# Patient Record
Sex: Female | Born: 1970
Health system: Southern US, Community
[De-identification: ages and names within clinical notes are randomized; demographics above are authoritative.]

## PROBLEM LIST (undated history)

## (undated) DIAGNOSIS — Z9889 Other specified postprocedural states: Secondary | ICD-10-CM

## (undated) DIAGNOSIS — F988 Other specified behavioral and emotional disorders with onset usually occurring in childhood and adolescence: Secondary | ICD-10-CM

## (undated) DIAGNOSIS — F419 Anxiety disorder, unspecified: Secondary | ICD-10-CM

## (undated) DIAGNOSIS — R112 Nausea with vomiting, unspecified: Secondary | ICD-10-CM

## (undated) DIAGNOSIS — C801 Malignant (primary) neoplasm, unspecified: Secondary | ICD-10-CM

## (undated) DIAGNOSIS — Z8619 Personal history of other infectious and parasitic diseases: Secondary | ICD-10-CM

## (undated) DIAGNOSIS — K219 Gastro-esophageal reflux disease without esophagitis: Secondary | ICD-10-CM

## (undated) DIAGNOSIS — D649 Anemia, unspecified: Secondary | ICD-10-CM

## (undated) DIAGNOSIS — F32A Depression, unspecified: Secondary | ICD-10-CM

## (undated) DIAGNOSIS — F411 Generalized anxiety disorder: Secondary | ICD-10-CM

## (undated) HISTORY — DX: Other specified behavioral and emotional disorders with onset usually occurring in childhood and adolescence: F98.8

## (undated) HISTORY — PX: BACK SURGERY: SHX140

## (undated) HISTORY — DX: Gastro-esophageal reflux disease without esophagitis: K21.9

## (undated) HISTORY — PX: LAMINECTOMY: SHX219

## (undated) HISTORY — DX: Personal history of other infectious and parasitic diseases: Z86.19

## (undated) HISTORY — PX: IR GUIDED LIVER TUMOR ABLATION RFA PERC: IMG933

---
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
## 2001-07-26 ENCOUNTER — Emergency Department (HOSPITAL_COMMUNITY): Admission: EM | Admit: 2001-07-26 | Discharge: 2001-07-26 | Payer: Self-pay | Admitting: Emergency Medicine

## 2001-09-11 ENCOUNTER — Other Ambulatory Visit: Admission: RE | Admit: 2001-09-11 | Discharge: 2001-09-11 | Payer: Self-pay | Admitting: Obstetrics and Gynecology

## 2002-04-28 ENCOUNTER — Other Ambulatory Visit: Admission: RE | Admit: 2002-04-28 | Discharge: 2002-04-28 | Payer: Self-pay | Admitting: Obstetrics and Gynecology

## 2002-11-20 ENCOUNTER — Inpatient Hospital Stay (HOSPITAL_COMMUNITY): Admission: AD | Admit: 2002-11-20 | Discharge: 2002-11-20 | Payer: Self-pay | Admitting: *Deleted

## 2002-12-09 ENCOUNTER — Inpatient Hospital Stay (HOSPITAL_COMMUNITY): Admission: AD | Admit: 2002-12-09 | Discharge: 2002-12-11 | Payer: Self-pay | Admitting: Obstetrics and Gynecology

## 2002-12-12 ENCOUNTER — Encounter: Admission: RE | Admit: 2002-12-12 | Discharge: 2003-01-11 | Payer: Self-pay | Admitting: Obstetrics and Gynecology

## 2003-01-21 ENCOUNTER — Other Ambulatory Visit: Admission: RE | Admit: 2003-01-21 | Discharge: 2003-01-21 | Payer: Self-pay | Admitting: Obstetrics and Gynecology

## 2003-02-10 ENCOUNTER — Encounter: Admission: RE | Admit: 2003-02-10 | Discharge: 2003-03-12 | Payer: Self-pay | Admitting: Obstetrics and Gynecology

## 2004-11-13 ENCOUNTER — Inpatient Hospital Stay (HOSPITAL_COMMUNITY): Admission: AD | Admit: 2004-11-13 | Discharge: 2004-11-15 | Payer: Self-pay | Admitting: Obstetrics & Gynecology

## 2005-08-28 ENCOUNTER — Inpatient Hospital Stay (HOSPITAL_COMMUNITY): Admission: AD | Admit: 2005-08-28 | Discharge: 2005-08-30 | Payer: Self-pay | Admitting: Obstetrics and Gynecology

## 2006-08-25 ENCOUNTER — Emergency Department (HOSPITAL_COMMUNITY): Admission: EM | Admit: 2006-08-25 | Discharge: 2006-08-25 | Payer: Self-pay | Admitting: Emergency Medicine

## 2006-08-31 ENCOUNTER — Ambulatory Visit (HOSPITAL_COMMUNITY): Admission: RE | Admit: 2006-08-31 | Discharge: 2006-08-31 | Payer: Self-pay | Admitting: Family Medicine

## 2006-09-17 ENCOUNTER — Ambulatory Visit (HOSPITAL_COMMUNITY): Admission: RE | Admit: 2006-09-17 | Discharge: 2006-09-18 | Payer: Self-pay | Admitting: Neurosurgery

## 2006-09-17 HISTORY — PX: LUMBAR LAMINECTOMY/DECOMPRESSION WITH DISCECTOMY: SHX7439

## 2007-09-01 ENCOUNTER — Emergency Department (HOSPITAL_COMMUNITY): Admission: EM | Admit: 2007-09-01 | Discharge: 2007-09-01 | Payer: Self-pay | Admitting: Family Medicine

## 2009-09-28 ENCOUNTER — Emergency Department (HOSPITAL_COMMUNITY): Admission: EM | Admit: 2009-09-28 | Discharge: 2009-09-28 | Payer: Self-pay | Admitting: Emergency Medicine

## 2009-11-06 HISTORY — PX: LAMINECTOMY AND MICRODISCECTOMY LUMBAR SPINE: SHX1913

## 2009-11-23 ENCOUNTER — Inpatient Hospital Stay (HOSPITAL_COMMUNITY): Admission: RE | Admit: 2009-11-23 | Discharge: 2009-11-23 | Payer: Self-pay | Admitting: Obstetrics

## 2010-02-16 ENCOUNTER — Encounter: Admission: RE | Admit: 2010-02-16 | Discharge: 2010-02-16 | Payer: Self-pay | Admitting: Neurosurgery

## 2010-03-29 ENCOUNTER — Encounter: Admission: RE | Admit: 2010-03-29 | Discharge: 2010-03-29 | Payer: Self-pay | Admitting: Neurosurgery

## 2010-07-25 IMAGING — CR DG LUMBAR SPINE 2-3V
3 series · 3 of 3 positions shown · non-contrast
Comparison: MRI lumbar spine of 02/16/2010

CLINICAL DATA: Severe low back and bilateral leg pain

LUMBAR SPINE - 2-3 VIEW

[w l-spine lat * (1 of 3)]
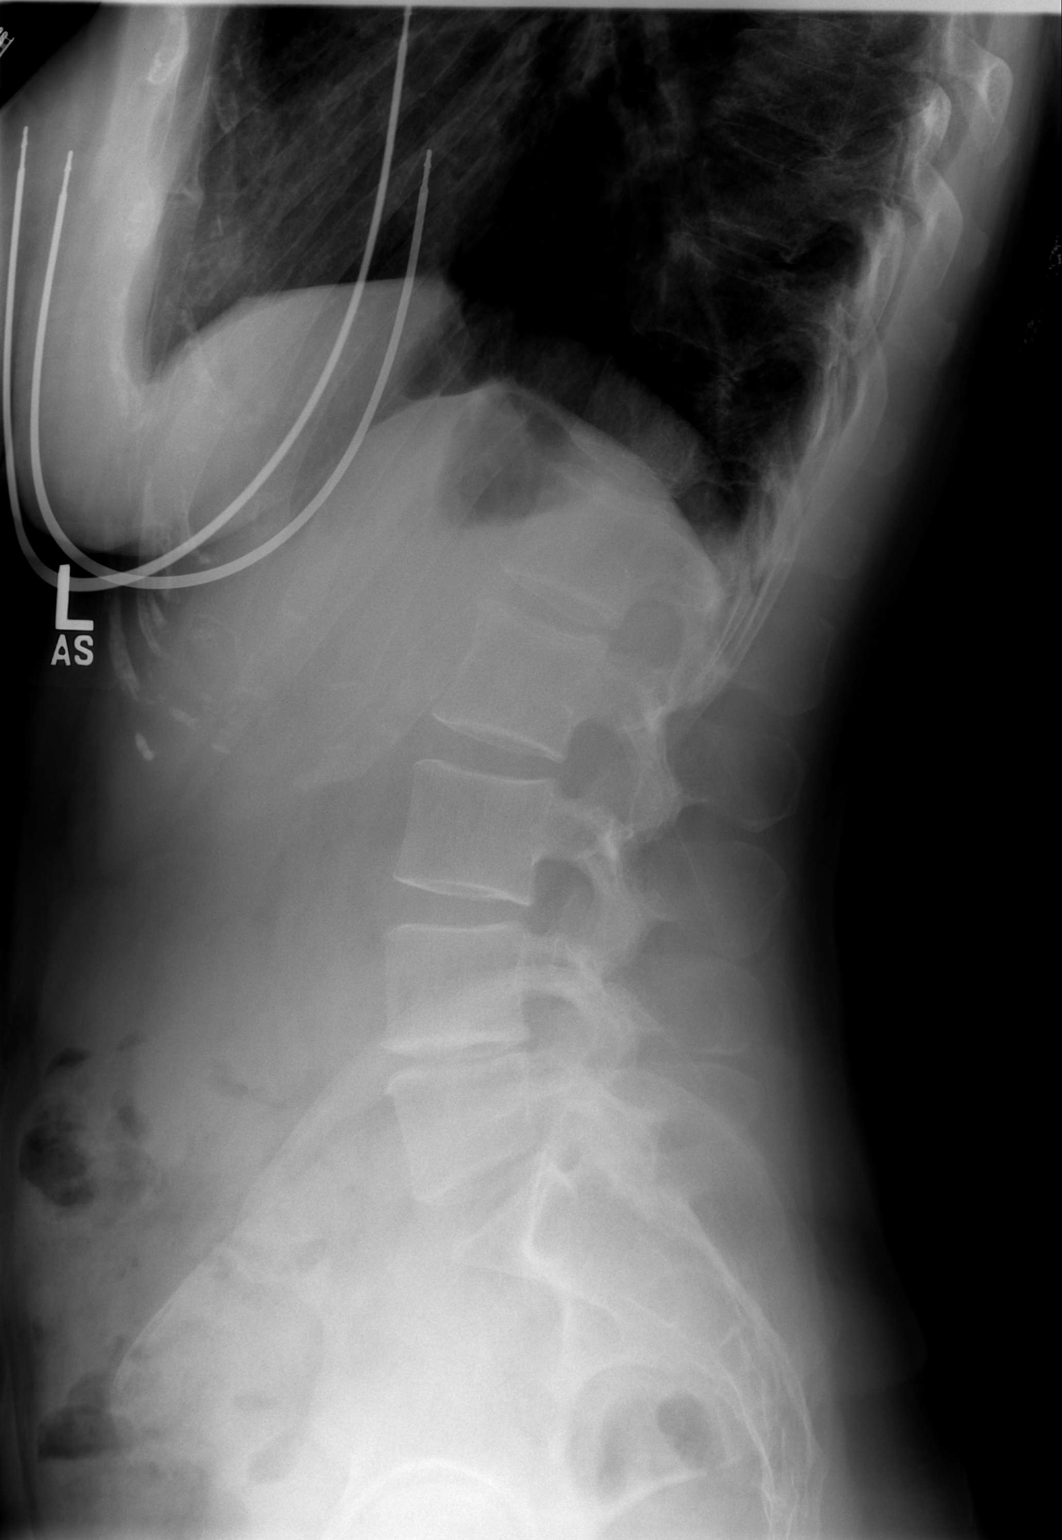

[w l-spine lat * (2 of 3)]
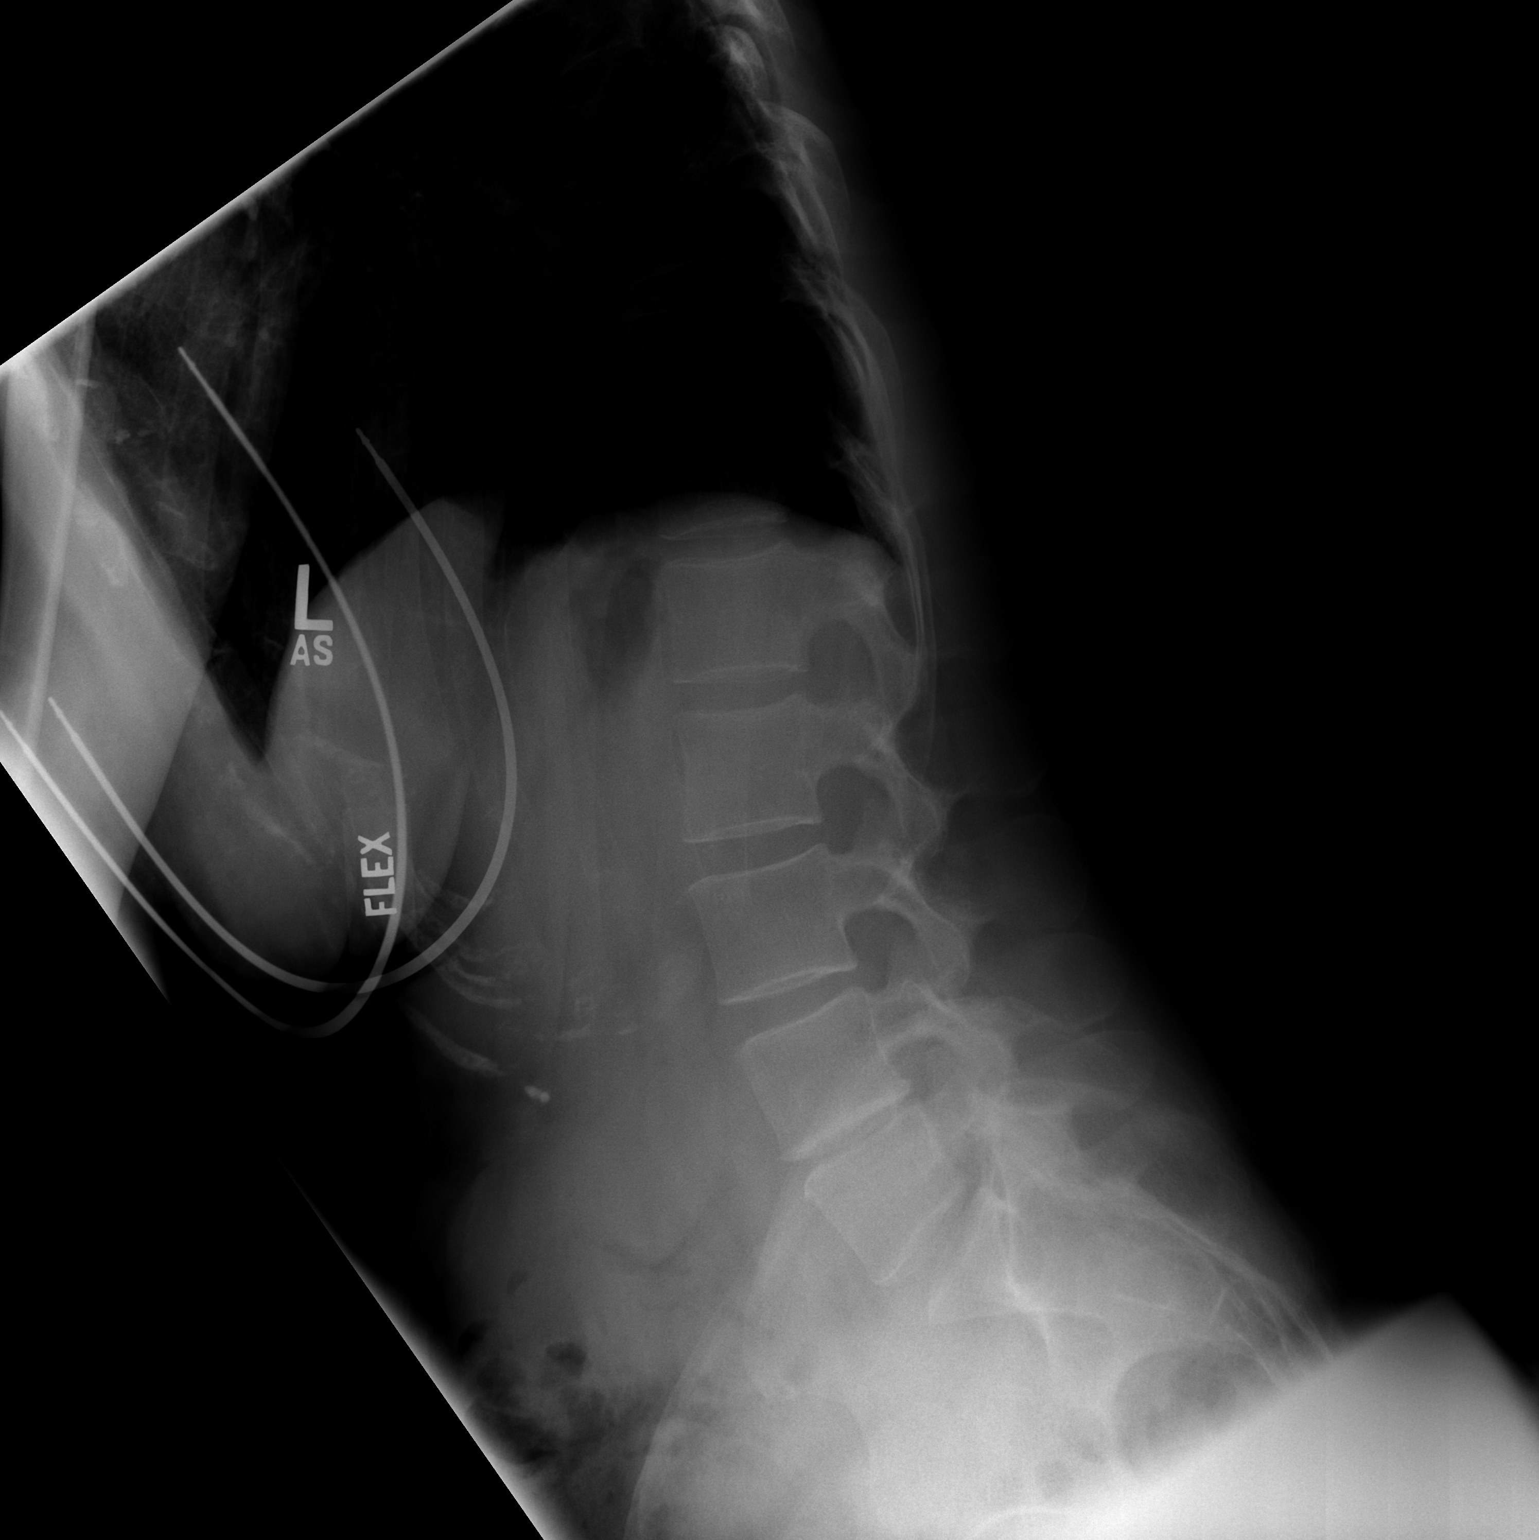

[w l-spine lat * (3 of 3)]
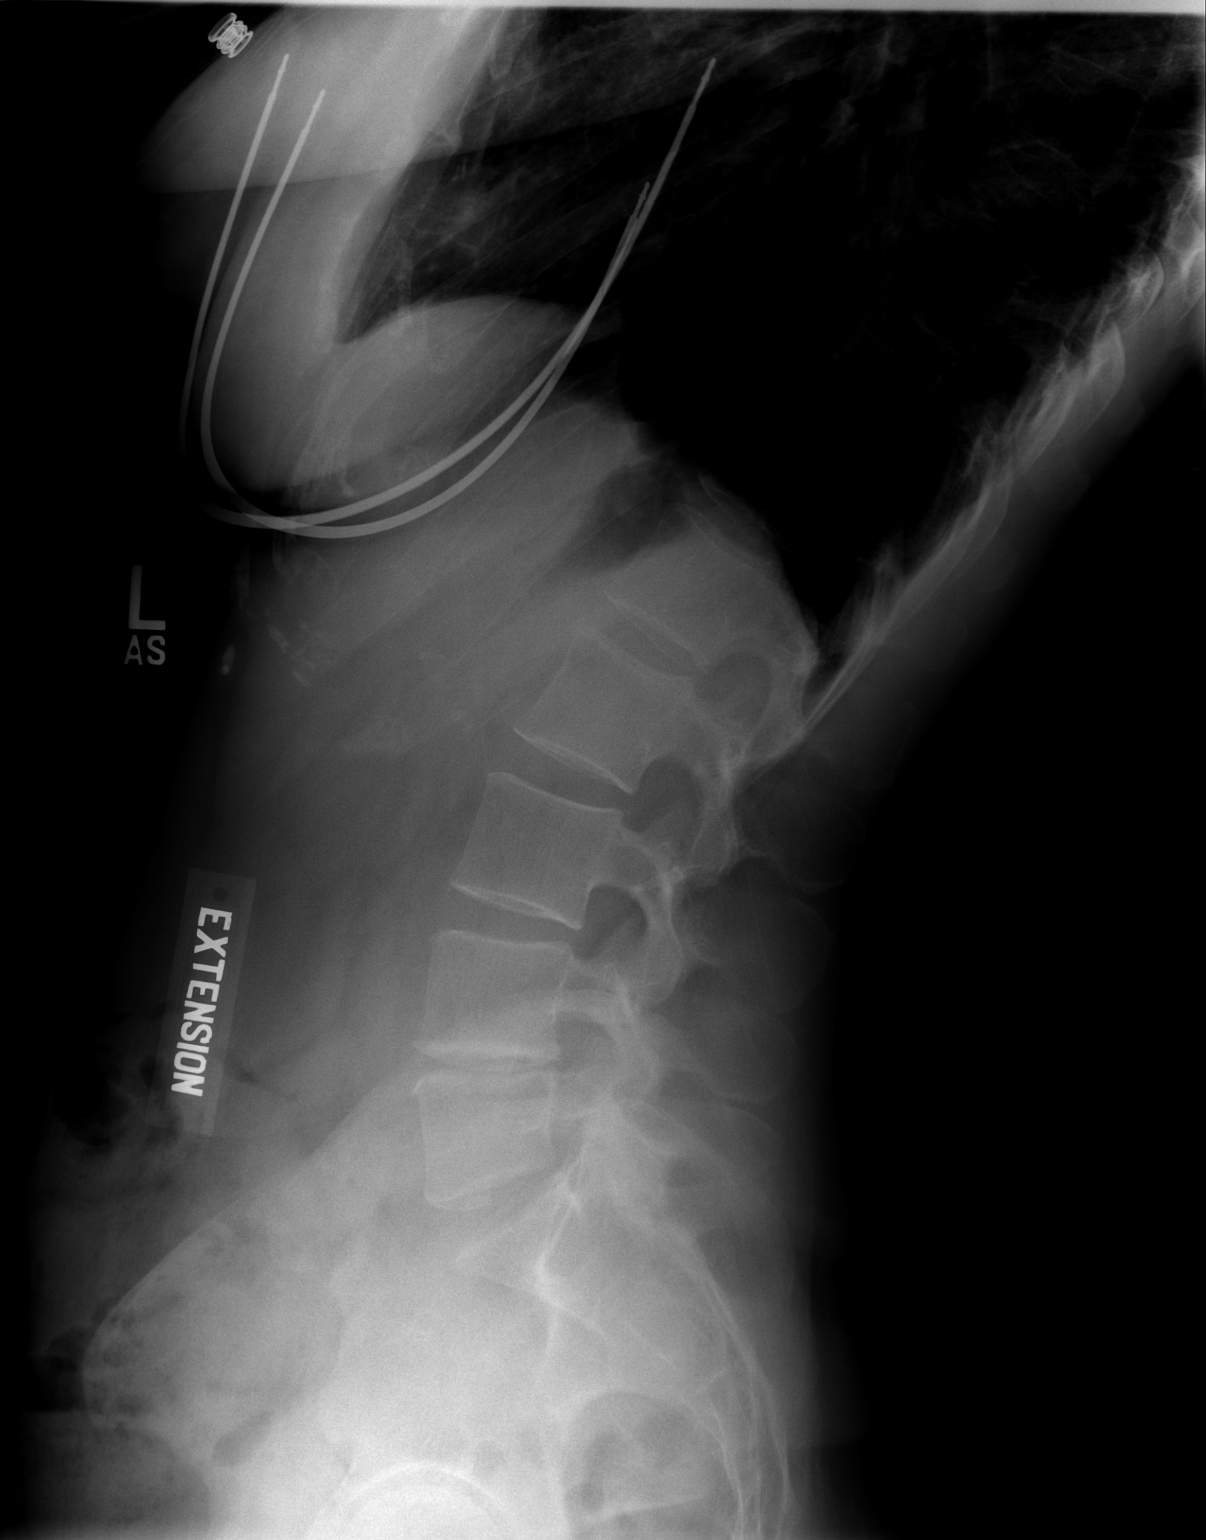

[3 of 3 positions shown; findings below may reference images not displayed]

FINDINGS: The lumbar vertebrae are in normal alignment.  There is
degenerative disc disease at L4-5 with loss of disc space,
sclerosis, and spurring.  The remainder of disc spaces appear
normal.  Through flexion and extension there is limited range of
motion.
IMPRESSION: 1.  Degenerative disc disease at L4-5.
2.  Limited range of motion through flexion and extension.  Normal
alignment.

## 2010-11-26 ENCOUNTER — Encounter: Payer: Self-pay | Admitting: Family Medicine

## 2011-01-22 LAB — URINALYSIS, ROUTINE W REFLEX MICROSCOPIC
Bilirubin Urine: NEGATIVE
Glucose, UA: NEGATIVE mg/dL
Ketones, ur: 15 mg/dL — AB
Leukocytes, UA: NEGATIVE
Nitrite: NEGATIVE
Protein, ur: NEGATIVE mg/dL
Specific Gravity, Urine: 1.01 (ref 1.005–1.030)
Urobilinogen, UA: 0.2 mg/dL (ref 0.0–1.0)
pH: 7 (ref 5.0–8.0)

## 2011-01-22 LAB — URINE MICROSCOPIC-ADD ON

## 2011-01-22 LAB — POCT PREGNANCY, URINE: Preg Test, Ur: NEGATIVE

## 2011-03-24 NOTE — Op Note (Signed)
NAME:  Ann Daugherty, Ann Daugherty NO.:  [id number]   MEDICAL RECORD NO.:  [id number]          PATIENT TYPE:  OIB   LOCATION:  3031                         FACILITY:  [facility]   PHYSICIAN:  Donalee Citrin, M.D.        DATE OF BIRTH:  29-Jun-1971   DATE OF PROCEDURE:  09/17/2006  DATE OF DISCHARGE:  09/18/2006                                 OPERATIVE REPORT   PREOPERATIVE DIAGNOSIS:  L5 radiculopathy from ruptured disk, L4-5 right.   POSTOPERATIVE DIAGNOSIS:  L5 radiculopathy from ruptured disk, L4-5 right.   PROCEDURE:  Lumbar laminectomy with microdiskectomy, L4-5 right with  microscopic dissection of the right L5 nerve root with microscopic  diskectomy.   SURGEON:  Donalee Citrin, MD   ANESTHESIA:  General endotracheal.   OPERATIVE INDICATIONS:  This patient is a very pleasant 40 year old female  who has had back and right leg pain progressively worsening over the last  several weeks with radiation down the posterior aspect of her right thigh  into the top of the foot and big toe with weakness on her right EHL.  The  patient had been through two courses of oral steroids with only temporary  relief and the patient had persistent weakness of the right big toe and was  recommended subsequent microdiskectomy after the MRI scan showed a very  large disk herniation causing severe compression of the right L5 nerve root.  Risks and benefits of the operation were explained to the patient and she  understands and agrees to proceed.   DESCRIPTION OF PROCEDURE:  The patient was brought to the operating room  where after induction of general anesthesia, the patient was positioned  prone on the Wilson frame, back scrubbed and draped in usual sterile  fashion.  On preoperative x-ray, I localized the L4 spinous process so after  infiltration of 10 mL of lidocaine with epinephrine, a midline incision was  made and a Bovie electrocautery was used to take down the subcutaneous  tissues.   Subperiosteal dissection was carried out at what was believed to  be the L4-5 lamina.  However, intraoperative x-rays showed this to be the L5-  S1 lamina, so attention was taken one interspace above this.  The retractor  was repositioned and the superior aspect of L5 was removed in a piecemeal  fashion.  This exposed the ligamentum flavum which was dissected off the  dura with a 4 Penfield and removed in piecemeal fashion with a 3-mm Kerrison  punch.  At this point, the operating microscope was draped, brought into the  field, and under microscopic illumination, the remainder of the lateral dura  was dissected off of the lateral gutter and medial facet complex and the  undersurface of the gutter and the ligament was underpinned with a 2-mm  Kerrison punch. The L5 pedicle was identified; the L5 nerve root was  identified up against the pedicle and the L5 neural foramen was unroofed.  At this point, a 4 Nicholos Johns was used to dissect the L5 nerve root off of the  very large bulging disk at the level of the disk  space.  Annulotomy was done  with an #11 blade scalpel and this was cleaned out.  Then attention was  taken inferiorly at the level of the pedicle and free fragment disk was  appreciated.  After the ligament was incised, a very large fragment came out  in one piece right underneath the L5 nerve root inferior to the interspace  at the level of the pedicle.  This all came out in one big piece and the L5  root was noted to be markedly more mobile after removal.  Then a coronary  dilator and hockey stick was noted to fill out the neural foramen both  medially, inferiorly and laterally and there were no further fragments  appreciated.  Then attention was taken back to the disk space.  Using a  downgoing Epstein curet and pituitary rongeurs, disk space was further  cleaned out.  The annulus prolapsed back into the disk space and was noted  to be markedly decompressed.  At the end of the  diskectomy, there was no  further stenosis, no further disk fragments appreciated.  The foramen was  widely patent and the L5 nerve root was easily mobilizeable, so it was  copiously irrigated and meticulous hemostasis was maintained.  Gelfoam was  laid over the dura and the wound was closed in layers with a #3 Vicryl and a  running 4-0 subcuticular in the skin.  Benzoin and Steri-strips applied.  The patient went to the recovery room in stable condition.  At the end of  the case, instrument counts and sponge counts were correct.           ______________________________  Donalee Citrin, M.D.     GC/MEDQ  D:  09/17/2006  T:  09/18/2006  Job:  [id number]

## 2011-03-24 NOTE — Discharge Summary (Signed)
Ann Daugherty, ROUTSON NO.:  [id number]   MEDICAL RECORD NO.:  [id number]          PATIENT TYPE:  INP   LOCATION:  9319                          FACILITY:  [facility]   PHYSICIAN:  Genia Del, M.D.DATE OF BIRTH:  1971-10-09   DATE OF ADMISSION:  11/13/2004  DATE OF DISCHARGE:  11/15/2004                                 DISCHARGE SUMMARY   ADMISSION DIAGNOSIS:  Right pelvic pain.  Possible ectopic pregnancy versus  very early intrauterine pregnancy with corpus luteum cyst.  Desired  pregnancy.  Stable patient.   DISCHARGE DIAGNOSIS:  Right pelvic pain.  Possible ectopic pregnancy versus  very early intrauterine pregnancy with corpus luteum cyst.  Desired  pregnancy.  Stable patient.   HOSPITAL COURSE:  Patient remained hemodynamically stable throughout her  hospital course.  Her pelvic ultrasound showed a right ovary surrounded by  echogenic rim with moderate hemoperitoneum.  No intrauterine pregnancy seen.  The beta hCG quant was at 1010 on admission.  Her hemoglobin was 10.1 with a  hematocrit at 29.1.  STD screening was done and was negative.  Rh was  positive.  Repeat quant was performed, it was going down.  On discharge  11/15/04 it was down at 450.  A repeat ultrasound was also done on 11/15/04,  and at that point there was only a mild increase in free fluid, otherwise it  was stable.  Given the decrease in beta hCG with a stable patient, the  decision was taken to discharge her home with close followup.  Serial beta  hCGs will be done to assure that they go down to negative.  The patient was  advised to call our office for any increase in pain or shoulder pains or  dizziness or abnormal bleeding.  Ectopic precautions were given.      ML/MEDQ  D:  12/03/2004  T:  12/03/2004  Job:  [id number]

## 2011-03-24 NOTE — Op Note (Signed)
   NAME:  BLIMY, NAPOLEON                       ACCOUNT NO.:  [id number]   MEDICAL RECORD NO.:  [id number]                   PATIENT TYPE:  INP   LOCATION:  9134                                 FACILITY:  [facility]   PHYSICIAN:  Lenoard Aden, M.D.             DATE OF BIRTH:  Jun 11, 1971   DATE OF PROCEDURE:  12/09/2002  DATE OF DISCHARGE:                                 OPERATIVE REPORT   DELIVERY NOTE:   INDICATION FOR OPERATIVE DELIVERY:  Fetal bradycardia.   DESCRIPTION OF PROCEDURE:  After noting fetal bradycardia ranging in the 70-  90 beat per minute range for approximately five minutes with occasional  return to baseline in the 110-130 beat per minute range, the fetal vertex  was noted to be +3, ROA less than 25 degrees.  The mushroom vacuum cup  explained and shown to the patient and husband.  They acknowledged the risks  of scalp laceration, cephalohematoma, and small incidence of intracranial  hemorrhage.  The Mityvac mushroom cup was placed for three pulls with two  pop-offs previous to this for a full-term living female over a second degree  midline laceration.  Mild shoulder dystocia was encountered and relieved  with McRoberts maneuver without difficulty.  Placenta was delivered  spontaneously intact.  Fetal Apgars of 8 and 9 are noted.  No vaginal wall  lacerations were noted.  A midline second degree laceration repaired with a  3-0 Vicryl Rapide without difficulty.  Estimated blood loss 300 mL.  No  complications noted.  Mother and baby tolerated the procedure well and are  recovering in good condition.                                               Lenoard Aden, M.D.    RJT/MEDQ  D:  12/09/2002  T:  12/10/2002  Job:  [id number]

## 2011-03-24 NOTE — H&P (Signed)
   NAME:  Ann Daugherty, Ann Daugherty                       ACCOUNT NO.:  [id number]   MEDICAL RECORD NO.:  [id number]                   PATIENT TYPE:  INP   LOCATION:  9165                                 FACILITY:  [facility]   PHYSICIAN:  Lenoard Aden, M.D.             DATE OF BIRTH:  May 03, 1971   DATE OF ADMISSION:  12/09/2002  DATE OF DISCHARGE:                                HISTORY & PHYSICAL   CHIEF COMPLAINT:  SGA versus intrauterine growth retardation.   HISTORY OF PRESENT ILLNESS:  This 40 year old white female, G 3, P 0, EDC  December 13, 2002, at 39+ weeks and an estimated fetal weight of just above  the 10th percentile, who presents for induction due to borderline SGA versus  IUGR.   PAST OBSTETRIC HISTORY:  Remarkable for a TAB x2.  No other medical or  surgical hospitalizations except for wisdom teeth removal.   FAMILY HISTORY:  1. Coronary vascular disease.  2. Uterine cancer.  3. Kidney disease.  4. Thyroid disease.  5. Heart disease.   LABORATORY DATA:  Reveals a blood type of O-positive, Rh antibody negative.  Hepatitis-B surface antigen negative.  HIV nonreactive.  GBS is negative.   PHYSICAL EXAMINATION:  GENERAL:  She is a well-nourished, well-developed  white female, in no apparent distress.  HEENT:  Normal.  LUNGS:  Clear.  HEART:  Regular rhythm.  ABDOMEN:  Soft, gravid, nontender.  PELVIC:  Cervix revealed to be 2-3 cm, 70% vertex, -1.  EXTREMITIES:  No abnormalities.  NEUROLOGIC:  Nonfocal.   IMPRESSION:  1. A 39-week intrauterine pregnancy.  2. Small for gestational age versus intrauterine growth retardation.   PLAN:  To proceed with induction.                                                Lenoard Aden, M.D.   RJT/MEDQ  D:  12/09/2002  T:  12/09/2002  Job:  [id number]

## 2013-11-06 DIAGNOSIS — F988 Other specified behavioral and emotional disorders with onset usually occurring in childhood and adolescence: Secondary | ICD-10-CM

## 2013-11-06 HISTORY — DX: Other specified behavioral and emotional disorders with onset usually occurring in childhood and adolescence: F98.8

## 2014-01-23 ENCOUNTER — Emergency Department (HOSPITAL_COMMUNITY)
Admission: EM | Admit: 2014-01-23 | Discharge: 2014-01-23 | Disposition: A | Discharge status: Home / Self Care | Payer: BC Managed Care – PPO | Source: Home / Self Care

## 2014-01-23 ENCOUNTER — Emergency Department (HOSPITAL_COMMUNITY)
Admission: EM | Admit: 2014-01-23 | Discharge: 2014-01-23 | Disposition: A | Discharge status: Home / Self Care | Payer: BC Managed Care – PPO | Attending: Emergency Medicine | Admitting: Emergency Medicine

## 2014-01-23 ENCOUNTER — Encounter (HOSPITAL_COMMUNITY): Payer: Self-pay | Admitting: Emergency Medicine

## 2014-01-23 DIAGNOSIS — F17200 Nicotine dependence, unspecified, uncomplicated: Secondary | ICD-10-CM | POA: Insufficient documentation

## 2014-01-23 DIAGNOSIS — Z9889 Other specified postprocedural states: Secondary | ICD-10-CM | POA: Insufficient documentation

## 2014-01-23 DIAGNOSIS — M79605 Pain in left leg: Secondary | ICD-10-CM

## 2014-01-23 DIAGNOSIS — M79609 Pain in unspecified limb: Secondary | ICD-10-CM | POA: Insufficient documentation

## 2014-01-23 DIAGNOSIS — M791 Myalgia, unspecified site: Secondary | ICD-10-CM

## 2014-01-23 NOTE — ED Notes (Signed)
Pt states left leg has been hurting for 10 days.  No redness or swelling.  Pt concerned for DVT bc she smokes and sits long hours.  Pt unsure what caused pain.  No trauma noted.

## 2014-01-23 NOTE — ED Provider Notes (Signed)
CSN: [id number]     Arrival date & time 01/23/14  1122 History   First MD Initiated Contact with Patient 01/23/14 1128     Chief Complaint  Patient presents with  . Leg Pain     (Consider location/radiation/quality/duration/timing/severity/associated sxs/prior Treatment) The history is provided by the patient. No language interpreter was used.  Ann Daugherty is a 43 y/o F with PMHx of back surgery with two surgical laminectomies performed presenting to the ED with left calf pain that has been ongoing for the past 10 days - reported that the left calf feels full intermittently and notices "discomfort" when walking. Reported that the pain stays within the calf without radiation. Reported that she has not been taking anything for pain. Reported that she sits at work for 12 hours with getting up intermittently to walk around and smoke cigarettes one ppd. Patient reported that she is nervous that she has a blood clot in her leg. Denied injury, falls, swelling, redness, fevers, warmth upon palpation, surgery, ankle swelling, changes to colors of the ankle, chest pain, shortness of breath, difficulty breathing, estrogen use, blood clots, hemoptysis. PCP [facility]  History reviewed. No pertinent past medical history. Past Surgical History  Procedure Laterality Date  . Back surgery    . Laminectomy     History reviewed. No pertinent family history. History  Substance Use Topics  . Smoking status: Current Every Day Smoker -- 0.50 packs/day    Types: Cigarettes  . Smokeless tobacco: Not on file  . Alcohol Use: No   OB History   Grav Para Term Preterm Abortions TAB SAB Ect Mult Living                 Review of Systems  Constitutional: Negative for fever and chills.  Respiratory: Negative for chest tightness and shortness of breath.   Cardiovascular: Negative for chest pain and leg swelling.  Musculoskeletal: Positive for myalgias (left calf pain ). Negative for back pain.   Neurological: Negative for dizziness and weakness.  All other systems reviewed and are negative.      Allergies  Review of patient's allergies indicates no known allergies.  Home Medications   Current Outpatient Rx  Name  Route  Sig  Dispense  Refill  . acetaminophen (TYLENOL) 500 MG tablet   Oral   Take 500 mg by mouth every 6 (six) hours as needed for mild pain.         . calcium carbonate (TUMS - DOSED IN MG ELEMENTAL CALCIUM) 500 MG chewable tablet   Oral   Chew 2 tablets by mouth as needed for indigestion or heartburn.         . Multiple Vitamin (MULTIVITAMIN WITH MINERALS) TABS tablet   Oral   Take 2 tablets by mouth daily.          BP 118/61  Pulse 85  Temp(Src) 98.4 F (36.9 C) (Oral)  Resp 18  SpO2 100%  LMP 01/16/2014 Physical Exam  Nursing note and vitals reviewed. Constitutional: She is oriented to person, place, and time. She appears well-developed and well-nourished. No distress.  HENT:  Head: Normocephalic and atraumatic.  Eyes: Conjunctivae and EOM are normal. Right eye exhibits no discharge. Left eye exhibits no discharge.  Neck: Normal range of motion. Neck supple.  Cardiovascular: Normal rate, regular rhythm and normal heart sounds.  Exam reveals no friction rub.   No murmur heard. Pulses:      Radial pulses are 2+ on the right side,  and 2+ on the left side.       Dorsalis pedis pulses are 2+ on the right side, and 2+ on the left side.  Cap refill < 3 seconds Negative swelling or pitting edema noted to the lower extremities bilaterally  Negative Homan's sign noted to the left lower extremity  Pulmonary/Chest: Effort normal and breath sounds normal. No respiratory distress. She has no wheezes. She has no rales.  Musculoskeletal: Normal range of motion.  Mild discomfort upon palpation to the upper portion of the left calf, medial aspect Full ROM to upper and lower extremities without difficulty noted, negative ataxia noted.  Neurological:  She is alert and oriented to person, place, and time. No cranial nerve deficit. She exhibits normal muscle tone. Coordination normal.  Strength intact to the digits of the left foot  Skin: Skin is warm and dry. No rash noted. She is not diaphoretic. No erythema.  Psychiatric: She has a normal mood and affect. Her behavior is normal. Thought content normal.    ED Course  Procedures (including critical care time)  12:37 PM This provider was made aware by the vascular tech that the doppler was negative for DVT, superficial thromboses , and baker's cyst.   Labs Review Labs Reviewed - No data to display Imaging Review No results found.   EKG Interpretation None      MDM   Final diagnoses:  Left leg pain  Muscle pain   Filed Vitals:   01/23/14 1132  BP: 118/61  Pulse: 85  Temp: 98.4 F (36.9 C)  TempSrc: Oral  Resp: 18  SpO2: 100%    Patient presenting to the ED with left calf pain that has been ongoing for the past 10 days. Patient reported that she has been having a "discomfort" to her left calf when walking and a fullness within her left calf. Stated that she is nervous that she has a blood clot in her legs. Stated that she smokes one pack of cigarettes per day and sits at work for 12 hour with intermittently getting up and walking around. Denied chest pain, shortness of breath, difficulty breathing, swelling to the lower extremities, changes to colors, falls, injuries, surgeries. Alert oriented. GCS 15. Heart rate and rhythm normal. Lungs clear to auscultation to upper and lower lobes bilaterally. Cap refill less than 3 seconds. Radial and DP pulses 2+ bilaterally negative swelling, leg length discrepancy, redness, erythema, inflammation, warmth upon palpation noted to the left calf. Negative asymmetry noted to the legs. Negative medial ulcers identified. Mild discomfort upon palpation to the medial aspect of the upper left calf. Strength intact to digits of the left foot. Lower  extremity venous Doppler negative for DVT, superficial thromboses and baker cyst. Doubt a blood clot. Doubt muscular tear. Suspicion to be muscular pain secondary to pain upon palpation and pain with walking-negative findings of DVT or thromboses. Patient stable, afebrile. Patient not septic appearing. Discharged patient. Discussed with patient to rest, ice. Discussed with patient to massage with icy hot ointment. Discussed with patient to walk around and become more active-increase in exercise. Discussed with patient to decrease smoking - discussed smoking cessation to Discussed with patient to closely monitor symptoms and if symptoms are to worsen or change to report back to the ED - strict return instructions given.  Patient agreed to plan of care, understood, all questions answered.   Jamse Mead, PA-C 01/23/14 2221

## 2014-01-23 NOTE — Progress Notes (Signed)
VASCULAR LAB PRELIMINARY  PRELIMINARY  PRELIMINARY  PRELIMINARY  Left lower extremity venous duplex completed.    Preliminary report:  Left:  No evidence of DVT, superficial thrombosis, or Baker's cyst.  Ann Daugherty, RVS 01/23/2014, 12:36 PM

## 2014-01-23 NOTE — Discharge Instructions (Signed)
Please call your doctor for a followup appointment within 24-48 hours. When you talk to your doctor please let them know that you were seen in the emergency department and have them acquire all of your records so that they can discuss the findings with you and formulate a treatment plan to fully care for your new and ongoing problems. Please rest and stay hydrated Please massage the calf with icy hot ointment Please increase exercise and stretching While at work please get up and walk around for at least 5-10 minutes every hour Please avoid any physical or strenuous activity Please decreased smoking for this can lead to numerous issues such as lung cancer, heart disease, increase in blood clots and other health issues. Please continue to monitor symptoms closely if symptoms are to worsen or change (fever greater than 101, chills, sweating, nausea, vomiting, swelling to the leg, redness to the leg, inability to apply pressure to the leg, chest pain, shortness of breath, difficulty breathing, fall, injury, numbness, tingling, changes to colors of the leg) please report back to the ED immediately  Myalgia, Adult Myalgia is the medical term for muscle pain. It is a symptom of many things. Nearly everyone at some time in their life has this. The most common cause for muscle pain is overuse or straining and more so when you are not in shape. Injuries and muscle bruises cause myalgias. Muscle pain without a history of injury can also be caused by a virus. It frequently comes along with the flu. Myalgia not caused by muscle strain can be present in a large number of infectious diseases. Some autoimmune diseases like lupus and fibromyalgia can cause muscle pain. Myalgia may be mild, or severe. SYMPTOMS  The symptoms of myalgia are simply muscle pain. Most of the time this is short lived and the pain goes away without treatment. DIAGNOSIS  Myalgia is diagnosed by your caregiver by taking your history. This means  you tell him when the problems began, what they are, and what has been happening. If this has not been a long term problem, your caregiver may want to watch for a while to see what will happen. If it has been long term, they may want to do additional testing. TREATMENT  The treatment depends on what the underlying cause of the muscle pain is. Often anti-inflammatory medications will help. HOME CARE INSTRUCTIONS  If the pain in your muscles came from overuse, slow down your activities until the problems go away.  Myalgia from overuse of a muscle can be treated with alternating hot and cold packs on the muscle affected or with cold for the first couple days. If either heat or cold seems to make things worse, stop their use.  Apply ice to the sore area for 15-20 minutes, 03-04 times per day, while awake for the first 2 days of muscle soreness, or as directed. Put the ice in a plastic bag and place a towel between the bag of ice and your skin.  Only take over-the-counter or prescription medicines for pain, discomfort, or fever as directed by your caregiver.  Regular gentle exercise may help if you are not active.  Stretching before strenuous exercise can help lower the risk of myalgia. It is normal when beginning an exercise regimen to feel some muscle pain after exercising. Muscles that have not been used frequently will be sore at first. If the pain is extreme, this may mean injury to a muscle. SEEK MEDICAL CARE IF:  You have an  increase in muscle pain that is not relieved with medication.  You begin to run a temperature.  You develop nausea and vomiting.  You develop a stiff and painful neck.  You develop a rash.  You develop muscle pain after a tick bite.  You have continued muscle pain while working out even after you are in good condition. SEEK IMMEDIATE MEDICAL CARE IF: Any of your problems are getting worse and medications are not helping. MAKE SURE YOU:   Understand these  instructions.  Will watch your condition.  Will get help right away if you are not doing well or get worse. Document Released: 09/14/2006 Document Revised: 01/15/2012 Document Reviewed: 12/04/2006 [facility] Patient Information 2014 [place], [place].   Emergency Department Resource Guide 1) Find a Doctor and Pay Out of Pocket Although you won't have to find out who is covered by your insurance plan, it is a good idea to ask around and get recommendations. You will then need to call the office and see if the doctor you have chosen will accept you as a new patient and what types of options they offer for patients who are self-pay. Some doctors offer discounts or will set up payment plans for their patients who do not have insurance, but you will need to ask so you aren't surprised when you get to your appointment.  2) Contact Your Local Health Department Not all health departments have doctors that can see patients for sick visits, but many do, so it is worth a call to see if yours does. If you don't know where your local health department is, you can check in your phone book. The CDC also has a tool to help you locate your state's health department, and many state websites also have listings of all of their local health departments.  3) Find a [facility] If your illness is not likely to be very severe or complicated, you may want to try a walk in clinic. These are popping up all over the country in pharmacies, drugstores, and shopping centers. They're usually staffed by nurse practitioners or physician assistants that have been trained to treat common illnesses and complaints. They're usually fairly quick and inexpensive. However, if you have serious medical issues or chronic medical problems, these are probably not your best option.  No Primary Care Doctor: - Call Health Connect at  [phone] - they can help you locate a primary care doctor that  accepts your insurance, provides certain services,  etc. - Physician Referral Service- [phone]  Chronic Pain Problems: Organization         Address  Phone   Notes  [facility]  [phone] Patients need to be referred by their primary care doctor.   Medication Assistance: Organization         Address  Phone   Notes  [facility] Medication [facility] [address] [place], [place] [address] [phone] --Must be a resident of [facility] -- Must have NO insurance coverage whatsoever (no Medicaid/ Medicare, etc.) -- The pt. MUST have a primary care doctor that directs their care regularly and follows them in the community   MedAssist  [phone]   [employer]  [phone]    Agencies that provide inexpensive medical care: Organization         Address  Phone   Notes  [place]  [phone]   [facility]    [phone]  [facility] [place], [place] [address] [phone]   [place]. [address] [place] [phone]   Planned Parenthood    [phone]   [facility]    [phone]   [place] and [place] [address], [place] Phone:  [phone], Fax:  [phone] Hours of Operation:  9 am - 6 pm, M-F.  Also accepts Medicaid/Medicare and self-pay.  [facility] for [place] [address], [address], [place] Phone: [phone], Fax: [phone]. Hours of Operation:  8:30 am - 5:30 pm, M-F.  Also accepts Medicaid and self-pay.  [facility] [address], [place] Phone: [phone]   [place], [place], [place] [phone], Ext. [phone] Mondays & Thursdays: 7-9 AM.  First 15 patients are seen on a first come, first serve basis.    [place] Providers:  Organization         Address  Phone   Notes  [facility] [address], [place] [phone] Also accepts self-pay patients.  [facility] [id number] [place], [place]  [phone]   [place], [address], [place] [phone]   [facility] [address], [place] [phone]   Lucianne Lei [address], [place]   [phone] Only accepts [place] Access [place] patients after they have their name applied to their card.   Self-Pay (no insurance) in [facility]:  Organization         Address  Phone   Notes  Sickle Cell Patients, [facility] [place] [phone]   [facility] [place] [phone]   [facility] [place]  [place], [place], [place] [phone]   [facility]/Dr. Osei-Bonsu  [address] [place] or [address], [place] [phone] Phone number for both [place] and [place] locations is the same.  [facility]gent [facility] and [facility] [address], [place] [phone]   [facility] [address], [place] or [address] [phone]   [facility] [address] [place] [phone], phone; [phone], fax Sees patients 1st and 3rd Saturday of every month.  Must not qualify for public or private insurance (i.e. Medicaid, Medicare, [place] Health Choice, Veterans' Benefits)  Household income should be no more than 200% of the poverty level The clinic cannot treat you if you are pregnant or think you are pregnant  Sexually transmitted diseases are not treated at the clinic.    Dental Care: Organization         Address  Phone  Notes  [facility] Department of [facility] [place] [phone] Accepts children up to  age 58 who are enrolled in [place] or [place]; pregnant women with a Medicaid card; and children who have applied for Medicaid or [place] Health Choice, but were declined, whose parents can pay a reduced fee at time of service.  [facility] of [facility]  [address], [place] [phone] Accepts children up to age 59 who  are enrolled in Medicaid or [place] Health Choice; pregnant women with a Medicaid card; and children who have applied for Medicaid or [place] Health Choice, but were declined, whose parents can pay a reduced fee at time of service.  [place] Adult [facility] Access PROGRAM  [place] [phone] Patients are seen by appointment only. Walk-ins are not accepted. [place] will see patients 73 years of age and older. Monday - Tuesday (8am-5pm) Most Wednesdays (8:30-5pm) $30 per visit, cash only  [facility] Adult Dental Access PROGRAM  [address], [facility] [phone] Patients are seen by appointment only. Walk-ins are not accepted. [place] will see patients 62 years of age and older. One Wednesday Evening (Monthly: Volunteer Based).  $30 per visit, cash only  [place]  [phone] for adults; Children under age 22, call Graduate Pediatric Dentistry at [phone]. Children aged 64-14, please call [phone] to request a pediatric application.  Dental services are provided in all areas of dental care including fillings, crowns and bridges, complete and partial dentures, implants, gum treatment, root canals, and extractions. Preventive care is also provided. Treatment is provided to both adults and children. Patients are selected via a lottery and there is often a waiting list.   [facility] [address] [place]  [phone] [email or web address]   [facility] [address] [place], [place] [phone], Ext. [phone] Second and Fourth Thursday of  each month, opens at 6:30 AM; Clinic ends at 9 AM.  Patients are seen on a first-come first-served basis, and a limited number are seen during each clinic.   [facility]  [address] [place] D[place]er [place], [place] [phone]   Eligibility Requirements You must have lived in [place], [place], or [place] counties for at least the last three months.   You cannot be eligible for state or federal sponsored [employer], including [employer], [place], or [employer].   You generally cannot be eligible for healthcare insurance through your employer.    How to apply: Eligibility screenings are held every Tuesday and Wednesday afternoon from 1:00 pm until 4:00 pm. You do not need an appointment for the interview!  [facility] [address], [place], [place]   [place]  [place] Department  [place]  [phone]    Behavioral Health Resources in the Community: Intensive Outpatient Programs Organization         Address  Phone  Notes  [place]. [address] [place], [place] [phone]   [facility] [address], [place], [place]   ADS: [facility] Drug [facility] [address], [place], [place]   [place] [address]  [place], [place] or [phone]   Substance Abuse Resources Organization         Address  Phone  Notes  Alcohol and Drug Services  [phone]   [place]  [phone]   The [place]   Chinita Pester  [phone]   Residential & Outpatient Substance Abuse Program  [phone]   Psychological Services Organization         Address  Phone  Notes  [facility]  [place]  [phone]   [place] [address],  [place] [phone] or [phone]    Mobile Crisis Teams Organization         Address  Phone  Notes  [facility], Mobile Crisis Care Unit  [phone]   [facility]  [address] [place], [place]   [place] [address], [place] [phone]    Self-Help/Support Groups Organization         Address  Phone             Notes  [place]. of [place] - variety of support groups  [place] Call for more information  Narcotics Anonymous (NA), [facility] [address], [employer] [place]  2 meetings at this location   Special educational needs teacher         Address  Phone  Notes  [facility] [place],    [place]  [phone]   [facility]  [address] [place] [id number], [place], [place]   [place], [place] [phone] Admissions: 8am-3pm M-F  Incentives Substance [place] [address],    [place], [place] [id number]   [facility] [address] [place], [place], [place]   [facility] [address]  [place], [place]   [facility] Programs - Intensive Outpatient [address], [place], [place]   [facility] [address].,  [place], [place] [phone] or [phone]   [facility] (RTS) [address] [place], [place] Accepts Medicaid  [facility] [place] [address]  [place] [phone] Substance Abuse/Addiction Treatment   [facility] Organization         Address  Phone  Notes  [facility]  [phone]   Domenic Schwab, PhD [address] Arlis Porta [place], [place]   [phone] or [phone]   [facility] [place], [place] [phone]     [facility] [address], [place], [place] [phone] Insurance/Medicaid/sponsorship through [facility] and Families [address], [address] [place]                                    [place], [place] [phone] [place] [address], [place] [phone]    Dr. Adele Schilder  [phone]   [facility] Dept. 1) [address], [place] 2) [place] 3)  [place] [address], [place] [phone]  [phone]   [place] [phone] or [phone] (After Hours)

## 2014-01-28 NOTE — ED Provider Notes (Signed)
Medical screening examination/treatment/procedure(s) were performed by non-physician practitioner and as supervising physician I was immediately available for consultation/collaboration.   EKG Interpretation None        Wandra Arthurs, MD 01/28/14 [phone]

## 2014-03-13 ENCOUNTER — Emergency Department (HOSPITAL_COMMUNITY)
Admission: EM | Admit: 2014-03-13 | Discharge: 2014-03-13 | Disposition: A | Discharge status: Home / Self Care | Payer: BC Managed Care – PPO | Attending: Emergency Medicine | Admitting: Emergency Medicine

## 2014-03-13 ENCOUNTER — Telehealth: Payer: Self-pay | Admitting: *Deleted

## 2014-03-13 NOTE — Telephone Encounter (Signed)
Patient called (she is not our patient) to see if she comes in to be seen if we can refer her for an MRI Please call [phone]

## 2014-03-13 NOTE — ED Provider Notes (Signed)
Medical screening examination/treatment/procedure(s) were performed by non-physician practitioner and as supervising physician I was immediately available for consultation/collaboration.   EKG Interpretation None        Wandra Arthurs, MD 03/13/14 1500

## 2014-03-13 NOTE — ED Provider Notes (Signed)
Pt left prior to triage.  I did not see or evaluate patient.  Larene Pickett, PA-C 03/13/14 1434

## 2014-03-13 NOTE — Telephone Encounter (Signed)
Patient has hx of lumbar issues. She has been hurting and thinks she has a new herniated disc.  She asked if we order MRI's.  She has a neuro appointment scheduled on Tuesday, but would like to have MRI completed before then.  I told her that the best thing for her to do is come in and be seen.  She said she would do so.

## 2014-09-01 ENCOUNTER — Other Ambulatory Visit: Payer: Self-pay

## 2014-09-01 ENCOUNTER — Encounter (INDEPENDENT_AMBULATORY_CARE_PROVIDER_SITE_OTHER): Payer: Self-pay

## 2014-09-01 ENCOUNTER — Ambulatory Visit
Admission: RE | Admit: 2014-09-01 | Discharge: 2014-09-01 | Disposition: A | Discharge status: Home / Self Care | Payer: BC Managed Care – PPO | Source: Ambulatory Visit

## 2014-09-01 DIAGNOSIS — Z1239 Encounter for other screening for malignant neoplasm of breast: Secondary | ICD-10-CM

## 2014-09-01 DIAGNOSIS — Z803 Family history of malignant neoplasm of breast: Secondary | ICD-10-CM

## 2016-02-16 ENCOUNTER — Ambulatory Visit: Payer: Self-pay | Admitting: Family

## 2016-06-02 ENCOUNTER — Other Ambulatory Visit: Payer: Self-pay | Admitting: Family Medicine

## 2016-06-02 DIAGNOSIS — Z1231 Encounter for screening mammogram for malignant neoplasm of breast: Secondary | ICD-10-CM

## 2016-06-09 ENCOUNTER — Ambulatory Visit: Payer: Self-pay

## 2018-01-01 ENCOUNTER — Other Ambulatory Visit: Payer: Self-pay | Admitting: Obstetrics and Gynecology

## 2018-01-01 DIAGNOSIS — Z139 Encounter for screening, unspecified: Secondary | ICD-10-CM

## 2018-01-23 ENCOUNTER — Other Ambulatory Visit: Payer: Self-pay | Admitting: Obstetrics and Gynecology

## 2018-01-23 ENCOUNTER — Ambulatory Visit
Admission: RE | Admit: 2018-01-23 | Discharge: 2018-01-23 | Disposition: A | Discharge status: Home / Self Care | Payer: Managed Care, Other (non HMO) | Source: Ambulatory Visit | Attending: Obstetrics and Gynecology | Admitting: Obstetrics and Gynecology

## 2018-01-23 DIAGNOSIS — R928 Other abnormal and inconclusive findings on diagnostic imaging of breast: Secondary | ICD-10-CM

## 2018-01-23 DIAGNOSIS — Z139 Encounter for screening, unspecified: Secondary | ICD-10-CM

## 2018-01-28 ENCOUNTER — Ambulatory Visit
Admission: RE | Admit: 2018-01-28 | Discharge: 2018-01-28 | Disposition: A | Discharge status: Home / Self Care | Payer: Managed Care, Other (non HMO) | Source: Ambulatory Visit | Attending: Obstetrics and Gynecology | Admitting: Obstetrics and Gynecology

## 2018-01-28 DIAGNOSIS — R928 Other abnormal and inconclusive findings on diagnostic imaging of breast: Secondary | ICD-10-CM

## 2018-01-30 ENCOUNTER — Ambulatory Visit: Payer: Managed Care, Other (non HMO) | Admitting: Physician Assistant

## 2018-01-30 ENCOUNTER — Encounter: Payer: Self-pay | Admitting: Physician Assistant

## 2018-01-30 VITALS — BP 120/76 | HR 80 | Temp 98.2°F | Ht 66.5 in | Wt 153.0 lb

## 2018-01-30 DIAGNOSIS — D229 Melanocytic nevi, unspecified: Secondary | ICD-10-CM | POA: Diagnosis not present

## 2018-01-30 DIAGNOSIS — F988 Other specified behavioral and emotional disorders with onset usually occurring in childhood and adolescence: Secondary | ICD-10-CM | POA: Insufficient documentation

## 2018-01-30 DIAGNOSIS — F419 Anxiety disorder, unspecified: Secondary | ICD-10-CM | POA: Diagnosis not present

## 2018-01-30 DIAGNOSIS — Z720 Tobacco use: Secondary | ICD-10-CM | POA: Diagnosis not present

## 2018-01-30 DIAGNOSIS — Z13220 Encounter for screening for lipoid disorders: Secondary | ICD-10-CM | POA: Diagnosis not present

## 2018-01-30 DIAGNOSIS — Z136 Encounter for screening for cardiovascular disorders: Secondary | ICD-10-CM | POA: Diagnosis not present

## 2018-01-30 DIAGNOSIS — Z0001 Encounter for general adult medical examination with abnormal findings: Secondary | ICD-10-CM

## 2018-01-30 DIAGNOSIS — E559 Vitamin D deficiency, unspecified: Secondary | ICD-10-CM

## 2018-01-30 NOTE — Patient Instructions (Signed)
It was great to meet you!  A few recommendations from today's visit: 1. Wear sunscreen regularly and get your mole checked out from a dermatologist. 2. Work on cutting down the number of cigarettes daily. 3. Continue to walk most days of the week to get your exercise in. 4. Continue to see Dr. Ronita Hipps regularly.  Please make an appointment with the lab on your way out. I would like for you to return for lab work within 1-2 weeks. After midnight on the day of the lab draw, please do not eat anything. You may have water, black coffee, unsweetened tea.  Health Maintenance, Female Adopting a healthy lifestyle and getting preventive care can go a long way to promote health and wellness. Talk with your health care provider about what schedule of regular examinations is right for you. This is a good chance for you to check in with your provider about disease prevention and staying healthy. In between checkups, there are plenty of things you can do on your own. Experts have done a lot of research about which lifestyle changes and preventive measures are most likely to keep you healthy. Ask your health care provider for more information. Weight and diet Eat a healthy diet  Be sure to include plenty of vegetables, fruits, low-fat dairy products, and lean protein.  Do not eat a lot of foods high in solid fats, added sugars, or salt.  Get regular exercise. This is one of the most important things you can do for your health. ? Most adults should exercise for at least 150 minutes each week. The exercise should increase your heart rate and make you sweat (moderate-intensity exercise). ? Most adults should also do strengthening exercises at least twice a week. This is in addition to the moderate-intensity exercise.  Maintain a healthy weight  Body mass index (BMI) is a measurement that can be used to identify possible weight problems. It estimates body fat based on height and weight. Your health care  provider can help determine your BMI and help you achieve or maintain a healthy weight.  For females 51 years of age and older: ? A BMI below 18.5 is considered underweight. ? A BMI of 18.5 to 24.9 is normal. ? A BMI of 25 to 29.9 is considered overweight. ? A BMI of 30 and above is considered obese.  Watch levels of cholesterol and blood lipids  You should start having your blood tested for lipids and cholesterol at 46 years of age, then have this test every 5 years.  You may need to have your cholesterol levels checked more often if: ? Your lipid or cholesterol levels are high. ? You are older than 46 years of age. ? You are at high risk for heart disease.  Cancer screening Lung Cancer  Lung cancer screening is recommended for adults 25-80 years old who are at high risk for lung cancer because of a history of smoking.  A yearly low-dose CT scan of the lungs is recommended for people who: ? Currently smoke. ? Have quit within the past 15 years. ? Have at least a 30-pack-year history of smoking. A pack year is smoking an average of one pack of cigarettes a day for 1 year.  Yearly screening should continue until it has been 15 years since you quit.  Yearly screening should stop if you develop a health problem that would prevent you from having lung cancer treatment.  Breast Cancer  Practice breast self-awareness. This means understanding how your  breasts normally appear and feel.  It also means doing regular breast self-exams. Let your health care provider know about any changes, no matter how small.  If you are in your 20s or 30s, you should have a clinical breast exam (CBE) by a health care provider every 1-3 years as part of a regular health exam.  If you are 31 or older, have a CBE every year. Also consider having a breast X-ray (mammogram) every year.  If you have a family history of breast cancer, talk to your health care provider about genetic screening.  If you are  at high risk for breast cancer, talk to your health care provider about having an MRI and a mammogram every year.  Breast cancer gene (BRCA) assessment is recommended for women who have family members with BRCA-related cancers. BRCA-related cancers include: ? Breast. ? Ovarian. ? Tubal. ? Peritoneal cancers.  Results of the assessment will determine the need for genetic counseling and BRCA1 and BRCA2 testing.  Cervical Cancer Your health care provider may recommend that you be screened regularly for cancer of the pelvic organs (ovaries, uterus, and vagina). This screening involves a pelvic examination, including checking for microscopic changes to the surface of your cervix (Pap test). You may be encouraged to have this screening done every 3 years, beginning at age 45.  For women ages 21-65, health care providers may recommend pelvic exams and Pap testing every 3 years, or they may recommend the Pap and pelvic exam, combined with testing for human papilloma virus (HPV), every 5 years. Some types of HPV increase your risk of cervical cancer. Testing for HPV may also be done on women of any age with unclear Pap test results.  Other health care providers may not recommend any screening for nonpregnant women who are considered low risk for pelvic cancer and who do not have symptoms. Ask your health care provider if a screening pelvic exam is right for you.  If you have had past treatment for cervical cancer or a condition that could lead to cancer, you need Pap tests and screening for cancer for at least 20 years after your treatment. If Pap tests have been discontinued, your risk factors (such as having a new sexual partner) need to be reassessed to determine if screening should resume. Some women have medical problems that increase the chance of getting cervical cancer. In these cases, your health care provider may recommend more frequent screening and Pap tests.  Colorectal Cancer  This type of  cancer can be detected and often prevented.  Routine colorectal cancer screening usually begins at 47 years of age and continues through 46 years of age.  Your health care provider may recommend screening at an earlier age if you have risk factors for colon cancer.  Your health care provider may also recommend using home test kits to check for hidden blood in the stool.  A small camera at the end of a tube can be used to examine your colon directly (sigmoidoscopy or colonoscopy). This is done to check for the earliest forms of colorectal cancer.  Routine screening usually begins at age 71.  Direct examination of the colon should be repeated every 5-10 years through 46 years of age. However, you may need to be screened more often if early forms of precancerous polyps or small growths are found.  Skin Cancer  Check your skin from head to toe regularly.  Tell your health care provider about any new moles or changes in  moles, especially if there is a change in a mole's shape or color.  Also tell your health care provider if you have a mole that is larger than the size of a pencil eraser.  Always use sunscreen. Apply sunscreen liberally and repeatedly throughout the day.  Protect yourself by wearing long sleeves, pants, a wide-brimmed hat, and sunglasses whenever you are outside.  Heart disease, diabetes, and high blood pressure  High blood pressure causes heart disease and increases the risk of stroke. High blood pressure is more likely to develop in: ? People who have blood pressure in the high end of the normal range (130-139/85-89 mm Hg). ? People who are overweight or obese. ? People who are African American.  If you are 35-46 years of age, have your blood pressure checked every 3-5 years. If you are 45 years of age or older, have your blood pressure checked every year. You should have your blood pressure measured twice-once when you are at a hospital or clinic, and once when you are  not at a hospital or clinic. Record the average of the two measurements. To check your blood pressure when you are not at a hospital or clinic, you can use: ? An automated blood pressure machine at a pharmacy. ? A home blood pressure monitor.  If you are between 46 years and 71 years old, ask your health care provider if you should take aspirin to prevent strokes.  Have regular diabetes screenings. This involves taking a blood sample to check your fasting blood sugar level. ? If you are at a normal weight and have a low risk for diabetes, have this test once every three years after 46 years of age. ? If you are overweight and have a high risk for diabetes, consider being tested at a younger age or more often. Preventing infection Hepatitis B  If you have a higher risk for hepatitis B, you should be screened for this virus. You are considered at high risk for hepatitis B if: ? You were born in a country where hepatitis B is common. Ask your health care provider which countries are considered high risk. ? Your parents were born in a high-risk country, and you have not been immunized against hepatitis B (hepatitis B vaccine). ? You have HIV or AIDS. ? You use needles to inject street drugs. ? You live with someone who has hepatitis B. ? You have had sex with someone who has hepatitis B. ? You get hemodialysis treatment. ? You take certain medicines for conditions, including cancer, organ transplantation, and autoimmune conditions.  Hepatitis C  Blood testing is recommended for: ? Everyone born from [birth year] through 1964. ? Anyone with known risk factors for hepatitis C.  Sexually transmitted infections (STIs)  You should be screened for sexually transmitted infections (STIs) including gonorrhea and chlamydia if: ? You are sexually active and are younger than 46 years of age. ? You are older than 46 years of age and your health care provider tells you that you are at risk for this type of  infection. ? Your sexual activity has changed since you were last screened and you are at an increased risk for chlamydia or gonorrhea. Ask your health care provider if you are at risk.  If you do not have HIV, but are at risk, it may be recommended that you take a prescription medicine daily to prevent HIV infection. This is called pre-exposure prophylaxis (PrEP). You are considered at risk if: ? You are  sexually active and do not regularly use condoms or know the HIV status of your partner(s). ? You take drugs by injection. ? You are sexually active with a partner who has HIV.  Talk with your health care provider about whether you are at high risk of being infected with HIV. If you choose to begin PrEP, you should first be tested for HIV. You should then be tested every 3 months for as long as you are taking PrEP. Pregnancy  If you are premenopausal and you may become pregnant, ask your health care provider about preconception counseling.  If you may become pregnant, take 400 to 800 micrograms (mcg) of folic acid every day.  If you want to prevent pregnancy, talk to your health care provider about birth control (contraception). Osteoporosis and menopause  Osteoporosis is a disease in which the bones lose minerals and strength with aging. This can result in serious bone fractures. Your risk for osteoporosis can be identified using a bone density scan.  If you are 35 years of age or older, or if you are at risk for osteoporosis and fractures, ask your health care provider if you should be screened.  Ask your health care provider whether you should take a calcium or vitamin D supplement to lower your risk for osteoporosis.  Menopause may have certain physical symptoms and risks.  Hormone replacement therapy may reduce some of these symptoms and risks. Talk to your health care provider about whether hormone replacement therapy is right for you. Follow these instructions at home:  Schedule  regular health, dental, and eye exams.  Stay current with your immunizations.  Do not use any tobacco products including cigarettes, chewing tobacco, or electronic cigarettes.  If you are pregnant, do not drink alcohol.  If you are breastfeeding, limit how much and how often you drink alcohol.  Limit alcohol intake to no more than 1 drink per day for nonpregnant women. One drink equals 12 ounces of beer, 5 ounces of wine, or 1 ounces of hard liquor.  Do not use street drugs.  Do not share needles.  Ask your health care provider for help if you need support or information about quitting drugs.  Tell your health care provider if you often feel depressed.  Tell your health care provider if you have ever been abused or do not feel safe at home. This information is not intended to replace advice given to you by your health care provider. Make sure you discuss any questions you have with your health care provider. Document Released: 05/08/2011 Document Revised: 03/30/2016 Document Reviewed: 07/27/2015 Elsevier Interactive Patient Education  [employer].

## 2018-01-30 NOTE — Progress Notes (Signed)
Ann Daugherty is a 47 y.o. female here to Establish Care.  I acted as a Education administrator for [employer], PA-C Anselmo Pickler, LPN  History of Present Illness:   Chief Complaint  Patient presents with  . Establish Care    [employer]  . Annual Exam    Acute Concerns: Nevus -- has a freckle in her hairline that she notices has been increasing in size that she would like to get checked out from a dermatologist Anxiety -- reports that overall her anxiety is well controlled but at times uses caffeine and cigarettes to help her symptoms, has seen a therapist in the past. Does not want to take daily medication and also does not think that she needs it right now.  Chronic Issues: Tobacco abuse -- started at age 11 y/o, stopped during pregnancies with breastfeeding, has tried "everything" including patches and medications ADD -- overall well controlled without medication, does feel like caffeine and cigarettes help her Vit D deficiency -- takes daily vitamin D supplement, has not had vit D level checked in quite some time  Health Maintenance: Immunizations -- UTD Colonoscopy -- has not had yet, plan to do at age 34 Mammogram -- done 01/23/2018, U/S done 01/28/2018 -- benign cyst PAP -- scheduled with GYN 02/05/2018 Diet -- eats all food groups, "not the best diet" Caffeine intake -- several (4-5 cups) of coffee daily, no caffeinated sodas Sleep habits -- usually sleeps well Exercise -- no scheduled exercise, has treadmill at home, is active with her job Weight -- Weight: 153 lb (69.4 kg) -- normal for her, 147-155 lb is usual range Body mass index is 24.32 kg/m. Mood -- has done counseling in the past, 2-3 years ago  Depression screen [facility] 2/9 01/30/2018  Decreased Interest 0  Down, Depressed, Hopeless 0  PHQ - 2 Score 0    No flowsheet data found.  Other providers/specialists: Dr. Saintclair Halsted -- neurosurgeon; was told if her back is injured again she will need fusion Dr. Ronita Hipps -- ob-gyn [place]  [facility] -- diagnosed her ADD, doesn't go regularly   Past Medical History:  Diagnosis Date  . ADD (attention deficit disorder) 2015   Does not take any medication     Social History   Socioeconomic History  . Marital status: Married    Spouse name: Not on file  . Number of children: Not on file  . Years of education: Not on file  . Highest education level: Not on file  Occupational History  . Not on file  Social Needs  . Financial resource strain: Not on file  . Food insecurity:    Worry: Not on file    Inability: Not on file  . Transportation needs:    Medical: Not on file    Non-medical: Not on file  Tobacco Use  . Smoking status: Current Every Day Smoker    Packs/day: 0.50    Years: 30.00    Pack years: 15.00    Types: Cigarettes  . Smokeless tobacco: Never Used  Substance and Sexual Activity  . Alcohol use: Yes    Comment: 3-4 drinks a year  . Drug use: No  . Sexual activity: Yes    Birth control/protection: None  Lifestyle  . Physical activity:    Days per week: Not on file    Minutes per session: Not on file  . Stress: Not on file  Relationships  . Social connections:    Talks on phone: Not on file  Gets together: Not on file    Attends religious service: Not on file    Active member of club or organization: Not on file    Attends meetings of clubs or organizations: Not on file    Relationship status: Not on file  . Intimate partner violence:    Fear of current or ex partner: Not on file    Emotionally abused: Not on file    Physically abused: Not on file    Forced sexual activity: Not on file  Other Topics Concern  . 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 BACK SURGERY    . LAMINECTOMY     2007, 2011  . VAGINAL DELIVERY  2004, 2006    Family History  Problem Relation Age of Onset  . Breast cancer Mother 42  .  Colon cancer Mother 64  . Asthma Mother   . Hypertension Mother   . Hearing loss Mother   . Hypertension Father   . Diabetes Father   . Hyperlipidemia Father   . Learning disabilities Son   . ADD / ADHD Son   . Ovarian cancer Maternal Grandmother   . Hypertension Maternal Grandfather   . Hyperlipidemia Maternal Grandfather   . Heart disease Maternal Grandfather   . Stroke Maternal Grandfather   . Mental illness Paternal Grandmother   . Hypertension Paternal Grandfather     No Known Allergies   Current Medications:   Current Outpatient Medications:  .  acetaminophen (TYLENOL) 500 MG tablet, Take 500 mg by mouth every 6 (six) hours as needed for mild pain., Disp: , Rfl:  .  calcium carbonate (TUMS - DOSED IN MG ELEMENTAL CALCIUM) 500 MG chewable tablet, Chew 2 tablets by mouth as needed for indigestion or heartburn., Disp: , Rfl:  .  Cholecalciferol (VITAMIN D3) 5000 units CAPS, , Disp: , Rfl:  .  Ibuprofen 200 MG CAPS, , Disp: , Rfl:  .  Multiple Vitamin (MULTIVITAMIN WITH MINERALS) TABS tablet, Take 2 tablets by mouth daily., Disp: , Rfl:    Review of Systems:   Review of Systems  Constitutional: Negative for chills, fever, malaise/fatigue and weight loss.  HENT: Negative for hearing loss, sinus pain and sore throat.   Respiratory: Negative for cough and hemoptysis.   Cardiovascular: Negative for chest pain, palpitations, leg swelling and PND.  Gastrointestinal: Negative for abdominal pain, constipation, diarrhea, heartburn, nausea and vomiting.  Genitourinary: Negative for dysuria, frequency and urgency.  Musculoskeletal: Negative for back pain, myalgias and neck pain.  Skin: Negative for itching and rash.  Neurological: Negative for dizziness, tingling, seizures and headaches.  Endo/Heme/Allergies: Negative for polydipsia.  Psychiatric/Behavioral: Negative for depression. The patient is not nervous/anxious.     Vitals:   Vitals:   01/30/18 0840  BP: 120/76  Pulse:  80  Temp: 98.2 F (36.8 C)  TempSrc: Oral  SpO2: 96%  Weight: 153 lb (69.4 kg)  Height: 5' 6.5" (1.689 m)     Body mass index is 24.32 kg/m.  Physical Exam:   Physical Exam  Constitutional: She appears well-developed and well-nourished. She is cooperative.  Non-toxic appearance. She does not have a sickly appearance. She does not appear ill. No distress.  HENT:  Head: Normocephalic and atraumatic.  Right Ear: Tympanic membrane, external ear and ear canal normal. Tympanic membrane is not erythematous, not retracted and  not bulging.  Left Ear: Tympanic membrane, external ear and ear canal normal. Tympanic membrane is not erythematous, not retracted and not bulging.  Eyes: Pupils are equal, round, and reactive to light. Conjunctivae, EOM and lids are normal.  Neck: Trachea normal and full passive range of motion without pain.  Cardiovascular: Normal rate, regular rhythm, S1 normal, S2 normal, normal heart sounds and intact distal pulses.  Pulmonary/Chest: Effort normal and breath sounds normal. No tachypnea. No respiratory distress. She has no decreased breath sounds. She has no wheezes. She has no rhonchi. She has no rales.  Abdominal: Soft. Normal appearance and bowel sounds are normal. There is no tenderness.  Musculoskeletal: Normal range of motion.  Lymphadenopathy:    She has no cervical adenopathy.  Neurological: She is alert. She has normal reflexes. No cranial nerve deficit or sensory deficit. GCS eye subscore is 4. GCS verbal subscore is 5. GCS motor subscore is 6.  Skin: Skin is warm, dry and intact.  Dark brown nevus approx 67mm with irregular borders in hair line above R eye  Psychiatric: She has a normal mood and affect. Her speech is normal and behavior is normal.  Nursing note and vitals reviewed.   Assessment and Plan:    Anaisabel was seen today for establish care and annual exam.  Diagnoses and all orders for this visit:  Encounter for general adult medical  examination with abnormal findings Today patient counseled on age appropriate routine health concerns for screening and prevention, each reviewed and up to date or declined. Immunizations reviewed and up to date or declined. Labs ordered and reviewed. Risk factors for depression reviewed and negative. Hearing function and visual acuity are intact. ADLs screened and addressed as needed. Functional ability and level of safety reviewed and appropriate. Education, counseling and referrals performed based on assessed risks today. Patient provided with a copy of personalized plan for preventive services.  Encounter for lipid screening for cardiovascular disease She is not fasting today, will return for fasting labs. -     Lipid panel; Future  Tobacco abuse Encouraged cessation, declined any medications or supportive treatments at this time. -     Lipid panel; Future  Nevus Recommended regular use of sunscreen, provided list of dermatologists for her to review with insurance and establish with.  Anxiety Overall well controlled. Check labs to assess for organic etiology. Consider returning to therapy, declines medication. -     CBC with Differential/Platelet; Future -     Comprehensive metabolic panel; Future -     TSH; Future  Attention deficit disorder, unspecified hyperactivity presence Overall well controlled.  Vitamin D deficiency Will check labs, determine if supplement needs to be adjusted after labs performed. -     VITAMIN D 25 Hydroxy (Vit-D Deficiency, Fractures); Future  . Reviewed expectations re: course of current medical issues. . Discussed self-management of symptoms. . Outlined signs and symptoms indicating need for more acute intervention. . Patient verbalized understanding and all questions were answered. . See orders for this visit as documented in the electronic medical record. . Patient received an After-Visit Summary.   CMA or LPN served as scribe during this visit.  History, Physical, and Plan performed by medical provider. Documentation and orders reviewed and attested to.   Inda Coke, PA-C

## 2018-02-04 ENCOUNTER — Other Ambulatory Visit (INDEPENDENT_AMBULATORY_CARE_PROVIDER_SITE_OTHER): Payer: Managed Care, Other (non HMO)

## 2018-02-04 ENCOUNTER — Other Ambulatory Visit: Payer: Self-pay | Admitting: Physician Assistant

## 2018-02-04 DIAGNOSIS — E559 Vitamin D deficiency, unspecified: Secondary | ICD-10-CM | POA: Diagnosis not present

## 2018-02-04 DIAGNOSIS — Z136 Encounter for screening for cardiovascular disorders: Secondary | ICD-10-CM | POA: Diagnosis not present

## 2018-02-04 DIAGNOSIS — Z13220 Encounter for screening for lipoid disorders: Secondary | ICD-10-CM

## 2018-02-04 DIAGNOSIS — R7309 Other abnormal glucose: Secondary | ICD-10-CM

## 2018-02-04 DIAGNOSIS — Z720 Tobacco use: Secondary | ICD-10-CM | POA: Diagnosis not present

## 2018-02-04 DIAGNOSIS — F419 Anxiety disorder, unspecified: Secondary | ICD-10-CM | POA: Diagnosis not present

## 2018-02-04 LAB — VITAMIN D 25 HYDROXY (VIT D DEFICIENCY, FRACTURES): VITD: 65.36 ng/mL (ref 30.00–100.00)

## 2018-02-04 LAB — CBC WITH DIFFERENTIAL/PLATELET
BASOS ABS: 0 10*3/uL (ref 0.0–0.1)
BASOS PCT: 0.4 % (ref 0.0–3.0)
Eosinophils Absolute: 0.1 10*3/uL (ref 0.0–0.7)
Eosinophils Relative: 2.1 % (ref 0.0–5.0)
HCT: 40 % (ref 36.0–46.0)
HEMOGLOBIN: 13.8 g/dL (ref 12.0–15.0)
LYMPHS PCT: 25.9 % (ref 12.0–46.0)
Lymphs Abs: 1.8 10*3/uL (ref 0.7–4.0)
MCHC: 34.4 g/dL (ref 30.0–36.0)
MCV: 90.4 fl (ref 78.0–100.0)
MONO ABS: 0.4 10*3/uL (ref 0.1–1.0)
Monocytes Relative: 6.4 % (ref 3.0–12.0)
Neutro Abs: 4.5 10*3/uL (ref 1.4–7.7)
Neutrophils Relative %: 65.2 % (ref 43.0–77.0)
PLATELETS: 282 10*3/uL (ref 150.0–400.0)
RBC: 4.43 Mil/uL (ref 3.87–5.11)
RDW: 12.8 % (ref 11.5–15.5)
WBC: 6.9 10*3/uL (ref 4.0–10.5)

## 2018-02-04 LAB — LIPID PANEL
CHOL/HDL RATIO: 3
CHOLESTEROL: 142 mg/dL (ref 0–200)
HDL: 41.7 mg/dL (ref >39.00)
LDL Cholesterol: 82 mg/dL (ref 0–99)
NonHDL: 100.63
TRIGLYCERIDES: 92 mg/dL (ref 0.0–149.0)
VLDL: 18.4 mg/dL (ref 0.0–40.0)

## 2018-02-04 LAB — COMPREHENSIVE METABOLIC PANEL
ALBUMIN: 4 g/dL (ref 3.5–5.2)
ALT: 9 U/L (ref 0–35)
AST: 13 U/L (ref 0–37)
Alkaline Phosphatase: 85 U/L (ref 39–117)
BILIRUBIN TOTAL: 0.2 mg/dL (ref 0.2–1.2)
BUN: 10 mg/dL (ref 6–23)
CALCIUM: 9 mg/dL (ref 8.4–10.5)
CHLORIDE: 104 meq/L (ref 96–112)
CO2: 28 mEq/L (ref 19–32)
CREATININE: 0.72 mg/dL (ref 0.40–1.20)
GFR: 92.63 mL/min (ref >60.00)
Glucose, Bld: 116 mg/dL — ABNORMAL HIGH (ref 70–99)
Potassium: 4.4 mEq/L (ref 3.5–5.1)
SODIUM: 138 meq/L (ref 135–145)
TOTAL PROTEIN: 6.8 g/dL (ref 6.0–8.3)

## 2018-02-04 LAB — HEMOGLOBIN A1C: HEMOGLOBIN A1C: 5.4 % (ref 4.6–6.5)

## 2018-02-04 LAB — TSH: TSH: 0.73 u[IU]/mL (ref 0.35–4.50)

## 2018-08-08 LAB — GLUCOSE, POCT (MANUAL RESULT ENTRY): POC Glucose: 126 mg/dl — AB (ref 70–99)

## 2019-09-09 ENCOUNTER — Ambulatory Visit (INDEPENDENT_AMBULATORY_CARE_PROVIDER_SITE_OTHER): Payer: Managed Care, Other (non HMO) | Admitting: Physician Assistant

## 2019-09-09 ENCOUNTER — Encounter: Payer: Self-pay | Admitting: Physician Assistant

## 2019-09-09 DIAGNOSIS — F419 Anxiety disorder, unspecified: Secondary | ICD-10-CM

## 2019-09-09 DIAGNOSIS — F329 Major depressive disorder, single episode, unspecified: Secondary | ICD-10-CM | POA: Diagnosis not present

## 2019-09-09 MED ORDER — VORTIOXETINE HBR 5 MG PO TABS: 5.0000 mg | ORAL_TABLET | Freq: Every day | ORAL | 0 refills | Status: DC

## 2019-09-09 NOTE — Progress Notes (Signed)
Virtual Visit via Video   I connected with Ann Daugherty on 09/09/19 at 11:40 AM EST by a video enabled telemedicine application and verified that I am speaking with the correct person using two identifiers. Location patient: Home Location provider: [facility], Office Persons participating in the virtual visit: Steely, Mcalpin PA-C,Donna Titus Dubin, LPN   I discussed the limitations of evaluation and management by telemedicine and the availability of in person appointments. The patient expressed understanding and agreed to proceed.  I acted as a Education administrator for [employer], [employer], LPN  Subjective:   HPI:   Anxiety & Depression Pt c/o increase in anxiety & depression for the past 9 months. 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 Pt said she was on vacation in Oct and could not even enjoy it because she was so anxious. Pt has started to see a therapist Jonita Albee seeing her every 2 weeks.  In her 20's she was started on -- Luvox, Wellbutrin, Vyvanse. She could not tolerate these mostly due to worsening anxiety.  Denies SI/HI.  She is currently taking black cohosh and st johns wort.  GAD 7 : Generalized Anxiety Score 09/09/2019  Nervous, Anxious, on Edge 3  Control/stop worrying 3  Worry too much - different things 3  Trouble relaxing 3  Restless 0  Easily annoyed or irritable 3  Afraid - awful might happen 2  Total GAD 7 Score 17  Anxiety Difficulty Very difficult    Depression screen [facility] 2/9 09/09/2019 01/30/2018  Decreased Interest 3 0  Down, Depressed, Hopeless 3 0  PHQ - 2 Score 6 0  Altered sleeping 3 -  Tired, decreased energy 2 -  Change in appetite 3 -  Feeling bad or failure about yourself  1 -  Trouble concentrating 0 -  Moving slowly or fidgety/restless 0 -  Suicidal thoughts 0 -  PHQ-9 Score 15 -  Difficult doing  work/chores Very difficult -    ROS: See pertinent positives and negatives per HPI.  Patient Active Problem List   Diagnosis Date Noted  . Tobacco abuse 01/30/2018  . Anxiety 01/30/2018  . Attention deficit disorder 01/30/2018  . Vitamin D deficiency 01/30/2018    Social History   Tobacco Use  . Smoking status: Current Every Day Smoker    Packs/day: 0.50    Years: 30.00    Pack years: 15.00    Types: Cigarettes  . Smokeless tobacco: Never Used  Substance Use Topics  . Alcohol use: Yes    Comment: 3-4 drinks a year    Current Outpatient Medications:  .  acetaminophen (TYLENOL) 500 MG tablet, Take 500 mg by mouth every 6 (six) hours as needed for mild pain., Disp: , Rfl:  .  Black Cohosh 40 MG CAPS, Take 1 capsule by mouth daily., Disp: , Rfl:  .  calcium carbonate (TUMS - DOSED IN MG ELEMENTAL CALCIUM) 500 MG chewable tablet, Chew 2 tablets by mouth as needed for indigestion or heartburn., Disp: , Rfl:  .  Cholecalciferol (VITAMIN D3) 5000 units CAPS, Take 2 capsules by mouth daily. , Disp: , Rfl:  .  Ibuprofen 200 MG CAPS, , Disp: , Rfl:  .  Multiple Vitamin (MULTIVITAMIN WITH MINERALS) TABS tablet, Take 2 tablets by mouth daily., Disp: , Rfl:  .  St Johns Wort 150 MG CAPS, Take 1 capsule  by mouth daily., Disp: , Rfl:  .  vortioxetine HBr (TRINTELLIX) 5 MG TABS tablet, Take 1 tablet (5 mg total) by mouth daily., Disp: 14 tablet, Rfl: 0  No Known Allergies  Objective:   VITALS: Per patient if applicable, see vitals. GENERAL: Alert, appears well and in no acute distress HEENT: Atraumatic, conjunctiva clear, no obvious abnormalities on inspection of external nose and ears. NECK: Normal movements of the head and neck. CARDIOPULMONARY: No increased WOB. Speaking in clear sentences. I:E ratio WNL.  MS: Moves all visible extremities without noticeable abnormality. PSYCH: Pleasant and cooperative, well-groomed. Speech normal rate and rhythm. Affect is appropriate. Insight and  judgement are appropriate. Attention is focused, linear, and appropriate.  NEURO: CN grossly intact. Oriented as arrived to appointment on time with no prompting. Moves both UE equally.  SKIN: No obvious lesions, wounds, erythema, or cyanosis noted on face or hands.  Assessment and Plan:   [name] was seen today for anxiety and depression.  Diagnoses and all orders for this visit:  Anxiety and depression Uncontrolled. Will trial Trintellix 5 mg daily. I did offer referral to psych but she declined at this time. I recommended that she send me a MyChart message the day after starting medication to see how she is tolerating this (reportedly had "instant" anxiety and dilated pupils after taking anti-depressants in the past.) Discussed that she tell her husband that she is starting this medication and if she has any unpleasant/dangerous side effects to immediately stop the medication.  Other orders -     vortioxetine HBr (TRINTELLIX) 5 MG TABS tablet; Take 1 tablet (5 mg total) by mouth daily.  . Reviewed expectations re: course of current medical issues. . Discussed self-management of symptoms. . Outlined signs and symptoms indicating need for more acute intervention. . Patient verbalized understanding and all questions were answered. Marland Kitchen Health Maintenance issues including appropriate healthy diet, exercise, and smoking avoidance were discussed with patient. . See orders for this visit as documented in the electronic medical record.  I discussed the assessment and treatment plan with the patient. The patient was provided an opportunity to ask questions and all were answered. The patient agreed with the plan and demonstrated an understanding of the instructions.   The patient was advised to call back or seek an in-person evaluation if the symptoms worsen or if the condition fails to improve as anticipated.   CMA or LPN served as scribe during this visit. History, Physical, and Plan performed  by medical provider. The above documentation has been reviewed and is accurate and complete.   [place], [place] 09/09/2019

## 2019-09-11 ENCOUNTER — Encounter: Payer: Self-pay | Admitting: Physician Assistant

## 2019-09-11 DIAGNOSIS — F419 Anxiety disorder, unspecified: Secondary | ICD-10-CM

## 2019-09-19 ENCOUNTER — Ambulatory Visit (INDEPENDENT_AMBULATORY_CARE_PROVIDER_SITE_OTHER): Payer: Managed Care, Other (non HMO) | Admitting: Physician Assistant

## 2019-09-19 ENCOUNTER — Encounter: Payer: Self-pay | Admitting: Physician Assistant

## 2019-09-19 VITALS — Ht 66.5 in | Wt 160.0 lb

## 2019-09-19 DIAGNOSIS — F419 Anxiety disorder, unspecified: Secondary | ICD-10-CM

## 2019-09-19 DIAGNOSIS — F329 Major depressive disorder, single episode, unspecified: Secondary | ICD-10-CM

## 2019-09-19 MED ORDER — HYDROXYZINE HCL 25 MG PO TABS: 25.0000 mg | ORAL_TABLET | Freq: Three times a day (TID) | ORAL | 0 refills | Status: DC | PRN

## 2019-09-19 MED ORDER — VORTIOXETINE HBR 5 MG PO TABS: 5.0000 mg | ORAL_TABLET | Freq: Every day | ORAL | 1 refills | Status: DC

## 2019-09-19 NOTE — Progress Notes (Signed)
Virtual Visit via Video   I connected with Ann Daugherty on 09/19/19 at  9:20 AM EST by a video enabled telemedicine application and verified that I am speaking with the correct person using two identifiers. Location patient: Home Location provider: [facility], Office Persons participating in the virtual visit: Winnie, Cisney PA-C, Anselmo Pickler, LPN   I discussed the limitations of evaluation and management by telemedicine and the availability of in person appointments. The patient expressed understanding and agreed to proceed.  I acted as a Education administrator for [employer], [employer], LPN  Subjective:   HPI:   Anxiety & Depression Pt following up today, last visit was started on Trintellix 5 mg. Pt said she is feeling better and medication is helping. Crying spells have stopped, irritability less. Still having a lot of anxiety.  She states that yesterday she did a GAD-7 and was 14 and her PHQ 9 was 11.  She is seeing her therapist next Tuesday.  She has taken off a week from work and is doing better with this.  She denies any suicidal or homicidal thoughts.  She would like to continue Trintellix.  Depression screen [facility] 2/9 09/09/2019 01/30/2018  Decreased Interest 3 0  Down, Depressed, Hopeless 3 0  PHQ - 2 Score 6 0  Altered sleeping 3 -  Tired, decreased energy 2 -  Change in appetite 3 -  Feeling bad or failure about yourself  1 -  Trouble concentrating 0 -  Moving slowly or fidgety/restless 0 -  Suicidal thoughts 0 -  PHQ-9 Score 15 -  Difficult doing work/chores Very difficult -   GAD 7 : Generalized Anxiety Score 09/09/2019  Nervous, Anxious, on Edge 3  Control/stop worrying 3  Worry too much - different things 3  Trouble relaxing 3  Restless 0  Easily annoyed or irritable 3  Afraid - awful might happen 2  Total GAD 7 Score 17  Anxiety Difficulty Very difficult      ROS: See pertinent positives and negatives per HPI.  Patient  Active Problem List   Diagnosis Date Noted  . Tobacco abuse 01/30/2018  . Anxiety 01/30/2018  . Attention deficit disorder 01/30/2018  . Vitamin D deficiency 01/30/2018    Social History   Tobacco Use  . Smoking status: Current Every Day Smoker    Packs/day: 0.50    Years: 30.00    Pack years: 15.00    Types: Cigarettes  . Smokeless tobacco: Never Used  Substance Use Topics  . Alcohol use: Yes    Comment: 3-4 drinks a year    Current Outpatient Medications:  .  acetaminophen (TYLENOL) 500 MG tablet, Take 500 mg by mouth every 6 (six) hours as needed for mild pain., Disp: , Rfl:  .  calcium carbonate (TUMS - DOSED IN MG ELEMENTAL CALCIUM) 500 MG chewable tablet, Chew 2 tablets by mouth as needed for indigestion or heartburn., Disp: , Rfl:  .  Cholecalciferol (VITAMIN D3) 5000 units CAPS, Take 2 capsules by mouth daily. , Disp: , Rfl:  .  Ibuprofen 200 MG CAPS, , Disp: , Rfl:  .  Multiple Vitamin (MULTIVITAMIN WITH MINERALS) TABS tablet, Take 2 tablets by mouth daily., Disp: , Rfl:  .  hydrOXYzine (ATARAX/VISTARIL) 25 MG tablet, Take 1 tablet (25 mg total) by mouth every 8 (eight) hours as needed for anxiety (insomnia). Take one 25 mg tablet 30-60 minutes prior to bedtime for insomnia, anxiety. May increase to two tablets.,  Disp: 60 tablet, Rfl: 0 .  vortioxetine HBr (TRINTELLIX) 5 MG TABS tablet, Take 1 tablet (5 mg total) by mouth daily., Disp: 30 tablet, Rfl: 1  No Known Allergies  Objective:   VITALS: Per patient if applicable, see vitals. GENERAL: Alert, appears well and in no acute distress. HEENT: Atraumatic, conjunctiva clear, no obvious abnormalities on inspection of external nose and ears. NECK: Normal movements of the head and neck. CARDIOPULMONARY: No increased WOB. Speaking in clear sentences. I:E ratio WNL.  MS: Moves all visible extremities without noticeable abnormality. PSYCH: Pleasant and cooperative, well-groomed. Speech normal rate and rhythm. Affect is  appropriate. Insight and judgement are appropriate. Attention is focused, linear, and appropriate.  NEURO: CN grossly intact. Oriented as arrived to appointment on time with no prompting. Moves both UE equally.  SKIN: No obvious lesions, wounds, erythema, or cyanosis noted on face or hands.  Assessment and Plan:   Kilyn was seen today for anxiety and depression.  Diagnoses and all orders for this visit:  Anxiety and depression No red flags on discussion with patient.  She has been on medication for only 10 days and is doing better.  I would like to continue current regimen.  She would like something for breakthrough anxiety, so I will prescribe hydroxyzine as needed.  Follow-up with me in 1 month, sooner if concerns.  Other orders -     vortioxetine HBr (TRINTELLIX) 5 MG TABS tablet; Take 1 tablet (5 mg total) by mouth daily. -     hydrOXYzine (ATARAX/VISTARIL) 25 MG tablet; Take 1 tablet (25 mg total) by mouth every 8 (eight) hours as needed for anxiety (insomnia). Take one 25 mg tablet 30-60 minutes prior to bedtime for insomnia, anxiety. May increase to two tablets.  . Reviewed expectations re: course of current medical issues. . Discussed self-management of symptoms. . Outlined signs and symptoms indicating need for more acute intervention. . Patient verbalized understanding and all questions were answered. Marland Kitchen Health Maintenance issues including appropriate healthy diet, exercise, and smoking avoidance were discussed with patient. . See orders for this visit as documented in the electronic medical record.  I discussed the assessment and treatment plan with the patient. The patient was provided an opportunity to ask questions and all were answered. The patient agreed with the plan and demonstrated an understanding of the instructions.   The patient was advised to call back or seek an in-person evaluation if the symptoms worsen or if the condition fails to improve as anticipated.    CMA or LPN served as scribe during this visit. History, Physical, and Plan performed by medical provider. The above documentation has been reviewed and is accurate and complete.  [place], [place] 09/19/2019

## 2019-09-23 ENCOUNTER — Telehealth: Payer: Self-pay | Admitting: Physician Assistant

## 2019-09-23 NOTE — Telephone Encounter (Signed)
Spoke to pt told her I started PA on medication yesterday but have not heard anything back yet. Pt verbalized understanding and said the Rx has to be 90 days also she was told by insurance or they will not pay. Told her okay, mean while I do have samples of Trintellix 10 mg tablets that you can cut in half. I will put them at the front desk for you to pick up today. Pt verbalized understanding.

## 2019-09-23 NOTE — Telephone Encounter (Signed)
See note

## 2019-09-23 NOTE — Telephone Encounter (Signed)
Pt states that she is completely out and have not taken any today and would like to know if she can come get a sample.

## 2019-09-23 NOTE — Telephone Encounter (Signed)
Pt called and stated that insurance will not cover a 30 day supply of vortioxetine HBr (TRINTELLIX) 5 MG TABS tablet [id number]. It would cost $458 for a 30 day supply. Insurance company will cover a 90 day supply. Please advise

## 2019-09-24 MED ORDER — VORTIOXETINE HBR 5 MG PO TABS: 5.0000 mg | ORAL_TABLET | Freq: Every day | ORAL | 0 refills | Status: DC

## 2019-09-24 NOTE — Telephone Encounter (Signed)
Spoke to pt told her received info from [employer] and PA was denied for Trintellix. Told pt I resent the Rx for 90 day supply to see if that changes since you told me yesterday that it had to be 90 day supply. Pt verbalized understanding. Told pt to check with pharmacy later today to see what they say and let me know. Pt verbalized understanding.

## 2019-09-24 NOTE — Addendum Note (Signed)
Addended by: Marian Sorrow on: 09/24/2019 07:47 AM   Modules accepted: Orders

## 2019-09-24 NOTE — Telephone Encounter (Signed)
Denied on November 17 PA Case: [id number], Status: Denied. Notification: Completed. Drug Trintellix (formerly Brintellix) 5MG  tablets Form Librarian, academic PA Form  Aldona Bar notified will try to send New Rx with 90 day supply to see if that works. New Rx sent.

## 2019-09-25 NOTE — Telephone Encounter (Signed)
Tried to contact pt unable to leave message voicemail box full.

## 2019-09-26 NOTE — Telephone Encounter (Signed)
Tried to contact pt unable to leave message voicemail box full.

## 2019-09-30 ENCOUNTER — Encounter: Payer: Self-pay | Admitting: *Deleted

## 2019-09-30 NOTE — Telephone Encounter (Signed)
Sent pt My Chart message regarding medication.

## 2019-10-10 ENCOUNTER — Ambulatory Visit (INDEPENDENT_AMBULATORY_CARE_PROVIDER_SITE_OTHER): Payer: Managed Care, Other (non HMO) | Admitting: Physician Assistant

## 2019-10-10 ENCOUNTER — Encounter: Payer: Self-pay | Admitting: Physician Assistant

## 2019-10-10 VITALS — Wt 165.0 lb

## 2019-10-10 DIAGNOSIS — F329 Major depressive disorder, single episode, unspecified: Secondary | ICD-10-CM | POA: Diagnosis not present

## 2019-10-10 DIAGNOSIS — F32A Depression, unspecified: Secondary | ICD-10-CM

## 2019-10-10 DIAGNOSIS — F419 Anxiety disorder, unspecified: Secondary | ICD-10-CM

## 2019-10-10 MED ORDER — CITALOPRAM HYDROBROMIDE 10 MG PO TABS: 10.0000 mg | ORAL_TABLET | Freq: Every day | ORAL | 0 refills | Status: DC

## 2019-10-10 NOTE — Progress Notes (Signed)
Virtual Visit via Video   I connected with Ann Daugherty on 10/10/19 at  4:00 PM EST by a video enabled telemedicine application and verified that I am speaking with the correct person using two identifiers. Location patient: Home Location provider: [facility], Office Persons participating in the virtual visit: Maayan, [place] PA-C  I discussed the limitations of evaluation and management by telemedicine and the availability of in person appointments. The patient expressed understanding and agreed to proceed.  Subjective:   HPI:   Anxiety and Depression Doing well with Trintellix 5 mg. Feels better than she has in month. Very few crying spells. Taking hydroxyzine prn and has helped her sleep, has taken a few times. Unfortunately her insurance will not pay for her Trintellix, so we will have to trial another medication first to see if it can be approved.  Depression screen [facility] 2/9 10/10/2019 09/09/2019 01/30/2018  Decreased Interest 1 3 0  Down, Depressed, Hopeless 1 3 0  PHQ - 2 Score 2 6 0  Altered sleeping 0 3 -  Tired, decreased energy 1 2 -  Change in appetite 0 3 -  Feeling bad or failure about yourself  0 1 -  Trouble concentrating 1 0 -  Moving slowly or fidgety/restless 0 0 -  Suicidal thoughts 0 0 -  PHQ-9 Score 4 15 -  Difficult doing work/chores Not difficult at all Very difficult -      ROS: See pertinent positives and negatives per HPI.  Patient Active Problem List   Diagnosis Date Noted  . Tobacco abuse 01/30/2018  . Anxiety 01/30/2018  . Attention deficit disorder 01/30/2018  . Vitamin D deficiency 01/30/2018    Social History   Tobacco Use  . Smoking status: Current Every Day Smoker    Packs/day: 0.50    Years: 30.00    Pack years: 15.00    Types: Cigarettes  . Smokeless tobacco: Never Used  Substance Use Topics  . Alcohol use: Yes    Comment: 3-4 drinks a year    Current Outpatient Medications:  .  acetaminophen  (TYLENOL) 500 MG tablet, Take 500 mg by mouth every 6 (six) hours as needed for mild pain., Disp: , Rfl:  .  calcium carbonate (TUMS - DOSED IN MG ELEMENTAL CALCIUM) 500 MG chewable tablet, Chew 2 tablets by mouth as needed for indigestion or heartburn., Disp: , Rfl:  .  Cholecalciferol (VITAMIN D3) 5000 units CAPS, Take 2 capsules by mouth daily. , Disp: , Rfl:  .  hydrOXYzine (ATARAX/VISTARIL) 25 MG tablet, Take 1 tablet (25 mg total) by mouth every 8 (eight) hours as needed for anxiety (insomnia). Take one 25 mg tablet 30-60 minutes prior to bedtime for insomnia, anxiety. May increase to two tablets., Disp: 60 tablet, Rfl: 0 .  Ibuprofen 200 MG CAPS, , Disp: , Rfl:  .  Multiple Vitamin (MULTIVITAMIN WITH MINERALS) TABS tablet, Take 2 tablets by mouth daily., Disp: , Rfl:  .  vortioxetine HBr (TRINTELLIX) 5 MG TABS tablet, Take 1 tablet (5 mg total) by mouth daily., Disp: 90 tablet, Rfl: 0 .  citalopram (CELEXA) 10 MG tablet, Take 1 tablet (10 mg total) by mouth daily., Disp: 30 tablet, Rfl: 0  No Known Allergies  Objective:   VITALS: Per patient if applicable, see vitals. GENERAL: Alert, appears well and in no acute distress. HEENT: Atraumatic, conjunctiva clear, no obvious abnormalities on inspection of external nose and ears. NECK: Normal movements of the head and  neck. CARDIOPULMONARY: No increased WOB. Speaking in clear sentences. I:E ratio WNL.  MS: Moves all visible extremities without noticeable abnormality. PSYCH: Pleasant and cooperative, well-groomed. Speech normal rate and rhythm. Affect is appropriate. Insight and judgement are appropriate. Attention is focused, linear, and appropriate.  NEURO: CN grossly intact. Oriented as arrived to appointment on time with no prompting. Moves both UE equally.  SKIN: No obvious lesions, wounds, erythema, or cyanosis noted on face or hands.  Assessment and Plan:   Ann Daugherty was seen today for follow-up.  Diagnoses and all orders for this  visit:  Anxiety and depression  Other orders -     citalopram (CELEXA) 10 MG tablet; Take 1 tablet (10 mg total) by mouth daily.   Well controlled, however unfortunately due to her insurance we will have to trial another medication prior to continuing this medication. Will stop Trintellix 5mg  daily and start Celexa 10 mg. Follow-up in 1 month, sooner if concerns. If Celexa works well for patient, we will continue. If Celexa does not work well for patient, we will try to resubmit Prior Authorization for Trintellix. I discussed with patient that if they develop any SI, to tell someone immediately and seek medical attention.   . Reviewed expectations re: course of current medical issues. . Discussed self-management of symptoms. . Outlined signs and symptoms indicating need for more acute intervention. . Patient verbalized understanding and all questions were answered. Marland Kitchen Health Maintenance issues including appropriate healthy diet, exercise, and smoking avoidance were discussed with patient. . See orders for this visit as documented in the electronic medical record.  I discussed the assessment and treatment plan with the patient. The patient was provided an opportunity to ask questions and all were answered. The patient agreed with the plan and demonstrated an understanding of the instructions.   The patient was advised to call back or seek an in-person evaluation if the symptoms worsen or if the condition fails to improve as anticipated.   [place], [place] 10/10/2019

## 2019-11-06 ENCOUNTER — Ambulatory Visit (INDEPENDENT_AMBULATORY_CARE_PROVIDER_SITE_OTHER): Payer: Managed Care, Other (non HMO) | Admitting: Physician Assistant

## 2019-11-06 ENCOUNTER — Encounter: Payer: Self-pay | Admitting: Physician Assistant

## 2019-11-06 VITALS — Ht 66.5 in | Wt 165.0 lb

## 2019-11-06 DIAGNOSIS — F419 Anxiety disorder, unspecified: Secondary | ICD-10-CM | POA: Diagnosis not present

## 2019-11-06 MED ORDER — VORTIOXETINE HBR 5 MG PO TABS: 5.0000 mg | ORAL_TABLET | Freq: Every day | ORAL | 0 refills | Status: DC

## 2019-11-06 NOTE — Progress Notes (Signed)
Virtual Visit via Video   I connected with Ann Daugherty on 11/06/19 at  7:40 AM EST by a video enabled telemedicine application and verified that I am speaking with the correct person using two identifiers. Location patient: Home Location provider: [facility], Office Persons participating in the virtual visit: Ann Daugherty, [place] PA-C, Ann Pickler, LPN   I discussed the limitations of evaluation and management by telemedicine and the availability of in person appointments. The patient expressed understanding and agreed to proceed.  I acted as a Education administrator for [employer], [employer], LPN  Subjective:   HPI:   Anxiety& Depression Pt following up today. She trialed Celexa 10 mg but she found this to cause severe mood changes after 2-3 weeks. She discontinued her Celexa because of this and resumed her sample of Trintellix 5 mg daily. Pt is feeling so much better. No crying spells, and states "I feel like myself again." Denies SI/HI.  Past failed treatments: Luvox, Wellbutrin, Vyvanse, Celexa   Depression screen [facility] 2/9 11/06/2019 10/10/2019 09/09/2019 01/30/2018  Decreased Interest 1 1 3  0  Down, Depressed, Hopeless 1 1 3  0  PHQ - 2 Score 2 2 6  0  Altered sleeping 0 0 3 -  Tired, decreased energy 0 1 2 -  Change in appetite 0 0 3 -  Feeling bad or failure about yourself  0 0 1 -  Trouble concentrating 0 1 0 -  Moving slowly or fidgety/restless 0 0 0 -  Suicidal thoughts 0 0 0 -  PHQ-9 Score 2 4 15  -  Difficult doing work/chores Not difficult at all Not difficult at all Very difficult -   GAD 7 : Generalized Anxiety Score 11/06/2019 09/09/2019  Nervous, Anxious, on Edge 1 3  Control/stop worrying 0 3  Worry too much - different things 1 3  Trouble relaxing 0 3  Restless 0 0  Easily annoyed or irritable 1 3  Afraid - awful might happen 0 2  Total GAD 7 Score 3 17  Anxiety Difficulty Somewhat difficult Very difficult    ROS: See pertinent  positives and negatives per HPI.  Patient Active Problem List   Diagnosis Date Noted  . Tobacco abuse 01/30/2018  . Anxiety 01/30/2018  . Attention deficit disorder 01/30/2018  . Vitamin D deficiency 01/30/2018    Social History   Tobacco Use  . Smoking status: Current Every Day Smoker    Packs/day: 0.50    Years: 30.00    Pack years: 15.00    Types: Cigarettes  . Smokeless tobacco: Never Used  Substance Use Topics  . Alcohol use: Yes    Comment: 3-4 drinks a year    Current Outpatient Medications:  .  acetaminophen (TYLENOL) 500 MG tablet, Take 500 mg by mouth every 6 (six) hours as needed for mild pain., Disp: , Rfl:  .  calcium carbonate (TUMS - DOSED IN MG ELEMENTAL CALCIUM) 500 MG chewable tablet, Chew 2 tablets by mouth as needed for indigestion or heartburn., Disp: , Rfl:  .  Cholecalciferol (VITAMIN D3) 5000 units CAPS, Take 1 capsule by mouth daily. , Disp: , Rfl:  .  hydrOXYzine (ATARAX/VISTARIL) 25 MG tablet, Take 1 tablet (25 mg total) by mouth every 8 (eight) hours as needed for anxiety (insomnia). Take one 25 mg tablet 30-60 minutes prior to bedtime for insomnia, anxiety. May increase to two tablets., Disp: 60 tablet, Rfl: 0 .  Ibuprofen 200 MG CAPS, Take 400 mg by mouth  daily as needed. , Disp: , Rfl:  .  Multiple Vitamin (MULTIVITAMIN WITH MINERALS) TABS tablet, Take 2 tablets by mouth daily., Disp: , Rfl:  .  vortioxetine HBr (TRINTELLIX) 5 MG TABS tablet, Take 1 tablet (5 mg total) by mouth daily., Disp: 90 tablet, Rfl: 0  No Known Allergies  Objective:   VITALS: Per patient if applicable, see vitals. GENERAL: Alert, appears well and in no acute distress. HEENT: Atraumatic, conjunctiva clear, no obvious abnormalities on inspection of external nose and ears. NECK: Normal movements of the head and neck. CARDIOPULMONARY: No increased WOB. Speaking in clear sentences. I:E ratio WNL.  MS: Moves all visible extremities without noticeable abnormality. PSYCH:  Pleasant and cooperative, well-groomed. Speech normal rate and rhythm. Affect is appropriate. Insight and judgement are appropriate. Attention is focused, linear, and appropriate.  NEURO: CN grossly intact. Oriented as arrived to appointment on time with no prompting. Moves both UE equally.  SKIN: No obvious lesions, wounds, erythema, or cyanosis noted on face or hands.  Assessment and Plan:   Ann Daugherty was seen today for anxiety and depression.  Diagnoses and all orders for this visit:  Anxiety  Other orders -     vortioxetine HBr (TRINTELLIX) 5 MG TABS tablet; Take 1 tablet (5 mg total) by mouth daily.   Significantly improved. Continue Trintellix 5 mg daily x 3 months, follow-up in 3 months, sooner if concerns.  . Reviewed expectations re: course of current medical issues. . Discussed self-management of symptoms. . Outlined signs and symptoms indicating need for more acute intervention. . Patient verbalized understanding and all questions were answered. Marland Kitchen Health Maintenance issues including appropriate healthy diet, exercise, and smoking avoidance were discussed with patient. . See orders for this visit as documented in the electronic medical record.  I discussed the assessment and treatment plan with the patient. The patient was provided an opportunity to ask questions and all were answered. The patient agreed with the plan and demonstrated an understanding of the instructions.   The patient was advised to call back or seek an in-person evaluation if the symptoms worsen or if the condition fails to improve as anticipated.   CMA or LPN served as scribe during this visit. History, Physical, and Plan performed by medical provider. The above documentation has been reviewed and is accurate and complete.  [place], [place] 11/06/2019

## 2019-11-12 ENCOUNTER — Other Ambulatory Visit: Payer: Self-pay | Admitting: Physician Assistant

## 2020-01-10 ENCOUNTER — Ambulatory Visit: Payer: BC Managed Care – PPO | Attending: Internal Medicine

## 2020-01-10 DIAGNOSIS — Z23 Encounter for immunization: Secondary | ICD-10-CM

## 2020-01-10 NOTE — Progress Notes (Signed)
   Covid-19 Vaccination Clinic  Name:  Ann Daugherty    MRN: [id number] DOB: 06/20/1971  01/10/2020  [name] was observed post Covid-19 immunization for 15 minutes without incident. She was provided with Vaccine Information Sheet and instruction to access the V-Safe system.   [name] was instructed to call 911 with any severe reactions post vaccine: Marland Kitchen Difficulty breathing  . Swelling of face and throat  . A fast heartbeat  . A bad rash all over body  . Dizziness and weakness   Immunizations Administered    Name Date Dose VIS Date Route   Pfizer COVID-19 Vaccine 01/10/2020 10:15 AM 0.3 mL 10/17/2019 Intramuscular   Manufacturer: [place]   Lot: [id number]   [place]: [id number]

## 2020-01-31 ENCOUNTER — Ambulatory Visit: Payer: BC Managed Care – PPO | Attending: Internal Medicine

## 2020-01-31 DIAGNOSIS — Z23 Encounter for immunization: Secondary | ICD-10-CM

## 2020-01-31 NOTE — Progress Notes (Signed)
   Covid-19 Vaccination Clinic  Name:  Ann Daugherty    MRN: [id number] DOB: 12-07-1970  01/31/2020  Ann Daugherty was observed post Covid-19 immunization for 15 minutes without incident. She was provided with Vaccine Information Sheet and instruction to access the V-Safe system.   Ann Daugherty was instructed to call 911 with any severe reactions post vaccine: Marland Kitchen Difficulty breathing  . Swelling of face and throat  . A fast heartbeat  . A bad rash all over body  . Dizziness and weakness   Immunizations Administered    Name Date Dose VIS Date Route   Pfizer COVID-19 Vaccine 01/31/2020 11:22 AM 0.3 mL 10/17/2019 Intramuscular   Manufacturer: [place]   Lot: [id number]   [place]: [id number]

## 2020-07-21 ENCOUNTER — Other Ambulatory Visit: Payer: Self-pay | Admitting: Physician Assistant

## 2020-07-22 MED ORDER — HYDROXYZINE HCL 25 MG PO TABS: 25.0000 mg | ORAL_TABLET | Freq: Three times a day (TID) | ORAL | 0 refills | Status: DC | PRN

## 2020-07-28 ENCOUNTER — Encounter: Payer: Self-pay | Admitting: Physician Assistant

## 2020-07-28 ENCOUNTER — Telehealth (INDEPENDENT_AMBULATORY_CARE_PROVIDER_SITE_OTHER): Payer: BC Managed Care – PPO | Admitting: Physician Assistant

## 2020-07-28 DIAGNOSIS — F419 Anxiety disorder, unspecified: Secondary | ICD-10-CM | POA: Diagnosis not present

## 2020-07-28 DIAGNOSIS — F329 Major depressive disorder, single episode, unspecified: Secondary | ICD-10-CM | POA: Diagnosis not present

## 2020-07-28 MED ORDER — LORAZEPAM 0.5 MG PO TABS: 0.5000 mg | ORAL_TABLET | Freq: Two times a day (BID) | ORAL | 1 refills | Status: DC | PRN

## 2020-07-28 NOTE — Progress Notes (Signed)
Virtual Visit via Video   I connected with Raylene Everts on 07/28/20 at 12:00 PM EDT by a video enabled telemedicine application and verified that I am speaking with the correct person using two identifiers. Location patient: Home Location provider: [facility], Office Persons participating in the virtual visit: [place], [place] PA-C  I discussed the limitations of evaluation and management by telemedicine and the availability of in person appointments. The patient expressed understanding and agreed to proceed.  Subjective:   HPI:   Anxiety and Depression Going through difficult separation and home repairs right now. Has little support system, other than her mother. Denies SI/HI. Restarted Celexa 10 mg last Thursday.  She is tolerating this well but hasn't noticed significant improvement in her symptoms. Having lots of crying spells and panic attacks. Hydroxyzine sometimes works but causes sedation.   ROS: See pertinent positives and negatives per HPI.  Patient Active Problem List   Diagnosis Date Noted   Tobacco abuse 01/30/2018   Anxiety 01/30/2018   Attention deficit disorder 01/30/2018   Vitamin D deficiency 01/30/2018    Social History   Tobacco Use   Smoking status: Current Every Day Smoker    Packs/day: 0.50    Years: 30.00    Pack years: 15.00    Types: Cigarettes   Smokeless tobacco: Never Used  Substance Use Topics   Alcohol use: Yes    Comment: 3-4 drinks a year    Current Outpatient Medications:    acetaminophen (TYLENOL) 500 MG tablet, Take 500 mg by mouth every 6 (six) hours as needed for mild pain., Disp: , Rfl:    calcium carbonate (TUMS - DOSED IN MG ELEMENTAL CALCIUM) 500 MG chewable tablet, Chew 2 tablets by mouth as needed for indigestion or heartburn., Disp: , Rfl:    Cholecalciferol (VITAMIN D3) 5000 units CAPS, Take 1 capsule by mouth daily. , Disp: , Rfl:    hydrOXYzine (ATARAX/VISTARIL) 25 MG tablet, Take  1 tablet (25 mg total) by mouth every 8 (eight) hours as needed for anxiety (insomnia). Take one 25 mg tablet 30-60 minutes prior to bedtime for insomnia, anxiety. May increase to two tablets., Disp: 60 tablet, Rfl: 0   Ibuprofen 200 MG CAPS, Take 400 mg by mouth daily as needed. , Disp: , Rfl:    LORazepam (ATIVAN) 0.5 MG tablet, Take 1 tablet (0.5 mg total) by mouth 2 (two) times daily as needed for anxiety., Disp: 30 tablet, Rfl: 1   Multiple Vitamin (MULTIVITAMIN WITH MINERALS) TABS tablet, Take 2 tablets by mouth daily., Disp: , Rfl:    vortioxetine HBr (TRINTELLIX) 5 MG TABS tablet, Take 1 tablet (5 mg total) by mouth daily., Disp: 90 tablet, Rfl: 0  No Known Allergies  Objective:   VITALS: Per patient if applicable, see vitals. GENERAL: Alert, appears well and in no acute distress. Tearful at times HEENT: Atraumatic, conjunctiva clear, no obvious abnormalities on inspection of external nose and ears. NECK: Normal movements of the head and neck. CARDIOPULMONARY: No increased WOB. Speaking in clear sentences. I:E ratio WNL.  MS: Moves all visible extremities without noticeable abnormality. PSYCH: Pleasant and cooperative, well-groomed. Speech normal rate and rhythm. Affect is appropriate. Insight and judgement are appropriate. Attention is focused, linear, and appropriate.  NEURO: CN grossly intact. Oriented as arrived to appointment on time with no prompting. Moves both UE equally.  SKIN: No obvious lesions, wounds, erythema, or cyanosis noted on face or hands.  Assessment and Plan:   Diagnoses  and all orders for this visit:  Anxiety and depression  Other orders -     LORazepam (ATIVAN) 0.5 MG tablet; Take 1 tablet (0.5 mg total) by mouth 2 (two) times daily as needed for anxiety.   Uncontrolled. Continue with Celexa 10 mg restart. Start ativan prn. I discussed with patient that if they develop any SI, to tell someone immediately and seek medical attention. Follow-up with  me next week, sooner if concerns.  I discussed the assessment and treatment plan with the patient. The patient was provided an opportunity to ask questions and all were answered. The patient agreed with the plan and demonstrated an understanding of the instructions.   The patient was advised to call back or seek an in-person evaluation if the symptoms worsen or if the condition fails to improve as anticipated.   [place], [place] 07/28/2020

## 2020-08-04 ENCOUNTER — Telehealth (INDEPENDENT_AMBULATORY_CARE_PROVIDER_SITE_OTHER): Payer: BC Managed Care – PPO | Admitting: Physician Assistant

## 2020-08-04 ENCOUNTER — Encounter: Payer: Self-pay | Admitting: Physician Assistant

## 2020-08-04 VITALS — Wt 141.5 lb

## 2020-08-04 DIAGNOSIS — F419 Anxiety disorder, unspecified: Secondary | ICD-10-CM

## 2020-08-04 DIAGNOSIS — R5383 Other fatigue: Secondary | ICD-10-CM

## 2020-08-04 DIAGNOSIS — F329 Major depressive disorder, single episode, unspecified: Secondary | ICD-10-CM | POA: Diagnosis not present

## 2020-08-04 NOTE — Progress Notes (Signed)
Virtual Visit via Video   I connected with Ann Daugherty on 08/04/20 at 12:00 PM EDT by a video enabled telemedicine application and verified that I am speaking with the correct person using two identifiers. Location patient: Home Location provider:  [facility], [facility] Persons participating in the virtual visit: [place], [place] PA-C  I discussed the limitations of evaluation and management by telemedicine and the availability of in person appointments. The patient expressed understanding and agreed to proceed.  Subjective:   HPI:   Anxiety and Depression; Fatigue  She is getting good sleep with the Ativan. Taking Celexa 10 mg daily for about 2 weeks now. She has been having issues with fatigue. Feels like its a combination of medication, depression, decreased appetite. When she gets stressed out, she doesn't eat often -- she is down at least 20 lb this year, unintentionally. Does have heavy periods at times.  Denies: rectal bleeding, chest pain, syncope or pre-syncope, n/v/d/c, SI/HI   Wt Readings from Last 4 Encounters:  08/04/20 141 lb 8.6 oz (64.2 kg)  11/06/19 165 lb (74.8 kg)  10/10/19 165 lb (74.8 kg)  09/19/19 160 lb (72.6 kg)     ROS: See pertinent positives and negatives per HPI.  Patient Active Problem List   Diagnosis Date Noted  . Tobacco abuse 01/30/2018  . Anxiety 01/30/2018  . Attention deficit disorder 01/30/2018  . Vitamin D deficiency 01/30/2018    Social History   Tobacco Use  . Smoking status: Current Every Day Smoker    Packs/day: 0.50    Years: 30.00    Pack years: 15.00    Types: Cigarettes  . Smokeless tobacco: Never Used  Substance Use Topics  . Alcohol use: Yes    Comment: 3-4 drinks a year    Current Outpatient Medications:  .  citalopram (CELEXA) 10 MG tablet, Take 10 mg by mouth daily., Disp: , Rfl:  .  acetaminophen (TYLENOL) 500 MG tablet, Take 500 mg by mouth every 6 (six) hours as needed for mild pain.,  Disp: , Rfl:  .  calcium carbonate (TUMS - DOSED IN MG ELEMENTAL CALCIUM) 500 MG chewable tablet, Chew 2 tablets by mouth as needed for indigestion or heartburn., Disp: , Rfl:  .  Cholecalciferol (VITAMIN D3) 5000 units CAPS, Take 1 capsule by mouth daily. , Disp: , Rfl:  .  Ibuprofen 200 MG CAPS, Take 400 mg by mouth daily as needed. , Disp: , Rfl:  .  LORazepam (ATIVAN) 0.5 MG tablet, Take 1 tablet (0.5 mg total) by mouth 2 (two) times daily as needed for anxiety., Disp: 30 tablet, Rfl: 1 .  Multiple Vitamin (MULTIVITAMIN WITH MINERALS) TABS tablet, Take 2 tablets by mouth daily., Disp: , Rfl:   No Known Allergies  Objective:   VITALS: Per patient if applicable, see vitals. GENERAL: Alert, appears well and in no acute distress. HEENT: Atraumatic, conjunctiva clear, no obvious abnormalities on inspection of external nose and ears. NECK: Normal movements of the head and neck. CARDIOPULMONARY: No increased WOB. Speaking in clear sentences. I:E ratio WNL.  MS: Moves all visible extremities without noticeable abnormality. PSYCH: Pleasant and cooperative, well-groomed. Speech normal rate and rhythm. Affect is appropriate. Insight and judgement are appropriate. Attention is focused, linear, and appropriate.  NEURO: CN grossly intact. Oriented as arrived to appointment on time with no prompting. Moves both UE equally.  SKIN: No obvious lesions, wounds, erythema, or cyanosis noted on face or hands.  Assessment and Plan:  Diagnoses and all orders for this visit:  Fatigue, unspecified type Multifactorial, suspect related to mood, medication, lack of adequate nutrition. No acute red flags but did recommend that she come to the office Monday 10/4 for CPE. Update labs.  Anxiety and depression Improving but still uncontrolled. Continue Celexa 10 mg daily, start taking at night to see if this helps with daytime fatigue. Appt in office scheduled on 10/4 for CPE and fatigue work-up.  I discussed  the assessment and treatment plan with the patient. The patient was provided an opportunity to ask questions and all were answered. The patient agreed with the plan and demonstrated an understanding of the instructions.   The patient was advised to call back or seek an in-person evaluation if the symptoms worsen or if the condition fails to improve as anticipated.   CMA or LPN served as scribe during this visit. History, Physical, and Plan performed by medical provider. The above documentation has been reviewed and is accurate and complete.  [place], [place] 08/04/2020

## 2020-08-09 ENCOUNTER — Other Ambulatory Visit: Payer: Self-pay

## 2020-08-09 ENCOUNTER — Ambulatory Visit (INDEPENDENT_AMBULATORY_CARE_PROVIDER_SITE_OTHER): Payer: BC Managed Care – PPO | Admitting: Physician Assistant

## 2020-08-09 ENCOUNTER — Encounter: Payer: Self-pay | Admitting: Physician Assistant

## 2020-08-09 VITALS — BP 104/70 | HR 90 | Temp 98.3°F | Ht 66.0 in | Wt 143.0 lb

## 2020-08-09 DIAGNOSIS — Z0000 Encounter for general adult medical examination without abnormal findings: Secondary | ICD-10-CM | POA: Diagnosis not present

## 2020-08-09 DIAGNOSIS — Z23 Encounter for immunization: Secondary | ICD-10-CM | POA: Diagnosis not present

## 2020-08-09 DIAGNOSIS — Z114 Encounter for screening for human immunodeficiency virus [HIV]: Secondary | ICD-10-CM

## 2020-08-09 DIAGNOSIS — R5383 Other fatigue: Secondary | ICD-10-CM | POA: Diagnosis not present

## 2020-08-09 DIAGNOSIS — F419 Anxiety disorder, unspecified: Secondary | ICD-10-CM | POA: Diagnosis not present

## 2020-08-09 DIAGNOSIS — F32A Depression, unspecified: Secondary | ICD-10-CM

## 2020-08-09 DIAGNOSIS — Z720 Tobacco use: Secondary | ICD-10-CM | POA: Diagnosis not present

## 2020-08-09 DIAGNOSIS — E559 Vitamin D deficiency, unspecified: Secondary | ICD-10-CM

## 2020-08-09 DIAGNOSIS — Z1159 Encounter for screening for other viral diseases: Secondary | ICD-10-CM

## 2020-08-09 MED ORDER — CITALOPRAM HYDROBROMIDE 10 MG PO TABS: 10.0000 mg | ORAL_TABLET | Freq: Every day | ORAL | 1 refills | Status: DC

## 2020-08-09 NOTE — Patient Instructions (Addendum)
It was great to see you!  I have refilled Celexa 10 mg daily. Follow-up in 1 month regarding this. If you feel like you need a dose increase between now and then, I think it would be reasonable to increase in two weeks -- send me a MyChart message.  Please call Dr. Kennith Maes office to get you scheduled.  Please be thinking about how much time you would like for [place] to complete your FMLA paperwork for.  Please go to the lab for blood work.   Our office will call you with your results unless you have chosen to receive results via MyChart.  If your blood work is normal we will follow-up each year for physicals and as scheduled for chronic medical problems.  If anything is abnormal we will treat accordingly and get you in for a follow-up.  Take care,  [facility] Maintenance, Female Adopting a healthy lifestyle and getting preventive care are important in promoting health and wellness. Ask your health care provider about:  The right schedule for you to have regular tests and exams.  Things you can do on your own to prevent diseases and keep yourself healthy. What should I know about diet, weight, and exercise? Eat a healthy diet   Eat a diet that includes plenty of vegetables, fruits, low-fat dairy products, and lean protein.  Do not eat a lot of foods that are high in solid fats, added sugars, or sodium. Maintain a healthy weight Body mass index (BMI) is used to identify weight problems. It estimates body fat based on height and weight. Your health care provider can help determine your BMI and help you achieve or maintain a healthy weight. Get regular exercise Get regular exercise. This is one of the most important things you can do for your health. Most adults should:  Exercise for at least 150 minutes each week. The exercise should increase your heart rate and make you sweat (moderate-intensity exercise).  Do strengthening exercises at least twice a week. This is in  addition to the moderate-intensity exercise.  Spend less time sitting. Even light physical activity can be beneficial. Watch cholesterol and blood lipids Have your blood tested for lipids and cholesterol at 48 years of age, then have this test every 5 years. Have your cholesterol levels checked more often if:  Your lipid or cholesterol levels are high.  You are older than 48 years of age.  You are at high risk for heart disease. What should I know about cancer screening? Depending on your health history and family history, you may need to have cancer screening at various ages. This may include screening for:  Breast cancer.  Cervical cancer.  Colorectal cancer.  Skin cancer.  Lung cancer. What should I know about heart disease, diabetes, and high blood pressure? Blood pressure and heart disease  High blood pressure causes heart disease and increases the risk of stroke. This is more likely to develop in people who have high blood pressure readings, are of African descent, or are overweight.  Have your blood pressure checked: ? Every 3-5 years if you are 55-64 years of age. ? Every year if you are 40 years old or older. Diabetes Have regular diabetes screenings. This checks your fasting blood sugar level. Have the screening done:  Once every three years after age 79 if you are at a normal weight and have a low risk for diabetes.  More often and at a younger age if you are overweight  or have a high risk for diabetes. What should I know about preventing infection? Hepatitis B If you have a higher risk for hepatitis B, you should be screened for this virus. Talk with your health care provider to find out if you are at risk for hepatitis B infection. Hepatitis C Testing is recommended for:  Everyone born from [birth year] through 1964.  Anyone with known risk factors for hepatitis C. Sexually transmitted infections (STIs)  Get screened for STIs, including gonorrhea and chlamydia,  if: ? You are sexually active and are younger than 48 years of age. ? You are older than 48 years of age and your health care provider tells you that you are at risk for this type of infection. ? Your sexual activity has changed since you were last screened, and you are at increased risk for chlamydia or gonorrhea. Ask your health care provider if you are at risk.  Ask your health care provider about whether you are at high risk for HIV. Your health care provider may recommend a prescription medicine to help prevent HIV infection. If you choose to take medicine to prevent HIV, you should first get tested for HIV. You should then be tested every 3 months for as long as you are taking the medicine. Pregnancy  If you are about to stop having your period (premenopausal) and you may become pregnant, seek counseling before you get pregnant.  Take 400 to 800 micrograms (mcg) of folic acid every day if you become pregnant.  Ask for birth control (contraception) if you want to prevent pregnancy. Osteoporosis and menopause Osteoporosis is a disease in which the bones lose minerals and strength with aging. This can result in bone fractures. If you are 58 years old or older, or if you are at risk for osteoporosis and fractures, ask your health care provider if you should:  Be screened for bone loss.  Take a calcium or vitamin D supplement to lower your risk of fractures.  Be given hormone replacement therapy (HRT) to treat symptoms of menopause. Follow these instructions at home: Lifestyle  Do not use any products that contain nicotine or tobacco, such as cigarettes, e-cigarettes, and chewing tobacco. If you need help quitting, ask your health care provider.  Do not use street drugs.  Do not share needles.  Ask your health care provider for help if you need support or information about quitting drugs. Alcohol use  Do not drink alcohol if: ? Your health care provider tells you not to  drink. ? You are pregnant, may be pregnant, or are planning to become pregnant.  If you drink alcohol: ? Limit how much you use to 0-1 drink a day. ? Limit intake if you are breastfeeding.  Be aware of how much alcohol is in your drink. In the [place] one drink equals one 12 oz bottle of beer (355 mL), one 5 oz glass of wine (148 mL), or one 1 oz glass of hard liquor (44 mL). General instructions  Schedule regular health, dental, and eye exams.  Stay current with your vaccines.  Tell your health care provider if: ? You often feel depressed. ? You have ever been abused or do not feel safe at home. Summary  Adopting a healthy lifestyle and getting preventive care are important in promoting health and wellness.  Follow your health care provider's instructions about healthy diet, exercising, and getting tested or screened for diseases.  Follow your health care provider's instructions on monitoring your cholesterol and blood  pressure. This information is not intended to replace advice given to you by your health care provider. Make sure you discuss any questions you have with your health care provider. Document Revised: 10/16/2018 Document Reviewed: 10/16/2018 Elsevier Patient Education  2020 [employer].

## 2020-08-09 NOTE — Progress Notes (Signed)
I acted as a Education administrator for [employer], PA-C Anselmo Pickler, LPN    Subjective:    Ann Daugherty is a 49 y.o. female and is here for a comprehensive physical exam.   HPI  Health Maintenance Due  Topic Date Due  . Hepatitis C Screening  Never done  . PAP SMEAR-Modifier  Never done  . MAMMOGRAM  01/24/2019  . INFLUENZA VACCINE  06/06/2020    Acute Concerns: Fatigue -- fully discussed on 9/29 during visit -- see note for more information. Symptoms slightly improved, but ongoing.  Chronic Issues: GAD/Depression --  celexa 10 mg daily. During out last visit she was instructed to start taking Celexa at night to help with daytime fatigue. She states that she has had relief of symptoms with this. She is hoping to complete FMLA paperwork soon to help her with this. Denies SI/HI. Tobacco abuse -- stopped in February 2020 -- November 2020. Used the patch in the past with some relief. Vit D deficiency -- will update Vit D level today.  Health Maintenance: Immunizations -- UTD, will give Flu today, Pt will check records for Tetanus. Colonoscopy -- due Mammogram -- overdue PAP -- overdue, pt will schedule appt with GYN Bone Density -- N/A Diet -- has been eating one meal a day Caffeine intake -- drinks coffee throughout the day Sleep habits -- ok with ativan at the moment Exercise -- walking; none significant Weight -- Weight: 143 lb (64.9 kg)  Mood -- see above Weight history: Wt Readings from Last 10 Encounters:  08/09/20 143 lb (64.9 kg)  08/04/20 141 lb 8.6 oz (64.2 kg)  11/06/19 165 lb (74.8 kg)  10/10/19 165 lb (74.8 kg)  09/19/19 160 lb (72.6 kg)  01/30/18 153 lb (69.4 kg)   Body mass index is 23.08 kg/m. Patient's last menstrual period was 07/18/2020. Alcohol use: rare Tobacco use: 0.5 PPD  Depression screen PHQ 2/9 08/09/2020  Decreased Interest 3  Down, Depressed, Hopeless 3  PHQ - 2 Score 6  Altered sleeping 2  Tired, decreased energy 2  Change in  appetite 3  Feeling bad or failure about yourself  3  Trouble concentrating 3  Moving slowly or fidgety/restless 1  Suicidal thoughts 1  PHQ-9 Score 21  Difficult doing work/chores Very difficult     Other providers/specialists: Patient Care Team: Inda Coke, [place] as PCP - General (Physician Assistant) Obgyn, Erling Conte as Consulting Physician (Obstetrics and Gynecology) Kary Kos, MD as Consulting Physician (Neurosurgery)    PMHx, SurgHx, SocialHx, Medications, and Allergies were reviewed in the Visit Navigator and updated as appropriate.   Past Medical History:  Diagnosis Date  . ADD (attention deficit disorder) 2015   Does not take any medication  . GERD (gastroesophageal reflux disease)   . History of chicken pox      Past Surgical History:  Procedure Laterality Date  . BACK SURGERY    . LAMINECTOMY     2007, 2011  . VAGINAL DELIVERY  2004, 2006     Family History  Problem Relation Age of Onset  . Breast cancer Mother 44  . Colon cancer Mother 23  . Asthma Mother   . Hypertension Mother   . Hearing loss Mother   . Hypertension Father   . Diabetes Father   . Hyperlipidemia Father   . Learning disabilities Son   . ADD / ADHD Son   . Ovarian cancer Maternal Grandmother   . Hypertension Maternal Grandfather   . Hyperlipidemia Maternal  Grandfather   . Heart disease Maternal Grandfather   . Stroke Maternal Grandfather   . Mental illness Paternal Grandmother   . Hypertension Paternal Grandfather     Social History   Tobacco Use  . Smoking status: Current Every Day Smoker    Packs/day: 0.50    Years: 30.00    Pack years: 15.00    Types: Cigarettes  . Smokeless tobacco: Never Used  Vaping Use  . Vaping Use: Former  Substance Use Topics  . Alcohol use: Yes    Comment: 3-4 drinks a year  . Drug use: No    Review of Systems:   Review of Systems  Constitutional: Positive for malaise/fatigue. Negative for chills, fever and weight loss.  HENT:  Negative for hearing loss, sinus pain and sore throat.   Respiratory: Negative for cough and hemoptysis.   Cardiovascular: Negative for chest pain, palpitations, leg swelling and PND.  Gastrointestinal: Negative for abdominal pain, constipation, diarrhea, heartburn, nausea and vomiting.  Genitourinary: Negative for dysuria, frequency and urgency.  Musculoskeletal: Negative for back pain, myalgias and neck pain.  Skin: Negative for itching and rash.  Neurological: Negative for dizziness, tingling, seizures and headaches.  Endo/Heme/Allergies: Negative for polydipsia.  Psychiatric/Behavioral: Positive for depression. The patient is nervous/anxious.     Objective:   BP 104/70 (BP Location: Left Arm, Patient Position: Sitting, Cuff Size: Normal)   Pulse 90   Temp 98.3 F (36.8 C) (Temporal)   Ht 5\' 6"  (1.676 m)   Wt 143 lb (64.9 kg)   LMP 07/18/2020   SpO2 96%   BMI 23.08 kg/m  Body mass index is 23.08 kg/m.   General Appearance:    Alert, cooperative, no distress, appears stated age  Head:    Normocephalic, without obvious abnormality, atraumatic  Eyes:    PERRL, conjunctiva/corneas clear, EOM's intact, fundi    benign, both eyes  Ears:    Normal TM's and external ear canals, both ears  Nose:   Nares normal, septum midline, mucosa normal, no drainage    or sinus tenderness  Throat:   Lips, mucosa, and tongue normal; teeth and gums normal  Neck:   Supple, symmetrical, trachea midline, no adenopathy;    thyroid:  no enlargement/tenderness/nodules; no carotid   bruit or JVD  Back:     Symmetric, no curvature, ROM normal, no CVA tenderness  Lungs:     Clear to auscultation bilaterally, respirations unlabored  Chest Wall:    No tenderness or deformity   Heart:    Regular rate and rhythm, S1 and S2 normal, no murmur, rub or gallop  Breast Exam:    Deferred  Abdomen:     Soft, non-tender, bowel sounds active all four quadrants,    no masses, no organomegaly  Genitalia:    Deferred    Extremities:   Extremities normal, atraumatic, no cyanosis or edema  Pulses:   2+ and symmetric all extremities  Skin:   Skin color, texture, turgor normal, no rashes or lesions  Lymph nodes:   Cervical, supraclavicular, and axillary nodes normal  Neurologic:   CNII-XII intact, normal strength, sensation and reflexes    throughout     Assessment/Plan:   Salvatrice was seen today for annual exam.  Diagnoses and all orders for this visit:  Routine physical examination Today patient counseled on age appropriate routine health concerns for screening and prevention, each reviewed and up to date or declined. Immunizations reviewed and up to date or declined. Labs ordered and reviewed.  Risk factors for depression reviewed and negative. Hearing function and visual acuity are intact. ADLs screened and addressed as needed. Functional ability and level of safety reviewed and appropriate. Education, counseling and referrals performed based on assessed risks today. Patient provided with a copy of personalized plan for preventive services.  Fatigue, unspecified type Suspect multifactorial. Will add on TSH and B12 to further evaluation. Encouraged regular nutrition and hydration throughout the day -- this is currently limited due to her mood. Continue to monitor/work-up based on clinical response. -     Vitamin B12; Future -     TSH; Future -     Vitamin B12 -     TSH  Anxiety and depression Improved. Will consider increase of Celexa 10 mg after about two more weeks. I have asked her to send me a mychart regarding this. I discussed with patient that if they develop any SI, to tell someone immediately and seek medical attention. Follow-up in 1 month to review.  Tobacco abuse Counseled. -     CBC with Differential/Platelet; Future -     Comprehensive metabolic panel; Future -     Lipid panel; Future -     CBC with Differential/Platelet -     Comprehensive metabolic panel -     Lipid  panel  Vitamin D deficiency -     VITAMIN D 25 Hydroxy (Vit-D Deficiency, Fractures); Future -     VITAMIN D 25 Hydroxy (Vit-D Deficiency, Fractures)  Encounter for screening for other viral diseases -     Hepatitis C antibody; Future -     Hepatitis C antibody  Screening for HIV (human immunodeficiency virus) -     HIV Antibody (routine testing w rflx); Future -     HIV Antibody (routine testing w rflx)  Other orders -     citalopram (CELEXA) 10 MG tablet; Take 1 tablet (10 mg total) by mouth daily.    Well Adult Exam: Labs ordered: Yes. Patient counseling was done. See below for items discussed. Discussed the patient's BMI.  The BMI is in the acceptable range Follow up in 1 month. Breast cancer screening: counseled. Cervical cancer screening: counseled. Colon cancer screening: counseled.  Patient Counseling: [x]    Nutrition: Stressed importance of moderation in sodium/caffeine intake, saturated fat and cholesterol, caloric balance, sufficient intake of fresh fruits, vegetables, fiber, calcium, iron, and 1 mg of folate supplement per day (for females capable of pregnancy).  [x]    Stressed the importance of regular exercise.   [x]    Substance Abuse: Discussed cessation/primary prevention of tobacco, alcohol, or other drug use; driving or other dangerous activities under the influence; availability of treatment for abuse.   [x]    Injury prevention: Discussed safety belts, safety helmets, smoke detector, smoking near bedding or upholstery.   [x]    Sexuality: Discussed sexually transmitted diseases, partner selection, use of condoms, avoidance of unintended pregnancy  and contraceptive alternatives.  [x]    Dental health: Discussed importance of regular tooth brushing, flossing, and dental visits.  [x]    Health maintenance and immunizations reviewed. Please refer to Health maintenance section.   CMA or LPN served as scribe during this visit. History, Physical, and Plan performed by medical  provider. The above documentation has been reviewed and is accurate and complete.  Inda Coke, PA-C [place]

## 2020-08-11 LAB — TSH: TSH: 0.86 mIU/L

## 2020-08-11 LAB — COMPREHENSIVE METABOLIC PANEL
AG Ratio: 1.8 (calc) (ref 1.0–2.5)
ALT: 11 U/L (ref 6–29)
AST: 18 U/L (ref 10–35)
Albumin: 4.2 g/dL (ref 3.6–5.1)
Alkaline phosphatase (APISO): 82 U/L (ref 31–125)
BUN: 8 mg/dL (ref 7–25)
CO2: 21 mmol/L (ref 20–32)
Calcium: 9 mg/dL (ref 8.6–10.2)
Chloride: 107 mmol/L (ref 98–110)
Creat: 0.63 mg/dL (ref 0.50–1.10)
Globulin: 2.4 g/dL (calc) (ref 1.9–3.7)
Glucose, Bld: 95 mg/dL (ref 65–99)
Potassium: 4.1 mmol/L (ref 3.5–5.3)
Sodium: 137 mmol/L (ref 135–146)
Total Bilirubin: 0.3 mg/dL (ref 0.2–1.2)
Total Protein: 6.6 g/dL (ref 6.1–8.1)

## 2020-08-11 LAB — HIV ANTIBODY (ROUTINE TESTING W REFLEX): HIV 1&2 Ab, 4th Generation: NONREACTIVE

## 2020-08-11 LAB — CBC WITH DIFFERENTIAL/PLATELET
Absolute Monocytes: 465 cells/uL (ref 200–950)
Basophils Absolute: 31 cells/uL (ref 0–200)
Basophils Relative: 0.5 %
Eosinophils Absolute: 161 cells/uL (ref 15–500)
Eosinophils Relative: 2.6 %
HCT: 38.1 % (ref 35.0–45.0)
Hemoglobin: 12.7 g/dL (ref 11.7–15.5)
Lymphs Abs: 1748 cells/uL (ref 850–3900)
MCH: 29.3 pg (ref 27.0–33.0)
MCHC: 33.3 g/dL (ref 32.0–36.0)
MCV: 88 fL (ref 80.0–100.0)
MPV: 11 fL (ref 7.5–12.5)
Monocytes Relative: 7.5 %
Neutro Abs: 3794 cells/uL (ref 1500–7800)
Neutrophils Relative %: 61.2 %
Platelets: 284 10*3/uL (ref 140–400)
RBC: 4.33 10*6/uL (ref 3.80–5.10)
RDW: 13.3 % (ref 11.0–15.0)
Total Lymphocyte: 28.2 %
WBC: 6.2 10*3/uL (ref 3.8–10.8)

## 2020-08-11 LAB — LIPID PANEL
Cholesterol: 144 mg/dL (ref <200)
HDL: 47 mg/dL — ABNORMAL LOW (ref >=50)
LDL Cholesterol (Calc): 82 mg/dL (calc)
Non-HDL Cholesterol (Calc): 97 mg/dL (calc) (ref <130)
Total CHOL/HDL Ratio: 3.1 (calc) (ref <5.0)
Triglycerides: 74 mg/dL (ref <150)

## 2020-08-11 LAB — HEPATITIS C ANTIBODY
Hepatitis C Ab: NONREACTIVE
SIGNAL TO CUT-OFF: 0.01 (ref <1.00)

## 2020-08-11 LAB — VITAMIN D 25 HYDROXY (VIT D DEFICIENCY, FRACTURES): Vit D, 25-Hydroxy: 66 ng/mL (ref 30–100)

## 2020-08-11 LAB — VITAMIN B12: Vitamin B-12: 313 pg/mL (ref 200–1100)

## 2020-08-21 ENCOUNTER — Other Ambulatory Visit: Payer: Self-pay | Admitting: Physician Assistant

## 2020-09-06 ENCOUNTER — Encounter: Payer: Self-pay | Admitting: Physician Assistant

## 2020-09-06 ENCOUNTER — Other Ambulatory Visit: Payer: Self-pay | Admitting: Physician Assistant

## 2020-09-06 MED ORDER — CITALOPRAM HYDROBROMIDE 20 MG PO TABS: 20.0000 mg | ORAL_TABLET | Freq: Every day | ORAL | 1 refills | Status: DC

## 2020-09-17 ENCOUNTER — Other Ambulatory Visit: Payer: Self-pay

## 2020-09-17 ENCOUNTER — Encounter: Payer: Self-pay | Admitting: Physician Assistant

## 2020-09-17 ENCOUNTER — Telehealth (INDEPENDENT_AMBULATORY_CARE_PROVIDER_SITE_OTHER): Payer: BC Managed Care – PPO | Admitting: Physician Assistant

## 2020-09-17 VITALS — Ht 66.0 in | Wt 140.0 lb

## 2020-09-17 DIAGNOSIS — M21371 Foot drop, right foot: Secondary | ICD-10-CM

## 2020-09-17 MED ORDER — METHYLPREDNISOLONE 4 MG PO TBPK: ORAL_TABLET | ORAL | 0 refills | Status: DC

## 2020-09-17 NOTE — Progress Notes (Signed)
Virtual Visit via Video   I connected with Ann Daugherty on 09/17/20 at 12:30 PM EST by a video enabled telemedicine application and verified that I am speaking with the correct person using two identifiers. Location patient: Home Location provider: [facility], Office Persons participating in the virtual visit: [place], [place] PA-C, Anselmo Pickler, LPN   I discussed the limitations of evaluation and management by telemedicine and the availability of in person appointments. The patient expressed understanding and agreed to proceed.  I acted as a Education administrator for [employer], [employer], LPN   Subjective:   HPI:   Right foot drop Pt c/o right foot drop that started yesterday. She is having a flare with back spasms and right leg numbness and weakness. She is taking Advil, using TENS unit and ice, and wearing a brace. Hx lumbar surgeries and has had this in the past. It typically resolves with medrol dose pack. Is able to bear weight and walk, but is having weakness with dorsiflexion.  ROS: See pertinent positives and negatives per HPI.  Patient Active Problem List   Diagnosis Date Noted  . Tobacco abuse 01/30/2018  . Anxiety 01/30/2018  . Attention deficit disorder 01/30/2018  . Vitamin D deficiency 01/30/2018    Social History   Tobacco Use  . Smoking status: Current Every Day Smoker    Packs/day: 0.50    Years: 30.00    Pack years: 15.00    Types: Cigarettes  . Smokeless tobacco: Never Used  Substance Use Topics  . Alcohol use: Yes    Comment: 3-4 drinks a year    Current Outpatient Medications:  .  acetaminophen (TYLENOL) 500 MG tablet, Take 500 mg by mouth every 6 (six) hours as needed for mild pain., Disp: , Rfl:  .  calcium carbonate (TUMS - DOSED IN MG ELEMENTAL CALCIUM) 500 MG chewable tablet, Chew 2 tablets by mouth as needed for indigestion or heartburn., Disp: , Rfl:  .  Cholecalciferol (VITAMIN D3) 5000 units CAPS, Take 1  capsule by mouth daily. , Disp: , Rfl:  .  citalopram (CELEXA) 20 MG tablet, Take 1 tablet (20 mg total) by mouth daily., Disp: 90 tablet, Rfl: 1 .  Cyanocobalamin (B-12) 2500 MCG SUBL, Place 1 tablet under the tongue daily., Disp: , Rfl:  .  Ibuprofen 200 MG CAPS, Take 400 mg by mouth daily as needed. , Disp: , Rfl:  .  LORazepam (ATIVAN) 0.5 MG tablet, Take 1 tablet (0.5 mg total) by mouth 2 (two) times daily as needed for anxiety., Disp: 30 tablet, Rfl: 1 .  Multiple Vitamin (MULTIVITAMIN WITH MINERALS) TABS tablet, Take 2 tablets by mouth daily., Disp: , Rfl:  .  methylPREDNISolone (MEDROL DOSEPAK) 4 MG TBPK tablet, 6-5-4-3-2-1-off, Disp: 21 tablet, Rfl: 0  No Known Allergies  Objective:   VITALS: Per patient if applicable, see vitals. GENERAL: Alert, appears well and in no acute distress. HEENT: Atraumatic, conjunctiva clear, no obvious abnormalities on inspection of external nose and ears. NECK: Normal movements of the head and neck. CARDIOPULMONARY: No increased WOB. Speaking in clear sentences. I:E ratio WNL.  MS: Moves all visible extremities without noticeable abnormality except for RLE has limited dorsiflexion PSYCH: Pleasant and cooperative, well-groomed. Speech normal rate and rhythm. Affect is appropriate. Insight and judgement are appropriate. Attention is focused, linear, and appropriate.  NEURO: CN grossly intact. Oriented as arrived to appointment on time with no prompting. Moves both UE equally.  SKIN: No obvious lesions,  wounds, erythema, or cyanosis noted on face or hands.  Assessment and Plan:   Ann Daugherty was seen today for right foot drop.  Diagnoses and all orders for this visit:  Right foot drop  Other orders -     methylPREDNISolone (MEDROL DOSEPAK) 4 MG TBPK tablet; 6-5-4-3-2-1-off   Recurrent issue for patient due to hx of documented lumbar issues. Will trial oral medrol dosepack. If no improvement, we will proceed with PT and MRI. Patient verbalized  understanding and is in agreement to plan.  I discussed the assessment and treatment plan with the patient. The patient was provided an opportunity to ask questions and all were answered. The patient agreed with the plan and demonstrated an understanding of the instructions.   The patient was advised to call back or seek an in-person evaluation if the symptoms worsen or if the condition fails to improve as anticipated.   CMA or LPN served as scribe during this visit. History, Physical, and Plan performed by medical provider. The above documentation has been reviewed and is accurate and complete.  [place], [place] 09/17/2020

## 2021-03-05 ENCOUNTER — Other Ambulatory Visit: Payer: Self-pay | Admitting: Physician Assistant

## 2021-04-01 ENCOUNTER — Telehealth: Payer: Self-pay

## 2021-04-01 NOTE — Telephone Encounter (Signed)
Patient called in stating she is covid positive and wanted to know what are good over the counter medications to take.

## 2021-04-01 NOTE — Telephone Encounter (Signed)
Spoke to pt told her I just sent her a My Chart message with information about COVID what signs to look for and what you can take. If any other questions please call back. Hope you feel better soon. Pt verbalized understanding.

## 2021-04-05 ENCOUNTER — Telehealth (INDEPENDENT_AMBULATORY_CARE_PROVIDER_SITE_OTHER): Payer: BC Managed Care – PPO | Admitting: Family Medicine

## 2021-04-05 ENCOUNTER — Encounter: Payer: Self-pay | Admitting: Family Medicine

## 2021-04-05 ENCOUNTER — Other Ambulatory Visit: Payer: Self-pay | Admitting: Physician Assistant

## 2021-04-05 VITALS — Ht 66.0 in | Wt 145.0 lb

## 2021-04-05 DIAGNOSIS — U071 COVID-19: Secondary | ICD-10-CM

## 2021-04-05 DIAGNOSIS — F419 Anxiety disorder, unspecified: Secondary | ICD-10-CM

## 2021-04-05 DIAGNOSIS — F320 Major depressive disorder, single episode, mild: Secondary | ICD-10-CM | POA: Insufficient documentation

## 2021-04-05 MED ORDER — LORAZEPAM 0.5 MG PO TABS: 0.5000 mg | ORAL_TABLET | Freq: Two times a day (BID) | ORAL | 1 refills | Status: DC | PRN

## 2021-04-05 MED ORDER — CITALOPRAM HYDROBROMIDE 20 MG PO TABS
30.0000 mg | ORAL_TABLET | Freq: Every day | ORAL | 0 refills | Status: DC
Start: 2021-04-05 — End: 2021-08-03

## 2021-04-05 NOTE — Progress Notes (Signed)
   Ann Daugherty is a 50 y.o. female who presents today for a virtual office visit.  Assessment/Plan:  New/Acute Problems: COVID 19 Thankfully symptoms are relatively mild and are improving. Out of window for antiviral medications and she is overall low risk. Will continue with conservative management. Discussed reasons to return to care.   Chronic Problems Addressed Today: Anxiety Worsened recently due to COVID 19. Had a panic attack a few days ago.  She has been out of work.  She is interested in [employer].  She would like to go back to work on July 5.  I think this is reasonable.  We will place referral for her to see behavioral health.  We will increase her Celexa to 30 mg daily.  We will refill Ativan today.  She will check in with me in a couple weeks via MyChart.  Depression, major, single episode, mild (HCC) Symptoms worsened recently.  Will increase Celexa to 30 mg daily per patient request.  She will check in with me in a few weeks via MyChart.  Also place referral to behavioral health.     Subjective:  HPI:  Please see A/P for status of chronic conditions  She started having COVID symptoms 5 days ago.  Son was sick with COVID as well.  She has significant symptoms for the first 2 days but thankfully symptoms seem to be improving.  Still has occasional cough.  Her anxiety seems to be worsening.  She has been under a lot of stress recently.  She is on Celexa 20 mg daily and takes Ativan as needed.  She feels like overall medications are working well but is interested in possibly increasing the dose.  She has had to miss work due to overwhelming stress.  She had a panic attack at work recently.  Also had a panic attack at home a couple of days ago.  This past year has been particularly difficult for her.  She has went through divorce and she is also been dealing with the loss of her father who suffered a heart attack earlier this year.       Objective/Observations  Physical  Exam: Gen: NAD, resting comfortably Pulm: Normal work of breathing Neuro: Grossly normal, moves all extremities Psych: Normal affect and thought content  Virtual Visit via Video   I connected with Raylene Everts on 04/05/21 at 11:40 AM EDT by a video enabled telemedicine application and verified that I am speaking with the correct person using two identifiers. The limitations of evaluation and management by telemedicine and the availability of in person appointments were discussed. The patient expressed understanding and agreed to proceed.   Patient location: Home Provider location: [place] participating in the virtual visit: Myself and Patient     Algis Greenhouse. Jerline Pain, MD 04/05/2021 10:29 AM

## 2021-04-05 NOTE — Assessment & Plan Note (Signed)
Symptoms worsened recently.  Will increase Celexa to 30 mg daily per patient request.  She will check in with me in a few weeks via MyChart.  Also place referral to behavioral health.

## 2021-04-05 NOTE — Assessment & Plan Note (Addendum)
Worsened recently due to COVID 19. Had a panic attack a few days ago.  She has been out of work.  She is interested in [employer].  She would like to go back to work on July 5.  I think this is reasonable.  We will place referral for her to see behavioral health.  We will increase her Celexa to 30 mg daily.  We will refill Ativan today.  She will check in with me in a couple weeks via MyChart.

## 2021-06-08 ENCOUNTER — Telehealth: Payer: Self-pay | Admitting: *Deleted

## 2021-06-08 NOTE — Telephone Encounter (Signed)
PA Key: [id number] - Rx #: [id number] Sent to Plan today Drug Citalopram Hydrobromide '20MG'$  tablets Form Waiting for determination

## 2021-06-09 NOTE — Telephone Encounter (Signed)
Tried to contact Ann Daugherty left voicemail to call office.

## 2021-06-09 NOTE — Telephone Encounter (Signed)
Received a call from [place], Ann Daugherty is calling in about a prior authorization for a medication Citalopram Hydrobromide '20MG'$  tablets

## 2021-06-10 NOTE — Telephone Encounter (Signed)
Clarise Cruz called from [employer] regarding PA for Citalopram 30 mg . Answered all of Sara's questions she will forward information to the pharmacist and hopefully we will hear something soon if PA is approved.

## 2021-06-10 NOTE — Telephone Encounter (Signed)
Approvedtoday Effective from 06/08/2021 through 06/07/2022. Pharmacy notified

## 2021-08-03 ENCOUNTER — Telehealth (INDEPENDENT_AMBULATORY_CARE_PROVIDER_SITE_OTHER): Payer: BC Managed Care – PPO | Admitting: Physician Assistant

## 2021-08-03 ENCOUNTER — Encounter: Payer: Self-pay | Admitting: Physician Assistant

## 2021-08-03 VITALS — Ht 66.0 in | Wt 152.0 lb

## 2021-08-03 DIAGNOSIS — F419 Anxiety disorder, unspecified: Secondary | ICD-10-CM

## 2021-08-03 DIAGNOSIS — N951 Menopausal and female climacteric states: Secondary | ICD-10-CM | POA: Diagnosis not present

## 2021-08-03 MED ORDER — LORAZEPAM 0.5 MG PO TABS: 0.5000 mg | ORAL_TABLET | Freq: Two times a day (BID) | ORAL | 1 refills | Status: DC | PRN

## 2021-08-03 MED ORDER — NORETHINDRONE 0.35 MG PO TABS: 1.0000 | ORAL_TABLET | Freq: Every day | ORAL | 3 refills | Status: DC

## 2021-08-03 MED ORDER — CITALOPRAM HYDROBROMIDE 40 MG PO TABS: 40.0000 mg | ORAL_TABLET | Freq: Every day | ORAL | 1 refills | Status: DC

## 2021-08-03 NOTE — Progress Notes (Signed)
Virtual Visit via Video   I connected with Ann Daugherty on 08/03/21 at 10:00 AM EDT by a video enabled telemedicine application and verified that I am speaking with the correct person using two identifiers. Location patient: Home Location provider:  [facility], Office Persons participating in the virtual visit: [name], [name] PA-C, Anselmo Pickler, LPN   I discussed the limitations of evaluation and management by telemedicine and the availability of in person appointments. The patient expressed understanding and agreed to proceed.  I acted as a Education administrator for [employer], [employer], LPN   Subjective:   HPI:   Contraception Pt would like to discuss going on the mini pill for contraception and perimenopause symptoms. She is having severe PMS, irritable, mood changes. She is having monthly periods. Taking seed supplements x 2 months (flax seed, fish oil, chaste tree). She does not want to take any estrogen-containing products. Has had breakthrough bleeding in the past with them.  Anxiety Doing well with celexa 30 mg daily. She would like to increase to 40 mg daily. Denies SI/HI. Takes ativan prn.  ROS: See pertinent positives and negatives per HPI.  Patient Active Problem List   Diagnosis Date Noted   Depression, major, single episode, mild ([place]) 04/05/2021   Tobacco abuse 01/30/2018   Anxiety 01/30/2018   Attention deficit disorder 01/30/2018   Vitamin D deficiency 01/30/2018    Social History   Tobacco Use   Smoking status: Former    Packs/day: 0.50    Years: 30.00    Pack years: 15.00    Types: Cigarettes    Quit date: 04/06/2021    Years since quitting: 0.3   Smokeless tobacco: Never  Substance Use Topics   Alcohol use: Yes    Comment: 3-4 drinks a year    Current Outpatient Medications:    acetaminophen (TYLENOL) 500 MG tablet, Take 500 mg by mouth every 6 (six) hours as needed for mild pain., Disp: , Rfl:    calcium carbonate  (TUMS - DOSED IN MG ELEMENTAL CALCIUM) 500 MG chewable tablet, Chew 2 tablets by mouth as needed for indigestion or heartburn., Disp: , Rfl:    CHASTE TREE PO, Take 440 mg by mouth daily in the afternoon., Disp: , Rfl:    Cholecalciferol (VITAMIN D3) 5000 units CAPS, Take 1 capsule by mouth daily. , Disp: , Rfl:    citalopram (CELEXA) 40 MG tablet, Take 1 tablet (40 mg total) by mouth daily., Disp: 90 tablet, Rfl: 1   Cyanocobalamin (B-12) 2500 MCG SUBL, Place 1 tablet under the tongue daily., Disp: , Rfl:    Ibuprofen 200 MG CAPS, Take 400 mg by mouth daily as needed. , Disp: , Rfl:    Multiple Vitamin (MULTIVITAMIN WITH MINERALS) TABS tablet, Take 2 tablets by mouth daily., Disp: , Rfl:    nicotine (NICODERM CQ - DOSED IN MG/24 HOURS) 14 mg/24hr patch, Place 14 mg onto the skin daily., Disp: , Rfl:    norethindrone (ORTHO MICRONOR) 0.35 MG tablet, Take 1 tablet (0.35 mg total) by mouth daily., Disp: 84 tablet, Rfl: 3   OVER THE COUNTER MEDICATION, Take 2 capsules by mouth daily in the afternoon. Magnesium 400 mg, Vit B 6 25 mg, Disp: , Rfl:    OVER THE COUNTER MEDICATION, Omega 3 1600 mg 1 teaspoon days 1-14 of cycle, evening Primrose oil 11000 mg days 15 till cycle starts., Disp: , Rfl:    LORazepam (ATIVAN) 0.5 MG tablet, Take 1 tablet (  0.5 mg total) by mouth 2 (two) times daily as needed for anxiety., Disp: 30 tablet, Rfl: 1  No Known Allergies  Objective:   VITALS: Per patient if applicable, see vitals. GENERAL: Alert, appears well and in no acute distress. HEENT: Atraumatic, conjunctiva clear, no obvious abnormalities on inspection of external nose and ears. NECK: Normal movements of the head and neck. CARDIOPULMONARY: No increased WOB. Speaking in clear sentences. I:E ratio WNL.  MS: Moves all visible extremities without noticeable abnormality. PSYCH: Pleasant and cooperative, well-groomed. Speech normal rate and rhythm. Affect is appropriate. Insight and judgement are appropriate.  Attention is focused, linear, and appropriate.  NEURO: CN grossly intact. Oriented as arrived to appointment on time with no prompting. Moves both UE equally.  SKIN: No obvious lesions, wounds, erythema, or cyanosis noted on face or hands.  Assessment and Plan:   Ann Daugherty was seen today for contraception.  Diagnoses and all orders for this visit:  Anxiety Improving Increase celexa to 40 mg daily Refill prn ativan Follow-up in 6 months, sooner if concerns.  Menopausal symptoms Due to need for contraception and patient request, will trial micronor. Continue celexa, see above Denies concerns for pregnancy She declines all estrogen-containing products Follow-up in 1 year, sooner if concerns.  Other orders -     norethindrone (ORTHO MICRONOR) 0.35 MG tablet; Take 1 tablet (0.35 mg total) by mouth daily. -     citalopram (CELEXA) 40 MG tablet; Take 1 tablet (40 mg total) by mouth daily. -     LORazepam (ATIVAN) 0.5 MG tablet; Take 1 tablet (0.5 mg total) by mouth 2 (two) times daily as needed for anxiety.   I discussed the assessment and treatment plan with the patient. The patient was provided an opportunity to ask questions and all were answered. The patient agreed with the plan and demonstrated an understanding of the instructions.   The patient was advised to call back or seek an in-person evaluation if the symptoms worsen or if the condition fails to improve as anticipated.   CMA or LPN served as scribe during this visit. History, Physical, and Plan performed by medical provider. The above documentation has been reviewed and is accurate and complete.   [place], [place] 08/03/2021
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
## 2021-09-05 ENCOUNTER — Encounter: Payer: Self-pay | Admitting: Physician Assistant

## 2021-09-05 ENCOUNTER — Other Ambulatory Visit: Payer: Self-pay

## 2021-09-05 ENCOUNTER — Ambulatory Visit: Payer: BC Managed Care – PPO | Admitting: Physician Assistant

## 2021-09-05 VITALS — BP 100/64 | HR 88 | Temp 97.8°F | Ht 66.0 in | Wt 154.2 lb

## 2021-09-05 DIAGNOSIS — F320 Major depressive disorder, single episode, mild: Secondary | ICD-10-CM

## 2021-09-05 DIAGNOSIS — Z23 Encounter for immunization: Secondary | ICD-10-CM

## 2021-09-05 DIAGNOSIS — F419 Anxiety disorder, unspecified: Secondary | ICD-10-CM

## 2021-09-05 NOTE — Patient Instructions (Signed)
It was great to see you!  Continue celexa 40 mg daily  Please send [place] a message when you figure out what day you last worked.  Take care,  Inda Coke PA-C

## 2021-09-05 NOTE — Telephone Encounter (Signed)
I have placed her forms in your sign box. First day out of work was 9/26.

## 2021-09-06 ENCOUNTER — Other Ambulatory Visit: Payer: Self-pay | Admitting: Family Medicine

## 2021-09-20 ENCOUNTER — Telehealth: Payer: Self-pay | Admitting: *Deleted

## 2021-09-20 NOTE — Telephone Encounter (Signed)
Pt called back told her I need to know where to send the Accommodation form? Pt said fax to [employer] at [phone]. Told her okay will fax right now. Pt verbalized understanding.

## 2021-09-20 NOTE — Telephone Encounter (Signed)
Left message on voicemail to call office.  

## 2021-09-20 NOTE — Telephone Encounter (Signed)
Accommodation form faxed.

## 2022-02-25 ENCOUNTER — Other Ambulatory Visit: Payer: Self-pay | Admitting: Physician Assistant

## 2022-07-31 ENCOUNTER — Encounter: Payer: Self-pay | Admitting: *Deleted

## 2022-08-15 ENCOUNTER — Ambulatory Visit (INDEPENDENT_AMBULATORY_CARE_PROVIDER_SITE_OTHER): Payer: Self-pay | Admitting: Physician Assistant

## 2022-08-15 ENCOUNTER — Encounter: Payer: Self-pay | Admitting: Physician Assistant

## 2022-08-15 VITALS — BP 110/76 | HR 83 | Temp 98.2°F | Ht 66.0 in | Wt 161.0 lb

## 2022-08-15 DIAGNOSIS — F320 Major depressive disorder, single episode, mild: Secondary | ICD-10-CM

## 2022-08-15 DIAGNOSIS — J029 Acute pharyngitis, unspecified: Secondary | ICD-10-CM

## 2022-08-15 MED ORDER — LORAZEPAM 0.5 MG PO TABS: 0.5000 mg | ORAL_TABLET | Freq: Two times a day (BID) | ORAL | 1 refills | Status: DC | PRN

## 2022-08-15 MED ORDER — CITALOPRAM HYDROBROMIDE 40 MG PO TABS: 40.0000 mg | ORAL_TABLET | Freq: Every day | ORAL | 3 refills | Status: DC

## 2022-08-15 MED ORDER — PREDNISONE 20 MG PO TABS: 40.0000 mg | ORAL_TABLET | Freq: Every day | ORAL | 0 refills | Status: DC

## 2022-08-15 MED ORDER — CLINDAMYCIN HCL 300 MG PO CAPS: 300.0000 mg | ORAL_CAPSULE | Freq: Four times a day (QID) | ORAL | 0 refills | Status: AC

## 2022-08-15 NOTE — Patient Instructions (Signed)
It was great to see you!  I'm concerned you are developing a peri-tonsillar abscess Start antibiotics and prednisone  If any worsening, let us know.

## 2022-08-15 NOTE — Progress Notes (Addendum)
Ann Daugherty is a 51 y.o. female here for a new problem.  History of Present Illness:   Chief Complaint  Patient presents with   Sore Throat    Pt c/o sore throat since Friday, low grade fever and chills. Also having slight cough, nasal congestion.[facility] was [facility].     Sore Throat [facility] and tested negative for strep, but has pus pocket behind her right tonsil. She complains of coughing and having right ear pain as well. Denies: SOB, inability to control secretions, severe swelling of throat. Has taken ibuprofen regularly.   Depression She is regularly taking Celexa 40 mg and Ativan 0.5 mg to manage her mood. She is currently not seeing a therapist due to not having insurance.   Social history: She reports that she is not working anymore due to work anxiety.    Past Medical History:  Diagnosis Date   ADD (attention deficit disorder) 2015   Does not take any medication   GERD (gastroesophageal reflux disease)    History of chicken pox      Social History   Tobacco Use   Smoking status: Former    Packs/day: 0.50    Years: 30.00    Total pack years: 15.00    Types: Cigarettes    Quit date: 04/06/2021    Years since quitting: 1.3   Smokeless tobacco: Never  Vaping Use   Vaping Use: Former  Substance Use Topics   Alcohol use: Yes    Comment: 3-4 drinks a year   Drug use: No    Past Surgical History:  Procedure Laterality Date   BACK SURGERY     LAMINECTOMY     2007, 2011   VAGINAL DELIVERY  2004, 2006    Family History  Problem Relation Age of Onset   Breast cancer Mother 73   Colon cancer Mother 59   Asthma Mother    Hypertension Mother    Hearing loss Mother    Hypertension Father    Diabetes Father    Hyperlipidemia Father    Learning disabilities Son    ADD / ADHD Son    Ovarian cancer Maternal Grandmother    Hypertension Maternal Grandfather    Hyperlipidemia Maternal  Grandfather    Heart disease Maternal Grandfather    Stroke Maternal Grandfather    Mental illness Paternal Grandmother    Hypertension Paternal Grandfather     No Known Allergies  Current Medications:   Current Outpatient Medications:    acetaminophen (TYLENOL) 500 MG tablet, Take 500 mg by mouth every 6 (six) hours as needed for mild pain., Disp: , Rfl:    calcium carbonate (TUMS - DOSED IN MG ELEMENTAL CALCIUM) 500 MG chewable tablet, Chew 2 tablets by mouth as needed for indigestion or heartburn., Disp: , Rfl:    CHASTE TREE PO, Take 440 mg by mouth daily in the afternoon., Disp: , Rfl:    Cholecalciferol (VITAMIN D3) 5000 units CAPS, Take 1 capsule by mouth daily. , Disp: , Rfl:    clindamycin (CLEOCIN) 300 MG capsule, Take 1 capsule (300 mg total) by mouth 4 (four) times daily for 7 days., Disp: 28 capsule, Rfl: 0   Cyanocobalamin (B-12) 2500 MCG SUBL, Place 1 tablet under the tongue daily., Disp: , Rfl:    EVENING PRIMROSE OIL PO, Take by mouth. Takes days 15-28 till gets cycle., Disp: , Rfl:    Ibuprofen 200 MG CAPS,  Take 400 mg by mouth daily as needed. , Disp: , Rfl:    Multiple Vitamin (MULTIVITAMIN WITH MINERALS) TABS tablet, Take 2 tablets by mouth daily., Disp: , Rfl:    nicotine (NICODERM CQ - DOSED IN MG/24 HOURS) 14 mg/24hr patch, Place 14 mg onto the skin daily., Disp: , Rfl:    OVER THE COUNTER MEDICATION, Omega 3 1600 mg 1 teaspoon days 1-14 of cycle, evening Primrose oil 11000 mg days 15 till cycle starts., Disp: , Rfl:    predniSONE (DELTASONE) 20 MG tablet, Take 2 tablets (40 mg total) by mouth daily., Disp: 10 tablet, Rfl: 0   citalopram (CELEXA) 40 MG tablet, Take 1 tablet (40 mg total) by mouth daily., Disp: 90 tablet, Rfl: 3   LORazepam (ATIVAN) 0.5 MG tablet, Take 1 tablet (0.5 mg total) by mouth 2 (two) times daily as needed for anxiety., Disp: 30 tablet, Rfl: 1   Review of Systems:   Review of Systems  HENT:  Positive for ear pain and sore throat.    Respiratory:  Positive for cough.     Vitals:   Vitals:   08/15/22 0811  BP: 110/76  Pulse: 83  Temp: 98.2 F (36.8 C)  TempSrc: Temporal  SpO2: 96%  Weight: 161 lb (73 kg)  Height: '5\' 6"'$  (1.676 m)     Body mass index is 25.99 kg/m.  Physical Exam:   Physical Exam Constitutional:      General: She is not in acute distress.    Appearance: She is well-developed. She is not ill-appearing.  HENT:     Head: Normocephalic and atraumatic.     Right Ear: Ear canal normal.     Left Ear: Ear canal normal.     Mouth/Throat:     Tonsils: Tonsillar exudate present. 2+ on the right. 1+ on the left.  Eyes:     Pupils: Pupils are equal, round, and reactive to light.  Cardiovascular:     Rate and Rhythm: Normal rate and regular rhythm.     Heart sounds: No murmur heard.    No gallop.  Pulmonary:     Effort: Pulmonary effort is normal. No respiratory distress.     Breath sounds: Normal breath sounds. No wheezing or rales.  Skin:    General: Skin is warm and dry.  Neurological:     Mental Status: She is alert and oriented to person, place, and time.     Assessment and Plan:   Depression, major, single episode, mild ([facility]) Stable Continue Celexa 40 mg daily Continue Ativan 0.5 mg prn Follow-up in 1 year, sooner if concerns  Pharyngitis, unspecified etiology Concern for possible peritonsillar abscess She is refusing CT scan and ER presently due to cost Will start clindamycin and prednisone Provided her with some tongue depressors to evaluate this on her own  - she is an Therapist, sports -- low threshold to follow-up if any new/worsening symptoms   I,Param Shah,acting as a scribe for [employer], PA.,have documented all relevant documentation on the behalf of Inda Coke, PA,as directed by  Inda Coke, PA while in the presence of Inda Coke, [place].  I, Inda Coke, [place], have reviewed all documentation for this visit. The documentation on 08/15/22 for the exam,  diagnosis, procedures, and orders are all accurate and complete.   Inda Coke, PA-C

## 2023-07-08 DIAGNOSIS — C50212 Malignant neoplasm of upper-inner quadrant of left female breast: Secondary | ICD-10-CM

## 2023-07-08 HISTORY — DX: Malignant neoplasm of upper-inner quadrant of left female breast: C50.212

## 2023-07-10 ENCOUNTER — Ambulatory Visit: Payer: Self-pay | Admitting: Physician Assistant

## 2023-07-11 ENCOUNTER — Telehealth: Payer: Self-pay

## 2023-07-11 ENCOUNTER — Other Ambulatory Visit: Payer: Self-pay

## 2023-07-11 DIAGNOSIS — N6320 Unspecified lump in the left breast, unspecified quadrant: Secondary | ICD-10-CM

## 2023-07-11 NOTE — Telephone Encounter (Signed)
Patient called and stated that she a left breast breast in the upper, right side of the left breast x 6 months that is now ulcerated and is now bleeding x 1 week. Patient also has a smaller left breast on the lower part of the left breast. Patient stated initially the mass felt hard,she thought it was cyst as it painful with menses, placed bacitracin and a bandage on it, but it never scabbed. Also, places ice packs to soothe the area. Patient states her mother has history of breast cancer x 2, but is negative for brca gene. Patient has history of depression/anxiety is currently not working, has done anything about the mass as she did not want to cause any financial hardship with her family. Discussed [facility] services and [facility] medicaid if needed. [facility] appointment scheduled for 07/17/2023 at 10:45 am (address/directions to clinic provided), and [facility] appointment is 07/19/2023 @ 1:10 pm (arrive 20 min. Early).

## 2023-07-17 ENCOUNTER — Ambulatory Visit: Payer: Self-pay | Admitting: Hematology and Oncology

## 2023-07-17 ENCOUNTER — Other Ambulatory Visit (HOSPITAL_COMMUNITY): Payer: Self-pay

## 2023-07-17 VITALS — BP 120/74 | Wt 153.0 lb

## 2023-07-17 DIAGNOSIS — Z01419 Encounter for gynecological examination (general) (routine) without abnormal findings: Secondary | ICD-10-CM

## 2023-07-17 DIAGNOSIS — Z1211 Encounter for screening for malignant neoplasm of colon: Secondary | ICD-10-CM

## 2023-07-17 MED ORDER — CEPHALEXIN 500 MG PO CAPS
500.0000 mg | ORAL_CAPSULE | Freq: Two times a day (BID) | ORAL | 0 refills | Status: DC
  Filled 2023-07-17: qty 14, 7d supply, fill #0

## 2023-07-17 NOTE — Addendum Note (Signed)
Addended by: Ilda Basset on: 07/17/2023 11:42 AM   Modules accepted: Orders

## 2023-07-17 NOTE — Progress Notes (Signed)
Ann Daugherty is a 52 y.o. No obstetric history on file. female who presents to [facility] clinic today with complaint of left breast mass.    Pap Smear: Pap smear completed today. Last Pap smear was March 2019 and was normal. Per patient has no history of an abnormal Pap smear. Last Pap smear result is not available in Epic.   Physical exam: Breasts Breasts symmetrical. No nipple retraction bilateral breasts. No nipple discharge bilateral breasts. No lymphadenopathy. Left breast with approximately 8 cm mass located in the upper inner left breast with erosion through the skin. Second mass noted in the lower inner/outer quadrant left breast approximately 10 cm, hard and fixed.    Pelvic/Bimanual Ext Genitalia No lesions, no swelling and no discharge observed on external genitalia.        Vagina Vagina pink and normal texture. No lesions or discharge observed in vagina.        Cervix Cervix is present. Cervix pink and of normal texture. No discharge observed.    Uterus Uterus is present and palpable. Uterus in normal position and normal size.        Adnexae Bilateral ovaries present and palpable. No tenderness on palpation.         Rectovaginal No rectal exam completed today since patient had no rectal complaints. No skin abnormalities observed on exam.     Smoking History: Patient has is a former smoker and was not referred to quit line.    Patient Navigation: Patient education provided. Access to services provided for patient through [facility] program. No interpreter provided. No transportation provided   Colorectal Cancer Screening: Per patient has never had colonoscopy completed No complaints today. FIT test given.   Breast and Cervical Cancer Risk Assessment: Patient has family history of breast cancer, with her mother. Patient does not have history of cervical dysplasia, immunocompromised, or DES exposure in-utero.  Risk Scores as of Encounter on 07/17/2023     Ann Daugherty            5-year 2.04%   Lifetime 17.01%            Last calculated by Caprice Red, CMA on 07/17/2023 at 10:59 AM        A: [facility] exam with pap smear Complaint of left breast mass that has eroded through the skin. This area has been noticeable for the last several months. She recalls multiple mammograms revealing cysts and believed this area to be another cyst until it opened and was actively bleeding.   P: Referred patient to the [facility] for a diagnostic mammogram. Appointment scheduled 07/19/23.  Ilda Basset A, NP 07/17/2023 11:21 AM

## 2023-07-17 NOTE — Patient Instructions (Signed)
Taught Ann Daugherty about self breast awareness and gave educational materials to take home. Patient did need a Pap smear today due to last Pap smear was in 2019 per patient. Told patient about free cervical cancer screenings to receive a Pap smear if would like one next year. Let her know [facility] will cover Pap smears every 5 years unless has a history of abnormal Pap smears. Referred patient to the [facility] for diagnostic mammogram. Appointment scheduled for 07/19/23. Patient aware of appointment and will be there. Let patient know will follow up with her within the next couple weeks with results. Ann Daugherty verbalized understanding.  Pascal Lux, NP 11:30 AM

## 2023-07-18 ENCOUNTER — Other Ambulatory Visit: Payer: Self-pay

## 2023-07-18 DIAGNOSIS — Z122 Encounter for screening for malignant neoplasm of respiratory organs: Secondary | ICD-10-CM

## 2023-07-19 ENCOUNTER — Other Ambulatory Visit: Payer: Self-pay | Admitting: Obstetrics and Gynecology

## 2023-07-19 ENCOUNTER — Ambulatory Visit
Admission: RE | Admit: 2023-07-19 | Discharge: 2023-07-19 | Disposition: A | Discharge status: Home / Self Care | Payer: No Typology Code available for payment source | Source: Ambulatory Visit | Attending: Obstetrics and Gynecology | Admitting: Obstetrics and Gynecology

## 2023-07-19 ENCOUNTER — Ambulatory Visit
Admission: RE | Admit: 2023-07-19 | Discharge: 2023-07-19 | Disposition: A | Discharge status: Home / Self Care | Payer: Self-pay | Source: Ambulatory Visit | Attending: Obstetrics and Gynecology | Admitting: Obstetrics and Gynecology

## 2023-07-19 DIAGNOSIS — N6320 Unspecified lump in the left breast, unspecified quadrant: Secondary | ICD-10-CM

## 2023-07-19 HISTORY — PX: BREAST BIOPSY: SHX20

## 2023-07-20 ENCOUNTER — Encounter: Payer: Self-pay | Admitting: *Deleted

## 2023-07-20 ENCOUNTER — Telehealth: Payer: Self-pay | Admitting: *Deleted

## 2023-07-20 ENCOUNTER — Other Ambulatory Visit: Payer: Self-pay

## 2023-07-20 DIAGNOSIS — C50212 Malignant neoplasm of upper-inner quadrant of left female breast: Secondary | ICD-10-CM | POA: Insufficient documentation

## 2023-07-20 DIAGNOSIS — C50512 Malignant neoplasm of lower-outer quadrant of left female breast: Secondary | ICD-10-CM | POA: Insufficient documentation

## 2023-07-20 LAB — CYTOLOGY - PAP
Adequacy: ABSENT
Comment: NEGATIVE
Comment: NEGATIVE
Comment: NEGATIVE
Diagnosis: NEGATIVE
HPV 16: NEGATIVE
HPV 18 / 45: NEGATIVE
High risk HPV: POSITIVE — AB

## 2023-07-20 LAB — SURGICAL PATHOLOGY

## 2023-07-20 NOTE — Telephone Encounter (Signed)
Spoke to patient regarding navigation resources and education about her dx.  Confirmed appt with Dr. Pamelia Hoit for 9/19 at 1230pm. Referral placed for Dr. Basilio Cairo per her request.  She states she has not told her mother or 2 sons yet about her dx and does not want to until she has more information. Gave contact information and let her know to reach out if she has any questions. Patient verbalized understanding.

## 2023-07-20 NOTE — Addendum Note (Signed)
Addended by: Narda Rutherford on: 07/20/2023 01:34 PM   Modules accepted: Orders

## 2023-07-23 NOTE — Progress Notes (Addendum)
Location of Breast Cancer: Malignant neoplasm of upper-inner quadrant of left female breast, unspecified estrogen receptor status   Histology per Pathology Report:  07/19/2023 95 FINAL DIAGNOSIS       1. Breast, left, needle core biopsy, 5 o'clock mass, ribbon clip :      INVASIVE DUCTAL CARCINOMA WITH NECROSIS, SEE NOTE      TUBULE FORMATION: SCORE 3      NUCLEAR PLEOMORPHISM: SCORE 3      MITOTIC COUNT: SCORE 3      TOTAL SCORE: 9      OVERALL GRADE: 3      LYMPHOVASCULAR INVASION: NOT IDENTIFIED      CANCER LENGTH: 1.2 CM      CALCIFICATIONS: NOT IDENTIFIED      OTHER FINDINGS: NONE      SEE NOTE       2. Breast, left, needle core biopsy, 10:30 mass, coil clip :      INVASIVE DUCTAL CARCINOMA, SEE NOTE      DUCTAL CARCINOMA IN SITU, CRIBRIFORM, HIGH NUCLEAR GRADE WITH FOCAL NECROSIS      TUBULE FORMATION: SCORE 2      NUCLEAR PLEOMORPHISM: SCORE 3      MITOTIC COUNT: SCORE 2      TOTAL SCORE: 7      OVERALL GRADE: 2      LYMPHOVASCULAR INVASION: NOT IDENTIFIED      CANCER LENGTH: 1.7 CM      CALCIFICATIONS: NOT IDENTIFIED      OTHER FINDINGS: NONE      SEE NOTE       Diagnosis Note : Dr.Liu reviewed the case and concurs with the      interpretation.Breast prognostic profile (ER, PR, Ki-67 and HER2) is pending on      both of the specimens because of the different grades and will be reported in an      addendum.[facility] was notified on 07/20/2023.   Receptor Status: ER(95%), PR (90%), Her2-neu (Her2-), Ki-67(60%)  Did patient present with symptoms (if so, please note symptoms) or was this found on screening mammography?: palpable mass,patient thought area to left breast was a cyst.   Past/Anticipated interventions by surgeon, if ZOX:WRUE  patient plans to have left breast mastectomy by Dr. Laurelyn Sickle. Patient has consultation with Dr. Laurelyn Sickle today. She wishes to have double mastectomy.   Past/Anticipated interventions by medical oncology, if any: Pt to  see Dr. Pamelia Hoit on 07-26-23  Lymphedema issues, if any:  no    Pain issues, if any:  yes  reports tenderness to left breast.   SAFETY ISSUES: Prior radiation? no Pacemaker/ICD? no Possible current pregnancy?no, LPM 07/23/23. Is the patient on methotrexate? no  Current Complaints / other details:    BP 116/64   Pulse 86   Temp 98.5 F (36.9 C)   Resp 17   Ht 5\' 6"  (1.676 m)   Wt 151 lb 3.2 oz (68.6 kg)   LMP 06/26/2023 (Exact Date)   SpO2 99%   BMI 24.40 kg/m

## 2023-07-26 ENCOUNTER — Other Ambulatory Visit: Payer: Self-pay | Admitting: *Deleted

## 2023-07-26 ENCOUNTER — Inpatient Hospital Stay: Payer: Medicaid Other

## 2023-07-26 ENCOUNTER — Inpatient Hospital Stay: Payer: Medicaid Other | Attending: Hematology and Oncology | Admitting: Hematology and Oncology

## 2023-07-26 VITALS — BP 148/74 | HR 95 | Temp 97.7°F | Resp 18 | Ht 66.0 in | Wt 152.3 lb

## 2023-07-26 DIAGNOSIS — Z170 Estrogen receptor positive status [ER+]: Secondary | ICD-10-CM | POA: Diagnosis not present

## 2023-07-26 DIAGNOSIS — Z803 Family history of malignant neoplasm of breast: Secondary | ICD-10-CM | POA: Diagnosis not present

## 2023-07-26 DIAGNOSIS — C50212 Malignant neoplasm of upper-inner quadrant of left female breast: Secondary | ICD-10-CM | POA: Insufficient documentation

## 2023-07-26 DIAGNOSIS — Z87891 Personal history of nicotine dependence: Secondary | ICD-10-CM | POA: Diagnosis not present

## 2023-07-26 LAB — CBC WITH DIFFERENTIAL (CANCER CENTER ONLY)
Abs Immature Granulocytes: 0.02 10*3/uL (ref 0.00–0.07)
Basophils Absolute: 0 10*3/uL (ref 0.0–0.1)
Basophils Relative: 1 %
Eosinophils Absolute: 0 10*3/uL (ref 0.0–0.5)
Eosinophils Relative: 0 %
HCT: 35.4 % — ABNORMAL LOW (ref 36.0–46.0)
Hemoglobin: 12 g/dL (ref 12.0–15.0)
Immature Granulocytes: 0 %
Lymphocytes Relative: 26 %
Lymphs Abs: 1.7 10*3/uL (ref 0.7–4.0)
MCH: 30.8 pg (ref 26.0–34.0)
MCHC: 33.9 g/dL (ref 30.0–36.0)
MCV: 90.8 fL (ref 80.0–100.0)
Monocytes Absolute: 0.4 10*3/uL (ref 0.1–1.0)
Monocytes Relative: 6 %
Neutro Abs: 4.4 10*3/uL (ref 1.7–7.7)
Neutrophils Relative %: 67 %
Platelet Count: 342 10*3/uL (ref 150–400)
RBC: 3.9 MIL/uL (ref 3.87–5.11)
RDW: 12.5 % (ref 11.5–15.5)
WBC Count: 6.5 10*3/uL (ref 4.0–10.5)
nRBC: 0 % (ref 0.0–0.2)

## 2023-07-26 LAB — CMP (CANCER CENTER ONLY)
ALT: 12 U/L (ref 0–44)
AST: 15 U/L (ref 15–41)
Albumin: 4.2 g/dL (ref 3.5–5.0)
Alkaline Phosphatase: 73 U/L (ref 38–126)
Anion gap: 8 (ref 5–15)
BUN: 14 mg/dL (ref 6–20)
CO2: 25 mmol/L (ref 22–32)
Calcium: 9.4 mg/dL (ref 8.9–10.3)
Chloride: 104 mmol/L (ref 98–111)
Creatinine: 0.7 mg/dL (ref 0.44–1.00)
GFR, Estimated: >60 mL/min (ref >60)
Glucose, Bld: 100 mg/dL — ABNORMAL HIGH (ref 70–99)
Potassium: 3.8 mmol/L (ref 3.5–5.1)
Sodium: 137 mmol/L (ref 135–145)
Total Bilirubin: 0.3 mg/dL (ref 0.3–1.2)
Total Protein: 7.1 g/dL (ref 6.5–8.1)

## 2023-07-26 NOTE — Assessment & Plan Note (Signed)
07/19/2023:Palpable left breast mass for 6 months.  Mammogram and ultrasound revealed large masses.   10:30 position: 4.3 cm with skin thickening and ulceration: Grade 2 IDC with high-grade DCIS ER 95%, PR 95%, Ki67 30%, HER2 2+ by IHC, FISH negative ratio 1.37;  5 o'clock position: 3.3 cm with calcifications spanning 6.7 cm, axilla negative, biopsy: Grade 3 IDC with necrosis ER 95% PR 90% Ki-67 60% HER2 1+ negative  Pathology and radiology counseling: Discussed with the patient, the details of pathology including the type of breast cancer,the clinical staging, the significance of ER, PR and HER-2/neu receptors and the implications for treatment. After reviewing the pathology in detail, we proceeded to discuss the different treatment options between surgery, radiation, chemotherapy, antiestrogen therapies.  Treatment plan: Staging studies If no evidence of metastatic disease neoadjuvant chemotherapy with dose dense Adriamycin and Cytoxan x 4 followed by Taxol weekly x 12 Followed by mastectomy Adjuvant radiation therapy Antiestrogen therapy with CDK inhibitor  Chemotherapy Counseling: I discussed the risks and benefits of chemotherapy including the risks of nausea/ vomiting, risk of infection from low WBC count, fatigue due to chemo or anemia, bruising or bleeding due to low platelets, mouth sores, loss/ change in taste and decreased appetite. Liver and kidney function will be monitored through out chemotherapy as abnormalities in liver and kidney function may be a side effect of treatment. Cardiac dysfunction due to Adriamycin and neuropathy risk of Taxol were discussed in detail. Risk of permanent bone marrow dysfunction and leukemia due to chemo were also discussed.  Return to clinic after scans to finalize a treatment plan. Noted to do chemotherapy patient will need port, echo, chemo class

## 2023-07-26 NOTE — Progress Notes (Signed)
Radiation Oncology         [phone] ________________________________  Initial Outpatient Consultation  Name: Ann Daugherty MRN: [id number]  Date: 07/27/2023  DOB: [birth year]  CC:Ann Daugherty, [name]  Ann Croissant, MD   REFERRING PHYSICIAN: Serena Croissant, MD  DIAGNOSIS:    ICD-10-CM   1. Malignant neoplasm of upper-inner quadrant of left female breast, unspecified estrogen receptor status (HCC)  C50.212     2. Malignant neoplasm of upper-inner quadrant of left breast in female, estrogen receptor positive (HCC)  C50.212 Ambulatory referral to Social Work   Z17.0        Cancer Staging  Malignant neoplasm of upper-inner quadrant of left female breast (HCC) Staging form: Breast, AJCC 8th Edition - Clinical: Stage IIIB (cT4b, cN0, cM0, G3, ER+, PR+, HER2-) - Signed by Ann Croissant, MD on 07/26/2023 Histologic grading system: 3 grade system  Multifocal left breast cancer:   1) Left Breast, LOQ, Invasive Ductal Carcinoma, ER+ / PR+ / Her2- / Grade 3  2) Left Breast UOQ, Invasive ductal carcinoma with high-grade DCIS, ER+ / PR+ / Her2-, Grade 2  CHIEF COMPLAINT: Here to discuss management of left breast cancer  HISTORY OF PRESENT ILLNESS::Ann Daugherty is a 52 y.o. female who presented to medical attention earlier this month with 2 palpable masses in the left breast which she first noticed approximately 6 months ago. She reported that one of these masses began coming through her skin one month prior.   Subsequently, the patient presented for a bilateral breast mammogram and left breast ultrasound on 07/19/23 which showed: a suspicious 3.3 cm palpable mass with associated calcification in the 5 o'clock left breast, collectively spanning over 6 cm. A suspicious palpable mass was also demonstrated in the 10:30 o'clock left breast measuring 4.3 cm, associated with a large area of overlying skin ulceration.  No definite abnormal axillary lymph nodes identified.  Biopsies  collected on 07/19/23 are detailed as follows:  -- Biopsy of the 5 o'clock left breast mass showed grade 3 invasive ductal carcinoma measuring 1.2 cm in the greatest linear extent of the sample. ER status: 95% positive and PR status 90% positive, both with strong staining intensity; Proliferation marker Ki67 at 60%; Her2 status negative; Grade 3. -- Biopsy of the 10:30 o'clock left breast mass showed grade 2 invasive ductal carcinoma measuring 1.7 cm in the greatest linear extent of the sample, with high-grade DCIS and focal necrosis.  ER status: 95% positive and PR status 95% positive, both with strong staining intensity: Proliferation marker Ki67 at 30%; Her2 status negative; Grade 2.  Accordingly, the patient was seen in consultation by Dr. Pamelia Daugherty yesterday. If pending staging work-up shows no evidence of metastatic disease, Dr. Pamelia Daugherty recommends that she proceed with neoadjuvant chemotherapy consisting of dose dense Adriamycin and Cytoxan x 4 followed by Taxol weekly x 12. In terms of surgery, Dr. Pamelia Daugherty has recommended a mastectomy. She is scheduled to meet with Dr. Donell Daugherty in consultation today to discuss this further.   She is still coming to terms with undergoing a mastectomy.  She would like to speak with the plastic surgeon eventually.  She is tearful today.  She reports that she has noticed something was not right about her breast for the past 6 months but the ulceration from her breast is much more recent.  PREVIOUS RADIATION THERAPY: No  PAST MEDICAL HISTORY:  has a past medical history of ADD (attention deficit disorder) (2015), GERD (gastroesophageal reflux disease), and History of chicken pox.  PAST SURGICAL HISTORY: Past Surgical History:  Procedure Laterality Date   BACK SURGERY     BREAST BIOPSY Left 07/19/2023   [place] LT BREAST BX W LOC DEV EA ADD LESION IMG BX SPEC US GUIDE 07/19/2023 [facility]   BREAST BIOPSY Left 07/19/2023   [place] LT BREAST BX W LOC DEV 1ST LESION IMG BX  SPEC US GUIDE 07/19/2023 [facility]   LAMINECTOMY     2007, 2011   VAGINAL DELIVERY  2004, 2006    FAMILY HISTORY: family history includes ADD / ADHD in her son; Asthma in her mother; Breast cancer (age of onset: 76) in her mother; Colon cancer (age of onset: 68) in her mother; Diabetes in her father; Hearing loss in her mother; Heart disease in her maternal grandfather; Hyperlipidemia in her father and maternal grandfather; Hypertension in her father, maternal grandfather, mother, and paternal grandfather; Learning disabilities in her son; Mental illness in her paternal grandmother; Ovarian cancer in her maternal grandmother; Stroke in her maternal grandfather.  SOCIAL HISTORY:  reports that she quit smoking about 2 years ago. Her smoking use included cigarettes. She started smoking about 32 years ago. She has a 15 pack-year smoking history. She uses smokeless tobacco. She reports current alcohol use. She reports that she does not use drugs.  ALLERGIES: Patient has no known allergies.  MEDICATIONS:  Current Outpatient Medications  Medication Sig Dispense Refill   acetaminophen (TYLENOL) 500 MG tablet Take 500 mg by mouth every 6 (six) hours as needed for mild pain.     calcium carbonate (TUMS - DOSED IN MG ELEMENTAL CALCIUM) 500 MG chewable tablet Chew 2 tablets by mouth as needed for indigestion or heartburn.     CHASTE TREE PO Take 440 mg by mouth daily in the afternoon.     Cholecalciferol (VITAMIN D3) 5000 units CAPS Take 1 capsule by mouth daily.      citalopram (CELEXA) 40 MG tablet Take 1 tablet (40 mg total) by mouth daily. (Patient taking differently: Take 20 mg by mouth daily.) 90 tablet 3   Ibuprofen 200 MG CAPS Take 400 mg by mouth daily as needed.      LORazepam (ATIVAN) 0.5 MG tablet Take 1 tablet (0.5 mg total) by mouth 2 (two) times daily as needed for anxiety. 30 tablet 1   Multiple Vitamin (MULTIVITAMIN WITH MINERALS) TABS tablet Take 2 tablets by mouth daily.     No  current facility-administered medications for this encounter.    REVIEW OF SYSTEMS: As above in HPI.   PHYSICAL EXAM:  height is 5\' 6"  (1.676 m) and weight is 151 lb 3.2 oz (68.6 kg). Her temperature is 98.5 F (36.9 C). Her blood pressure is 116/64 and her pulse is 86. Her respiration is 17 and oxygen saturation is 99%.   General: Alert and oriented, in no acute distress HEENT: Head is normocephalic. Extraocular movements are intact.   Neck: Neck is supple, no palpable cervical or supraclavicular lymphadenopathy. Heart: Regular in rate and rhythm with no murmurs, rubs, or gallops. Chest: Clear to auscultation bilaterally, with no rhonchi, wheezes, or rales. Abdomen: Soft, nontender, nondistended, with no rigidity or guarding. Extremities: No cyanosis or edema. Lymphatics: see Neck Exam Skin: No concerning lesions. Musculoskeletal: symmetric strength and muscle tone throughout. Neurologic: Cranial nerves II through XII are grossly intact. No obvious focalities. Speech is fluent. Coordination is intact. Psychiatric: Judgment and insight are intact. Affect is appropriate. Breasts: Left breast is diffusely firm in all quadrants with ulcerative mass  in the upper inner quadrant.  There is some bleeding from this mass.. No other palpable masses appreciated in the breasts or axillae bilaterally.    ECOG = 1  0 - Asymptomatic (Fully active, able to carry on all predisease activities without restriction)  1 - Symptomatic but completely ambulatory (Restricted in physically strenuous activity but ambulatory and able to carry out work of a light or sedentary nature. For example, light housework, office work)  2 - Symptomatic, <50% in bed during the day (Ambulatory and capable of all self care but unable to carry out any work activities. Up and about more than 50% of waking hours)  3 - Symptomatic, >50% in bed, but not bedbound (Capable of only limited self-care, confined to bed or chair 50% or more  of waking hours)  4 - Bedbound (Completely disabled. Cannot carry on any self-care. Totally confined to bed or chair)  5 - Death   Santiago Glad MM, Creech RH, Tormey DC, et al. [phone]). "Toxicity and response criteria of the [facility]". Am. Evlyn Clines. Oncol. 5 (6): 649-55   LABORATORY DATA:  Lab Results  Component Value Date   WBC 6.5 07/26/2023   HGB 12.0 07/26/2023   HCT 35.4 (L) 07/26/2023   MCV 90.8 07/26/2023   PLT 342 07/26/2023   CMP     Component Value Date/Time   NA 137 07/26/2023 1406   K 3.8 07/26/2023 1406   CL 104 07/26/2023 1406   CO2 25 07/26/2023 1406   GLUCOSE 100 (H) 07/26/2023 1406   BUN 14 07/26/2023 1406   CREATININE 0.70 07/26/2023 1406   CREATININE 0.63 08/09/2020 1032   CALCIUM 9.4 07/26/2023 1406   PROT 7.1 07/26/2023 1406   ALBUMIN 4.2 07/26/2023 1406   AST 15 07/26/2023 1406   ALT 12 07/26/2023 1406   ALKPHOS 73 07/26/2023 1406   BILITOT 0.3 07/26/2023 1406   GFRNONAA >60 07/26/2023 1406         RADIOGRAPHY: [place] LT BREAST BX W LOC DEV 1ST LESION IMG BX SPEC US GUIDE  Addendum Date: 07/24/2023   ADDENDUM REPORT: 07/24/2023 07:26 ADDENDUM: Pathology revealed GRADE III INVASIVE DUCTAL CARCINOMA WITH NECROSIS of the LEFT breast, 5 o'clock mass, (ribbon clip). This was found to be concordant by Dr. Edwin Cap. Pathology revealed GRADE II INVASIVE DUCTAL CARCINOMA WITH NECROSIS, DUCTAL CARCINOMA IN SITU, CRIBRIFORM, HIGH NUCLEAR GRADE WITH FOCAL NECROSIS of the LEFT breast, 10:30 o'clock mass, (coil clip. This was found to be concordant by Dr. Edwin Cap. Pathology results were discussed with the patient by telephone. The patient reported doing well after the biopsies with tenderness and swelling at the sites. Post biopsy instructions and care were reviewed and questions were answered. The patient was encouraged to call [facility] for any additional concerns. My direct phone number was provided.  Surgical consultation has been arranged with Dr. Almond Lint, per patient request, at [facility] on July 23, 2023. Medical oncology consultation has been requested with Dr. Serena Daugherty, per patient request, at [facility]. Recommendation for a bilateral breast MRI and staging studies for further evaluation of extent of disease given the high grade histology and skin involvement. Pathology results reported by Rene Kocher, RN on 07/20/2023. Electronically Signed   By: Edwin Cap M.D.   On: 07/24/2023 07:26   Result Date: 07/24/2023 CLINICAL DATA:  Patient presents for ultrasound-guided core biopsy of a 3.3 cm mass in the left breast at the  5 o'clock position and ultrasound-guided core biopsy of a 4.3 cm mass in the left breast at the 10:30 position. EXAM: ULTRASOUND GUIDED LEFT BREAST CORE NEEDLE BIOPSY COMPARISON:  Previous exam(s). PROCEDURE: I met with the patient and we discussed the procedure of ultrasound-guided biopsy, including benefits and alternatives. We discussed the high likelihood of a successful procedure. We discussed the risks of the procedure, including infection, bleeding, tissue injury, clip migration, and inadequate sampling. Informed written consent was given. The usual time-out protocol was performed immediately prior to the procedure. SITE 1: LEFT BREAST 5 O'CLOCK 3.3 CM MASS WITH CALCIFICATIONS: Lesion quadrant: LOWER OUTER Using sterile technique and 1% Lidocaine as local anesthetic, under direct ultrasound visualization, a 14 gauge spring-loaded device was used to perform biopsy of the mass in the left breast the 5 o'clock position using a lateral to medial approach. At the conclusion of the procedure a ribbon shaped tissue marker clip was deployed into the biopsy cavity. Follow up 2 view mammogram was performed and dictated separately. SITE 2: LEFT BREAST 10:30 POSITION 4.3 CM MASS WITH OVERLYING SKIN ULCERATION: Lesion quadrant: UPPER INNER Using  sterile technique and 1% Lidocaine as local anesthetic, under direct ultrasound visualization, a 14 gauge spring-loaded device was used to perform biopsy of the mass in the left breast at the 10:30 position using a lateral to medial approach. At the conclusion of the procedure a coil shaped tissue marker clip was deployed into the biopsy cavity. Follow up 2 view mammogram was performed and dictated separately. IMPRESSION: 1. Ultrasound-guided core biopsy of the mass in the left breast at the 5 o'clock position, at site of ribbon shaped biopsy marking clip. 2. Ultrasound-guided core biopsy of the mass in the left breast at the 10:30 position, at site of coil shaped biopsy marking clip. Electronically Signed: By: Edwin Cap M.D. On: 07/19/2023 15:37   [place] LT BREAST BX W LOC DEV EA ADD LESION IMG BX SPEC US GUIDE  Addendum Date: 07/24/2023   ADDENDUM REPORT: 07/24/2023 07:26 ADDENDUM: Pathology revealed GRADE III INVASIVE DUCTAL CARCINOMA WITH NECROSIS of the LEFT breast, 5 o'clock mass, (ribbon clip). This was found to be concordant by Dr. Edwin Cap. Pathology revealed GRADE II INVASIVE DUCTAL CARCINOMA WITH NECROSIS, DUCTAL CARCINOMA IN SITU, CRIBRIFORM, HIGH NUCLEAR GRADE WITH FOCAL NECROSIS of the LEFT breast, 10:30 o'clock mass, (coil clip. This was found to be concordant by Dr. Edwin Cap. Pathology results were discussed with the patient by telephone. The patient reported doing well after the biopsies with tenderness and swelling at the sites. Post biopsy instructions and care were reviewed and questions were answered. The patient was encouraged to call [facility] for any additional concerns. My direct phone number was provided. Surgical consultation has been arranged with Dr. Almond Lint, per patient request, at [facility] on July 23, 2023. Medical oncology consultation has been requested with Dr. Serena Daugherty, per patient request, at [facility]. Recommendation for a bilateral breast MRI and staging studies for further evaluation of extent of disease given the high grade histology and skin involvement. Pathology results reported by Rene Kocher, RN on 07/20/2023. Electronically Signed   By: Edwin Cap M.D.   On: 07/24/2023 07:26   Result Date: 07/24/2023 CLINICAL DATA:  Patient presents for ultrasound-guided core biopsy of a 3.3 cm mass in the left breast at the 5 o'clock position and ultrasound-guided core biopsy of a 4.3 cm mass in the left breast at the  10:30 position. EXAM: ULTRASOUND GUIDED LEFT BREAST CORE NEEDLE BIOPSY COMPARISON:  Previous exam(s). PROCEDURE: I met with the patient and we discussed the procedure of ultrasound-guided biopsy, including benefits and alternatives. We discussed the high likelihood of a successful procedure. We discussed the risks of the procedure, including infection, bleeding, tissue injury, clip migration, and inadequate sampling. Informed written consent was given. The usual time-out protocol was performed immediately prior to the procedure. SITE 1: LEFT BREAST 5 O'CLOCK 3.3 CM MASS WITH CALCIFICATIONS: Lesion quadrant: LOWER OUTER Using sterile technique and 1% Lidocaine as local anesthetic, under direct ultrasound visualization, a 14 gauge spring-loaded device was used to perform biopsy of the mass in the left breast the 5 o'clock position using a lateral to medial approach. At the conclusion of the procedure a ribbon shaped tissue marker clip was deployed into the biopsy cavity. Follow up 2 view mammogram was performed and dictated separately. SITE 2: LEFT BREAST 10:30 POSITION 4.3 CM MASS WITH OVERLYING SKIN ULCERATION: Lesion quadrant: UPPER INNER Using sterile technique and 1% Lidocaine as local anesthetic, under direct ultrasound visualization, a 14 gauge spring-loaded device was used to perform biopsy of the mass in the left breast at the 10:30 position using a lateral to medial  approach. At the conclusion of the procedure a coil shaped tissue marker clip was deployed into the biopsy cavity. Follow up 2 view mammogram was performed and dictated separately. IMPRESSION: 1. Ultrasound-guided core biopsy of the mass in the left breast at the 5 o'clock position, at site of ribbon shaped biopsy marking clip. 2. Ultrasound-guided core biopsy of the mass in the left breast at the 10:30 position, at site of coil shaped biopsy marking clip. Electronically Signed: By: Edwin Cap M.D. On: 07/19/2023 15:37   MM CLIP PLACEMENT LEFT  Result Date: 07/19/2023 CLINICAL DATA:  Post ultrasound-guided core biopsy of a mass in the left breast at the 5 o'clock position and ultrasound-guided core biopsy of a mass in the left breast at the 10:30 position. EXAM: 3D DIAGNOSTIC LEFT MAMMOGRAM POST ULTRASOUND BIOPSY COMPARISON:  Previous exam(s). FINDINGS: 3D Mammographic images were obtained following ultrasound-guided core biopsy of a mass in the left breast at the 5 o'clock position and ultrasound-guided core biopsy of a mass in the left breast at the 10:30 position. A ribbon shaped biopsy marking clip is present the site of the biopsied mass in the left breast at the 5 o'clock position. A coil shaped biopsy marking clip is present at the site of the biopsied mass in the left breast at the 10:30 position. IMPRESSION: 1. Ribbon shaped biopsy marking clip at site of biopsied mass in the left breast at the 5 o'clock position. 2. Coil shaped biopsy marking clip at site of biopsied mass in the left breast at the 10:30 position. Final Assessment: Post Procedure Mammograms for Marker Placement BI-RADS CATEGORY  53M: Post-Procedure Mammogram for Marker Placement Electronically Signed   By: Edwin Cap M.D.   On: 07/19/2023 15:36   MS 3D DIAG MAMMO BILAT BR (aka MM)  Result Date: 07/19/2023 CLINICAL DATA:  52 year old female with 2 palpable palpable masses in the left breast which she noticed approximally 6  months ago. Patient states one of the masses started coming through the skin approximately 1 month ago. EXAM: DIGITAL DIAGNOSTIC BILATERAL MAMMOGRAM WITH TOMOSYNTHESIS AND CAD; ULTRASOUND LEFT BREAST LIMITED TECHNIQUE: Bilateral digital diagnostic mammography and breast tomosynthesis was performed. The images were evaluated with computer-aided detection. ; Targeted ultrasound examination of the left breast  was performed. COMPARISON:  Previous exam(s). ACR Breast Density Category c: The breasts are heterogeneously dense, which may obscure small masses. FINDINGS: There is an irregular mass involving the upper inner left breast measuring approximately 4 cm and broadly abutting the skin surface of the upper slightly inner left breast. There is an additional large irregular mass in the central to lower outer left breast measuring 3.8 cm. There are pleomorphic calcifications involving this mass with the mass and calcifications measuring up to 6.7 cm. There is associated skin thickening. There are no suspicious masses or calcifications seen in the right breast. Physical examination reveals enlargement of the left breast with a large palpable mass involving the central to lower outer left breast as well as upper slightly inner left breast. There is a large approximately 5 cm area of ulcerated skin overlying the mass in the upper slightly inner left breast. Targeted ultrasound of the left breast was performed. There is an irregular hypoechoic mass in the left breast at 5 o'clock 5 cm from nipple measuring 3.1 x 3.3 x 3.1 cm. There is an irregular hypoechoic mass in the left breast at the 10:30 position 11 cm from nipple measuring 4.3 x 2.7 x 3.3 cm. Adjacent skin thickening and subcutaneous edema surrounding this mass. No definite abnormal axillary lymph nodes identified. IMPRESSION: 1. Suspicious 3.3 cm palpable mass with associated calcifications in the left breast at the 5 o'clock position, with mass and calcifications  measuring over 6 cm. 2. Suspicious palpable mass in the left breast at the 10:30 position measuring 4.3 cm with associated large area of overlying skin ulceration. RECOMMENDATION: Recommend ultrasound-guided core biopsy of the 2 masses in the left breast at the 5 o'clock and 10 30 positions. I have discussed the findings and recommendations with the patient. If applicable, a reminder letter will be sent to the patient regarding the next appointment. BI-RADS CATEGORY  5: Highly suggestive of malignancy. Electronically Signed   By: Edwin Cap M.D.   On: 07/19/2023 14:49   [place] LIMITED ULTRASOUND INCLUDING AXILLA LEFT BREAST   Result Date: 07/19/2023 CLINICAL DATA:  52 year old female with 2 palpable palpable masses in the left breast which she noticed approximally 6 months ago. Patient states one of the masses started coming through the skin approximately 1 month ago. EXAM: DIGITAL DIAGNOSTIC BILATERAL MAMMOGRAM WITH TOMOSYNTHESIS AND CAD; ULTRASOUND LEFT BREAST LIMITED TECHNIQUE: Bilateral digital diagnostic mammography and breast tomosynthesis was performed. The images were evaluated with computer-aided detection. ; Targeted ultrasound examination of the left breast was performed. COMPARISON:  Previous exam(s). ACR Breast Density Category c: The breasts are heterogeneously dense, which may obscure small masses. FINDINGS: There is an irregular mass involving the upper inner left breast measuring approximately 4 cm and broadly abutting the skin surface of the upper slightly inner left breast. There is an additional large irregular mass in the central to lower outer left breast measuring 3.8 cm. There are pleomorphic calcifications involving this mass with the mass and calcifications measuring up to 6.7 cm. There is associated skin thickening. There are no suspicious masses or calcifications seen in the right breast. Physical examination reveals enlargement of the left breast with a large palpable mass  involving the central to lower outer left breast as well as upper slightly inner left breast. There is a large approximately 5 cm area of ulcerated skin overlying the mass in the upper slightly inner left breast. Targeted ultrasound of the left breast was performed. There is an irregular hypoechoic mass in  the left breast at 5 o'clock 5 cm from nipple measuring 3.1 x 3.3 x 3.1 cm. There is an irregular hypoechoic mass in the left breast at the 10:30 position 11 cm from nipple measuring 4.3 x 2.7 x 3.3 cm. Adjacent skin thickening and subcutaneous edema surrounding this mass. No definite abnormal axillary lymph nodes identified. IMPRESSION: 1. Suspicious 3.3 cm palpable mass with associated calcifications in the left breast at the 5 o'clock position, with mass and calcifications measuring over 6 cm. 2. Suspicious palpable mass in the left breast at the 10:30 position measuring 4.3 cm with associated large area of overlying skin ulceration. RECOMMENDATION: Recommend ultrasound-guided core biopsy of the 2 masses in the left breast at the 5 o'clock and 10 30 positions. I have discussed the findings and recommendations with the patient. If applicable, a reminder letter will be sent to the patient regarding the next appointment. BI-RADS CATEGORY  5: Highly suggestive of malignancy. Electronically Signed   By: Edwin Cap M.D.   On: 07/19/2023 14:49      IMPRESSION/PLAN: This is a very pleasant 52 year old woman with locally advanced left breast cancer.  Staging scans are pending.  If diffuse metastatic disease is ruled out, the plan is for curative treatment with neoadjuvant chemotherapy, followed by mastectomy.  Plastic surgery consultation is pending.  Ultimately she will benefit from adjuvant radiation therapy.  Anticipate 6 weeks of treatment covering the chest wall and regional nodes.  It was a pleasure meeting the patient today. We discussed the risks, benefits, and side effects of radiotherapy. I recommend  radiotherapy to reduce her risk of locoregional recurrence by 2/3.  We discussed that radiation would take approximately 6 weeks to complete and that I would give the patient a few weeks to heal following surgery before starting treatment planning.  We spoke about acute effects including skin irritation and fatigue as well as much less common late effects including internal organ injury or irritation.  We spoke about the likelihood of diffuse skin peeling.  We spoke about the risk of capsular contracture and cosmetic complications from radiation after reconstructive surgery.  We talked about the risk of a nonhealing wound, fibrosis, and scar tissue in the radiated areas.  We discussed the breath-hold technique to minimize risk to her heart.  We spoke in detail about the latest technology that is used to minimize the risk of late effects for patients undergoing radiotherapy to the breast or chest wall. No guarantees of treatment were given.  Consent was signed and put in her chart today. She is enthusiastic about proceeding with treatment. I look forward to participating in the patient's care.  I will await her referral back to me for postoperative follow-up and eventual CT simulation/treatment planning.  She is understandably tearful about the necessity for mastectomy and is enthusiastic about talking to a patient mentor in her age group who has undergone mastectomy in the past.  I also reach out to our nurse navigators about whether genetic counseling has been arranged yet so that they can navigate accordingly.  On date of service, in total, I spent 60 minutes on this encounter. Patient was seen in person.   __________________________________________   Lonie Peak, MD  This document serves as a record of services personally performed by Lonie Peak, MD. It was created on her behalf by Neena Rhymes, a trained medical scribe. The creation of this record is based on the scribe's personal observations and  the provider's statements to them. This document has  been checked and approved by the attending provider.

## 2023-07-27 ENCOUNTER — Ambulatory Visit
Admission: RE | Admit: 2023-07-27 | Discharge: 2023-07-27 | Disposition: A | Discharge status: Home / Self Care | Payer: Medicaid Other | Source: Ambulatory Visit | Attending: Radiation Oncology | Admitting: Radiation Oncology

## 2023-07-27 ENCOUNTER — Encounter: Payer: Self-pay | Admitting: *Deleted

## 2023-07-27 ENCOUNTER — Other Ambulatory Visit: Payer: Self-pay | Admitting: *Deleted

## 2023-07-27 ENCOUNTER — Encounter: Payer: Self-pay | Admitting: Radiation Oncology

## 2023-07-27 ENCOUNTER — Other Ambulatory Visit: Payer: Self-pay | Admitting: General Surgery

## 2023-07-27 VITALS — BP 116/64 | HR 86 | Temp 98.5°F | Resp 17 | Ht 66.0 in | Wt 151.2 lb

## 2023-07-27 DIAGNOSIS — Z170 Estrogen receptor positive status [ER+]: Secondary | ICD-10-CM | POA: Insufficient documentation

## 2023-07-27 DIAGNOSIS — Z87891 Personal history of nicotine dependence: Secondary | ICD-10-CM | POA: Insufficient documentation

## 2023-07-27 DIAGNOSIS — C50212 Malignant neoplasm of upper-inner quadrant of left female breast: Secondary | ICD-10-CM | POA: Insufficient documentation

## 2023-07-27 DIAGNOSIS — E785 Hyperlipidemia, unspecified: Secondary | ICD-10-CM | POA: Insufficient documentation

## 2023-07-27 DIAGNOSIS — Z803 Family history of malignant neoplasm of breast: Secondary | ICD-10-CM | POA: Insufficient documentation

## 2023-07-27 DIAGNOSIS — Z8041 Family history of malignant neoplasm of ovary: Secondary | ICD-10-CM | POA: Diagnosis not present

## 2023-07-27 DIAGNOSIS — K219 Gastro-esophageal reflux disease without esophagitis: Secondary | ICD-10-CM | POA: Insufficient documentation

## 2023-07-27 DIAGNOSIS — Z800 Family history of malignant neoplasm of digestive organs: Secondary | ICD-10-CM | POA: Diagnosis not present

## 2023-07-27 DIAGNOSIS — Z79899 Other long term (current) drug therapy: Secondary | ICD-10-CM | POA: Diagnosis not present

## 2023-07-27 DIAGNOSIS — C50112 Malignant neoplasm of central portion of left female breast: Secondary | ICD-10-CM | POA: Diagnosis not present

## 2023-07-27 DIAGNOSIS — Z809 Family history of malignant neoplasm, unspecified: Secondary | ICD-10-CM | POA: Diagnosis not present
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
## 2023-07-30 ENCOUNTER — Encounter: Payer: Self-pay | Admitting: *Deleted

## 2023-07-30 ENCOUNTER — Inpatient Hospital Stay: Payer: Medicaid Other

## 2023-07-30 NOTE — Progress Notes (Signed)
[facility] Clinical Social Work  Initial Assessment   Ann Daugherty is a 52 y.o. year old female contacted by phone. Clinical Social Work was referred by nurse navigator for assessment of psychosocial needs.   SDOH (Social Determinants of Health) assessments performed: No SDOH Interventions    Flowsheet Row Office Visit from 08/15/2022 in [facility] from 09/05/2021 in [facility] from 08/09/2020 in [facility] from 10/10/2019 in [facility] from 09/09/2019 in [facility]  SDOH Interventions       Depression Interventions/Treatment  Medication, Counseling Medication, Counseling Medication, Counseling Medication Counseling       SDOH Screenings   Food Insecurity: Food Insecurity Present (07/27/2023)  Housing: Low Risk  (07/27/2023)  Transportation Needs: No Transportation Needs (07/27/2023)  Utilities: Not At Risk (07/27/2023)  Depression (PHQ2-9): Low Risk  (07/27/2023)  Tobacco Use: Medium Risk (07/27/2023)   Received from [facility]     Distress Screen completed: No     No data to display            Family/Social Information:  Housing Arrangement: Patient lives with her son (age 45) Family members/support persons in your life? 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  Income source: No income and relying on retirement funds that were saved Financial concerns: Yes, due to illness and/or loss of work during treatment Type of concern: Utilities, Government social research officer, Phone, and Food Food access concerns: yes Religious or spiritual practice: Yes-does not need referral to spiritual services at this time  Coping/ Adjustment to diagnosis: Patient understands treatment  plan and what happens next? yes Concerns about diagnosis and/or treatment: Pain or discomfort during procedures, Feelings of anger or sadness, How I will care for other members of my family, Losing my job and/or losing income, Overwhelmed by information, Relationship with husband or partner, and How will I care for myself Patient reported stressors: Finances and Adjusting to my illness Hopes and/or priorities: To get through treatment Current coping skills/ strengths: Ability for insight , Average or above average intelligence , Capable of independent living , Communication skills , General fund of knowledge , Motivation for treatment/growth , Physical Health , Religious Affiliation , and Supportive family/friends     SUMMARY: Current SDOH Barriers:  Financial constraints related to basic needs and Limited social support  Clinical Social Work Clinical Goal(s):  Explore resources to assist with expressed needs: Corporate treasurer, Food Insecurity, and Adjusting to Illness   Interventions: Discussed common feeling and emotions when being diagnosed with cancer, and the importance of support during treatment Informed patient of the support team roles and support services at [facility] Provided CSW contact information and encouraged patient to call with any questions or concerns Completed referral for Dietician and Alight Peer Guide  Completed registration for Breast Cancer Support Group Completed email with additional programming flyers and information for supports for teens    Follow Up Plan: CSW will follow-up with patient by phone following follow up appointment with medical provider Patient verbalizes understanding of plan: Yes  Marguerita Merles, LCSWA Clinical Social Worker [facility]

## 2023-07-31 ENCOUNTER — Telehealth: Payer: Self-pay | Admitting: Hematology and Oncology

## 2023-07-31 ENCOUNTER — Encounter: Payer: Self-pay | Admitting: *Deleted

## 2023-07-31 DIAGNOSIS — C50212 Malignant neoplasm of upper-inner quadrant of left female breast: Secondary | ICD-10-CM

## 2023-08-01 ENCOUNTER — Ambulatory Visit (HOSPITAL_COMMUNITY)
Admission: RE | Admit: 2023-08-01 | Discharge: 2023-08-01 | Disposition: A | Discharge status: Home / Self Care | Payer: Medicaid Other | Source: Ambulatory Visit | Attending: Hematology and Oncology | Admitting: Hematology and Oncology

## 2023-08-01 DIAGNOSIS — K769 Liver disease, unspecified: Secondary | ICD-10-CM | POA: Diagnosis not present

## 2023-08-01 DIAGNOSIS — C50212 Malignant neoplasm of upper-inner quadrant of left female breast: Secondary | ICD-10-CM | POA: Diagnosis not present

## 2023-08-01 DIAGNOSIS — R590 Localized enlarged lymph nodes: Secondary | ICD-10-CM | POA: Diagnosis not present

## 2023-08-01 MED ORDER — SODIUM CHLORIDE (PF) 0.9 % IJ SOLN
INTRAMUSCULAR | Status: AC
  Filled 2023-08-01: qty 50

## 2023-08-01 MED ORDER — IOHEXOL 300 MG/ML  SOLN
100.0000 mL | Freq: Once | INTRAMUSCULAR | Status: AC | PRN
  Administered 2023-08-01: 100 mL via INTRAVENOUS

## 2023-08-02 ENCOUNTER — Ambulatory Visit (HOSPITAL_COMMUNITY)
Admission: RE | Admit: 2023-08-02 | Discharge: 2023-08-02 | Disposition: A | Discharge status: Home / Self Care | Payer: Medicaid Other | Source: Ambulatory Visit | Attending: Hematology and Oncology | Admitting: Hematology and Oncology

## 2023-08-02 DIAGNOSIS — R9230 Dense breasts, unspecified: Secondary | ICD-10-CM | POA: Diagnosis not present

## 2023-08-02 DIAGNOSIS — C50212 Malignant neoplasm of upper-inner quadrant of left female breast: Secondary | ICD-10-CM | POA: Insufficient documentation

## 2023-08-02 DIAGNOSIS — C50919 Malignant neoplasm of unspecified site of unspecified female breast: Secondary | ICD-10-CM | POA: Diagnosis not present

## 2023-08-02 MED ORDER — GADOBUTROL 1 MMOL/ML IV SOLN
7.0000 mL | Freq: Once | INTRAVENOUS | Status: AC | PRN
  Administered 2023-08-02: 7 mL via INTRAVENOUS

## 2023-08-03 ENCOUNTER — Encounter: Payer: Self-pay | Admitting: *Deleted

## 2023-08-03 ENCOUNTER — Telehealth: Payer: Self-pay | Admitting: *Deleted

## 2023-08-03 DIAGNOSIS — R928 Other abnormal and inconclusive findings on diagnostic imaging of breast: Secondary | ICD-10-CM

## 2023-08-03 DIAGNOSIS — C50212 Malignant neoplasm of upper-inner quadrant of left female breast: Secondary | ICD-10-CM

## 2023-08-03 DIAGNOSIS — C50512 Malignant neoplasm of lower-outer quadrant of left female breast: Secondary | ICD-10-CM

## 2023-08-03 NOTE — Telephone Encounter (Signed)
Spoke with patient regarding her recent MRI and the need for bx's on the right breast. Orders placed and msg sent to schedule.    Called reading room to request CT results be moved up to be read.

## 2023-08-06 ENCOUNTER — Encounter (HOSPITAL_BASED_OUTPATIENT_CLINIC_OR_DEPARTMENT_OTHER): Payer: Self-pay | Admitting: General Surgery

## 2023-08-06 ENCOUNTER — Encounter: Payer: Self-pay | Admitting: *Deleted

## 2023-08-06 ENCOUNTER — Ambulatory Visit (HOSPITAL_COMMUNITY)
Admission: RE | Admit: 2023-08-06 | Discharge: 2023-08-06 | Disposition: A | Discharge status: Home / Self Care | Payer: Medicaid Other | Source: Ambulatory Visit | Attending: Hematology and Oncology

## 2023-08-06 ENCOUNTER — Encounter: Payer: Self-pay | Admitting: Hematology and Oncology

## 2023-08-06 DIAGNOSIS — Z01818 Encounter for other preprocedural examination: Secondary | ICD-10-CM | POA: Insufficient documentation

## 2023-08-06 DIAGNOSIS — C50212 Malignant neoplasm of upper-inner quadrant of left female breast: Secondary | ICD-10-CM

## 2023-08-06 DIAGNOSIS — Z87891 Personal history of nicotine dependence: Secondary | ICD-10-CM | POA: Insufficient documentation

## 2023-08-06 DIAGNOSIS — Z0189 Encounter for other specified special examinations: Secondary | ICD-10-CM

## 2023-08-06 LAB — ECHOCARDIOGRAM COMPLETE
AR max vel: 2.64 cm2
AV Area VTI: 2.42 cm2
AV Area mean vel: 2.57 cm2
AV Mean grad: 4 mm[Hg]
AV Peak grad: 7.8 mm[Hg]
Ao pk vel: 1.4 m/s
Area-P 1/2: 3.77 cm2
S' Lateral: 2.1 cm

## 2023-08-07 DIAGNOSIS — Z9221 Personal history of antineoplastic chemotherapy: Secondary | ICD-10-CM

## 2023-08-07 DIAGNOSIS — C50919 Malignant neoplasm of unspecified site of unspecified female breast: Secondary | ICD-10-CM

## 2023-08-07 DIAGNOSIS — Z419 Encounter for procedure for purposes other than remedying health state, unspecified: Secondary | ICD-10-CM | POA: Diagnosis not present

## 2023-08-07 HISTORY — DX: Malignant neoplasm of unspecified site of unspecified female breast: C50.919

## 2023-08-07 HISTORY — DX: Personal history of antineoplastic chemotherapy: Z92.21

## 2023-08-08 ENCOUNTER — Encounter (HOSPITAL_COMMUNITY)
Admission: RE | Admit: 2023-08-08 | Discharge: 2023-08-08 | Disposition: A | Discharge status: Home / Self Care | Payer: Medicaid Other | Source: Ambulatory Visit | Attending: Hematology and Oncology | Admitting: Hematology and Oncology

## 2023-08-08 DIAGNOSIS — C50919 Malignant neoplasm of unspecified site of unspecified female breast: Secondary | ICD-10-CM | POA: Diagnosis not present

## 2023-08-08 DIAGNOSIS — C50212 Malignant neoplasm of upper-inner quadrant of left female breast: Secondary | ICD-10-CM | POA: Insufficient documentation

## 2023-08-08 MED ORDER — TECHNETIUM TC 99M MEDRONATE IV KIT
20.0000 | PACK | Freq: Once | INTRAVENOUS | Status: AC
  Administered 2023-08-08: 19.8 via INTRAVENOUS

## 2023-08-09 ENCOUNTER — Other Ambulatory Visit: Payer: Self-pay | Admitting: Hematology and Oncology

## 2023-08-09 ENCOUNTER — Ambulatory Visit
Admission: RE | Admit: 2023-08-09 | Discharge: 2023-08-09 | Disposition: A | Discharge status: Home / Self Care | Payer: Medicaid Other | Source: Ambulatory Visit | Attending: Hematology and Oncology | Admitting: Hematology and Oncology

## 2023-08-09 ENCOUNTER — Ambulatory Visit
Admission: RE | Admit: 2023-08-09 | Discharge: 2023-08-09 | Disposition: A | Discharge status: Home / Self Care | Payer: No Typology Code available for payment source | Source: Ambulatory Visit | Attending: Hematology and Oncology | Admitting: Hematology and Oncology

## 2023-08-09 DIAGNOSIS — C50212 Malignant neoplasm of upper-inner quadrant of left female breast: Secondary | ICD-10-CM

## 2023-08-09 DIAGNOSIS — R928 Other abnormal and inconclusive findings on diagnostic imaging of breast: Secondary | ICD-10-CM

## 2023-08-09 DIAGNOSIS — C50512 Malignant neoplasm of lower-outer quadrant of left female breast: Secondary | ICD-10-CM

## 2023-08-09 DIAGNOSIS — N6312 Unspecified lump in the right breast, upper inner quadrant: Secondary | ICD-10-CM | POA: Diagnosis not present

## 2023-08-09 DIAGNOSIS — D241 Benign neoplasm of right breast: Secondary | ICD-10-CM | POA: Diagnosis not present

## 2023-08-09 DIAGNOSIS — N6011 Diffuse cystic mastopathy of right breast: Secondary | ICD-10-CM | POA: Diagnosis not present

## 2023-08-09 DIAGNOSIS — N6313 Unspecified lump in the right breast, lower outer quadrant: Secondary | ICD-10-CM | POA: Diagnosis not present

## 2023-08-09 HISTORY — PX: BREAST BIOPSY: SHX20

## 2023-08-09 MED ORDER — CHLORHEXIDINE GLUCONATE CLOTH 2 % EX PADS: 6.0000 | MEDICATED_PAD | Freq: Once | CUTANEOUS | Status: DC

## 2023-08-09 NOTE — Progress Notes (Signed)

## 2023-08-10 ENCOUNTER — Other Ambulatory Visit: Payer: Self-pay | Admitting: Hematology and Oncology

## 2023-08-10 ENCOUNTER — Other Ambulatory Visit: Payer: Self-pay | Admitting: *Deleted

## 2023-08-10 ENCOUNTER — Other Ambulatory Visit (HOSPITAL_COMMUNITY): Payer: Self-pay

## 2023-08-10 ENCOUNTER — Other Ambulatory Visit: Payer: Self-pay | Admitting: Physician Assistant

## 2023-08-10 ENCOUNTER — Encounter: Payer: Self-pay | Admitting: *Deleted

## 2023-08-10 ENCOUNTER — Encounter: Payer: Self-pay | Admitting: Hematology and Oncology

## 2023-08-10 ENCOUNTER — Inpatient Hospital Stay: Payer: Medicaid Other | Attending: Hematology and Oncology | Admitting: Hematology and Oncology

## 2023-08-10 VITALS — BP 125/69 | HR 84 | Temp 97.3°F | Resp 18 | Ht 66.0 in | Wt 151.3 lb

## 2023-08-10 DIAGNOSIS — C787 Secondary malignant neoplasm of liver and intrahepatic bile duct: Secondary | ICD-10-CM | POA: Insufficient documentation

## 2023-08-10 DIAGNOSIS — Z5189 Encounter for other specified aftercare: Secondary | ICD-10-CM | POA: Insufficient documentation

## 2023-08-10 DIAGNOSIS — C50212 Malignant neoplasm of upper-inner quadrant of left female breast: Secondary | ICD-10-CM | POA: Diagnosis not present

## 2023-08-10 DIAGNOSIS — Z87891 Personal history of nicotine dependence: Secondary | ICD-10-CM | POA: Diagnosis not present

## 2023-08-10 DIAGNOSIS — Z853 Personal history of malignant neoplasm of breast: Secondary | ICD-10-CM | POA: Insufficient documentation

## 2023-08-10 DIAGNOSIS — Z5111 Encounter for antineoplastic chemotherapy: Secondary | ICD-10-CM | POA: Insufficient documentation

## 2023-08-10 DIAGNOSIS — Z170 Estrogen receptor positive status [ER+]: Secondary | ICD-10-CM | POA: Diagnosis not present

## 2023-08-10 DIAGNOSIS — R160 Hepatomegaly, not elsewhere classified: Secondary | ICD-10-CM | POA: Diagnosis not present

## 2023-08-10 DIAGNOSIS — K219 Gastro-esophageal reflux disease without esophagitis: Secondary | ICD-10-CM | POA: Diagnosis not present

## 2023-08-10 DIAGNOSIS — Z1211 Encounter for screening for malignant neoplasm of colon: Secondary | ICD-10-CM

## 2023-08-10 DIAGNOSIS — Z1212 Encounter for screening for malignant neoplasm of rectum: Secondary | ICD-10-CM

## 2023-08-10 LAB — SURGICAL PATHOLOGY

## 2023-08-10 MED ORDER — ONDANSETRON HCL 8 MG PO TABS
8.0000 mg | ORAL_TABLET | Freq: Three times a day (TID) | ORAL | 1 refills | Status: DC | PRN
  Filled 2023-08-10: qty 30, 10d supply, fill #0

## 2023-08-10 MED ORDER — LIDOCAINE-PRILOCAINE 2.5-2.5 % EX CREA
TOPICAL_CREAM | CUTANEOUS | 3 refills | Status: DC
  Filled 2023-08-10: qty 30, 30d supply, fill #0
  Filled 2023-12-24: qty 30, 30d supply, fill #1

## 2023-08-10 MED ORDER — DEXAMETHASONE 4 MG PO TABS
ORAL_TABLET | ORAL | 0 refills | Status: DC
  Filled 2023-08-10: qty 8, 2d supply, fill #0

## 2023-08-10 MED ORDER — PROCHLORPERAZINE MALEATE 10 MG PO TABS
10.0000 mg | ORAL_TABLET | Freq: Four times a day (QID) | ORAL | 1 refills | Status: DC | PRN
  Filled 2023-08-10: qty 30, 8d supply, fill #0

## 2023-08-10 MED ORDER — LORAZEPAM 0.5 MG PO TABS
0.5000 mg | ORAL_TABLET | Freq: Two times a day (BID) | ORAL | 1 refills | Status: DC | PRN
  Filled 2023-08-10: qty 30, 15d supply, fill #0
  Filled 2023-12-24: qty 30, 15d supply, fill #1

## 2023-08-10 NOTE — Progress Notes (Unsigned)
Ann Balm, MD  Leodis Rains D PROCEDURE / BIOPSY REVIEW Date: 08/10/23  Requested Biopsy site: Liver lesion Reason for request: r/o met Imaging review: Best seen on CT 9/25/.24  Decision: Approved Imaging modality to perform: Ultrasound Schedule with: Moderate Sedation Schedule for: Any VIR  Additional comments:   Please contact me with questions, concerns, or if issue pertaining to this request arise.  Ann Ann Balm, MD [facility]

## 2023-08-10 NOTE — Assessment & Plan Note (Signed)
07/19/2023:Palpable left breast mass for 6 months.  Mammogram and ultrasound revealed large masses.   10:30 position: 4.3 cm with skin thickening and ulceration: Grade 2 IDC with high-grade DCIS ER 95%, PR 95%, Ki67 30%, HER2 2+ by IHC, FISH negative ratio 1.37;  5 o'clock position: 3.3 cm with calcifications spanning 6.7 cm, axilla negative, biopsy: Grade 3 IDC with necrosis ER 95% PR 90% Ki-67 60% HER2 1+ negative  CT CAP 08/03/2023: 2 prominent left breast masses, left axillary lymph nodes, right hepatic lobe lesion concerning for metastatic breast cancer  Treatment plan: Ultrasound-guided liver biopsy Since this is a solitary metastasis, we can definitely try to consider doing neoadjuvant systemic chemotherapy and attempting to resect the breast and the liver. Mastectomy Adjuvant radiation Antiestrogen therapy with CDK inhibitors

## 2023-08-10 NOTE — Progress Notes (Signed)
START ON PATHWAY REGIMEN - Breast     Cycles 1 through 4: A cycle is every 14 days:     Doxorubicin      Cyclophosphamide      [id number]    Cycles 5 through 16: A cycle is every 7 days:     Paclitaxel   **Always confirm dose/schedule in your pharmacy ordering system**  Patient Characteristics: Preoperative or Nonsurgical Candidate, M0 (Clinical Staging), Up to cT4c, Any N, M0, Neoadjuvant Therapy followed by Surgery, Invasive Disease, Chemotherapy, HER2 Negative, ER Positive Therapeutic Status: Preoperative or Nonsurgical Candidate, M0 (Clinical Staging) AJCC M Category: cM0 AJCC Grade: G3 ER Status: Positive (+) AJCC 8 Stage Grouping: IIIB HER2 Status: Negative (-) AJCC T Category: cT4b AJCC N Category: cN0 PR Status: Positive (+) Breast Surgical Plan: Neoadjuvant Therapy followed by Surgery Intent of Therapy: Curative Intent, Discussed with Patient

## 2023-08-10 NOTE — Progress Notes (Signed)
Patient Care Team: [name], [name] as PCP - General (Physician Assistant) Obgyn, Ma Hillock as Consulting Physician (Obstetrics and Gynecology) Donalee Citrin, MD as Consulting Physician (Neurosurgery) Donnelly Angelica, RN as Oncology Nurse Navigator Pershing Proud, RN as Oncology Nurse Navigator Serena Croissant, MD as Consulting Physician (Hematology and Oncology) Almond Lint, MD as Consulting Physician (General Surgery) Lonie Peak, MD as Attending Physician (Radiation Oncology)  DIAGNOSIS:  Encounter Diagnosis  Name Primary?   Malignant neoplasm of upper-inner quadrant of left female breast, unspecified estrogen receptor status (HCC) Yes    SUMMARY OF ONCOLOGIC HISTORY: Oncology History  Malignant neoplasm of upper-inner quadrant of left female breast (HCC)  07/19/2023 Initial Diagnosis   Palpable left breast mass for 6 months.  Mammogram and ultrasound revealed large masses.  10:30 position: 4.3 cm with skin thickening and ulceration: Grade 2 IDC with high-grade DCIS ER 95%, PR 95%, Ki67 30%, HER2 2+ by IHC, FISH negative ratio 1.37; 5 o'clock position: 3.3 cm with calcifications spanning 6.7 cm, axilla negative, biopsy: Grade 3 IDC with necrosis ER 95% PR 90% Ki-67 60% HER2 1+ negative   07/26/2023 Cancer Staging   Staging form: Breast, AJCC 8th Edition - Clinical: Stage IIIB (cT4b, cN0, cM0, G3, ER+, PR+, HER2-) - Signed by Serena Croissant, MD on 07/26/2023 Histologic grading system: 3 grade system   08/15/2023 -  Chemotherapy   Patient is on Treatment Plan : BREAST ADJUVANT DOSE DENSE AC q14d / PACLitaxel q7d       CHIEF COMPLIANT: Follow-up to discuss results of scans and tests  History of Present Illness   AKB, a 52 year old premenopausal patient with a recent diagnosis of estrogen-positive breast cancer, presents with concerns about a potential metastasis to the liver. The patient is awaiting results from a liver biopsy, which was prompted by a spot detected on a recent  CT scan. The patient expresses anxiety about the potential progression of the disease and is eager to begin treatment.  The patient also reports a wound that is showing signs of possible infection, including increased drainage and yellowing. The patient is concerned about managing the wound at home and is considering seeking help from a wound care clinic.  In addition to the cancer diagnosis, the patient is experiencing erratic menstrual cycles, which she describes as "nutty." The patient is concerned about the production of estrogen and its potential impact on her cancer.  The patient is scheduled to have a port inserted for chemotherapy treatment and is eager to begin treatment as soon as possible.    ALLERGIES:  has No Known Allergies.  MEDICATIONS:  Current Outpatient Medications  Medication Sig Dispense Refill   acetaminophen (TYLENOL) 500 MG tablet Take 500 mg by mouth every 6 (six) hours as needed for mild pain.     calcium carbonate (TUMS - DOSED IN MG ELEMENTAL CALCIUM) 500 MG chewable tablet Chew 2 tablets by mouth as needed for indigestion or heartburn.     CHASTE TREE PO Take 440 mg by mouth daily in the afternoon.     Cholecalciferol (VITAMIN D3) 5000 units CAPS Take 1 capsule by mouth daily.      citalopram (CELEXA) 40 MG tablet Take 1 tablet (40 mg total) by mouth daily. (Patient taking differently: Take 20 mg by mouth daily.) 90 tablet 3   dexamethasone (DECADRON) 4 MG tablet Take 1 tablet by mouth the day after and 1 tablet 2 days after chemo 8 tablet 0   Ibuprofen 200 MG CAPS Take 400 mg  by mouth daily as needed.      lidocaine-prilocaine (EMLA) cream Apply to affected area once 30 g 3   Multiple Vitamin (MULTIVITAMIN WITH MINERALS) TABS tablet Take 2 tablets by mouth daily.     ondansetron (ZOFRAN) 8 MG tablet Take 1 tablet (8 mg total) by mouth every 8 (eight) hours as needed for nausea/vomiting. Start third day after doxorubicin/cyclophosphamide chemotherapy. 30 tablet 1    prochlorperazine (COMPAZINE) 10 MG tablet Take 1 tablet (10 mg total) by mouth every 6 (six) hours as needed for nausea or vomiting. 30 tablet 1   LORazepam (ATIVAN) 0.5 MG tablet Take 1 tablet (0.5 mg total) by mouth 2 (two) times daily as needed for anxiety. 30 tablet 1   No current facility-administered medications for this visit.   Facility-Administered Medications Ordered in Other Visits  Medication Dose Route Frequency Provider Last Rate Last Admin   Chlorhexidine Gluconate Cloth 2 % PADS 6 each  6 each Topical Once Almond Lint, MD       And   Chlorhexidine Gluconate Cloth 2 % PADS 6 each  6 each Topical Once Almond Lint, MD        PHYSICAL EXAMINATION: ECOG PERFORMANCE STATUS: 1 - Symptomatic but completely ambulatory  Vitals:   08/10/23 1003  BP: 125/69  Pulse: 84  Resp: 18  Temp: (!) 97.3 F (36.3 C)  SpO2: 100%   Filed Weights   08/10/23 1003  Weight: 151 lb 4.8 oz (68.6 kg)      LABORATORY DATA:  I have reviewed the data as listed    Latest Ref Rng & Units 07/26/2023    2:06 PM 08/09/2020   10:32 AM 02/04/2018    8:33 AM  CMP  Glucose 70 - 99 mg/dL 875  95  643   BUN 6 - 20 mg/dL 14  8  10    Creatinine 0.44 - 1.00 mg/dL 3.29  5.18  8.41   Sodium 135 - 145 mmol/L 137  137  138   Potassium 3.5 - 5.1 mmol/L 3.8  4.1  4.4   Chloride 98 - 111 mmol/L 104  107  104   CO2 22 - 32 mmol/L 25  21  28    Calcium 8.9 - 10.3 mg/dL 9.4  9.0  9.0   Total Protein 6.5 - 8.1 g/dL 7.1  6.6  6.8   Total Bilirubin 0.3 - 1.2 mg/dL 0.3  0.3  0.2   Alkaline Phos 38 - 126 U/L 73   85   AST 15 - 41 U/L 15  18  13    ALT 0 - 44 U/L 12  11  9      Lab Results  Component Value Date   WBC 6.5 07/26/2023   HGB 12.0 07/26/2023   HCT 35.4 (L) 07/26/2023   MCV 90.8 07/26/2023   PLT 342 07/26/2023   NEUTROABS 4.4 07/26/2023    ASSESSMENT & PLAN:  Malignant neoplasm of upper-inner quadrant of left female breast (HCC) 07/19/2023:Palpable left breast mass for 6 months.  Mammogram and  ultrasound revealed large masses.   10:30 position: 4.3 cm with skin thickening and ulceration: Grade 2 IDC with high-grade DCIS ER 95%, PR 95%, Ki67 30%, HER2 2+ by IHC, FISH negative ratio 1.37;  5 o'clock position: 3.3 cm with calcifications spanning 6.7 cm, axilla negative, biopsy: Grade 3 IDC with necrosis ER 95% PR 90% Ki-67 60% HER2 1+ negative  CT CAP 08/03/2023: 2 prominent left breast masses, left axillary lymph nodes, right hepatic  lobe lesion concerning for metastatic breast cancer  Treatment plan: Ultrasound-guided liver biopsy Since this is a solitary metastasis, we can definitely try to consider doing neoadjuvant systemic chemotherapy and attempting to resect the breast and the liver. Mastectomy Adjuvant radiation Antiestrogen therapy with CDK inhibitors  Menstrual Cycle Still active, potential source of estrogen. -Chemotherapy will likely induce menopause. -Consider shot to maintain menopause post-chemotherapy or potential oophorectomy.  Anxiety Patient experiencing anxiety, currently taking Ativan 0.5mg  as needed. -Continue Ativan 0.5mg  as needed, refill prescription.  Wound Care Patient concerned about wound care for breast lesion. -Refer to wound care clinic for management and education. -Consider home health if needed.  Insurance Recently enrolled in [employer]. -Work on authorizations for planned treatments.          Orders Placed This Encounter  Procedures   Consent Attestation for Oncology Treatment    Order Specific Question:   The patient is informed of risks, benefits, side-effects of the prescribed oncology treatment. Potential short term and long term side effects and response rates discussed. After a long discussion, the patient made informed decision to proceed.    Answer:   Yes   Pregnancy, urine    Standing Status:   Standing    Number of Occurrences:   16    Standing Expiration Date:   08/09/2024   CBC with Differential (Cancer Center Only)     Standing Status:   Future    Standing Expiration Date:   08/14/2024   CMP (Cancer Center only)    Standing Status:   Future    Standing Expiration Date:   08/14/2024   CBC with Differential (Cancer Center Only)    Standing Status:   Future    Standing Expiration Date:   08/28/2024   CMP (Cancer Center only)    Standing Status:   Future    Standing Expiration Date:   08/28/2024   CBC with Differential (Cancer Center Only)    Standing Status:   Future    Standing Expiration Date:   09/11/2024   CMP (Cancer Center only)    Standing Status:   Future    Standing Expiration Date:   09/11/2024   CBC with Differential (Cancer Center Only)    Standing Status:   Future    Standing Expiration Date:   09/25/2024   CMP (Cancer Center only)    Standing Status:   Future    Standing Expiration Date:   09/25/2024   CBC with Differential (Cancer Center Only)    Standing Status:   Future    Standing Expiration Date:   10/09/2024   CMP (Cancer Center only)    Standing Status:   Future    Standing Expiration Date:   10/09/2024   CBC with Differential (Cancer Center Only)    Standing Status:   Future    Standing Expiration Date:   10/16/2024   CMP (Cancer Center only)    Standing Status:   Future    Standing Expiration Date:   10/16/2024   CBC with Differential (Cancer Center Only)    Standing Status:   Future    Standing Expiration Date:   10/23/2024   CMP (Cancer Center only)    Standing Status:   Future    Standing Expiration Date:   10/23/2024   CBC with Differential (Cancer Center Only)    Standing Status:   Future    Standing Expiration Date:   10/30/2024   CMP (Cancer Center only)    Standing Status:  Future    Standing Expiration Date:   10/30/2024   CBC with Differential (Cancer Center Only)    Standing Status:   Future    Standing Expiration Date:   11/06/2024   CMP (Cancer Center only)    Standing Status:   Future    Standing Expiration Date:   11/06/2024   CBC with Differential  (Cancer Center Only)    Standing Status:   Future    Standing Expiration Date:   11/13/2024   CMP (Cancer Center only)    Standing Status:   Future    Standing Expiration Date:   11/13/2024   CBC with Differential (Cancer Center Only)    Standing Status:   Future    Standing Expiration Date:   11/20/2024   CMP (Cancer Center only)    Standing Status:   Future    Standing Expiration Date:   11/20/2024   CBC with Differential (Cancer Center Only)    Standing Status:   Future    Standing Expiration Date:   11/27/2024   CMP (Cancer Center only)    Standing Status:   Future    Standing Expiration Date:   11/27/2024   PHYSICIAN COMMUNICATION ORDER    A baseline Echo/ Muga should be obtained prior to initiation of Anthracycline Chemotherapy   The patient has a good understanding of the overall plan. she agrees with it. she will call with any problems that may develop before the next visit here. Total time spent: 30 mins including face to face time and time spent for planning, charting and co-ordination of care   Tamsen Meek, MD 08/10/23

## 2023-08-11 ENCOUNTER — Other Ambulatory Visit: Payer: Self-pay

## 2023-08-12 ENCOUNTER — Other Ambulatory Visit: Payer: Self-pay

## 2023-08-13 ENCOUNTER — Inpatient Hospital Stay: Payer: Medicaid Other

## 2023-08-13 ENCOUNTER — Other Ambulatory Visit: Payer: Self-pay

## 2023-08-13 ENCOUNTER — Encounter: Payer: Self-pay | Admitting: *Deleted

## 2023-08-13 ENCOUNTER — Inpatient Hospital Stay: Payer: Medicaid Other | Admitting: Pharmacist

## 2023-08-13 ENCOUNTER — Encounter (HOSPITAL_COMMUNITY): Payer: Self-pay | Admitting: General Surgery

## 2023-08-13 DIAGNOSIS — C50212 Malignant neoplasm of upper-inner quadrant of left female breast: Secondary | ICD-10-CM

## 2023-08-13 DIAGNOSIS — Z5111 Encounter for antineoplastic chemotherapy: Secondary | ICD-10-CM | POA: Diagnosis not present

## 2023-08-13 NOTE — Progress Notes (Signed)
PCP - [name], PA   Cardiologist - denies  PPM/ICD - denies Device Orders - n/a Rep Notified - n/a  Chest x-ray - denies EKG - denies Stress Test - denies ECHO - 08-06-23 (completed due to start of Ca medication) Cardiac Cath - denies  CPAP - denies  DM - denies  Blood Thinner Instructions: denies Aspirin Instructions: d/a  ERAS Protcol - clear liquid until 10:00  COVID TEST-  n/a  Per patient wound to left breast cleans with Dial soap covers with bandaid patient states Dr. Donell Beers aware of area  Anesthesia review: no  Patient verbally denies any shortness of breath, fever, cough and chest pain during phone call   -------------  SDW INSTRUCTIONS given:  Your procedure is scheduled on August 14, 2023.  Report to [facility] Main Entrance "A" at 1030 A.M., and check in at the Admitting office.  Call this number if you have problems the morning of surgery:  [phone]   Remember:  Do not eat after midnight the night before your surgery  You may drink clear liquids until 10:00 a.m. the morning of your surgery.   Clear liquids allowed are: Water, Non-Citrus Juices (without pulp), Carbonated Beverages, Clear Tea, Black Coffee Only, and Gatorade    Take these medicines the morning of surgery with A SIP OF WATER  citalopram (CELEXA)  IF NEEDED     As of today, STOP taking any Aspirin (unless otherwise instructed by your surgeon) Aleve, Naproxen, Ibuprofen, Motrin, Advil, Goody's, BC's, all herbal medications, fish oil, and all vitamins.                      Do not wear jewelry, make up, or nail polish            Do not wear lotions, powders, perfumes/colognes, or deodorant.            Do not shave 48 hours prior to surgery.  Men may shave face and neck.            Do not bring valuables to the hospital.            [facility] is not responsible for any belongings or valuables.  Do NOT Smoke (Tobacco/Vaping) 24 hours prior to your procedure If you use a  CPAP at night, you may bring all equipment for your overnight stay.   Contacts, glasses, dentures or bridgework may not be worn into surgery.      For patients admitted to the hospital, discharge time will be determined by your treatment team.   Patients discharged the day of surgery will not be allowed to drive home, and someone needs to stay with them for 24 hours.    Special instructions:   [place]- Preparing For Surgery  Before surgery, you can play an important role. Because skin is not sterile, your skin needs to be as free of germs as possible. You can reduce the number of germs on your skin by washing with CHG (chlorahexidine gluconate) Soap before surgery.  CHG is an antiseptic cleaner which kills germs and bonds with the skin to continue killing germs even after washing.    Oral Hygiene is also important to reduce your risk of infection.  Remember - BRUSH YOUR TEETH THE MORNING OF SURGERY WITH YOUR REGULAR TOOTHPASTE  Please do not use if you have an allergy to CHG or antibacterial soaps. If your skin becomes reddened/irritated stop using the CHG.  Do not shave (  including legs and underarms) for at least 48 hours prior to first CHG shower. It is OK to shave your face.  Please follow these instructions carefully.   Shower the NIGHT BEFORE SURGERY and the MORNING OF SURGERY with DIAL Soap.   Pat yourself dry with a CLEAN TOWEL.  Wear CLEAN PAJAMAS to bed the night before surgery  Place CLEAN SHEETS on your bed the night of your first shower and DO NOT SLEEP WITH PETS.   Day of Surgery: Please shower morning of surgery  Wear Clean/Comfortable clothing the morning of surgery Do not apply any deodorants/lotions.   Remember to brush your teeth WITH YOUR REGULAR TOOTHPASTE.   Questions were answered. Patient verbalized understanding of instructions.

## 2023-08-13 NOTE — Progress Notes (Signed)
Patient notified of location and time change of procedure on 08/14/2023. Procedure scheduled for 1300 08/14/2023 at [facility] Main OR and arrival time of 1100 per [facility]. Patient instructed to complete ensure pre surgery drink at 1000.

## 2023-08-13 NOTE — H&P (Signed)
REFERRING PHYSICIAN:  Derinda Late     PROVIDER:  Matthias Hughs, MD   Care Team: Patient Care Team: Adair Laundry, PA as PCP - General (Family Medicine) Matthias Hughs, MD as Consulting Provider (Surgical Oncology) Sabas Sous, MD (Hematology and Oncology) Buckner Malta, MD (Radiation Oncology)    MRN: [id number] DOB: 10-22-1971 DATE OF ENCOUNTER: 07/27/2023   Subjective    Chief Complaint: Left Breast Cancer   History of Present Illness: Ann Daugherty is a 52 y.o. female who is seen today as an office consultation at the request of Dr. Jolayne Panther for evaluation of Left Breast Cancer     Patient presents with new diagnosis of left breast cancer 07/2023.  She presented with two palpable masses with one of them having skin ulceration over the top.  They have been present for around 6 months, but only recently became this big with the skin change.  Diagnostic imaging showed a 3.8 cm mass at 5 o'clock with around 6.7 cm of calcifications and a 4 cm mass at 10:30 o'clock.  The 10:30 o'clock mass and around a 5 cm area of ulcerated skin.  Axilla was negative.  Core needle biopsies were performed.  The 5 o'clock mass had grade 3 invasive ductal carcinoma that is ER/PR positive, her 2 negative, and Ki 67 60%.  The 10:30 mass was grade 2 invasive ductal carcinoma, Er/PR +, her 2 negative and Ki 67 30%.       Family cancer history - mother is my [name], with colon and bilateral breast cancer.  Maternal grandmother had ovarian cancer.       Diagnostic mammogram and ultrasound [facility] 07/19/23 ACR Breast Density Category c: The breasts are heterogeneously dense, which may obscure small masses.  FINDINGS: There is an irregular mass involving the upper inner left breast measuring approximately 4 cm and broadly abutting the skin surface of the upper slightly inner left breast. There is an additional large irregular mass in the central to lower outer left  breast measuring 3.8 cm. There are pleomorphic calcifications involving this mass with the mass and calcifications measuring up to 6.7 cm. There is associated skin thickening. There are no suspicious masses or calcifications seen in the right breast.   Physical examination reveals enlargement of the left breast with a large palpable mass involving the central to lower outer left breast as well as upper slightly inner left breast. There is a large approximately 5 cm area of ulcerated skin overlying the mass in the upper slightly inner left breast.   Targeted ultrasound of the left breast was performed. There is an irregular hypoechoic mass in the left breast at 5 o'clock 5 cm from nipple measuring 3.1 x 3.3 x 3.1 cm. There is an irregular hypoechoic mass in the left breast at the 10:30 position 11 cm from nipple measuring 4.3 x 2.7 x 3.3 cm. Adjacent skin thickening and subcutaneous edema surrounding this mass. No definite abnormal axillary lymph nodes identified.   IMPRESSION: 1. Suspicious 3.3 cm palpable mass with associated calcifications in the left breast at the 5 o'clock position, with mass and calcifications measuring over 6 cm.   2. Suspicious palpable mass in the left breast at the 10:30 position measuring 4.3 cm with associated large area of overlying skin ulceration.   RECOMMENDATION: Recommend ultrasound-guided core biopsy of the 2 masses in the left breast at the 5 o'clock and 10 30 positions.   I have discussed the findings and recommendations with  the patient. If applicable, a reminder letter will be sent to the patient regarding the next appointment.   BI-RADS CATEGORY  5: Highly suggestive of malignancy.   Pathology core needle biopsy: 07/19/23 1. Breast, left, needle core biopsy, 5 o'clock mass, ribbon clip :      INVASIVE DUCTAL CARCINOMA WITH NECROSIS, SEE NOTE      TUBULE FORMATION: SCORE 3      NUCLEAR PLEOMORPHISM: SCORE 3      MITOTIC COUNT: SCORE  3      TOTAL SCORE: 9      OVERALL GRADE: 3      LYMPHOVASCULAR INVASION: NOT IDENTIFIED      CANCER LENGTH: 1.2 CM      CALCIFICATIONS: NOT IDENTIFIED      OTHER FINDINGS: NONE      SEE NOTE       2. Breast, left, needle core biopsy, 10:30 mass, coil clip :      INVASIVE DUCTAL CARCINOMA, SEE NOTE      DUCTAL CARCINOMA IN SITU, CRIBRIFORM, HIGH NUCLEAR GRADE WITH FOCAL NECROSIS      TUBULE FORMATION: SCORE 2      NUCLEAR PLEOMORPHISM: SCORE 3      MITOTIC COUNT: SCORE 2      TOTAL SCORE: 7      OVERALL GRADE: 2      LYMPHOVASCULAR INVASION: NOT IDENTIFIED      CANCER LENGTH: 1.7 CM      CALCIFICATIONS: NOT IDENTIFIED      OTHER FINDINGS: NONE      SEE NOTE    Receptors: #1 -The tumor cells are NEGATIVE for Her2 (1+).  Estrogen Receptor:  95%, POSITIVE, STRONG STAINING INTENSITY  Progesterone Receptor:  90%, POSITIVE, STRONG STAINING INTENSITY  Proliferation Marker Ki67:  60%  #2 -GROUP 5:   HER2 **NEGATIVE**  Estrogen Receptor:  95%, POSITIVE, STRONG STAINING INTENSITY  Progesterone Receptor:  95%, POSITIVE, STRONG STAINING INTENSITY  Proliferation Marker Ki67:  30%      Review of Systems: A complete review of systems was obtained from the patient.  I have reviewed this information and discussed as appropriate with the patient.  See HPI as well for other ROS. ROS - positive for depression and anxiety       Medical History: Past Medical History      Past Medical History:  Diagnosis Date   Anxiety          Problem List     Patient Active Problem List  Diagnosis   Malignant neoplasm of central portion of left breast in female, estrogen receptor positive (CMS/HHS-HCC)   Family history of cancer        Past Surgical History       Past Surgical History:  Procedure Laterality Date   L4-L5 Laminectomy   2007   L4-L5 Laminectomy   2011        Allergies  No Known Allergies     Medications Ordered Prior to Encounter        Current Outpatient Medications  on File Prior to Visit  Medication Sig Dispense Refill   citalopram (CELEXA) 40 MG tablet Take 40 mg by mouth once daily       LORazepam (ATIVAN) 0.5 MG tablet Take 0.5 mg by mouth 2 (two) times daily as needed       acetaminophen (TYLENOL) 500 MG tablet Take 500 mg by mouth every 6 (six) hours as needed       calcium carbonate (TUMS) 200  mg calcium (500 mg) chewable tablet Take by mouth        No current facility-administered medications on file prior to visit.        Family History       Family History  Problem Relation Age of Onset   Obesity Mother     High blood pressure (Hypertension) Mother     Colon cancer Mother     Breast cancer Mother     High blood pressure (Hypertension) Father     Hyperlipidemia (Elevated cholesterol) Father     Coronary Artery Disease (Blocked arteries around heart) Father          Tobacco Use History  Social History        Tobacco Use  Smoking Status Former   Types: Cigarettes  Smokeless Tobacco Never        Social History  Social History         Socioeconomic History   Marital status: Divorced  Tobacco Use   Smoking status: Former      Types: Cigarettes   Smokeless tobacco: Never  Vaping Use   Vaping status: Every Day  Substance and Sexual Activity   Alcohol use: Yes      Alcohol/week: 2.0 - 3.0 standard drinks of alcohol      Types: 2 - 3 Standard drinks or equivalent per week   Drug use: Never    Social Determinants of Health        Food Insecurity: Food Insecurity Present (07/27/2023)    Received from [facility]    Hunger Vital Sign     Worried About Running Out of Food in the Last Year: Sometimes true     Ran Out of Food in the Last Year: Sometimes true  Transportation Needs: No Transportation Needs (07/27/2023)    Received from [facility]     Lack of Transportation (Medical): No     Lack of Transportation (Non-Medical): No        Objective:         Vitals:    07/27/23 1140   BP: 110/68  Pulse: 110  Temp: 37 C (98.6 F)  SpO2: 98%  Weight: 69 kg (152 lb 3.2 oz)  Height: 167.6 cm (5\' 6" )  PainSc: 0-No pain    Body mass index is 24.57 kg/m.   Gen:  No acute distress.  Well nourished and well groomed.   Neurological: Alert and oriented to person, place, and time. Coordination normal.  Head: Normocephalic and atraumatic.  Eyes: Conjunctivae are normal. Pupils are equal, round, and reactive to light. No scleral icterus.  Neck: Normal range of motion. Neck supple. No tracheal deviation or thyromegaly present.  Cardiovascular: Normal rate, regular rhythm, normal heart sounds and intact distal pulses.  Exam reveals no gallop and no friction rub.  No murmur heard. Breast: left breast contracted a bit, larger than right breast which has some mild ptosis.  Breasts are relatively dense bilaterally.  Palpable mass LOQ 4 cm.  Palpable mass UIQ 4-5 cm with skin ulceration.  See picture below.  No nipple retraction or discharge.  No skin dimpling or lymphedema.   Respiratory: Effort normal.  No respiratory distress. No chest wall tenderness. Breath sounds normal.  No wheezes, rales or rhonchi.  GI: Soft. Bowel sounds are normal. The abdomen is soft and nontender.  There is no rebound and no guarding.  Musculoskeletal: Normal range of motion. Extremities are nontender.  Lymphadenopathy: No cervical,  preauricular, postauricular or axillary adenopathy is present Skin: Skin is warm and dry. No rash noted. No diaphoresis. No erythema. No pallor. No clubbing, cyanosis, or edema.   Psychiatric: Normal mood and affect. Behavior is normal. Judgment and thought content normal.     Labs Labs 07/26/23 CBC normal other than slightly low HCT at 35.4 CMET normal other than gluc 100   Assessment and Plan:        ICD-10-CM    1. Malignant neoplasm of central portion of left breast in female, estrogen receptor positive (CMS/HHS-HCC)  C50.112 Ambulatory Referral to Plastic Surgery     Z17.0       2. Family history of cancer  Z80.9         Patient has new dx of left breast cancer [id number], hormone positive.  Patient has seen medical and radiation oncology.  Staging studies have been ordered as well as breast MRI.  Patient is offered genetic testing.  She is going to confer with her mother whose last cancer dx was in the last several years to see if it was repeated then.  She is sure her mother had negative testing x 2.    Plan is chemo if no metastatic disease is seen followed by bilateral mastectomy with left sentinel node biopsy.  I am referring her to plastic surgery for discussion of reconstruction options.  She is not a candidate for nipple preservation.     I reviewed port placement and wrote orders.  I discussed risks including pneumothorax, malposition, clot, malfunction.  I reviewed with a anatomic diagram/model.

## 2023-08-13 NOTE — Progress Notes (Signed)
[facility]       Telephone: [phone]?Fax: [phone]   Oncology Clinical Pharmacist Practitioner Initial Assessment  Ann Daugherty is a 52 y.o. female with a diagnosis of breast cancer. They were contacted today via in-person visit.  Indication/Regimen Doxorubicin (Adriamycin) and cyclophosphamide (Cytoxan) followed by paclitaxel (Taxol) are being used appropriately for treatment of breast cancer by Dr. Serena Croissant.      Wt Readings from Last 1 Encounters:  08/10/23 151 lb 4.8 oz (68.6 kg)    Estimated body surface area is 1.79 meters squared as calculated from the following:   Height as of 08/10/23: 5\' 6"  (1.676 m).   Weight as of 08/10/23: 151 lb 4.8 oz (68.6 kg).  The dosing regimen is every 14 days for 4 cycles  Doxorubicin (60 mg/m2) on Day 1 Cyclophosphamide (600 mg/m2) on Day 1 Pegfilgrastim (6 mg) on Day 3  Followed by a dosing regimen that is every 7 days for 12 cycles  Paclitaxel (80 mg/m2) on Day 1  It is planned to continue until treatment plan completion or unacceptable toxicity. The tentative start date is: 08/15/23  Dose Modifications Per Dr. Pamelia Hoit, no chemotherapy dose modifications   Allergies No Known Allergies  Vitals: No vitals or labs were done today for this chemotherapy education visit   Contraindications Contraindications were reviewed? Yes Contraindications to therapy were identified? No   Safety Precautions The following safety precautions were reviewed:  Fever: reviewed the importance of having a thermometer and the Centers for Disease Control and Prevention (CDC) definition of fever which is 100.7F (38C) or higher. Patient should call 24/7 triage at [phone] if experiencing a fever or any other symptoms Decreased white blood cells (WBCs) and increased risk for infection Decreased platelet count and increased risk of bleeding Decreased hemoglobin, part of the red blood cells that carry iron and  oxygen Change in the color of urine Fatigue Hair loss Nausea or vomiting Mouth irritation or sores Doxorubicin (vesicant) Cardiotoxicity Hemorrhagic cystitis Secondary cancers Muscle or joint pain or weakness Peripheral neuropathy Hypersensitivity reactions Avoid grapefruit products Intimacy, sexual activity, contraception, fertility Handling body fluids and waste  Medication Reconciliation Current Outpatient Medications  Medication Sig Dispense Refill   acetaminophen (TYLENOL) 500 MG tablet Take 500 mg by mouth every 6 (six) hours as needed for mild pain.     calcium carbonate (TUMS - DOSED IN MG ELEMENTAL CALCIUM) 500 MG chewable tablet Chew 2 tablets by mouth daily as needed for indigestion or heartburn.     CHASTE TREE PO Take 440 mg by mouth daily in the afternoon.     Cholecalciferol (VITAMIN D3) 5000 units CAPS Take 5,000 Units by mouth daily.     citalopram (CELEXA) 40 MG tablet Take 1 tablet (40 mg total) by mouth daily. (Patient taking differently: Take 20 mg by mouth daily.) 90 tablet 3   dexamethasone (DECADRON) 4 MG tablet Take 1 tablet by mouth the day after and 1 tablet 2 days after chemo 8 tablet 0   Ibuprofen 200 MG CAPS Take 400 mg by mouth daily as needed (Pain).     lidocaine-prilocaine (EMLA) cream Apply to affected area once 30 g 3   LORazepam (ATIVAN) 0.5 MG tablet Take 1 tablet (0.5 mg total) by mouth 2 (two) times daily as needed for anxiety. (Patient taking differently: Take 0.25 mg by mouth 2 (two) times daily.) 30 tablet 1   Multiple Vitamin (MULTIVITAMIN WITH MINERALS) TABS tablet Take 2 tablets by mouth  daily.     ondansetron (ZOFRAN) 8 MG tablet Take 1 tablet (8 mg total) by mouth every 8 (eight) hours as needed for nausea/vomiting. Start third day after doxorubicin/cyclophosphamide chemotherapy. 30 tablet 1   prochlorperazine (COMPAZINE) 10 MG tablet Take 1 tablet (10 mg total) by mouth every 6 (six) hours as needed for nausea or vomiting. 30 tablet 1    No current facility-administered medications for this visit.   Facility-Administered Medications Ordered in Other Visits  Medication Dose Route Frequency Provider Last Rate Last Admin   Chlorhexidine Gluconate Cloth 2 % PADS 6 each  6 each Topical Once Almond Lint, MD       And   Chlorhexidine Gluconate Cloth 2 % PADS 6 each  6 each Topical Once Almond Lint, MD        Medication reconciliation is based on the patient's most recent medication list in the electronic medical record (EMR) including herbal products and OTC medications.   The patient's medication list was reviewed today with the patient? Yes   Drug-drug interactions (DDIs) DDIs were evaluated? Yes Significant DDIs identified? No   Drug-Food Interactions Drug-food interactions were evaluated? Yes Drug-food interactions identified?  Avoid grapefruit products  Follow-up Plan  Treatment start date: 08/15/23 Port placement date: 08/14/23 ECHO date: 08/06/23 We reviewed the prescriptions, premedications, and treatment regimen with the patient. Possible side effects of the treatment regimen were reviewed and management strategies were discussed.  Can use loperamide as needed for diarrhea, loratadine as needed for G-CSF bone pain for doxorubicin + cyclophosphamide chemotherapy portion, and Senna-S as needed for constipation.  Clinical pharmacy will assist Dr. Serena Croissant and [name] on an as needed basis going forward  [name] participated in the discussion, expressed understanding, and voiced agreement with the above plan. All questions were answered to her satisfaction. The patient was advised to contact the clinic at [phone] with any questions or concerns prior to her return visit.   I spent 60 minutes assessing the patient.  Keiran Gaffey A. Odetta Pink, PharmD, BCOP, CPP  Anselm Lis, RPH-CPP, 08/13/2023 3:54 PM  **Disclaimer: This note was dictated with voice recognition software. Similar sounding  words can inadvertently be transcribed and this note may contain transcription errors which may not have been corrected upon publication of note.**

## 2023-08-14 ENCOUNTER — Other Ambulatory Visit (HOSPITAL_COMMUNITY): Payer: Self-pay

## 2023-08-14 ENCOUNTER — Ambulatory Visit (HOSPITAL_COMMUNITY)
Admission: RE | Admit: 2023-08-14 | Discharge: 2023-08-14 | Disposition: A | Discharge status: Home / Self Care | Payer: Medicaid Other | Attending: General Surgery | Admitting: General Surgery

## 2023-08-14 ENCOUNTER — Ambulatory Visit (HOSPITAL_COMMUNITY): Payer: Medicaid Other | Admitting: Anesthesiology

## 2023-08-14 ENCOUNTER — Ambulatory Visit (HOSPITAL_COMMUNITY): Payer: Medicaid Other

## 2023-08-14 ENCOUNTER — Other Ambulatory Visit: Payer: Self-pay

## 2023-08-14 ENCOUNTER — Encounter (HOSPITAL_COMMUNITY)
Admission: RE | Disposition: A | Discharge status: Home / Self Care | Payer: Self-pay | Source: Home / Self Care | Attending: General Surgery

## 2023-08-14 ENCOUNTER — Ambulatory Visit (HOSPITAL_BASED_OUTPATIENT_CLINIC_OR_DEPARTMENT_OTHER): Payer: Medicaid Other | Admitting: Anesthesiology

## 2023-08-14 DIAGNOSIS — K219 Gastro-esophageal reflux disease without esophagitis: Secondary | ICD-10-CM | POA: Diagnosis not present

## 2023-08-14 DIAGNOSIS — Z4682 Encounter for fitting and adjustment of non-vascular catheter: Secondary | ICD-10-CM | POA: Diagnosis not present

## 2023-08-14 DIAGNOSIS — C50912 Malignant neoplasm of unspecified site of left female breast: Secondary | ICD-10-CM | POA: Diagnosis not present

## 2023-08-14 DIAGNOSIS — Z95828 Presence of other vascular implants and grafts: Secondary | ICD-10-CM

## 2023-08-14 DIAGNOSIS — Z87891 Personal history of nicotine dependence: Secondary | ICD-10-CM | POA: Insufficient documentation

## 2023-08-14 DIAGNOSIS — C50112 Malignant neoplasm of central portion of left female breast: Secondary | ICD-10-CM | POA: Diagnosis not present

## 2023-08-14 DIAGNOSIS — C50212 Malignant neoplasm of upper-inner quadrant of left female breast: Secondary | ICD-10-CM | POA: Insufficient documentation

## 2023-08-14 HISTORY — DX: Anxiety disorder, unspecified: F41.9

## 2023-08-14 HISTORY — DX: Nausea with vomiting, unspecified: R11.2

## 2023-08-14 HISTORY — DX: Other specified postprocedural states: Z98.890

## 2023-08-14 HISTORY — PX: PORTACATH PLACEMENT: SHX2246

## 2023-08-14 HISTORY — DX: Presence of other vascular implants and grafts: Z95.828

## 2023-08-14 HISTORY — DX: Depression, unspecified: F32.A

## 2023-08-14 HISTORY — DX: Malignant (primary) neoplasm, unspecified: C80.1

## 2023-08-14 SURGERY — INSERTION, TUNNELED CENTRAL VENOUS DEVICE, WITH PORT
Anesthesia: General | Site: Chest | Laterality: Right

## 2023-08-14 MED ORDER — 0.9 % SODIUM CHLORIDE (POUR BTL) OPTIME
TOPICAL | Status: DC | PRN
  Administered 2023-08-14: 1000 mL

## 2023-08-14 MED ORDER — MIDAZOLAM HCL 2 MG/2ML IJ SOLN
INTRAMUSCULAR | Status: DC | PRN
  Administered 2023-08-14: 2 mg via INTRAVENOUS

## 2023-08-14 MED ORDER — OXYCODONE HCL 5 MG PO TABS
5.0000 mg | ORAL_TABLET | Freq: Four times a day (QID) | ORAL | 0 refills | Status: DC | PRN
  Filled 2023-08-14: qty 8, 2d supply, fill #0

## 2023-08-14 MED ORDER — PROPOFOL 500 MG/50ML IV EMUL
INTRAVENOUS | Status: DC | PRN
Start: 2023-08-14 — End: 2023-08-14
  Administered 2023-08-14: 150 ug/kg/min via INTRAVENOUS

## 2023-08-14 MED ORDER — LIDOCAINE HCL 1 % IJ SOLN
INTRAMUSCULAR | Status: DC | PRN
  Administered 2023-08-14: 10 mL

## 2023-08-14 MED ORDER — HYDROMORPHONE HCL 1 MG/ML IJ SOLN
INTRAMUSCULAR | Status: AC
  Filled 2023-08-14: qty 1

## 2023-08-14 MED ORDER — ONDANSETRON HCL 4 MG/2ML IJ SOLN
INTRAMUSCULAR | Status: AC
  Filled 2023-08-14: qty 2

## 2023-08-14 MED ORDER — ONDANSETRON HCL 4 MG/2ML IJ SOLN: 4.0000 mg | Freq: Once | INTRAMUSCULAR | Status: DC | PRN

## 2023-08-14 MED ORDER — LIDOCAINE 2% (20 MG/ML) 5 ML SYRINGE
INTRAMUSCULAR | Status: AC
  Filled 2023-08-14: qty 5

## 2023-08-14 MED ORDER — BACITRACIN ZINC 500 UNIT/GM EX OINT
TOPICAL_OINTMENT | CUTANEOUS | Status: AC
  Filled 2023-08-14: qty 28.35

## 2023-08-14 MED ORDER — ACETAMINOPHEN 500 MG PO TABS
1000.0000 mg | ORAL_TABLET | Freq: Once | ORAL | Status: DC
  Filled 2023-08-14: qty 2

## 2023-08-14 MED ORDER — BUPIVACAINE-EPINEPHRINE (PF) 0.25% -1:200000 IJ SOLN
INTRAMUSCULAR | Status: AC
  Filled 2023-08-14: qty 30

## 2023-08-14 MED ORDER — ACETAMINOPHEN 500 MG PO TABS
1000.0000 mg | ORAL_TABLET | ORAL | Status: AC
  Administered 2023-08-14: 1000 mg via ORAL

## 2023-08-14 MED ORDER — FENTANYL CITRATE (PF) 250 MCG/5ML IJ SOLN
INTRAMUSCULAR | Status: AC
  Filled 2023-08-14: qty 5

## 2023-08-14 MED ORDER — PROPOFOL 10 MG/ML IV BOLUS
INTRAVENOUS | Status: AC
  Filled 2023-08-14: qty 20

## 2023-08-14 MED ORDER — HEPARIN 6000 UNIT IRRIGATION SOLUTION
Status: AC
  Filled 2023-08-14: qty 500

## 2023-08-14 MED ORDER — DEXAMETHASONE SODIUM PHOSPHATE 10 MG/ML IJ SOLN
INTRAMUSCULAR | Status: AC
  Filled 2023-08-14: qty 1

## 2023-08-14 MED ORDER — PROPOFOL 10 MG/ML IV BOLUS
INTRAVENOUS | Status: DC | PRN
  Administered 2023-08-14: 50 mg via INTRAVENOUS
  Administered 2023-08-14: 150 mg via INTRAVENOUS

## 2023-08-14 MED ORDER — HEPARIN 6000 UNIT IRRIGATION SOLUTION
Status: DC | PRN
  Administered 2023-08-14: 1

## 2023-08-14 MED ORDER — CHLORHEXIDINE GLUCONATE 0.12 % MT SOLN
OROMUCOSAL | Status: AC
  Filled 2023-08-14: qty 15

## 2023-08-14 MED ORDER — OXYCODONE HCL 5 MG/5ML PO SOLN: 5.0000 mg | Freq: Once | ORAL | Status: DC | PRN

## 2023-08-14 MED ORDER — CEFAZOLIN SODIUM-DEXTROSE 2-4 GM/100ML-% IV SOLN
2.0000 g | INTRAVENOUS | Status: AC
  Administered 2023-08-14: 2 g via INTRAVENOUS
  Filled 2023-08-14: qty 100

## 2023-08-14 MED ORDER — HEPARIN SOD (PORK) LOCK FLUSH 100 UNIT/ML IV SOLN
INTRAVENOUS | Status: AC
  Filled 2023-08-14: qty 5

## 2023-08-14 MED ORDER — PHENYLEPHRINE 80 MCG/ML (10ML) SYRINGE FOR IV PUSH (FOR BLOOD PRESSURE SUPPORT)
PREFILLED_SYRINGE | INTRAVENOUS | Status: DC | PRN
  Administered 2023-08-14 (×2): 80 ug via INTRAVENOUS

## 2023-08-14 MED ORDER — HYDROMORPHONE HCL 1 MG/ML IJ SOLN
0.2500 mg | INTRAMUSCULAR | Status: DC | PRN
  Administered 2023-08-14: 0.5 mg via INTRAVENOUS

## 2023-08-14 MED ORDER — SODIUM CHLORIDE (PF) 0.9 % IJ SOLN
INTRAMUSCULAR | Status: AC
  Filled 2023-08-14: qty 20

## 2023-08-14 MED ORDER — LACTATED RINGERS IV SOLN: INTRAVENOUS | Status: DC

## 2023-08-14 MED ORDER — DEXAMETHASONE SODIUM PHOSPHATE 10 MG/ML IJ SOLN
INTRAMUSCULAR | Status: DC | PRN
  Administered 2023-08-14: 10 mg via INTRAVENOUS

## 2023-08-14 MED ORDER — OXYCODONE HCL 5 MG PO TABS: 5.0000 mg | ORAL_TABLET | Freq: Once | ORAL | Status: DC | PRN

## 2023-08-14 MED ORDER — ONDANSETRON HCL 4 MG/2ML IJ SOLN
INTRAMUSCULAR | Status: DC | PRN
  Administered 2023-08-14: 4 mg via INTRAVENOUS

## 2023-08-14 MED ORDER — FENTANYL CITRATE (PF) 250 MCG/5ML IJ SOLN
INTRAMUSCULAR | Status: DC | PRN
  Administered 2023-08-14: 50 ug via INTRAVENOUS

## 2023-08-14 MED ORDER — MIDAZOLAM HCL 2 MG/2ML IJ SOLN
INTRAMUSCULAR | Status: AC
  Filled 2023-08-14: qty 2

## 2023-08-14 MED ORDER — AMISULPRIDE (ANTIEMETIC) 5 MG/2ML IV SOLN: 10.0000 mg | Freq: Once | INTRAVENOUS | Status: DC | PRN

## 2023-08-14 MED ORDER — HEPARIN SOD (PORK) LOCK FLUSH 100 UNIT/ML IV SOLN
INTRAVENOUS | Status: DC | PRN
  Administered 2023-08-14: 500 [IU]

## 2023-08-14 MED ORDER — LIDOCAINE HCL 1 % IJ SOLN
INTRAMUSCULAR | Status: AC
  Filled 2023-08-14: qty 20

## 2023-08-14 MED ORDER — LIDOCAINE 2% (20 MG/ML) 5 ML SYRINGE
INTRAMUSCULAR | Status: DC | PRN
  Administered 2023-08-14: 60 mg via INTRAVENOUS

## 2023-08-14 MED ORDER — CITALOPRAM HYDROBROMIDE 40 MG PO TABS: 20.0000 mg | ORAL_TABLET | Freq: Every day | ORAL | Status: DC

## 2023-08-14 MED FILL — Fosaprepitant Dimeglumine For IV Infusion 150 MG (Base Eq): INTRAVENOUS | Qty: 5 | Status: AC

## 2023-08-14 MED FILL — Dexamethasone Sodium Phosphate Inj 100 MG/10ML: INTRAMUSCULAR | Qty: 1 | Status: AC

## 2023-08-14 SURGICAL SUPPLY — 51 items
ADH SKN CLS APL DERMABOND .7 (GAUZE/BANDAGES/DRESSINGS) ×1
APL PRP STRL LF DISP 70% ISPRP (MISCELLANEOUS) ×1
BAG COUNTER SPONGE SURGICOUNT (BAG) ×2 IMPLANT
BAG DECANTER FOR FLEXI CONT (MISCELLANEOUS) ×2 IMPLANT
BAG SPNG CNTER NS LX DISP (BAG) ×1
BIOPATCH RED 1 DISK 7.0 (GAUZE/BANDAGES/DRESSINGS) IMPLANT
CANISTER SUCT 3000ML PPV (MISCELLANEOUS) IMPLANT
CHLORAPREP W/TINT 26 (MISCELLANEOUS) ×2 IMPLANT
COVER SURGICAL LIGHT HANDLE (MISCELLANEOUS) ×2 IMPLANT
COVER TRANSDUCER ULTRASND GEL (DISPOSABLE) IMPLANT
DERMABOND ADVANCED .7 DNX12 (GAUZE/BANDAGES/DRESSINGS) ×2 IMPLANT
DRAPE C-ARM 42X120 X-RAY (DRAPES) ×2 IMPLANT
DRAPE CHEST BREAST 15X10 FENES (DRAPES) ×2 IMPLANT
DRAPE WARM FLUID 44X44 (DRAPES) IMPLANT
DRSG COVADERM 4X6 (GAUZE/BANDAGES/DRESSINGS) IMPLANT
ELECT COATED BLADE 2.86 ST (ELECTRODE) ×2 IMPLANT
ELECT REM PT RETURN 9FT ADLT (ELECTROSURGICAL) ×1
ELECTRODE REM PT RTRN 9FT ADLT (ELECTROSURGICAL) ×2 IMPLANT
GAUZE 4X4 16PLY ~~LOC~~+RFID DBL (SPONGE) ×2 IMPLANT
GEL ULTRASOUND 20GR AQUASONIC (MISCELLANEOUS) IMPLANT
GLOVE BIO SURGEON STRL SZ 6 (GLOVE) ×2 IMPLANT
GLOVE INDICATOR 6.5 STRL GRN (GLOVE) ×2 IMPLANT
GOWN STRL REUS W/ TWL LRG LVL3 (GOWN DISPOSABLE) ×2 IMPLANT
GOWN STRL REUS W/ TWL XL LVL3 (GOWN DISPOSABLE) ×2 IMPLANT
GOWN STRL REUS W/TWL LRG LVL3 (GOWN DISPOSABLE) ×1
GOWN STRL REUS W/TWL XL LVL3 (GOWN DISPOSABLE) ×1
KIT BASIN OR (CUSTOM PROCEDURE TRAY) ×2 IMPLANT
KIT PORT INFUSION SMART 8FR (Port) IMPLANT
KIT PORT POWER 8FR ISP CVUE (Port) IMPLANT
KIT PORT POWER 9.6FR MRI PREA (Stent) IMPLANT
KIT POWER CATH 8FR (Port) IMPLANT
KIT TURNOVER KIT B (KITS) ×2 IMPLANT
NDL 22X1.5 STRL (OR ONLY) (MISCELLANEOUS) ×2 IMPLANT
NEEDLE 22X1.5 STRL (OR ONLY) (MISCELLANEOUS) ×1
NS IRRIG 1000ML POUR BTL (IV SOLUTION) ×2 IMPLANT
PAD ARMBOARD 7.5X6 YLW CONV (MISCELLANEOUS) ×2 IMPLANT
PENCIL BUTTON HOLSTER BLD 10FT (ELECTRODE) ×2 IMPLANT
POSITIONER HEAD DONUT 9IN (MISCELLANEOUS) ×2 IMPLANT
Port Access System
SET PORT ACCESS POWERLOC (SET/KITS/TRAYS/PACK) IMPLANT
SPIKE FLUID TRANSFER (MISCELLANEOUS) ×4 IMPLANT
SUT MON AB 4-0 PC3 18 (SUTURE) ×2 IMPLANT
SUT PROLENE 2 0 SH DA (SUTURE) ×4 IMPLANT
SUT VIC AB 3-0 SH 27 (SUTURE) ×1
SUT VIC AB 3-0 SH 27X BRD (SUTURE) ×2 IMPLANT
SYR 5ML LUER SLIP (SYRINGE) ×2 IMPLANT
TOWEL GREEN STERILE (TOWEL DISPOSABLE) ×2 IMPLANT
TOWEL GREEN STERILE FF (TOWEL DISPOSABLE) ×2 IMPLANT
TRAY LAPAROSCOPIC MC (CUSTOM PROCEDURE TRAY) ×2 IMPLANT
TUBE CONNECTING 12X1/4 (SUCTIONS) IMPLANT
YANKAUER SUCT BULB TIP NO VENT (SUCTIONS) IMPLANT

## 2023-08-14 NOTE — Progress Notes (Signed)
0.5mg  of dilaudid wasted in stericycle with Chaney Malling RN.Marland Kitchen  Versie Starks, RN

## 2023-08-14 NOTE — Anesthesia Postprocedure Evaluation (Signed)
Anesthesia Post Note  Patient: Ann Daugherty  Procedure(s) Performed: INSERTION PORT-A-CATH WITH ULTRASOUND GUIDEANCE (Right: Chest)     Patient location during evaluation: PACU Anesthesia Type: General Level of consciousness: awake and alert, oriented and patient cooperative Pain management: pain level controlled Vital Signs Assessment: post-procedure vital signs reviewed and stable Respiratory status: spontaneous breathing, nonlabored ventilation and respiratory function stable Cardiovascular status: blood pressure returned to baseline and stable Postop Assessment: no apparent nausea or vomiting Anesthetic complications: no   No notable events documented.  Last Vitals:  Vitals:   08/14/23 1600 08/14/23 1615  BP: 128/67 112/76  Pulse: (!) 52 64  Resp: 16 16  Temp:  36.7 C  SpO2: 100% 100%    Last Pain:  Vitals:   08/14/23 1615  PainSc: 0-No pain                 Tennis Must Rian Koon

## 2023-08-14 NOTE — Anesthesia Preprocedure Evaluation (Addendum)
Anesthesia Evaluation    Reviewed: Allergy & Precautions, Patient's Chart, lab work & pertinent test results  History of Anesthesia Complications (+) PONV and history of anesthetic complications  Airway Mallampati: II  TM Distance: >3 FB Neck ROM: Full    Dental no notable dental hx.    Pulmonary former smoker Quit smoking 2022, 15 pack year history    Pulmonary exam normal breath sounds clear to auscultation       Cardiovascular negative cardio ROS Normal cardiovascular exam Rhythm:Regular Rate:Normal     Neuro/Psych  PSYCHIATRIC DISORDERS Anxiety Depression    negative neurological ROS     GI/Hepatic Neg liver ROS,GERD  Controlled,,  Endo/Other  negative endocrine ROS    Renal/GU negative Renal ROS  negative genitourinary   Musculoskeletal negative musculoskeletal ROS (+)    Abdominal   Peds  Hematology negative hematology ROS (+)   Anesthesia Other Findings L breast ca  Reproductive/Obstetrics negative OB ROS                             Anesthesia Physical Anesthesia Plan  ASA: 2  Anesthesia Plan: General   Post-op Pain Management:    Induction: Intravenous  PONV Risk Score and Plan: Propofol infusion, TIVA, Midazolam, Treatment may vary due to age or medical condition and Ondansetron  Airway Management Planned: LMA  Additional Equipment: None  Intra-op Plan:   Post-operative Plan: Extubation in OR  Informed Consent: I have reviewed the patients History and Physical, chart, labs and discussed the procedure including the risks, benefits and alternatives for the proposed anesthesia with the patient or authorized representative who has indicated his/her understanding and acceptance.     Dental advisory given  Plan Discussed with: CRNA  Anesthesia Plan Comments:        Anesthesia Quick Evaluation

## 2023-08-14 NOTE — Op Note (Signed)
PREOPERATIVE DIAGNOSIS:  left breast cancer     POSTOPERATIVE DIAGNOSIS:  Same     PROCEDURE: right subclavian port placement, Ang[employer] SmartPort, 8-French.      SURGEON:  Almond Lint, MD      ANESTHESIA:  General   FINDINGS:  Good venous return, easy flush, and tip of the catheter and   SVC 19.5 cm.      SPECIMEN:  None.      ESTIMATED BLOOD LOSS:  Minimal.      COMPLICATIONS:  None known.      PROCEDURE:  Pt was identified in the holding area and taken to   the operating room, where patient was placed supine on the operating room   table.  Gnneral anesthesia was induced.  Patient's arms were tucked and the upper   chest and neck were prepped and draped in sterile fashion.  Time-out was   performed according to the surgical safety check list.  When all was   correct, we continued.     Local anesthetic was administered just under the angle of the right clavicle.  The vein was accessed with 1 pass(es) of the needle. There was good venous return and the wire passed easily with no ectopy.   Fluoroscopy was used to confirm that the wire was in the vena cava.      The patient was placed back level and the area for the pocket was anethetized   with local anesthetic.  A 3-cm transverse incision was made with a #15   blade.  Cautery was used to divide the subcutaneous tissues down to the   pectoralis muscle.  An [employer] retractor was used to elevate the skin   while a pocket was created on top of the pectoralis fascia.  The port   was placed into the pocket to confirm that it was of adequate size.  The   catheter was preattached to the port.  The port was then secured to the   pectoralis fascia with four 2-0 Prolene sutures.  These were clamped and   not tied down yet.    The catheter was tunneled through to the wire exit   site.  The catheter was placed along the wire to determine what length it should be to be in the SVC.  The catheter was cut at 19.5 cm.  The tunneler  sheath and dilator were passed over the wire and the dilator and wire were removed.  The catheter was advanced through the tunneler sheath and the tunneler sheath was pulled away.  Care was taken to keep the catheter in the tunneler sheath as this occurred. This was advanced and the tunneler sheath was removed.  There was good venous   return and easy flush of the catheter.  The Prolene sutures were tied   down to the pectoral fascia.  The skin was reapproximated using 3-0   Vicryl interrupted deep dermal sutures.    Fluoroscopy was used to re-confirm good position of the catheter.  The skin   was then closed using 4-0 Monocryl in a subcuticular fashion.    The patient's port was left accessed.  A biopatch was placed just around the needle and a the occlusive see through dressing.  4x4s and medium tegaderms were then used to pad the remaining ports/clamps on the rest of the access tubing.    The port was flushed with concentrated heparin flush as well.  The wounds were then cleaned, dried, and dressed with Dermabond.  The patient was awakened from anesthesia and taken to the PACU in stable condition.  Needle, sponge, and instrument counts were correct.               Almond Lint, MD

## 2023-08-14 NOTE — Anesthesia Procedure Notes (Signed)
Procedure Name: LMA Insertion Date/Time: 08/14/2023 2:00 PM  Performed by: Aundria Rud, CRNAPre-anesthesia Checklist: Patient identified, Patient being monitored, Timeout performed, Emergency Drugs available and Suction available Patient Re-evaluated:Patient Re-evaluated prior to induction Oxygen Delivery Method: Circle system utilized Preoxygenation: Pre-oxygenation with 100% oxygen Induction Type: IV induction Ventilation: Mask ventilation without difficulty LMA: LMA inserted LMA Size: 3.0 Tube type: Oral Number of attempts: 1 Placement Confirmation: positive ETCO2 and breath sounds checked- equal and bilateral Tube secured with: Tape Dental Injury: Teeth and Oropharynx as per pre-operative assessment

## 2023-08-14 NOTE — Discharge Instructions (Addendum)
[facility] ?Office Phone Number [phone] ? ? POST OP INSTRUCTIONS ? ?Always review your discharge instruction sheet given to you by the facility where your surgery was performed. ? ?IF YOU HAVE DISABILITY OR FAMILY LEAVE FORMS, YOU MUST BRING THEM TO THE OFFICE FOR PROCESSING.  DO NOT GIVE THEM TO YOUR DOCTOR. ? ?Take 2 tylenol (acetominophen) three times a day for 3 days.  If you still have pain, add ibuprofen with food in between if able to take this (if you have kidney issues or stomach issues, do not take ibuprofen).  If both of those are not enough, add the narcotic pain pill.  If you find you are needing a lot of this overnight after surgery, call the next morning for a refill.   ?Take your usually prescribed medications unless otherwise directed ?If you need a refill on your pain medication, please contact your pharmacy.  They will contact our office to request authorization.  Prescriptions will not be filled after 5pm or on week-ends. ?You should eat very light the first 24 hours after surgery, such as soup, crackers, pudding, etc.  Resume your normal diet the day after surgery ?It is common to experience some constipation if taking pain medication after surgery.  Increasing fluid intake and taking a stool softener will usually help or prevent this problem from occurring.  A mild laxative (Milk of Magnesia or Miralax) should be taken according to package directions if there are no bowel movements after 48 hours. ?You may shower in 48 hours.  The surgical glue will flake off in 2-3 weeks.   ?ACTIVITIES:  No strenuous activity or heavy lifting for 1 week.   ?You may drive when you no longer are taking prescription pain medication, you can comfortably wear a seatbelt, and you can safely maneuver your car and apply brakes. ?RETURN TO WORK:  __________1 week if applicable. _______________ ?You should see your doctor in the office for a follow-up appointment approximately three-four weeks  after your surgery.   ? ?WHEN TO CALL YOUR DOCTOR: ?Fever over 101.0 ?Nausea and/or vomiting. ?Extreme swelling or bruising. ?Continued bleeding from incision. ?Increased pain, redness, or drainage from the incision. ? ?The clinic staff is available to answer your questions during regular business hours.  Please don?t hesitate to call and ask to speak to one of the nurses for clinical concerns.  If you have a medical emergency, go to the nearest emergency room or call 911.  A surgeon from [facility] is always on call at the hospital. ? ?For further questions, please visit [email or web address]  ? ?

## 2023-08-14 NOTE — Interval H&P Note (Signed)
History and Physical Interval Note:  08/14/2023 1:12 PM  Ann Daugherty  has presented today for surgery, with the diagnosis of LEFT BREAST CANCER.  The various methods of treatment have been discussed with the patient and family. After consideration of risks, benefits and other options for treatment, the patient has consented to  Procedure(s): INSERTION PORT-A-CATH WITH ULTRASOUND GUIDEANCE (N/A) as a surgical intervention.  The patient's history has been reviewed, patient examined, no change in status, stable for surgery.  I have reviewed the patient's chart and labs.  Questions were answered to the patient's satisfaction.     Almond Lint

## 2023-08-14 NOTE — Transfer of Care (Signed)
Immediate Anesthesia Transfer of Care Note  Patient: SARIT SPARANO  Procedure(s) Performed: INSERTION PORT-A-CATH WITH ULTRASOUND GUIDEANCE (Right: Chest)  Patient Location: PACU  Anesthesia Type:General  Level of Consciousness: awake, alert , and patient cooperative  Airway & Oxygen Therapy: Patient Spontanous Breathing  Post-op Assessment: Report given to RN, Post -op Vital signs reviewed and stable, and Patient moving all extremities X 4  Post vital signs: Reviewed and stable  Last Vitals:  Vitals Value Taken Time  BP 128/67 08/14/23 1600  Temp 36.6 C 08/14/23 1445  Pulse 59 08/14/23 1603  Resp 12 08/14/23 1603  SpO2 100 % 08/14/23 1603  Vitals shown include unfiled device data.  Last Pain:  Vitals:   08/14/23 1545  PainSc: 0-No pain      Patients Stated Pain Goal: 0 (08/14/23 1127)  Complications: No notable events documented.

## 2023-08-15 ENCOUNTER — Inpatient Hospital Stay (HOSPITAL_BASED_OUTPATIENT_CLINIC_OR_DEPARTMENT_OTHER): Payer: Medicaid Other | Admitting: Hematology and Oncology

## 2023-08-15 ENCOUNTER — Inpatient Hospital Stay: Payer: Medicaid Other

## 2023-08-15 VITALS — BP 126/60 | HR 70 | Temp 97.5°F | Resp 18 | Ht 66.0 in | Wt 151.6 lb

## 2023-08-15 DIAGNOSIS — C50212 Malignant neoplasm of upper-inner quadrant of left female breast: Secondary | ICD-10-CM | POA: Diagnosis not present

## 2023-08-15 DIAGNOSIS — Z5111 Encounter for antineoplastic chemotherapy: Secondary | ICD-10-CM | POA: Diagnosis not present

## 2023-08-15 DIAGNOSIS — Z95828 Presence of other vascular implants and grafts: Secondary | ICD-10-CM | POA: Insufficient documentation

## 2023-08-15 LAB — CBC WITH DIFFERENTIAL (CANCER CENTER ONLY)
Abs Immature Granulocytes: 0.07 10*3/uL (ref 0.00–0.07)
Basophils Absolute: 0 10*3/uL (ref 0.0–0.1)
Basophils Relative: 0 %
Eosinophils Absolute: 0 10*3/uL (ref 0.0–0.5)
Eosinophils Relative: 0 %
HCT: 33.4 % — ABNORMAL LOW (ref 36.0–46.0)
Hemoglobin: 11.3 g/dL — ABNORMAL LOW (ref 12.0–15.0)
Immature Granulocytes: 1 %
Lymphocytes Relative: 19 %
Lymphs Abs: 2 10*3/uL (ref 0.7–4.0)
MCH: 30.1 pg (ref 26.0–34.0)
MCHC: 33.8 g/dL (ref 30.0–36.0)
MCV: 88.8 fL (ref 80.0–100.0)
Monocytes Absolute: 0.8 10*3/uL (ref 0.1–1.0)
Monocytes Relative: 7 %
Neutro Abs: 7.7 10*3/uL (ref 1.7–7.7)
Neutrophils Relative %: 73 %
Platelet Count: 306 10*3/uL (ref 150–400)
RBC: 3.76 MIL/uL — ABNORMAL LOW (ref 3.87–5.11)
RDW: 12.3 % (ref 11.5–15.5)
WBC Count: 10.6 10*3/uL — ABNORMAL HIGH (ref 4.0–10.5)
nRBC: 0 % (ref 0.0–0.2)

## 2023-08-15 LAB — CMP (CANCER CENTER ONLY)
ALT: 14 U/L (ref 0–44)
AST: 15 U/L (ref 15–41)
Albumin: 4.1 g/dL (ref 3.5–5.0)
Alkaline Phosphatase: 62 U/L (ref 38–126)
Anion gap: 6 (ref 5–15)
BUN: 8 mg/dL (ref 6–20)
CO2: 26 mmol/L (ref 22–32)
Calcium: 9.4 mg/dL (ref 8.9–10.3)
Chloride: 105 mmol/L (ref 98–111)
Creatinine: 0.66 mg/dL (ref 0.44–1.00)
GFR, Estimated: >60 mL/min (ref >60)
Glucose, Bld: 123 mg/dL — ABNORMAL HIGH (ref 70–99)
Potassium: 3.1 mmol/L — ABNORMAL LOW (ref 3.5–5.1)
Sodium: 137 mmol/L (ref 135–145)
Total Bilirubin: 0.3 mg/dL (ref 0.3–1.2)
Total Protein: 6.9 g/dL (ref 6.5–8.1)

## 2023-08-15 LAB — PREGNANCY, URINE: Preg Test, Ur: NEGATIVE

## 2023-08-15 MED ORDER — SODIUM CHLORIDE 0.9 % IV SOLN
150.0000 mg | Freq: Once | INTRAVENOUS | Status: AC
  Administered 2023-08-15: 150 mg via INTRAVENOUS
  Filled 2023-08-15: qty 150

## 2023-08-15 MED ORDER — SODIUM CHLORIDE 0.9 % IV SOLN: Freq: Once | INTRAVENOUS | Status: AC

## 2023-08-15 MED ORDER — SODIUM CHLORIDE 0.9 % IV SOLN
10.0000 mg | Freq: Once | INTRAVENOUS | Status: AC
  Administered 2023-08-15: 10 mg via INTRAVENOUS
  Filled 2023-08-15: qty 10

## 2023-08-15 MED ORDER — HEPARIN SOD (PORK) LOCK FLUSH 100 UNIT/ML IV SOLN
500.0000 [IU] | Freq: Once | INTRAVENOUS | Status: AC | PRN
  Administered 2023-08-15: 500 [IU]

## 2023-08-15 MED ORDER — SODIUM CHLORIDE 0.9 % IV SOLN
600.0000 mg/m2 | Freq: Once | INTRAVENOUS | Status: AC
  Administered 2023-08-15: 1000 mg via INTRAVENOUS
  Filled 2023-08-15: qty 50

## 2023-08-15 MED ORDER — SODIUM CHLORIDE 0.9% FLUSH
10.0000 mL | INTRAVENOUS | Status: DC | PRN
  Administered 2023-08-15: 10 mL

## 2023-08-15 MED ORDER — PALONOSETRON HCL INJECTION 0.25 MG/5ML
0.2500 mg | Freq: Once | INTRAVENOUS | Status: AC
  Administered 2023-08-15: 0.25 mg via INTRAVENOUS
  Filled 2023-08-15: qty 5

## 2023-08-15 MED ORDER — SODIUM CHLORIDE 0.9% FLUSH
10.0000 mL | Freq: Once | INTRAVENOUS | Status: AC
  Administered 2023-08-15: 10 mL

## 2023-08-15 MED ORDER — DOXORUBICIN HCL CHEMO IV INJECTION 2 MG/ML
60.0000 mg/m2 | Freq: Once | INTRAVENOUS | Status: AC
  Administered 2023-08-15: 108 mg via INTRAVENOUS
  Filled 2023-08-15: qty 54

## 2023-08-15 NOTE — Assessment & Plan Note (Addendum)
07/19/2023:Palpable left breast mass for 6 months.  Mammogram and ultrasound revealed large masses.   10:30 position: 4.3 cm with skin thickening and ulceration: Grade 2 IDC with high-grade DCIS ER 95%, PR 95%, Ki67 30%, HER2 2+ by IHC, FISH negative ratio 1.37;  5 o'clock position: 3.3 cm with calcifications spanning 6.7 cm, axilla negative, biopsy: Grade 3 IDC with necrosis ER 95% PR 90% Ki-67 60% HER2 1+ negative   CT CAP 08/03/2023: 2 prominent left breast masses, left axillary lymph nodes, right hepatic lobe lesion concerning for metastatic breast cancer Ultrasound a liver biopsy scheduled for 08/17/2023  Treatment plan: Neoadjuvant chemotherapy with dose Adriamycin and Cytoxan followed by Taxol Mastectomy left breast Adjuvant radiation Antiestrogen therapy with CDK inhibitors -------------------------------------------------------------------------------------------------------------------------------------- Current treatment: Cycle 1 dose dense Adriamycin and Cytoxan  Liver biopsy is scheduled for 08/17/2023. The reason for using definitive chemotherapy is because we believe she has solitary metastasis.  Return to clinic in 1 week to see Mardella Layman for a toxicity check

## 2023-08-15 NOTE — Patient Instructions (Signed)
[facility]  Discharge Instructions: Thank you for choosing [facility] to provide your oncology and hematology care.   If you have a lab appointment with the Cancer Center, please go directly to the Cancer Center and check in at the registration area.   Wear comfortable clothing and clothing appropriate for easy access to any Portacath or PICC line.   We strive to give you quality time with your provider. You may need to reschedule your appointment if you arrive late (15 or more minutes).  Arriving late affects you and other patients whose appointments are after yours.  Also, if you miss three or more appointments without notifying the office, you may be dismissed from the clinic at the provider's discretion.      For prescription refill requests, have your pharmacy contact our office and allow 72 hours for refills to be completed.    Today you received the following chemotherapy and/or immunotherapy agents: Adriamycin/Cytoxan      To help prevent nausea and vomiting after your treatment, we encourage you to take your nausea medication as directed.  BELOW ARE SYMPTOMS THAT SHOULD BE REPORTED IMMEDIATELY: *FEVER GREATER THAN 100.4 F (38 C) OR HIGHER *CHILLS OR SWEATING *NAUSEA AND VOMITING THAT IS NOT CONTROLLED WITH YOUR NAUSEA MEDICATION *UNUSUAL SHORTNESS OF BREATH *UNUSUAL BRUISING OR BLEEDING *URINARY PROBLEMS (pain or burning when urinating, or frequent urination) *BOWEL PROBLEMS (unusual diarrhea, constipation, pain near the anus) TENDERNESS IN MOUTH AND THROAT WITH OR WITHOUT PRESENCE OF ULCERS (sore throat, sores in mouth, or a toothache) UNUSUAL RASH, SWELLING OR PAIN  UNUSUAL VAGINAL DISCHARGE OR ITCHING   Items with * indicate a potential emergency and should be followed up as soon as possible or go to the Emergency Department if any problems should occur.  Please show the CHEMOTHERAPY ALERT CARD or IMMUNOTHERAPY ALERT CARD  at check-in to the Emergency Department and triage nurse.  Should you have questions after your visit or need to cancel or reschedule your appointment, please contact [facility]  Dept: [phone]  and follow the prompts.  Office hours are 8:00 a.m. to 4:30 p.m. Monday - Friday. Please note that voicemails left after 4:00 p.m. may not be returned until the following business day.  We are closed weekends and major holidays. You have access to a nurse at all times for urgent questions. Please call the main number to the clinic Dept: [phone] and follow the prompts.   For any non-urgent questions, you may also contact your provider using MyChart. We now offer e-Visits for anyone 18 and older to request care online for non-urgent symptoms. For details visit [email or web address].   Also download the MyChart app! Go to the app store, search "MyChart", open the app, select [facility], and log in with your MyChart username and password.   

## 2023-08-15 NOTE — Progress Notes (Signed)
Patient Care Team: [name], [name] as PCP - General (Physician Assistant) Obgyn, Ma Hillock as Consulting Physician (Obstetrics and Gynecology) Donalee Citrin, MD as Consulting Physician (Neurosurgery) Donnelly Angelica, RN as Oncology Nurse Navigator Pershing Proud, RN as Oncology Nurse Navigator Serena Croissant, MD as Consulting Physician (Hematology and Oncology) Almond Lint, MD as Consulting Physician (General Surgery) Lonie Peak, MD as Attending Physician (Radiation Oncology)  DIAGNOSIS:  Encounter Diagnosis  Name Primary?   Malignant neoplasm of upper-inner quadrant of left female breast, unspecified estrogen receptor status (HCC) Yes    SUMMARY OF ONCOLOGIC HISTORY: Oncology History  Malignant neoplasm of upper-inner quadrant of left female breast (HCC)  07/19/2023 Initial Diagnosis   Palpable left breast mass for 6 months.  Mammogram and ultrasound revealed large masses.  10:30 position: 4.3 cm with skin thickening and ulceration: Grade 2 IDC with high-grade DCIS ER 95%, PR 95%, Ki67 30%, HER2 2+ by IHC, FISH negative ratio 1.37; 5 o'clock position: 3.3 cm with calcifications spanning 6.7 cm, axilla negative, biopsy: Grade 3 IDC with necrosis ER 95% PR 90% Ki-67 60% HER2 1+ negative   07/26/2023 Cancer Staging   Staging form: Breast, AJCC 8th Edition - Clinical: Stage IIIB (cT4b, cN0, cM0, G3, ER+, PR+, HER2-) - Signed by Serena Croissant, MD on 07/26/2023 Histologic grading system: 3 grade system   08/15/2023 -  Chemotherapy   Patient is on Treatment Plan : BREAST ADJUVANT DOSE DENSE AC q14d / PACLitaxel q7d       CHIEF COMPLIANT: Cycle 1 Adriamycin and Cytoxan  History of Present Illness   The patient, with a recent diagnosis of breast cancer,is here for her first cycle of adriamycin and Cytoxan. She has attended a chemotherapy class and received all necessary information. In preparation for hair loss due to chemotherapy, she has donated her hair to [employer] and  has collected surgical hats for when her hair starts to fall out.  She expresses readiness to start treatment and feels empowered by her proactive approach to managing her hair loss.  In addition, the patient will be undergoing a liver biopsy.        ALLERGIES:  has No Known Allergies.  MEDICATIONS:  Current Outpatient Medications  Medication Sig Dispense Refill   acetaminophen (TYLENOL) 500 MG tablet Take 500 mg by mouth every 6 (six) hours as needed for mild pain. (Patient not taking: Reported on 08/13/2023)     Cholecalciferol (VITAMIN D3) 5000 units CAPS Take 5,000 Units by mouth daily.     citalopram (CELEXA) 40 MG tablet Take 0.5 tablets (20 mg total) by mouth daily.     dexamethasone (DECADRON) 4 MG tablet Take 1 tablet by mouth the day after and 1 tablet 2 days after chemo (Patient not taking: Reported on 08/13/2023) 8 tablet 0   Ibuprofen 200 MG CAPS Take 400 mg by mouth daily as needed (Pain). (Patient not taking: Reported on 08/13/2023)     lidocaine-prilocaine (EMLA) cream Apply to affected area once (Patient not taking: Reported on 08/13/2023) 30 g 3   LORazepam (ATIVAN) 0.5 MG tablet Take 1 tablet (0.5 mg total) by mouth 2 (two) times daily as needed for anxiety. 30 tablet 1   ondansetron (ZOFRAN) 8 MG tablet Take 1 tablet (8 mg total) by mouth every 8 (eight) hours as needed for nausea/vomiting. Start third day after doxorubicin/cyclophosphamide chemotherapy. (Patient not taking: Reported on 08/13/2023) 30 tablet 1   oxyCODONE (OXY IR/ROXICODONE) 5 MG immediate release tablet Take 1 tablet (  5 mg total) by mouth every 6 (six) hours as needed for severe pain. 8 tablet 0   prochlorperazine (COMPAZINE) 10 MG tablet Take 1 tablet (10 mg total) by mouth every 6 (six) hours as needed for nausea or vomiting. (Patient not taking: Reported on 08/13/2023) 30 tablet 1   No current facility-administered medications for this visit.    PHYSICAL EXAMINATION: ECOG PERFORMANCE STATUS: 1 -  Symptomatic but completely ambulatory  Vitals:   08/15/23 1206  BP: 126/60  Pulse: 70  Resp: 18  Temp: (!) 97.5 F (36.4 C)  SpO2: 99%   Filed Weights   08/15/23 1206  Weight: 151 lb 9.6 oz (68.8 kg)      LABORATORY DATA:  I have reviewed the data as listed    Latest Ref Rng & Units 08/15/2023   11:11 AM 07/26/2023    2:06 PM 08/09/2020   10:32 AM  CMP  Glucose 70 - 99 mg/dL 161  096  95   BUN 6 - 20 mg/dL 8  14  8    Creatinine 0.44 - 1.00 mg/dL 0.45  4.09  8.11   Sodium 135 - 145 mmol/L 137  137  137   Potassium 3.5 - 5.1 mmol/L 3.1  3.8  4.1   Chloride 98 - 111 mmol/L 105  104  107   CO2 22 - 32 mmol/L 26  25  21    Calcium 8.9 - 10.3 mg/dL 9.4  9.4  9.0   Total Protein 6.5 - 8.1 g/dL 6.9  7.1  6.6   Total Bilirubin 0.3 - 1.2 mg/dL 0.3  0.3  0.3   Alkaline Phos 38 - 126 U/L 62  73    AST 15 - 41 U/L 15  15  18    ALT 0 - 44 U/L 14  12  11      Lab Results  Component Value Date   WBC 10.6 (H) 08/15/2023   HGB 11.3 (L) 08/15/2023   HCT 33.4 (L) 08/15/2023   MCV 88.8 08/15/2023   PLT 306 08/15/2023   NEUTROABS 7.7 08/15/2023    ASSESSMENT & PLAN:  Malignant neoplasm of upper-inner quadrant of left female breast (HCC) 07/19/2023:Palpable left breast mass for 6 months.  Mammogram and ultrasound revealed large masses.   10:30 position: 4.3 cm with skin thickening and ulceration: Grade 2 IDC with high-grade DCIS ER 95%, PR 95%, Ki67 30%, HER2 2+ by IHC, FISH negative ratio 1.37;  5 o'clock position: 3.3 cm with calcifications spanning 6.7 cm, axilla negative, biopsy: Grade 3 IDC with necrosis ER 95% PR 90% Ki-67 60% HER2 1+ negative   CT CAP 08/03/2023: 2 prominent left breast masses, left axillary lymph nodes, right hepatic lobe lesion concerning for metastatic breast cancer Ultrasound a liver biopsy scheduled for 08/17/2023  Treatment plan: Neoadjuvant chemotherapy with dose Adriamycin and Cytoxan followed by Taxol Mastectomy left breast Adjuvant  radiation Antiestrogen therapy with CDK inhibitors -------------------------------------------------------------------------------------------------------------------------------------- Current treatment: Cycle 1 dose dense Adriamycin and Cytoxan  Liver biopsy is scheduled for 08/17/2023. The reason for using definitive chemotherapy is because we believe she has solitary metastasis.   Anemia Hemoglobin slightly below normal, likely due to stress and inflammation. -Increase intake of iron-rich foods like spinach.  Hypokalemia Potassium levels are low, but no symptoms of diarrhea. -Increase intake of potassium-rich foods like bananas. -Recheck potassium levels at next visit.   Follow-up Need to monitor blood counts after chemotherapy. -Schedule appointment with nurse practitioner Mardella Layman in one week to check blood counts. -Use  arm for blood draw at next visit.     Return to clinic in 1 week to see Mardella Layman for a toxicity check     No orders of the defined types were placed in this encounter.  The patient has a good understanding of the overall plan. she agrees with it. she will call with any problems that may develop before the next visit here. Total time spent: 30 mins including face to face time and time spent for planning, charting and co-ordination of care   Tamsen Meek, MD 08/15/23

## 2023-08-16 ENCOUNTER — Encounter (HOSPITAL_COMMUNITY): Payer: Self-pay | Admitting: General Surgery

## 2023-08-16 ENCOUNTER — Other Ambulatory Visit: Payer: Self-pay | Admitting: Radiology

## 2023-08-16 ENCOUNTER — Telehealth: Payer: Self-pay

## 2023-08-16 DIAGNOSIS — K769 Liver disease, unspecified: Secondary | ICD-10-CM

## 2023-08-16 NOTE — Telephone Encounter (Signed)
-----   Message from Nurse Currie Paris sent at 08/15/2023  3:40 PM EDT ----- Regarding: first time Doxorubicin/cytoxan. pt of [facility]ud[facility]. Firs time doxorubicin/cytoxan. Pt of gudean. Tolerated well. Please check in with pt please. Thanks!

## 2023-08-16 NOTE — Telephone Encounter (Signed)
Ann Daugherty states that she is doing fine. She is eating, drinking, and urinating well. She knows to call the office at [phone] if  she has any questions or concerns.

## 2023-08-17 ENCOUNTER — Encounter: Payer: Self-pay | Admitting: *Deleted

## 2023-08-17 ENCOUNTER — Encounter (HOSPITAL_COMMUNITY): Payer: Self-pay

## 2023-08-17 ENCOUNTER — Ambulatory Visit (HOSPITAL_COMMUNITY)
Admission: RE | Admit: 2023-08-17 | Discharge: 2023-08-17 | Disposition: A | Discharge status: Home / Self Care | Payer: Medicaid Other | Source: Ambulatory Visit | Attending: Hematology and Oncology | Admitting: Hematology and Oncology

## 2023-08-17 ENCOUNTER — Other Ambulatory Visit: Payer: Self-pay

## 2023-08-17 ENCOUNTER — Ambulatory Visit: Payer: Medicaid Other

## 2023-08-17 ENCOUNTER — Inpatient Hospital Stay: Payer: Medicaid Other

## 2023-08-17 VITALS — BP 121/55 | HR 62 | Temp 98.3°F | Resp 17

## 2023-08-17 DIAGNOSIS — C50212 Malignant neoplasm of upper-inner quadrant of left female breast: Secondary | ICD-10-CM

## 2023-08-17 DIAGNOSIS — C787 Secondary malignant neoplasm of liver and intrahepatic bile duct: Secondary | ICD-10-CM | POA: Insufficient documentation

## 2023-08-17 DIAGNOSIS — K769 Liver disease, unspecified: Secondary | ICD-10-CM

## 2023-08-17 DIAGNOSIS — Z853 Personal history of malignant neoplasm of breast: Secondary | ICD-10-CM | POA: Diagnosis not present

## 2023-08-17 DIAGNOSIS — R160 Hepatomegaly, not elsewhere classified: Secondary | ICD-10-CM | POA: Diagnosis not present

## 2023-08-17 DIAGNOSIS — K219 Gastro-esophageal reflux disease without esophagitis: Secondary | ICD-10-CM | POA: Insufficient documentation

## 2023-08-17 DIAGNOSIS — Z5111 Encounter for antineoplastic chemotherapy: Secondary | ICD-10-CM | POA: Diagnosis not present

## 2023-08-17 LAB — CBC
HCT: 36.5 % (ref 36.0–46.0)
Hemoglobin: 11.5 g/dL — ABNORMAL LOW (ref 12.0–15.0)
MCH: 28.8 pg (ref 26.0–34.0)
MCHC: 31.5 g/dL (ref 30.0–36.0)
MCV: 91.3 fL (ref 80.0–100.0)
Platelets: 313 10*3/uL (ref 150–400)
RBC: 4 MIL/uL (ref 3.87–5.11)
RDW: 12.6 % (ref 11.5–15.5)
WBC: 7.2 10*3/uL (ref 4.0–10.5)
nRBC: 0 % (ref 0.0–0.2)

## 2023-08-17 LAB — PROTIME-INR
INR: 1 (ref 0.8–1.2)
Prothrombin Time: 13.2 s (ref 11.4–15.2)

## 2023-08-17 LAB — PREGNANCY, URINE: Preg Test, Ur: NEGATIVE

## 2023-08-17 MED ORDER — MIDAZOLAM HCL 2 MG/2ML IJ SOLN
INTRAMUSCULAR | Status: AC
  Filled 2023-08-17: qty 4

## 2023-08-17 MED ORDER — SODIUM CHLORIDE 0.9 % IV SOLN: INTRAVENOUS | Status: DC

## 2023-08-17 MED ORDER — FENTANYL CITRATE (PF) 100 MCG/2ML IJ SOLN
INTRAMUSCULAR | Status: AC
  Filled 2023-08-17: qty 4

## 2023-08-17 MED ORDER — PEGFILGRASTIM-CBQV 6 MG/0.6ML ~~LOC~~ SOSY
6.0000 mg | PREFILLED_SYRINGE | Freq: Once | SUBCUTANEOUS | Status: AC
  Administered 2023-08-17: 6 mg via SUBCUTANEOUS
  Filled 2023-08-17: qty 0.6

## 2023-08-17 MED ORDER — FENTANYL CITRATE (PF) 100 MCG/2ML IJ SOLN
INTRAMUSCULAR | Status: AC | PRN
Start: 2023-08-17 — End: 2023-08-17
  Administered 2023-08-17: 50 ug via INTRAVENOUS
  Administered 2023-08-17: 25 ug via INTRAVENOUS

## 2023-08-17 MED ORDER — MIDAZOLAM HCL 2 MG/2ML IJ SOLN
INTRAMUSCULAR | Status: AC | PRN
Start: 2023-08-17 — End: 2023-08-17
  Administered 2023-08-17: 1 mg via INTRAVENOUS
  Administered 2023-08-17: .5 mg via INTRAVENOUS

## 2023-08-17 NOTE — H&P (Signed)
Chief Complaint: Liver liver mass. Request is for liver mass biopsy  Referring Physician(s): Gudena,Vinay  Supervising Physician: Marliss Coots  Patient Status: [facility] - Out-pt  History of Present Illness: Ann Daugherty is a 52 y.o. female outpatient. History of GERD, breast cancer. Found to have a liver mass. Request is for liver mass biopsy for further evaluation of metastases. Case approved by IR Attending Dr. Edwyna Ready  Currently without any significant complaints. Patient alert and laying in bed,calm. Denies any fevers, headache, chest pain, SOB, cough, abdominal pain, nausea, vomiting or bleeding.    Past Medical History:  Diagnosis Date   ADD (attention deficit disorder) 2015   Does not take any medication   Anxiety    Cancer (HCC)    breast   Depression    GERD (gastroesophageal reflux disease)    History of chicken pox    PONV (postoperative nausea and vomiting)     Past Surgical History:  Procedure Laterality Date   BACK SURGERY     BREAST BIOPSY Left 07/19/2023   [place] LT BREAST BX W LOC DEV EA ADD LESION IMG BX SPEC US GUIDE 07/19/2023 [facility]   BREAST BIOPSY Left 07/19/2023   [place] LT BREAST BX W LOC DEV 1ST LESION IMG BX SPEC US GUIDE 07/19/2023 [facility]   BREAST BIOPSY Right 08/09/2023   [place] RT BREAST BX W LOC DEV 1ST LESION IMG BX SPEC US GUIDE 08/09/2023 [facility]   BREAST BIOPSY Right 08/09/2023   [place] RT BREAST BX W LOC DEV EA ADD LESION IMG BX SPEC US GUIDE 08/09/2023 [facility]   LAMINECTOMY     2007, 2011   PORTACATH PLACEMENT Right 08/14/2023   Procedure: INSERTION PORT-A-CATH WITH ULTRASOUND Julian Reil;  Surgeon: Almond Lint, MD;  Location: [facility] OR;  Service: General;  Laterality: Right;   VAGINAL DELIVERY  2004, 2006    Allergies: Patient has no known allergies.  Medications: Prior to Admission medications   Medication Sig Start Date End Date Taking? Authorizing Provider  acetaminophen (TYLENOL) 500 MG tablet  Take 500 mg by mouth every 6 (six) hours as needed for mild pain. Patient not taking: Reported on 08/13/2023    [provider]  Cholecalciferol (VITAMIN D3) 5000 units CAPS Take 5,000 Units by mouth daily. 12/07/16   [provider]  citalopram (CELEXA) 40 MG tablet Take 0.5 tablets (20 mg total) by mouth daily. 08/14/23   Almond Lint, MD  dexamethasone (DECADRON) 4 MG tablet Take 1 tablet by mouth the day after and 1 tablet 2 days after chemo Patient not taking: Reported on 08/13/2023 08/10/23   Serena Croissant, MD  Ibuprofen 200 MG CAPS Take 400 mg by mouth daily as needed (Pain). Patient not taking: Reported on 08/13/2023 12/07/16   [provider]  lidocaine-prilocaine (EMLA) cream Apply to affected area once Patient not taking: Reported on 08/13/2023 08/10/23   Serena Croissant, MD  LORazepam (ATIVAN) 0.5 MG tablet Take 1 tablet (0.5 mg total) by mouth 2 (two) times daily as needed for anxiety. 08/10/23   Serena Croissant, MD  ondansetron (ZOFRAN) 8 MG tablet Take 1 tablet (8 mg total) by mouth every 8 (eight) hours as needed for nausea/vomiting. Start third day after doxorubicin/cyclophosphamide chemotherapy. Patient not taking: Reported on 08/13/2023 08/10/23   Serena Croissant, MD  oxyCODONE (OXY IR/ROXICODONE) 5 MG immediate release tablet Take 1 tablet (5 mg total) by mouth every 6 (six) hours as needed for severe pain. 08/14/23   Almond Lint,  MD  prochlorperazine (COMPAZINE) 10 MG tablet Take 1 tablet (10 mg total) by mouth every 6 (six) hours as needed for nausea or vomiting. Patient not taking: Reported on 08/13/2023 08/10/23   Serena Croissant, MD     Family History  Problem Relation Age of Onset   Breast cancer Mother 85   Colon cancer Mother 56   Asthma Mother    Hypertension Mother    Hearing loss Mother    Hypertension Father    Diabetes Father    Hyperlipidemia Father    Learning disabilities Son    ADD / ADHD Son    Ovarian cancer Maternal Grandmother     Hypertension Maternal Grandfather    Hyperlipidemia Maternal Grandfather    Heart disease Maternal Grandfather    Stroke Maternal Grandfather    Mental illness Paternal Grandmother    Hypertension Paternal Grandfather     Social History   Socioeconomic History   Marital status: Divorced    Spouse name: Not on file   Number of children: 2   Years of education: Not on file   Highest education level: Not on file  Occupational History   Not on file  Tobacco Use   Smoking status: Former    Current packs/day: 0.00    Average packs/day: 0.5 packs/day for 30.0 years (15.0 ttl pk-yrs)    Types: Cigarettes    Start date: 04/07/1991    Quit date: 04/06/2021    Years since quitting: 2.3   Smokeless tobacco: Current  Vaping Use   Vaping status: Every Day   Substances: Nicotine  Substance and Sexual Activity   Alcohol use: Yes    Comment: 3-4 drinks a year   Drug use: No   Sexual activity: Not Currently    Birth control/protection: None  Other Topics Concern   Not on file  Social History Narrative   61 and 58 y/o sons   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   Social Determinants of Health   Financial Resource Strain: Not on file  Food Insecurity: Food Insecurity Present (07/27/2023)   Hunger Vital Sign    Worried About Running Out of Food in the Last Year: Sometimes true    Ran Out of Food in the Last Year: Sometimes true  Transportation Needs: No Transportation Needs (07/27/2023)   PRAPARE - Administrator, Civil Service (Medical): No    Lack of Transportation (Non-Medical): No  Physical Activity: Not on file  Stress: Not on file  Social Connections: Not on file    Review of Systems: A 12 point ROS discussed and pertinent positives are indicated in the HPI above.  All other systems are negative.  Review of Systems  Constitutional:  Negative for fatigue and fever.  HENT:  Negative for congestion.   Respiratory:  Negative for cough and shortness of breath.    Gastrointestinal:  Negative for abdominal pain, diarrhea, nausea and vomiting.    Vital Signs: BP 122/62   Pulse 61   Temp 99.2 F (37.3 C) (Temporal)   Resp 19   Ht 5\' 6"  (1.676 m)   Wt 150 lb (68 kg)   LMP 07/27/2023 (Exact Date)   SpO2 97%   BMI 24.21 kg/m     Physical Exam Vitals and nursing note reviewed.  Constitutional:      Appearance: She is well-developed.  HENT:     Head: Normocephalic and atraumatic.  Eyes:     Conjunctiva/sclera: Conjunctivae normal.  Cardiovascular:  Rate and Rhythm: Normal rate and regular rhythm.     Heart sounds: Normal heart sounds.  Pulmonary:     Effort: Pulmonary effort is normal.     Breath sounds: Normal breath sounds.  Musculoskeletal:        General: Normal range of motion.     Cervical back: Normal range of motion.  Skin:    General: Skin is warm and dry.  Neurological:     General: No focal deficit present.     Mental Status: She is alert and oriented to person, place, and time.  Psychiatric:        Mood and Affect: Mood normal.        Behavior: Behavior normal.        Thought Content: Thought content normal.        Judgment: Judgment normal.     Imaging: DG CHEST PORT 1 VIEW  Result Date: 08/14/2023 CLINICAL DATA:  Post port placement. EXAM: PORTABLE CHEST 1 VIEW COMPARISON:  CT 08/01/2023 FINDINGS: Accessed right subclavian chest port with tip overlying the SVC. No pneumothorax. The lungs are clear. No pleural effusion. The heart is normal in size with normal mediastinal contours. Normal pulmonary vasculature. IMPRESSION: Right subclavian chest port with tip overlying the SVC. No pneumothorax. Electronically Signed   By: Narda Rutherford M.D.   On: 08/14/2023 16:31   DG C-Arm 1-60 Min-No Report  Result Date: 08/14/2023 Fluoroscopy was utilized by the requesting physician.  No radiographic interpretation.   [place] RT BREAST BX W LOC DEV 1ST LESION IMG BX SPEC US GUIDE  Addendum Date: 08/14/2023   ADDENDUM REPORT:  08/14/2023 08:23 ADDENDUM: PATHOLOGY revealed: Site 1. Breast, RIGHT, needle core biopsy, LOQ, 7 o'clock, 7cmfn, heart clip : FIBROADENOMA (5 MM). BACKGROUND BENIGN BREAST TISSUE WITH FIBROCYSTIC CHANGES INCLUDING FIBROADENOMATOID CHANGES, SCLEROSING ADENOSIS, MICROCYSTS AND APOCRINE METAPLASIA. NEGATIVE FOR ATYPIA OR MALIGNANCY. Pathology results are CONCORDANT with imaging findings, per Dr. Quincy Carnes. PATHOLOGY revealed: Site 2. Breast, RIGHT, needle core biopsy, UIQ, 1 o'clock, 3cmfn, coil clip : BENIGN BREAST TISSUE WITH FIBROCYSTIC CHANGES INCLUDING FIBROADENOMATOID CHANGES AND SCLEROSING ADENOSIS. NEGATIVE FOR ATYPIA OR MALIGNANCY. Pathology results are CONCORDANT with imaging findings, per Dr. Quincy Carnes. Pathology results and recommendations below were discussed with patient by telephone on 08/10/2023. Patient reported biopsy site within normal limits with slight tenderness at the site. Post biopsy care instructions were reviewed, questions were answered and my direct phone number was provided to patient. Patient was instructed to call [name] if any concerns or questions arise related to the biopsy. Recommend patient continue with current treatment plan for recently diagnosed multicentric LEFT breast cancer. Pathology results reported by Donell Sievert, RN on 08/10/2023. Electronically Signed   By: Hulan Saas M.D.   On: 08/14/2023 08:23   Result Date: 08/14/2023 CLINICAL DATA:  52 year old with recent diagnosis of multifocal LEFT breast cancer. Pretreatment MRI demonstrated 2 enhancing masses in the RIGHT breast. The patient returns for second-look ultrasound and biopsy of these masses. EXAM: ULTRASOUND GUIDED RIGHT BREAST CORE NEEDLE BIOPSY x 2 COMPARISON:  Previous exam(s). PROCEDURE: I met with the patient and we discussed the procedure of ultrasound-guided biopsy, including benefits and alternatives. We discussed the high likelihood of a successful procedure. We  discussed the risks of the procedure, including infection, bleeding, tissue injury, clip migration, and inadequate sampling. Informed written consent was given. The usual time-out protocol was performed immediately prior to the procedure. Initial ultrasound evaluation confirmed a hypoechoic mass with  somewhat irregular margins at 7 o'clock 7 cm from nipple measuring approximately 0.8 x 0.6 x 0.8 cm, demonstrating no posterior characteristics and demonstrating internal power Doppler flow, corresponding to the mass on MRI in the LOWER OUTER QUADRANT. At 1 o'clock 3 cm from the nipple at anterior depth is an oval parallel heterogeneous though predominantly hypoechoic mass measuring approximately 0.7 x 0.4 x 0.5 cm, demonstrating no posterior characteristics, corresponding to the mass on MRI in the UPPER INNER QUADRANT at anterior depth. Sonographic evaluation of the RIGHT axilla demonstrates no pathologic lymphadenopathy. #1) Lesion quadrant: LOWER OUTER QUADRANT, 7 o'clock location. Using sterile technique with chlorhexidine as skin antisepsis, 1% Lidocaine as local anesthetic, under direct ultrasound visualization, a 12 gauge Bard Marquee core needle device placed through an 11 gauge introducer needle was used to perform biopsy of the mass in the lower breast using a lateral approach. At the conclusion of the procedure, a heart shaped tissue marker clip was deployed into the biopsy cavity. #2) Lesion quadrant: UPPER INNER QUADRANT, 1 o'clock location. Using sterile technique with chlorhexidine as skin antisepsis, 1% lidocaine as local anesthetic, under direct ultrasound visualization, a 12 gauge Bard Marquee core needle device placed through an 11 gauge introducer needle was used perform biopsy of the mass in the UPPER INNER QUADRANT using a lateral approach. At the conclusion of the procedure, a coil shaped tissue marker clip was deployed into the biopsy cavity. The patient tolerated the procedures well without  apparent immediate complications. Follow up 2 view mammogram was performed confirm clip placement and was dictated separately. IMPRESSION: 1. Ultrasound guided core needle biopsy of a 0.8 cm mass in the LOWER OUTER QUADRANT of the RIGHT breast at 7 o'clock, the sonographic correlate for the mass identified on MRI. 2. Ultrasound-guided core needle biopsy of a 0.7 cm mass in the UPPER INNER QUADRANT of the RIGHT breast at 1 o'clock, the sonographic correlate for the mass identified on MRI. 3. No pathologic RIGHT axillary lymphadenopathy. Electronically Signed: By: Hulan Saas M.D. On: 08/09/2023 09:51   [place] RT BREAST BX W LOC DEV EA ADD LESION IMG BX SPEC US GUIDE  Addendum Date: 08/14/2023   ADDENDUM REPORT: 08/14/2023 08:23 ADDENDUM: PATHOLOGY revealed: Site 1. Breast, RIGHT, needle core biopsy, LOQ, 7 o'clock, 7cmfn, heart clip : FIBROADENOMA (5 MM). BACKGROUND BENIGN BREAST TISSUE WITH FIBROCYSTIC CHANGES INCLUDING FIBROADENOMATOID CHANGES, SCLEROSING ADENOSIS, MICROCYSTS AND APOCRINE METAPLASIA. NEGATIVE FOR ATYPIA OR MALIGNANCY. Pathology results are CONCORDANT with imaging findings, per Dr. Quincy Carnes. PATHOLOGY revealed: Site 2. Breast, RIGHT, needle core biopsy, UIQ, 1 o'clock, 3cmfn, coil clip : BENIGN BREAST TISSUE WITH FIBROCYSTIC CHANGES INCLUDING FIBROADENOMATOID CHANGES AND SCLEROSING ADENOSIS. NEGATIVE FOR ATYPIA OR MALIGNANCY. Pathology results are CONCORDANT with imaging findings, per Dr. Quincy Carnes. Pathology results and recommendations below were discussed with patient by telephone on 08/10/2023. Patient reported biopsy site within normal limits with slight tenderness at the site. Post biopsy care instructions were reviewed, questions were answered and my direct phone number was provided to patient. Patient was instructed to call [facility] if any concerns or questions arise related to the biopsy. Recommend patient continue with current treatment plan for  recently diagnosed multicentric LEFT breast cancer. Pathology results reported by Donell Sievert, RN on 08/10/2023. Electronically Signed   By: Hulan Saas M.D.   On: 08/14/2023 08:23   Result Date: 08/14/2023 CLINICAL DATA:  52 year old with recent diagnosis of multifocal LEFT breast cancer. Pretreatment MRI demonstrated 2 enhancing masses in the  RIGHT breast. The patient returns for second-look ultrasound and biopsy of these masses. EXAM: ULTRASOUND GUIDED RIGHT BREAST CORE NEEDLE BIOPSY x 2 COMPARISON:  Previous exam(s). PROCEDURE: I met with the patient and we discussed the procedure of ultrasound-guided biopsy, including benefits and alternatives. We discussed the high likelihood of a successful procedure. We discussed the risks of the procedure, including infection, bleeding, tissue injury, clip migration, and inadequate sampling. Informed written consent was given. The usual time-out protocol was performed immediately prior to the procedure. Initial ultrasound evaluation confirmed a hypoechoic mass with somewhat irregular margins at 7 o'clock 7 cm from nipple measuring approximately 0.8 x 0.6 x 0.8 cm, demonstrating no posterior characteristics and demonstrating internal power Doppler flow, corresponding to the mass on MRI in the LOWER OUTER QUADRANT. At 1 o'clock 3 cm from the nipple at anterior depth is an oval parallel heterogeneous though predominantly hypoechoic mass measuring approximately 0.7 x 0.4 x 0.5 cm, demonstrating no posterior characteristics, corresponding to the mass on MRI in the UPPER INNER QUADRANT at anterior depth. Sonographic evaluation of the RIGHT axilla demonstrates no pathologic lymphadenopathy. #1) Lesion quadrant: LOWER OUTER QUADRANT, 7 o'clock location. Using sterile technique with chlorhexidine as skin antisepsis, 1% Lidocaine as local anesthetic, under direct ultrasound visualization, a 12 gauge Bard Marquee core needle device placed through an 11 gauge introducer needle  was used to perform biopsy of the mass in the lower breast using a lateral approach. At the conclusion of the procedure, a heart shaped tissue marker clip was deployed into the biopsy cavity. #2) Lesion quadrant: UPPER INNER QUADRANT, 1 o'clock location. Using sterile technique with chlorhexidine as skin antisepsis, 1% lidocaine as local anesthetic, under direct ultrasound visualization, a 12 gauge Bard Marquee core needle device placed through an 11 gauge introducer needle was used perform biopsy of the mass in the UPPER INNER QUADRANT using a lateral approach. At the conclusion of the procedure, a coil shaped tissue marker clip was deployed into the biopsy cavity. The patient tolerated the procedures well without apparent immediate complications. Follow up 2 view mammogram was performed confirm clip placement and was dictated separately. IMPRESSION: 1. Ultrasound guided core needle biopsy of a 0.8 cm mass in the LOWER OUTER QUADRANT of the RIGHT breast at 7 o'clock, the sonographic correlate for the mass identified on MRI. 2. Ultrasound-guided core needle biopsy of a 0.7 cm mass in the UPPER INNER QUADRANT of the RIGHT breast at 1 o'clock, the sonographic correlate for the mass identified on MRI. 3. No pathologic RIGHT axillary lymphadenopathy. Electronically Signed: By: Hulan Saas M.D. On: 08/09/2023 09:51   MM CLIP PLACEMENT RIGHT  Result Date: 08/09/2023 CLINICAL DATA:  Confirmation of clip placement after ultrasound-guided core needle biopsy (x 2) of RIGHT breast masses. EXAM: 2D and 3D DIAGNOSTIC RIGHT MAMMOGRAM POST ULTRASOUND BIOPSY COMPARISON:  Previous exam(s). FINDINGS: 2D and 3D full field CC and mediolateral mammographic images were obtained following ultrasound guided biopsy of 2 masses in the RIGHT breast. The heart shaped tissue marking clip is appropriately positioned at the site of the biopsied mass in the lower breast. The coil shaped tissue marking clip is appropriately positioned at  the site of the biopsied mass in the UPPER INNER QUADRANT. Expected post biopsy changes are present at both sites without evidence of hematoma. IMPRESSION: 1. Appropriate positioning of the heart shaped biopsy marking clip at the site of the biopsied mass in the lower breast. 2. Appropriate positioning of the coil shaped biopsy marking clip at the  site of the biopsied mass in the UPPER INNER QUADRANT Final Assessment: Post Procedure Mammograms for Marker Placement BI-RADS CATEGORY  32M: Post-Procedure Mammogram for Marker Placement Electronically Signed   By: Hulan Saas M.D.   On: 08/09/2023 09:52   NM Bone Scan Whole Body  Result Date: 08/08/2023 CLINICAL DATA:  Breast cancer. EXAM: NUCLEAR MEDICINE WHOLE BODY BONE SCAN TECHNIQUE: Whole body anterior and posterior images were obtained approximately 3 hours after intravenous injection of radiopharmaceutical. RADIOPHARMACEUTICALS:  19.8 mCi Technetium-24m MDP IV COMPARISON:  CT chest abdomen pelvis 08/01/2023. FINDINGS: No abnormal radiotracer accumulation in the axial or appendicular skeleton. IMPRESSION: No evidence of metastatic disease. Electronically Signed   By: Leanna Battles M.D.   On: 08/08/2023 14:15   ECHOCARDIOGRAM COMPLETE  Result Date: 08/06/2023    ECHOCARDIOGRAM REPORT   Patient Name:   Ann Daugherty Date of Exam: 08/06/2023 Medical Rec #:  [id number]         Height:       66.0 in Accession #:    [id number]        Weight:       151.2 lb Date of Birth:  1970/11/21          BSA:          1.776 m Patient Age:    52 years          BP:           140/74 mmHg Patient Gender: F                 HR:           87 bpm. Exam Location:  Outpatient Procedure: 2D Echo, Cardiac Doppler and Color Doppler Indications:    Chemo Z09  History:        Patient has no prior history of Echocardiogram examinations.                 Risk Factors:Former Smoker.  Sonographer:    Dondra Prader RVT RCS Referring Phys: [id number] Serena Croissant  Sonographer Comments:  Technically challenging study due to limited acoustic windows, Technically difficult study due to poor echo windows, suboptimal parasternal window, suboptimal apical window and suboptimal subcostal window. Parasternal images were difficult due to bandage from recent ulcerated tumor. Malignant neoplasm of upper-inner quadrant of left female breast IMPRESSIONS  1. Left ventricular ejection fraction, by estimation, is 65 to 70%. The left ventricle has normal function. The left ventricle has no regional wall motion abnormalities. Left ventricular diastolic parameters were normal.  2. Right ventricular systolic function is normal. The right ventricular size is normal.  3. The mitral valve is normal in structure. No evidence of mitral valve regurgitation. No evidence of mitral stenosis.  4. The aortic valve is grossly normal. Aortic valve regurgitation is not visualized. No aortic stenosis is present.  5. The inferior vena cava is normal in size with greater than 50% respiratory variability, suggesting right atrial pressure of 3 mmHg. FINDINGS  Left Ventricle: Left ventricular ejection fraction, by estimation, is 65 to 70%. The left ventricle has normal function. The left ventricle has no regional wall motion abnormalities. The left ventricular internal cavity size was normal in size. There is  no left ventricular hypertrophy. Left ventricular diastolic parameters were normal. Right Ventricle: The right ventricular size is normal. No increase in right ventricular wall thickness. Right ventricular systolic function is normal. Left Atrium: Left atrial size was normal in size. Right Atrium: Right atrial size was normal  in size. Pericardium: There is no evidence of pericardial effusion. Mitral Valve: The mitral valve is normal in structure. No evidence of mitral valve regurgitation. No evidence of mitral valve stenosis. Tricuspid Valve: The tricuspid valve is normal in structure. Tricuspid valve regurgitation is not  demonstrated. No evidence of tricuspid stenosis. Aortic Valve: The aortic valve is grossly normal. Aortic valve regurgitation is not visualized. No aortic stenosis is present. Aortic valve mean gradient measures 4.0 mmHg. Aortic valve peak gradient measures 7.8 mmHg. Aortic valve area, by VTI measures 2.42 cm. Pulmonic Valve: The pulmonic valve was normal in structure. Pulmonic valve regurgitation is not visualized. No evidence of pulmonic stenosis. Aorta: The aortic root is normal in size and structure. Venous: The inferior vena cava is normal in size with greater than 50% respiratory variability, suggesting right atrial pressure of 3 mmHg. IAS/Shunts: No atrial level shunt detected by color flow Doppler.  LEFT VENTRICLE PLAX 2D LVIDd:         3.80 cm   Diastology LVIDs:         2.10 cm   LV e' medial:    12.00 cm/s LV PW:         0.90 cm   LV E/e' medial:  6.1 LV IVS:        0.70 cm   LV e' lateral:   14.10 cm/s LVOT diam:     1.80 cm   LV E/e' lateral: 5.2 LV SV:         60 LV SV Index:   34 LVOT Area:     2.54 cm  RIGHT VENTRICLE             IVC RV S prime:     14.40 cm/s  IVC diam: 1.60 cm TAPSE (M-mode): 2.0 cm LEFT ATRIUM             Index       RIGHT ATRIUM           Index LA Vol (A2C):   17.2 ml 9.69 ml/m  RA Area:     10.40 cm LA Vol (A4C):   16.6 ml 9.35 ml/m  RA Volume:   21.40 ml  12.05 ml/m LA Biplane Vol: 17.3 ml 9.74 ml/m  AORTIC VALVE                    PULMONIC VALVE AV Area (Vmax):    2.64 cm     PV Vmax:       1.01 m/s AV Area (Vmean):   2.57 cm     PV Peak grad:  4.1 mmHg AV Area (VTI):     2.42 cm AV Vmax:           140.00 cm/s AV Vmean:          93.900 cm/s AV VTI:            0.247 m AV Peak Grad:      7.8 mmHg AV Mean Grad:      4.0 mmHg LVOT Vmax:         145.00 cm/s LVOT Vmean:        94.700 cm/s LVOT VTI:          0.235 m LVOT/AV VTI ratio: 0.95  AORTA Ao Root diam: 2.70 cm MITRAL VALVE MV Area (PHT): 3.77 cm    SHUNTS MV Decel Time: 201 msec    Systemic VTI:  0.24 m MV E  velocity: 73.70 cm/s  Systemic Diam: 1.80  cm MV A velocity: 72.40 cm/s MV E/A ratio:  1.02 Charlton Haws MD Electronically signed by Charlton Haws MD Signature Date/Time: 08/06/2023/11:24:42 AM    Final    CT CHEST ABDOMEN PELVIS W CONTRAST  Result Date: 08/03/2023 CLINICAL DATA:  Breast cancer. Base of breast cancer. Ulcerating breast cancer of the LEFT breast. * Tracking Code: BO * EXAM: CT CHEST, ABDOMEN, AND PELVIS WITH CONTRAST TECHNIQUE: Multidetector CT imaging of the chest, abdomen and pelvis was performed following the standard protocol during bolus administration of intravenous contrast. RADIATION DOSE REDUCTION: This exam was performed according to the departmental dose-optimization program which includes automated exposure control, adjustment of the mA and/or kV according to patient size and/or use of iterative reconstruction technique. CONTRAST:  OMNIPAQUE IOHEXOL 300 MG/ML  SOLN COMPARISON:  None Available. FINDINGS: CT CHEST FINDINGS Cardiovascular: No significant vascular findings. Normal heart size. No pericardial effusion. Mediastinum/Nodes: No supraclavicular adenopathy. Several prominent LEFT axillary lymph nodes are not pathologically enlarged but concerning. No mediastinal or hilar adenopathy. No pericardial fluid. Esophagus normal. Lungs/Pleura: No suspicious pulmonary nodules. Normal pleural. Airways normal. Mild pleural thickening dependently in the lower lobes is favored benign. Musculoskeletal: No aggressive osseous lesion. Within the LEFT breast there is a large central mass measuring 5.4 cm (image 33/2) there is a mass in the medial superior LEFT breast measuring 4 cm (image 21/2. This medial LEFT breast mass is extends to the skin surface. CT ABDOMEN AND PELVIS FINDINGS Hepatobiliary: Rounded lesion the posterior RIGHT hepatic lobe measuring 3.2 cm (image 60/series 2) Postcholecystectomy Pancreas: Pancreas is normal. No ductal dilatation. No pancreatic inflammation. Spleen:  Normal spleen Adrenals/urinary tract: Adrenal glands and kidneys are normal. The ureters and bladder normal. Stomach/Bowel: Stomach, small bowel, appendix, and cecum are normal. The colon and rectosigmoid colon are normal. Vascular/Lymphatic: Abdominal aorta is normal caliber. There is no retroperitoneal or periportal lymphadenopathy. No pelvic lymphadenopathy. Reproductive: Uterus and adnexa unremarkable. Other: No omental or peritoneal nodularity. Musculoskeletal: No aggressive osseous lesion. IMPRESSION: CHEST: 1. Two LEFT breast masses consistent with primary breast cancer. 2. Prominent LEFT axillary lymph nodes are concerning for local nodal metastasis. 3. No pulmonary metastasis.  No central thoracic nodal metastasis. PELVIS: 1. Round lesion in the posterior RIGHT hepatic lobe is highly concerning for metastatic breast carcinoma. 2. No evidence of metastatic adenopathy in the abdomen pelvis. 3. No evidence skeletal metastasis. Electronically Signed   By: Genevive Bi M.D.   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 She presented with an ulcerating palpable mass involving the UPPER INNER QUADRANT and a palpable mass involving the LOWER OUTER QUADRANT. MRI is performed to determine extent of disease prior to neoadjuvant chemotherapy. EXAM: BILATERAL BREAST MRI WITH AND WITHOUT CONTRAST TECHNIQUE: Multiplanar, multisequence MR images of both breasts were obtained prior to and following the intravenous administration of 7 ml of Gadavist. Three-dimensional MR images were rendered by post-processing of the original MR data on an independent workstation. The three-dimensional MR images were interpreted, and findings are reported in the following complete MRI report for this study. Three dimensional images were evaluated at  the independent interpreting workstation using the DynaCAD thin client. COMPARISON:  No prior MRI. Prior mammography and LEFT breast ultrasound, most recently at the time of biopsy on 07/19/2023. FINDINGS: Breast composition: c. Heterogeneous fibroglandular tissue. Background parenchymal enhancement: Moderate.  RIGHT breast: Heterogeneous enhancing mass in the LOWER OUTER QUADRANT at middle depth (post-contrast images 18-22) measuring approximately 0.8 x 0.6 x 0.8 cm (AP x transverse x craniocaudal), demonstrating plateau kinetics. The mass localizes to the approximate 6:30-7 o'clock axis. Enhancing mass in the UPPER INNER QUADRANT at anterior depth (post-contrast images 74-77) measuring approximately 0.6 x 0.4 x 0.7 cm, demonstrating plateau kinetics. The mass localizes to the approximate 1 o'clock axis. No suspicious mass or abnormal enhancement elsewhere. LEFT breast: Large enhancing mass with irregular lobulated margins in the LOWER OUTER QUADRANT at anterior to middle depth, biopsy-proven grade 3 IDC and DCIS, associated with blooming artifact from the ribbon shaped tissue marking clip. Maximum measurements of the mass are approximately 5.5 x 5.6 x 6.1 cm. The mass demonstrates plateau and washout kinetics. The mass extends up to the skin surface, though there is no abnormal enhancement of the overlying skin to confirm skin involvement, and a small fat plane is visible between the mass and the skin surface on the post-contrast images. Large enhancing mass with central necrosis in the UPPER INNER QUADRANT at middle to posterior depth, biopsy-proven grade 3 IDC, with extensive skin involvement, associated with blooming artifact from the coil shaped tissue marking clip. Maximum measurements of the mass approximate 5.1 x 4.8 x 5.8 cm. The mass demonstrates plateau and washout kinetics. Superficial irregular enhancing mass in the LOWER INNER QUADRANT (post contrast images 17-21) measuring approximately 0.6 x 0.6 x  0.7 cm, demonstrating plateau kinetics. The mass localizes to the approximate 7-7:30 o'clock axis. Linear non-mass enhancement spanning approximately 2.6 cm in the lower breast at the near 6 o'clock axis (post contrast images 16-21), demonstrating washout kinetics. This non-mass enhancement is contiguous with the inferior margin of the mass in the LOWER OUTER QUADRANT. Enhancing mass in the axillary tail (post-contrast images 97-100) measuring approximately 0.6 x 0.4 x 0.7 cm, demonstrating plateau kinetics. This likely represents an intramammary lymph node which was not present on the patient's prior mammograms. Lymph nodes: No abnormal appearing lymph nodes other than the likely intramammary lymph node in the LEFT axillary tail. Ancillary findings:  None. IMPRESSION: 1. Two indeterminate enhancing masses in the RIGHT breast. A mass in the LOWER OUTER QUADRANT at middle depth measures approximately 0.8 cm. A mass in the UPPER INNER QUADRANT measures approximately 0.6 cm. 2. Large enhancing mass measuring approximately 6 cm in the LOWER OUTER QUADRANT of the LEFT breast at anterior middle depth, biopsy-proven IDC and DCIS. This mass abuts the skin surface, though there is no abnormal enhancement in the overlying skin to confirm skin involvement. 3. Large enhancing mass measuring approximately 6 cm in the UPPER INNER QUADRANT of the LEFT breast at middle to posterior depth, biopsy-proven IDC. There is extensive skin involvement associated with this mass. 4. Superficial 0.7 cm mass in the LOWER INNER QUADRANT of the LEFT breast, likely a satellite malignant lesion. 5. Enhancing 0.7 cm mass in the axillary tail of the LEFT breast, likely an abnormal intramammary lymph node which was not present on prior mammograms. 6. No pathologic lymphadenopathy elsewhere. RECOMMENDATION: Ultrasound-guided core needle biopsy of the 2 indeterminate RIGHT breast masses. BI-RADS CATEGORY  4: Suspicious. Electronically Signed   By:  Hulan Saas M.D.   On: 08/02/2023 11:45   [place] LT BREAST BX W LOC DEV 1ST LESION IMG BX SPEC US GUIDE  Addendum Date: 07/24/2023   ADDENDUM REPORT: 07/24/2023 07:26 ADDENDUM: Pathology revealed GRADE III INVASIVE DUCTAL CARCINOMA WITH NECROSIS of the LEFT breast,  5 o'clock mass, (ribbon clip). This was found to be concordant by Dr. Edwin Cap. Pathology revealed GRADE II INVASIVE DUCTAL CARCINOMA WITH NECROSIS, DUCTAL CARCINOMA IN SITU, CRIBRIFORM, HIGH NUCLEAR GRADE WITH FOCAL NECROSIS of the LEFT breast, 10:30 o'clock mass, (coil clip. This was found to be concordant by Dr. Edwin Cap. Pathology results were discussed with the patient by telephone. The patient reported doing well after the biopsies with tenderness and swelling at the sites. Post biopsy instructions and care were reviewed and questions were answered. The patient was encouraged to call [facility] for any additional concerns. My direct phone number was provided. Surgical consultation has been arranged with Dr. Almond Lint, per patient request, at [facility] on July 23, 2023. Medical oncology consultation has been requested with Dr. Serena Croissant, per patient request, at [facility]. Recommendation for a bilateral breast MRI and staging studies for further evaluation of extent of disease given the high grade histology and skin involvement. Pathology results reported by Rene Kocher, RN on 07/20/2023. Electronically Signed   By: Edwin Cap M.D.   On: 07/24/2023 07:26   Result Date: 07/24/2023 CLINICAL DATA:  Patient presents for ultrasound-guided core biopsy of a 3.3 cm mass in the left breast at the 5 o'clock position and ultrasound-guided core biopsy of a 4.3 cm mass in the left breast at the 10:30 position. EXAM: ULTRASOUND GUIDED LEFT BREAST CORE NEEDLE BIOPSY COMPARISON:  Previous exam(s). PROCEDURE: I met with the patient and we discussed the procedure of  ultrasound-guided biopsy, including benefits and alternatives. We discussed the high likelihood of a successful procedure. We discussed the risks of the procedure, including infection, bleeding, tissue injury, clip migration, and inadequate sampling. Informed written consent was given. The usual time-out protocol was performed immediately prior to the procedure. SITE 1: LEFT BREAST 5 O'CLOCK 3.3 CM MASS WITH CALCIFICATIONS: Lesion quadrant: LOWER OUTER Using sterile technique and 1% Lidocaine as local anesthetic, under direct ultrasound visualization, a 14 gauge spring-loaded device was used to perform biopsy of the mass in the left breast the 5 o'clock position using a lateral to medial approach. At the conclusion of the procedure a ribbon shaped tissue marker clip was deployed into the biopsy cavity. Follow up 2 view mammogram was performed and dictated separately. SITE 2: LEFT BREAST 10:30 POSITION 4.3 CM MASS WITH OVERLYING SKIN ULCERATION: Lesion quadrant: UPPER INNER Using sterile technique and 1% Lidocaine as local anesthetic, under direct ultrasound visualization, a 14 gauge spring-loaded device was used to perform biopsy of the mass in the left breast at the 10:30 position using a lateral to medial approach. At the conclusion of the procedure a coil shaped tissue marker clip was deployed into the biopsy cavity. Follow up 2 view mammogram was performed and dictated separately. IMPRESSION: 1. Ultrasound-guided core biopsy of the mass in the left breast at the 5 o'clock position, at site of ribbon shaped biopsy marking clip. 2. Ultrasound-guided core biopsy of the mass in the left breast at the 10:30 position, at site of coil shaped biopsy marking clip. Electronically Signed: By: Edwin Cap M.D. On: 07/19/2023 15:37   [place] LT BREAST BX W LOC DEV EA ADD LESION IMG BX SPEC US GUIDE  Addendum Date: 07/24/2023   ADDENDUM REPORT: 07/24/2023 07:26 ADDENDUM: Pathology revealed GRADE III INVASIVE DUCTAL  CARCINOMA WITH NECROSIS of the LEFT breast, 5 o'clock mass, (ribbon clip). This was found to be concordant by Dr. Edwin Cap. Pathology revealed GRADE  II INVASIVE DUCTAL CARCINOMA WITH NECROSIS, DUCTAL CARCINOMA IN SITU, CRIBRIFORM, HIGH NUCLEAR GRADE WITH FOCAL NECROSIS of the LEFT breast, 10:30 o'clock mass, (coil clip. This was found to be concordant by Dr. Edwin Cap. Pathology results were discussed with the patient by telephone. The patient reported doing well after the biopsies with tenderness and swelling at the sites. Post biopsy instructions and care were reviewed and questions were answered. The patient was encouraged to call [facility] for any additional concerns. My direct phone number was provided. Surgical consultation has been arranged with Dr. Almond Lint, per patient request, at [facility] on July 23, 2023. Medical oncology consultation has been requested with Dr. Serena Croissant, per patient request, at [facility]. Recommendation for a bilateral breast MRI and staging studies for further evaluation of extent of disease given the high grade histology and skin involvement. Pathology results reported by Rene Kocher, RN on 07/20/2023. Electronically Signed   By: Edwin Cap M.D.   On: 07/24/2023 07:26   Result Date: 07/24/2023 CLINICAL DATA:  Patient presents for ultrasound-guided core biopsy of a 3.3 cm mass in the left breast at the 5 o'clock position and ultrasound-guided core biopsy of a 4.3 cm mass in the left breast at the 10:30 position. EXAM: ULTRASOUND GUIDED LEFT BREAST CORE NEEDLE BIOPSY COMPARISON:  Previous exam(s). PROCEDURE: I met with the patient and we discussed the procedure of ultrasound-guided biopsy, including benefits and alternatives. We discussed the high likelihood of a successful procedure. We discussed the risks of the procedure, including infection, bleeding, tissue injury, clip migration, and  inadequate sampling. Informed written consent was given. The usual time-out protocol was performed immediately prior to the procedure. SITE 1: LEFT BREAST 5 O'CLOCK 3.3 CM MASS WITH CALCIFICATIONS: Lesion quadrant: LOWER OUTER Using sterile technique and 1% Lidocaine as local anesthetic, under direct ultrasound visualization, a 14 gauge spring-loaded device was used to perform biopsy of the mass in the left breast the 5 o'clock position using a lateral to medial approach. At the conclusion of the procedure a ribbon shaped tissue marker clip was deployed into the biopsy cavity. Follow up 2 view mammogram was performed and dictated separately. SITE 2: LEFT BREAST 10:30 POSITION 4.3 CM MASS WITH OVERLYING SKIN ULCERATION: Lesion quadrant: UPPER INNER Using sterile technique and 1% Lidocaine as local anesthetic, under direct ultrasound visualization, a 14 gauge spring-loaded device was used to perform biopsy of the mass in the left breast at the 10:30 position using a lateral to medial approach. At the conclusion of the procedure a coil shaped tissue marker clip was deployed into the biopsy cavity. Follow up 2 view mammogram was performed and dictated separately. IMPRESSION: 1. Ultrasound-guided core biopsy of the mass in the left breast at the 5 o'clock position, at site of ribbon shaped biopsy marking clip. 2. Ultrasound-guided core biopsy of the mass in the left breast at the 10:30 position, at site of coil shaped biopsy marking clip. Electronically Signed: By: Edwin Cap M.D. On: 07/19/2023 15:37   MM CLIP PLACEMENT LEFT  Result Date: 07/19/2023 CLINICAL DATA:  Post ultrasound-guided core biopsy of a mass in the left breast at the 5 o'clock position and ultrasound-guided core biopsy of a mass in the left breast at the 10:30 position. EXAM: 3D DIAGNOSTIC LEFT MAMMOGRAM POST ULTRASOUND BIOPSY COMPARISON:  Previous exam(s). FINDINGS: 3D Mammographic images were obtained following ultrasound-guided core biopsy  of a mass in the left breast at the 5 o'clock  position and ultrasound-guided core biopsy of a mass in the left breast at the 10:30 position. A ribbon shaped biopsy marking clip is present the site of the biopsied mass in the left breast at the 5 o'clock position. A coil shaped biopsy marking clip is present at the site of the biopsied mass in the left breast at the 10:30 position. IMPRESSION: 1. Ribbon shaped biopsy marking clip at site of biopsied mass in the left breast at the 5 o'clock position. 2. Coil shaped biopsy marking clip at site of biopsied mass in the left breast at the 10:30 position. Final Assessment: Post Procedure Mammograms for Marker Placement BI-RADS CATEGORY  54M: Post-Procedure Mammogram for Marker Placement Electronically Signed   By: Edwin Cap M.D.   On: 07/19/2023 15:36   MS 3D DIAG MAMMO BILAT BR (aka MM)  Result Date: 07/19/2023 CLINICAL DATA:  52 year old female with 2 palpable palpable masses in the left breast which she noticed approximally 6 months ago. Patient states one of the masses started coming through the skin approximately 1 month ago. EXAM: DIGITAL DIAGNOSTIC BILATERAL MAMMOGRAM WITH TOMOSYNTHESIS AND CAD; ULTRASOUND LEFT BREAST LIMITED TECHNIQUE: Bilateral digital diagnostic mammography and breast tomosynthesis was performed. The images were evaluated with computer-aided detection. ; Targeted ultrasound examination of the left breast was performed. COMPARISON:  Previous exam(s). ACR Breast Density Category c: The breasts are heterogeneously dense, which may obscure small masses. FINDINGS: There is an irregular mass involving the upper inner left breast measuring approximately 4 cm and broadly abutting the skin surface of the upper slightly inner left breast. There is an additional large irregular mass in the central to lower outer left breast measuring 3.8 cm. There are pleomorphic calcifications involving this mass with the mass and calcifications measuring up to  6.7 cm. There is associated skin thickening. There are no suspicious masses or calcifications seen in the right breast. Physical examination reveals enlargement of the left breast with a large palpable mass involving the central to lower outer left breast as well as upper slightly inner left breast. There is a large approximately 5 cm area of ulcerated skin overlying the mass in the upper slightly inner left breast. Targeted ultrasound of the left breast was performed. There is an irregular hypoechoic mass in the left breast at 5 o'clock 5 cm from nipple measuring 3.1 x 3.3 x 3.1 cm. There is an irregular hypoechoic mass in the left breast at the 10:30 position 11 cm from nipple measuring 4.3 x 2.7 x 3.3 cm. Adjacent skin thickening and subcutaneous edema surrounding this mass. No definite abnormal axillary lymph nodes identified. IMPRESSION: 1. Suspicious 3.3 cm palpable mass with associated calcifications in the left breast at the 5 o'clock position, with mass and calcifications measuring over 6 cm. 2. Suspicious palpable mass in the left breast at the 10:30 position measuring 4.3 cm with associated large area of overlying skin ulceration. RECOMMENDATION: Recommend ultrasound-guided core biopsy of the 2 masses in the left breast at the 5 o'clock and 10 30 positions. I have discussed the findings and recommendations with the patient. If applicable, a reminder letter will be sent to the patient regarding the next appointment. BI-RADS CATEGORY  5: Highly suggestive of malignancy. Electronically Signed   By: Edwin Cap M.D.   On: 07/19/2023 14:49   [place] LIMITED ULTRASOUND INCLUDING AXILLA LEFT BREAST   Result Date: 07/19/2023 CLINICAL DATA:  52 year old female with 2 palpable palpable masses in the left breast which she noticed approximally 6 months ago.  Patient states one of the masses started coming through the skin approximately 1 month ago. EXAM: DIGITAL DIAGNOSTIC BILATERAL MAMMOGRAM WITH TOMOSYNTHESIS  AND CAD; ULTRASOUND LEFT BREAST LIMITED TECHNIQUE: Bilateral digital diagnostic mammography and breast tomosynthesis was performed. The images were evaluated with computer-aided detection. ; Targeted ultrasound examination of the left breast was performed. COMPARISON:  Previous exam(s). ACR Breast Density Category c: The breasts are heterogeneously dense, which may obscure small masses. FINDINGS: There is an irregular mass involving the upper inner left breast measuring approximately 4 cm and broadly abutting the skin surface of the upper slightly inner left breast. There is an additional large irregular mass in the central to lower outer left breast measuring 3.8 cm. There are pleomorphic calcifications involving this mass with the mass and calcifications measuring up to 6.7 cm. There is associated skin thickening. There are no suspicious masses or calcifications seen in the right breast. Physical examination reveals enlargement of the left breast with a large palpable mass involving the central to lower outer left breast as well as upper slightly inner left breast. There is a large approximately 5 cm area of ulcerated skin overlying the mass in the upper slightly inner left breast. Targeted ultrasound of the left breast was performed. There is an irregular hypoechoic mass in the left breast at 5 o'clock 5 cm from nipple measuring 3.1 x 3.3 x 3.1 cm. There is an irregular hypoechoic mass in the left breast at the 10:30 position 11 cm from nipple measuring 4.3 x 2.7 x 3.3 cm. Adjacent skin thickening and subcutaneous edema surrounding this mass. No definite abnormal axillary lymph nodes identified. IMPRESSION: 1. Suspicious 3.3 cm palpable mass with associated calcifications in the left breast at the 5 o'clock position, with mass and calcifications measuring over 6 cm. 2. Suspicious palpable mass in the left breast at the 10:30 position measuring 4.3 cm with associated large area of overlying skin ulceration.  RECOMMENDATION: Recommend ultrasound-guided core biopsy of the 2 masses in the left breast at the 5 o'clock and 10 30 positions. I have discussed the findings and recommendations with the patient. If applicable, a reminder letter will be sent to the patient regarding the next appointment. BI-RADS CATEGORY  5: Highly suggestive of malignancy. Electronically Signed   By: Edwin Cap M.D.   On: 07/19/2023 14:49    Labs:  CBC: Recent Labs    07/26/23 1406 08/15/23 1111 08/17/23 1115  WBC 6.5 10.6* 7.2  HGB 12.0 11.3* 11.5*  HCT 35.4* 33.4* 36.5  PLT 342 306 313    COAGS: Recent Labs    08/17/23 1115  INR 1.0    BMP: Recent Labs    07/26/23 1406 08/15/23 1111  NA 137 137  K 3.8 3.1*  CL 104 105  CO2 25 26  GLUCOSE 100* 123*  BUN 14 8  CALCIUM 9.4 9.4  CREATININE 0.70 0.66  GFRNONAA >60 >60    LIVER FUNCTION TESTS: Recent Labs    07/26/23 1406 08/15/23 1111  BILITOT 0.3 0.3  AST 15 15  ALT 12 14  ALKPHOS 73 62  PROT 7.1 6.9  ALBUMIN 4.2 4.1     Assessment and Plan:  52 y.o. Female outpatient. History of GERD, breast cancer. Found to have a liver mass. Request is for liver mass biopsy for further evaluation of metastases. Case approved by IR Attending Dr. Edwyna Ready  CT CAP from 9.25.24 reads  Round lesion in the posterior RIGHT hepatic lobe is highly concerning for metastatic  breast carcinoma. Labs pending. All medications are within acceptable parameters. NKDA. Patient has been NPO since midnight.  Risks and benefits of liver biopsy was discussed with the patient and/or patient's family including, but not limited to bleeding, infection, damage to adjacent structures or low yield requiring additional tests.  All of the questions were answered and there is agreement to proceed.  Consent signed and in chart.   Thank you for this interesting consult.  I greatly enjoyed meeting Ann Daugherty and look forward to participating in their care.  A copy of  this report was sent to the requesting provider on this date.  Electronically Signed: Alene Mires, NP 08/17/2023, 1:03 PM   I spent a total of  30 Minutes   in face to face in clinical consultation, greater than 50% of which was counseling/coordinating care for liver mass biopsy

## 2023-08-17 NOTE — Progress Notes (Signed)
RN placed call per MD request to [facility] Pathology department requesting breast prognostic panel be added to liver bx from today.  [name] with [facility] verbalized understanding and stated she will have panel added.

## 2023-08-17 NOTE — Procedures (Signed)
Interventional Radiology Procedure Note  Procedure: Ultrasound guided liver mass biopsy  Findings: Please refer to procedural dictation for full description. Right lobe liver mass 18 ga core x2.  Gelfoam slurry needle track embolization.  Complications: None immediate  Estimated Blood Loss: < 5 ml  Recommendations: Strict 3 hour bedrest. Follow Pathology results.   Marliss Coots, MD

## 2023-08-20 ENCOUNTER — Encounter: Payer: Self-pay | Admitting: Hematology and Oncology

## 2023-08-20 NOTE — Telephone Encounter (Signed)
TC to [facility] Pathology to confirm Breast prognostic panel was added to liver biopsy per [place] test was just placed on Dr Preston Fleeting desk and will be added once it is looked at.

## 2023-08-21 ENCOUNTER — Other Ambulatory Visit: Payer: Self-pay

## 2023-08-21 ENCOUNTER — Inpatient Hospital Stay: Payer: Medicaid Other

## 2023-08-21 ENCOUNTER — Inpatient Hospital Stay (HOSPITAL_BASED_OUTPATIENT_CLINIC_OR_DEPARTMENT_OTHER): Payer: Medicaid Other | Admitting: Adult Health

## 2023-08-21 ENCOUNTER — Other Ambulatory Visit: Payer: No Typology Code available for payment source

## 2023-08-21 ENCOUNTER — Encounter: Payer: Self-pay | Admitting: Adult Health

## 2023-08-21 ENCOUNTER — Telehealth: Payer: Self-pay

## 2023-08-21 ENCOUNTER — Encounter: Payer: No Typology Code available for payment source | Admitting: Genetic Counselor

## 2023-08-21 ENCOUNTER — Encounter: Payer: Self-pay | Admitting: *Deleted

## 2023-08-21 ENCOUNTER — Encounter: Payer: No Typology Code available for payment source | Admitting: Dietician

## 2023-08-21 VITALS — BP 121/71 | HR 79 | Temp 97.7°F | Resp 18 | Ht 66.0 in | Wt 149.3 lb

## 2023-08-21 DIAGNOSIS — Z5111 Encounter for antineoplastic chemotherapy: Secondary | ICD-10-CM | POA: Diagnosis not present

## 2023-08-21 DIAGNOSIS — C50212 Malignant neoplasm of upper-inner quadrant of left female breast: Secondary | ICD-10-CM

## 2023-08-21 LAB — CMP (CANCER CENTER ONLY)
ALT: 12 U/L (ref 0–44)
AST: 12 U/L — ABNORMAL LOW (ref 15–41)
Albumin: 4.2 g/dL (ref 3.5–5.0)
Alkaline Phosphatase: 82 U/L (ref 38–126)
Anion gap: 6 (ref 5–15)
BUN: 12 mg/dL (ref 6–20)
CO2: 26 mmol/L (ref 22–32)
Calcium: 9.4 mg/dL (ref 8.9–10.3)
Chloride: 103 mmol/L (ref 98–111)
Creatinine: 0.6 mg/dL (ref 0.44–1.00)
GFR, Estimated: >60 mL/min (ref >60)
Glucose, Bld: 96 mg/dL (ref 70–99)
Potassium: 4.1 mmol/L (ref 3.5–5.1)
Sodium: 135 mmol/L (ref 135–145)
Total Bilirubin: 0.4 mg/dL (ref 0.3–1.2)
Total Protein: 7 g/dL (ref 6.5–8.1)

## 2023-08-21 LAB — CBC WITH DIFFERENTIAL (CANCER CENTER ONLY)
Abs Immature Granulocytes: 0 10*3/uL (ref 0.00–0.07)
Basophils Absolute: 0 10*3/uL (ref 0.0–0.1)
Basophils Relative: 2 %
Eosinophils Absolute: 0.1 10*3/uL (ref 0.0–0.5)
Eosinophils Relative: 7 %
HCT: 35.4 % — ABNORMAL LOW (ref 36.0–46.0)
Hemoglobin: 11.7 g/dL — ABNORMAL LOW (ref 12.0–15.0)
Immature Granulocytes: 0 %
Lymphocytes Relative: 70 %
Lymphs Abs: 0.7 10*3/uL (ref 0.7–4.0)
MCH: 29.8 pg (ref 26.0–34.0)
MCHC: 33.1 g/dL (ref 30.0–36.0)
MCV: 90.3 fL (ref 80.0–100.0)
Monocytes Absolute: 0.1 10*3/uL (ref 0.1–1.0)
Monocytes Relative: 5 %
Neutro Abs: 0.2 10*3/uL — CL (ref 1.7–7.7)
Neutrophils Relative %: 16 %
Platelet Count: 186 10*3/uL (ref 150–400)
RBC: 3.92 MIL/uL (ref 3.87–5.11)
RDW: 12.3 % (ref 11.5–15.5)
Smear Review: NORMAL
WBC Count: 1 10*3/uL — ABNORMAL LOW (ref 4.0–10.5)
nRBC: 0 % (ref 0.0–0.2)

## 2023-08-21 NOTE — Progress Notes (Signed)
[facility] Cancer Follow up:    Ann Daugherty, [name] [address] [place] [address]   DIAGNOSIS:  Cancer Staging  Malignant neoplasm of upper-inner quadrant of left female breast [facility]) Staging form: Breast, AJCC 8th Edition - Clinical: Stage IIIB (cT4b, cN0, cM0, G3, ER+, PR+, HER2-) - Signed by Serena Croissant, MD on 07/26/2023 Histologic grading system: 3 grade system - Pathologic: Stage IV (pM1) - Signed by Loa Socks, NP on 08/21/2023   SUMMARY OF ONCOLOGIC HISTORY: Oncology History  Malignant neoplasm of upper-inner quadrant of left female breast (HCC)  07/19/2023 Initial Diagnosis   Palpable left breast mass for 6 months.  Mammogram and ultrasound revealed large masses.  10:30 position: 4.3 cm with skin thickening and ulceration: Grade 2 IDC with high-grade DCIS ER 95%, PR 95%, Ki67 30%, HER2 2+ by IHC, FISH negative ratio 1.37; 5 o'clock position: 3.3 cm with calcifications spanning 6.7 cm, axilla negative, biopsy: Grade 3 IDC with necrosis ER 95% PR 90% Ki-67 60% HER2 1+ negative   07/26/2023 Cancer Staging   Staging form: Breast, AJCC 8th Edition - Clinical: Stage IIIB (cT4b, cN0, cM0, G3, ER+, PR+, HER2-) - Signed by Serena Croissant, MD on 07/26/2023 Histologic grading system: 3 grade system   08/15/2023 -  Chemotherapy   Patient is on Treatment Plan : BREAST ADJUVANT DOSE DENSE AC q14d / PACLitaxel q7d     08/17/2023 Initial Biopsy   Liver biopsy: consistent with metastatic carcinoma, breast primary.     08/21/2023 Cancer Staging   Staging form: Breast, AJCC 8th Edition - Pathologic: Stage IV (pM1) - Signed by Loa Socks, NP on 08/21/2023     CURRENT THERAPY: adriamycin/cytoxan cycle 1 day 8  INTERVAL HISTORY: AERIELLE Daugherty 52 y.o. female returns for follow up after her first cycle of adriamycin and cytoxan last week.  She had mild burning in her eyes at the end of cytoxan and burning nose at the end of cytoxan, but  otherwise tolerated it well.  She denies nausea/vomiting.  After receiving Udenyca on day 3 she developed chills and low grade fever.  She denies it being over 100.4.  She also had constipation that was challenging to navigate on Friday.  Through taking Senna S she overcame the constipation and has since been moving her bowels regularly.   She has noted a breast tingling in her left breast where the tumor.  She denies any pain or worsening in her breast since then.  She underwent liver biopsy on 08/17/2023 that demonstrated metastatic carcinoma, consistent with breast primary.    Patient Active Problem List   Diagnosis Date Noted   Port-A-Cath in place 08/15/2023   Malignant neoplasm of upper-inner quadrant of left female breast (HCC) 07/20/2023   Depression, major, single episode, mild (HCC) 04/05/2021   Tobacco abuse 01/30/2018   Anxiety 01/30/2018   Attention deficit disorder 01/30/2018   Vitamin D deficiency 01/30/2018    has No Known Allergies.  MEDICAL HISTORY: Past Medical History:  Diagnosis Date   ADD (attention deficit disorder) 2015   Does not take any medication   Anxiety    Cancer (HCC)    breast   Depression    GERD (gastroesophageal reflux disease)    History of chicken pox    PONV (postoperative nausea and vomiting)     SURGICAL HISTORY: Past Surgical History:  Procedure Laterality Date   BACK SURGERY     BREAST BIOPSY Left 07/19/2023   [place] LT BREAST  BX W LOC DEV EA ADD LESION IMG BX SPEC US GUIDE 07/19/2023 [facility]   BREAST BIOPSY Left 07/19/2023   [place] LT BREAST BX W LOC DEV 1ST LESION IMG BX SPEC US GUIDE 07/19/2023 [facility]   BREAST BIOPSY Right 08/09/2023   [place] RT BREAST BX W LOC DEV 1ST LESION IMG BX SPEC US GUIDE 08/09/2023 [facility]   BREAST BIOPSY Right 08/09/2023   [place] RT BREAST BX W LOC DEV EA ADD LESION IMG BX SPEC US GUIDE 08/09/2023 [facility]   LAMINECTOMY     2007, 2011   PORTACATH PLACEMENT Right 08/14/2023    Procedure: INSERTION PORT-A-CATH WITH ULTRASOUND GUIDEANCE;  Surgeon: Almond Lint, MD;  Location: [facility] OR;  Service: General;  Laterality: Right;   VAGINAL DELIVERY  2004, 2006    SOCIAL HISTORY: Social History   Socioeconomic History   Marital status: Divorced    Spouse name: Not on file   Number of children: 2   Years of education: Not on file   Highest education level: Not on file  Occupational History   Not on file  Tobacco Use   Smoking status: Former    Current packs/day: 0.00    Average packs/day: 0.5 packs/day for 30.0 years (15.0 ttl pk-yrs)    Types: Cigarettes    Start date: 04/07/1991    Quit date: 04/06/2021    Years since quitting: 2.3   Smokeless tobacco: Current  Vaping Use   Vaping status: Every Day   Substances: Nicotine  Substance and Sexual Activity   Alcohol use: Yes    Comment: 3-4 drinks a year   Drug use: No   Sexual activity: Not Currently    Birth control/protection: None  Other Topics Concern   Not on file  Social History Narrative   22 and 10 y/o sons   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   Social Determinants of Health   Financial Resource Strain: Not on file  Food Insecurity: Food Insecurity Present (07/27/2023)   Hunger Vital Sign    Worried About Running Out of Food in the Last Year: Sometimes true    Ran Out of Food in the Last Year: Sometimes true  Transportation Needs: No Transportation Needs (07/27/2023)   PRAPARE - Administrator, Civil Service (Medical): No    Lack of Transportation (Non-Medical): No  Physical Activity: Not on file  Stress: Not on file  Social Connections: Not on file  Intimate Partner Violence: Not At Risk (07/27/2023)   Humiliation, Afraid, Rape, and Kick questionnaire    Fear of Current or Ex-Partner: No    Emotionally Abused: No    Physically Abused: No    Sexually Abused: No    FAMILY HISTORY: Family History  Problem Relation Age of Onset   Breast cancer Mother 48   Colon cancer Mother 67    Asthma Mother    Hypertension Mother    Hearing loss Mother    Hypertension Father    Diabetes Father    Hyperlipidemia Father    Learning disabilities Son    ADD / ADHD Son    Ovarian cancer Maternal Grandmother    Hypertension Maternal Grandfather    Hyperlipidemia Maternal Grandfather    Heart disease Maternal Grandfather    Stroke Maternal Grandfather    Mental illness Paternal Grandmother    Hypertension Paternal Grandfather     Review of Systems  Constitutional:  Positive for fatigue. Negative for appetite change, chills, fever and unexpected  weight change.  HENT:   Negative for hearing loss, lump/mass and trouble swallowing.   Eyes:  Negative for eye problems and icterus.  Respiratory:  Negative for chest tightness, cough and shortness of breath.   Cardiovascular:  Negative for chest pain, leg swelling and palpitations.  Gastrointestinal:  Negative for abdominal distention, abdominal pain, constipation, diarrhea, nausea and vomiting.  Endocrine: Negative for hot flashes.  Genitourinary:  Negative for difficulty urinating.   Musculoskeletal:  Negative for arthralgias.  Skin:  Negative for itching and rash.  Neurological:  Negative for dizziness, extremity weakness, headaches and numbness.  Hematological:  Negative for adenopathy. Does not bruise/bleed easily.  Psychiatric/Behavioral:  Negative for depression. The patient is not nervous/anxious.       PHYSICAL EXAMINATION   Onc Performance Status - 08/21/23 1300       KPS SCALE   KPS % SCORE Normal, no compliants, no evidence of disease             Vitals:   08/21/23 1350  BP: 121/71  Pulse: 79  Resp: 18  Temp: 97.7 F (36.5 C)  SpO2: 100%    Physical Exam Constitutional:      General: She is not in acute distress.    Appearance: Normal appearance. She is not toxic-appearing.  HENT:     Head: Normocephalic and atraumatic.     Mouth/Throat:     Mouth: Mucous membranes are moist.     Pharynx:  Oropharynx is clear. No oropharyngeal exudate or posterior oropharyngeal erythema.  Eyes:     General: No scleral icterus. Cardiovascular:     Rate and Rhythm: Normal rate and regular rhythm.     Pulses: Normal pulses.     Heart sounds: Normal heart sounds.  Pulmonary:     Effort: Pulmonary effort is normal.     Breath sounds: Normal breath sounds.  Abdominal:     General: Abdomen is flat. Bowel sounds are normal. There is no distension.     Palpations: Abdomen is soft.     Tenderness: There is no abdominal tenderness.  Musculoskeletal:        General: No swelling.     Cervical back: Neck supple.  Lymphadenopathy:     Cervical: No cervical adenopathy.  Skin:    General: Skin is warm and dry.     Findings: No rash.  Neurological:     General: No focal deficit present.     Mental Status: She is alert.  Psychiatric:        Mood and Affect: Mood normal.        Behavior: Behavior normal.     LABORATORY DATA:  CBC    Component Value Date/Time   WBC 1.0 (L) 08/21/2023 1336   WBC 7.2 08/17/2023 1115   RBC 3.92 08/21/2023 1336   HGB 11.7 (L) 08/21/2023 1336   HCT 35.4 (L) 08/21/2023 1336   PLT 186 08/21/2023 1336   MCV 90.3 08/21/2023 1336   MCH 29.8 08/21/2023 1336   MCHC 33.1 08/21/2023 1336   RDW 12.3 08/21/2023 1336   LYMPHSABS 0.7 08/21/2023 1336   MONOABS 0.1 08/21/2023 1336   EOSABS 0.1 08/21/2023 1336   BASOSABS 0.0 08/21/2023 1336    CMP     Component Value Date/Time   NA 135 08/21/2023 1336   K 4.1 08/21/2023 1336   CL 103 08/21/2023 1336   CO2 26 08/21/2023 1336   GLUCOSE 96 08/21/2023 1336   BUN 12 08/21/2023 1336   CREATININE 0.60  08/21/2023 1336   CREATININE 0.63 08/09/2020 1032   CALCIUM 9.4 08/21/2023 1336   PROT 7.0 08/21/2023 1336   ALBUMIN 4.2 08/21/2023 1336   AST 12 (L) 08/21/2023 1336   ALT 12 08/21/2023 1336   ALKPHOS 82 08/21/2023 1336   BILITOT 0.4 08/21/2023 1336   GFRNONAA >60 08/21/2023 1336     ASSESSMENT and THERAPY PLAN:    Malignant neoplasm of upper-inner quadrant of left female breast (HCC) 07/19/2023:Palpable left breast mass for 6 months.  Mammogram and ultrasound revealed large masses.   10:30 position: 4.3 cm with skin thickening and ulceration: Grade 2 IDC with high-grade DCIS ER 95%, PR 95%, Ki67 30%, HER2 2+ by IHC, FISH negative ratio 1.37;  5 o'clock position: 3.3 cm with calcifications spanning 6.7 cm, axilla negative, biopsy: Grade 3 IDC with necrosis ER 95% PR 90% Ki-67 60% HER2 1+ negative   CT CAP 08/03/2023: 2 prominent left breast masses, left axillary lymph nodes, right hepatic lobe lesion concerning for metastatic breast cancer Ultrasound a liver biopsy scheduled for 08/17/2023  Treatment plan: Neoadjuvant chemotherapy with dose Adriamycin and Cytoxan followed by Taxol Mastectomy left breast Adjuvant radiation Antiestrogen therapy with CDK inhibitors -------------------------------------------------------------------------------------------------------------------------------------- Current treatment: Cycle 1 day 8 dose dense Adriamycin and Cytoxan  Constipation: managed with Senna S, resolved Fatigue: recommended energy conservation Oligometastasis: I reviewed her liver metastases and how it was a single met.  I reviewed that it is reasonable to continue with the current treatment plan as many patients who have a single metastatic site can still do quite well going through definitive chemotherapy and treatment plan like this.  Ann Daugherty was tearful about this news.   Neutropenia:  Her ANC is 0.2.  I reviewed neutropenic precautions with her in detail.  She verbalized understanding.    RTC in 1 week for labs, f/u, and her second cycle of Adriamycin/Cytoxan.   All questions were answered. The patient knows to call the clinic with any problems, questions or concerns. We can certainly see the patient much sooner if necessary.  Total encounter time:45 minutes*in face-to-face visit time, chart  review, lab review, care coordination, order entry, and documentation of the encounter time.    Lillard Anes, NP 08/21/23 7:23 PM Medical Oncology and Hematology [facility] [address] [place], [place] [address] Tel. [phone]    Fax. [phone]  *Total Encounter Time as defined by the Centers for Medicare and Medicaid Services includes, in addition to the face-to-face time of a patient visit (documented in the note above) non-face-to-face time: obtaining and reviewing outside history, ordering and reviewing medications, tests or procedures, care coordination (communications with other health care professionals or caregivers) and documentation in the medical record.

## 2023-08-21 NOTE — Telephone Encounter (Signed)
Critical lab Anc at 0.2

## 2023-08-21 NOTE — Assessment & Plan Note (Signed)
07/19/2023:Palpable left breast mass for 6 months.  Mammogram and ultrasound revealed large masses.   10:30 position: 4.3 cm with skin thickening and ulceration: Grade 2 IDC with high-grade DCIS ER 95%, PR 95%, Ki67 30%, HER2 2+ by IHC, FISH negative ratio 1.37;  5 o'clock position: 3.3 cm with calcifications spanning 6.7 cm, axilla negative, biopsy: Grade 3 IDC with necrosis ER 95% PR 90% Ki-67 60% HER2 1+ negative   CT CAP 08/03/2023: 2 prominent left breast masses, left axillary lymph nodes, right hepatic lobe lesion concerning for metastatic breast cancer Ultrasound a liver biopsy scheduled for 08/17/2023  Treatment plan: Neoadjuvant chemotherapy with dose Adriamycin and Cytoxan followed by Taxol Mastectomy left breast Adjuvant radiation Antiestrogen therapy with CDK inhibitors -------------------------------------------------------------------------------------------------------------------------------------- Current treatment: Cycle 1 day 8 dose dense Adriamycin and Cytoxan  Constipation: managed with Senna S, resolved Fatigue: recommended energy conservation Oligometastasis: I reviewed her liver metastases and how it was a single met.  I reviewed that it is reasonable to continue with the current treatment plan as many patients who have a single metastatic site can still do quite well going through definitive chemotherapy and treatment plan like this.  Ann Daugherty was tearful about this news.   Neutropenia:  Her ANC is 0.2.  I reviewed neutropenic precautions with her in detail.  She verbalized understanding.    RTC in 1 week for labs, f/u, and her second cycle of Adriamycin/Cytoxan.

## 2023-08-22 LAB — SURGICAL PATHOLOGY

## 2023-08-27 ENCOUNTER — Encounter: Payer: Self-pay | Admitting: Hematology and Oncology

## 2023-08-27 NOTE — Progress Notes (Signed)
Received voicemail from patient while out on PAL after receiving my card per my request.  Introduced myself as Dance movement psychotherapist. Patient has financial concerns and would like to apply for assistance. Discussed one-time $1000 [employer] grant to assist with personal household expenses while going through treatment, Advised what is needed to apply. She will bring on 10/23 at next appointment and give to registration staff and advised she will be given grant paperwork to complete. Advised to review information in folder that will be given and call me at earliest convenience to discuss in detail. She verbalized understanding.   She has my card for any additional financial questions or concerns.

## 2023-08-28 MED FILL — Fosaprepitant Dimeglumine For IV Infusion 150 MG (Base Eq): INTRAVENOUS | Qty: 5 | Status: AC

## 2023-08-28 NOTE — Progress Notes (Unsigned)
Patient Care Team: [name], [name] as PCP - General (Physician Assistant) Obgyn, Ma Hillock as Consulting Physician (Obstetrics and Gynecology) Donalee Citrin, MD as Consulting Physician (Neurosurgery) Donnelly Angelica, RN as Oncology Nurse Navigator Pershing Proud, RN as Oncology Nurse Navigator Serena Croissant, MD as Consulting Physician (Hematology and Oncology) Almond Lint, MD as Consulting Physician (General Surgery) Lonie Peak, MD as Attending Physician (Radiation Oncology)   CHIEF COMPLAINT: Follow up left breast cancer   Oncology History  Malignant neoplasm of upper-inner quadrant of left female breast (HCC)  07/19/2023 Initial Diagnosis   Palpable left breast mass for 6 months.  Mammogram and ultrasound revealed large masses.  10:30 position: 4.3 cm with skin thickening and ulceration: Grade 2 IDC with high-grade DCIS ER 95%, PR 95%, Ki67 30%, HER2 2+ by IHC, FISH negative ratio 1.37; 5 o'clock position: 3.3 cm with calcifications spanning 6.7 cm, axilla negative, biopsy: Grade 3 IDC with necrosis ER 95% PR 90% Ki-67 60% HER2 1+ negative   07/26/2023 Cancer Staging   Staging form: Breast, AJCC 8th Edition - Clinical: Stage IIIB (cT4b, cN0, cM0, G3, ER+, PR+, HER2-) - Signed by Serena Croissant, MD on 07/26/2023 Histologic grading system: 3 grade system   08/15/2023 -  Chemotherapy   Patient is on Treatment Plan : BREAST ADJUVANT DOSE DENSE AC q14d / PACLitaxel q7d     08/17/2023 Initial Biopsy   Liver biopsy: consistent with metastatic carcinoma, breast primary.     08/21/2023 Cancer Staging   Staging form: Breast, AJCC 8th Edition - Pathologic: Stage IV (pM1) - Signed by Loa Socks, NP on 08/21/2023      CURRENT THERAPY: Neoadjuvant AC-T starting 08/15/23  INTERVAL HISTORY Ms. Benett returns for follow up as scheduled. Began chemo 10/9, udenyca on 10/11, and toxicity check on 10/15. She did well with chemo and GCSF. Tolerated oral ice during adria and  avoided mucositis. Appetite has decreased some but still eating/drinking. Constipation from chemo improved with senokot but eventually used glycerin suppository. Stools loose now. She developed abdominal cramps and started her period on 10/14 - 10/19. She had one episode of severe cramping which lead to vomiting which has happened in the past. She has felt great the past few days. Left lower breast mass and upper/inner breast wound are smaller. She previously felt a left "mammary" node that she can't feel anymore.     ROS  All other systems reviewed and negative   Past Medical History:  Diagnosis Date   ADD (attention deficit disorder) 2015   Does not take any medication   Anxiety    Cancer (HCC)    breast   Depression    GERD (gastroesophageal reflux disease)    History of chicken pox    PONV (postoperative nausea and vomiting)      Past Surgical History:  Procedure Laterality Date   BACK SURGERY     BREAST BIOPSY Left 07/19/2023   [place] LT BREAST BX W LOC DEV EA ADD LESION IMG BX SPEC US GUIDE 07/19/2023 [facility]   BREAST BIOPSY Left 07/19/2023   [place] LT BREAST BX W LOC DEV 1ST LESION IMG BX SPEC US GUIDE 07/19/2023 [facility]   BREAST BIOPSY Right 08/09/2023   [place] RT BREAST BX W LOC DEV 1ST LESION IMG BX SPEC US GUIDE 08/09/2023 [facility]   BREAST BIOPSY Right 08/09/2023   [place] RT BREAST BX W LOC DEV EA ADD LESION IMG BX SPEC US GUIDE 08/09/2023 [facility]  LAMINECTOMY     2007, 2011   PORTACATH PLACEMENT Right 08/14/2023   Procedure: INSERTION PORT-A-CATH WITH ULTRASOUND Julian Reil;  Surgeon: Almond Lint, MD;  Location: [facility] OR;  Service: General;  Laterality: Right;   VAGINAL DELIVERY  2004, 2006     Outpatient Encounter Medications as of 08/29/2023  Medication Sig   acetaminophen (TYLENOL) 500 MG tablet Take 500 mg by mouth every 6 (six) hours as needed for mild pain.   Cholecalciferol (VITAMIN D3) 5000 units CAPS Take 5,000 Units by mouth daily.    citalopram (CELEXA) 40 MG tablet Take 0.5 tablets (20 mg total) by mouth daily.   dexamethasone (DECADRON) 4 MG tablet Take 1 tablet by mouth the day after and 1 tablet 2 days after chemo   Ibuprofen 200 MG CAPS Take 400 mg by mouth daily as needed (Pain).   lidocaine-prilocaine (EMLA) cream Apply to affected area once   loratadine (CLARITIN) 10 MG tablet Take 10 mg by mouth daily. Liquid gel   LORazepam (ATIVAN) 0.5 MG tablet Take 1 tablet (0.5 mg total) by mouth 2 (two) times daily as needed for anxiety.   ondansetron (ZOFRAN) 8 MG tablet Take 1 tablet (8 mg total) by mouth every 8 (eight) hours as needed for nausea/vomiting. Start third day after doxorubicin/cyclophosphamide chemotherapy.   prochlorperazine (COMPAZINE) 10 MG tablet Take 1 tablet (10 mg total) by mouth every 6 (six) hours as needed for nausea or vomiting.   sennosides-docusate sodium (SENOKOT-S) 8.6-50 MG tablet Take 2 tablets by mouth daily.   No facility-administered encounter medications on file as of 08/29/2023.     Today's Vitals   08/29/23 0826 08/29/23 0828  BP: 126/65   Pulse: 85   Resp: 20   Temp: 98.1 F (36.7 C)   SpO2: 99%   Weight: 147 lb 3.2 oz (66.8 kg)   PainSc:  0-No pain   Body mass index is 23.76 kg/m.   PHYSICAL EXAM GENERAL:alert, no distress and comfortable SKIN: no rash  EYES: sclera clear NECK: without mass LYMPH:  no palpable left axillary lymphadenopathy  LUNGS: clear with normal breathing effort HEART: regular rate & rhythm, no lower extremity edema ABDOMEN: abdomen soft, non-tender and normal bowel sounds NEURO: alert & oriented x 3 with fluent speech, no focal motor/sensory deficits Breast exam: deferred. Dressing to left upper inner breast c/d/i PAC without erythema    CBC    Component Value Date/Time   WBC 11.5 (H) 08/29/2023 0800   WBC 7.2 08/17/2023 1115   RBC 3.57 (L) 08/29/2023 0800   HGB 10.7 (L) 08/29/2023 0800   HCT 32.4 (L) 08/29/2023 0800   PLT 347 08/29/2023  0800   MCV 90.8 08/29/2023 0800   MCH 30.0 08/29/2023 0800   MCHC 33.0 08/29/2023 0800   RDW 12.8 08/29/2023 0800   LYMPHSABS 1.8 08/29/2023 0800   MONOABS 1.1 (H) 08/29/2023 0800   EOSABS 0.0 08/29/2023 0800   BASOSABS 0.1 08/29/2023 0800     CMP     Component Value Date/Time   NA 135 08/29/2023 0800   K 3.9 08/29/2023 0800   CL 102 08/29/2023 0800   CO2 27 08/29/2023 0800   GLUCOSE 105 (H) 08/29/2023 0800   BUN 14 08/29/2023 0800   CREATININE 0.65 08/29/2023 0800   CREATININE 0.63 08/09/2020 1032   CALCIUM 9.1 08/29/2023 0800   PROT 6.6 08/29/2023 0800   ALBUMIN 4.0 08/29/2023 0800   AST 12 (L) 08/29/2023 0800   ALT 10 08/29/2023 0800   ALKPHOS  95 08/29/2023 0800   BILITOT 0.2 (L) 08/29/2023 0800   GFRNONAA >60 08/29/2023 0800     ASSESSMENT & PLAN: 52 yo female  Malignant neoplasm of upper-inner quadrant of left female breast, ER/PR + HER2 - stage IV, oligo liver metastasis  07/19/2023:Palpable left breast mass for 6 months.  Mammogram and ultrasound revealed large masses.   10:30 position: 4.3 cm with skin thickening and ulceration: Grade 2 IDC with high-grade DCIS ER 95%, PR 95%, Ki67 30%, HER2 2+ by IHC, FISH negative ratio 1.37;  5 o'clock position: 3.3 cm with calcifications spanning 6.7 cm, axilla negative, biopsy: Grade 3 IDC with necrosis ER 95% PR 90% Ki-67 60% HER2 1+ negative CT CAP 08/03/2023: 2 prominent left breast masses, left axillary lymph nodes, right hepatic lobe lesion concerning for metastatic breast cancer Liver biopsy 08/17/2023 Confirmed metastatic breast cancer, oligometastasis. This has been reviewed by Lillard Anes NP and we discussed it again today Treatment goal remains curative with multimodal approach. Continue neoadjuvant chemo   Dispo: -Ms. Maslowski appears stable. S/p C 1 neoadjuvant AC with GCSF, she tolerated well overall. SEs well managed with supportive care at home. Able to recover and function well.  -She reports left breast masses  are smaller c/w clinical response to chemo -CBC/CMP reviewed, adequate for treatment -Proceed with C2 AC today as scheduled, GCSF on day 3; will see dietician today. I encouraged her to increase caloric intake with nutrition supplements  -F/up and C3 in 2 weeks    All questions were answered. The patient knows to call the clinic with any problems, questions or concerns. No barriers to learning were detected. I spent 20 minutes counseling the patient face to face. The total time spent in the appointment was 30 minutes and more than 50% was on counseling, review of test results, and coordination of care.   Santiago Glad, NP-C 08/29/2023

## 2023-08-29 ENCOUNTER — Inpatient Hospital Stay: Payer: Medicaid Other | Admitting: Dietician

## 2023-08-29 ENCOUNTER — Inpatient Hospital Stay (HOSPITAL_BASED_OUTPATIENT_CLINIC_OR_DEPARTMENT_OTHER): Payer: Medicaid Other | Admitting: Nurse Practitioner

## 2023-08-29 ENCOUNTER — Inpatient Hospital Stay: Payer: Medicaid Other

## 2023-08-29 ENCOUNTER — Encounter: Payer: Self-pay | Admitting: Nurse Practitioner

## 2023-08-29 ENCOUNTER — Other Ambulatory Visit: Payer: Self-pay

## 2023-08-29 DIAGNOSIS — C50212 Malignant neoplasm of upper-inner quadrant of left female breast: Secondary | ICD-10-CM

## 2023-08-29 DIAGNOSIS — Z5111 Encounter for antineoplastic chemotherapy: Secondary | ICD-10-CM | POA: Diagnosis not present

## 2023-08-29 LAB — CBC WITH DIFFERENTIAL (CANCER CENTER ONLY)
Abs Immature Granulocytes: 0.88 10*3/uL — ABNORMAL HIGH (ref 0.00–0.07)
Basophils Absolute: 0.1 10*3/uL (ref 0.0–0.1)
Basophils Relative: 1 %
Eosinophils Absolute: 0 10*3/uL (ref 0.0–0.5)
Eosinophils Relative: 0 %
HCT: 32.4 % — ABNORMAL LOW (ref 36.0–46.0)
Hemoglobin: 10.7 g/dL — ABNORMAL LOW (ref 12.0–15.0)
Immature Granulocytes: 8 %
Lymphocytes Relative: 16 %
Lymphs Abs: 1.8 10*3/uL (ref 0.7–4.0)
MCH: 30 pg (ref 26.0–34.0)
MCHC: 33 g/dL (ref 30.0–36.0)
MCV: 90.8 fL (ref 80.0–100.0)
Monocytes Absolute: 1.1 10*3/uL — ABNORMAL HIGH (ref 0.1–1.0)
Monocytes Relative: 9 %
Neutro Abs: 7.6 10*3/uL (ref 1.7–7.7)
Neutrophils Relative %: 66 %
Platelet Count: 347 10*3/uL (ref 150–400)
RBC: 3.57 MIL/uL — ABNORMAL LOW (ref 3.87–5.11)
RDW: 12.8 % (ref 11.5–15.5)
WBC Count: 11.5 10*3/uL — ABNORMAL HIGH (ref 4.0–10.5)
nRBC: 0.3 % — ABNORMAL HIGH (ref 0.0–0.2)

## 2023-08-29 LAB — CMP (CANCER CENTER ONLY)
ALT: 10 U/L (ref 0–44)
AST: 12 U/L — ABNORMAL LOW (ref 15–41)
Albumin: 4 g/dL (ref 3.5–5.0)
Alkaline Phosphatase: 95 U/L (ref 38–126)
Anion gap: 6 (ref 5–15)
BUN: 14 mg/dL (ref 6–20)
CO2: 27 mmol/L (ref 22–32)
Calcium: 9.1 mg/dL (ref 8.9–10.3)
Chloride: 102 mmol/L (ref 98–111)
Creatinine: 0.65 mg/dL (ref 0.44–1.00)
GFR, Estimated: >60 mL/min (ref >60)
Glucose, Bld: 105 mg/dL — ABNORMAL HIGH (ref 70–99)
Potassium: 3.9 mmol/L (ref 3.5–5.1)
Sodium: 135 mmol/L (ref 135–145)
Total Bilirubin: 0.2 mg/dL — ABNORMAL LOW (ref 0.3–1.2)
Total Protein: 6.6 g/dL (ref 6.5–8.1)

## 2023-08-29 LAB — PREGNANCY, URINE: Preg Test, Ur: NEGATIVE

## 2023-08-29 MED ORDER — HEPARIN SOD (PORK) LOCK FLUSH 100 UNIT/ML IV SOLN
500.0000 [IU] | Freq: Once | INTRAVENOUS | Status: AC | PRN
  Administered 2023-08-29: 500 [IU]

## 2023-08-29 MED ORDER — SODIUM CHLORIDE 0.9% FLUSH
10.0000 mL | INTRAVENOUS | Status: DC | PRN
  Administered 2023-08-29: 10 mL

## 2023-08-29 MED ORDER — PALONOSETRON HCL INJECTION 0.25 MG/5ML
0.2500 mg | Freq: Once | INTRAVENOUS | Status: AC
  Administered 2023-08-29: 0.25 mg via INTRAVENOUS
  Filled 2023-08-29: qty 5

## 2023-08-29 MED ORDER — DOXORUBICIN HCL CHEMO IV INJECTION 2 MG/ML
60.0000 mg/m2 | Freq: Once | INTRAVENOUS | Status: AC
  Administered 2023-08-29: 108 mg via INTRAVENOUS
  Filled 2023-08-29: qty 54

## 2023-08-29 MED ORDER — DEXAMETHASONE SODIUM PHOSPHATE 10 MG/ML IJ SOLN
10.0000 mg | Freq: Once | INTRAMUSCULAR | Status: AC
  Administered 2023-08-29: 10 mg via INTRAVENOUS
  Filled 2023-08-29: qty 1

## 2023-08-29 MED ORDER — SODIUM CHLORIDE 0.9 % IV SOLN: Freq: Once | INTRAVENOUS | Status: AC

## 2023-08-29 MED ORDER — SODIUM CHLORIDE 0.9 % IV SOLN
600.0000 mg/m2 | Freq: Once | INTRAVENOUS | Status: AC
  Administered 2023-08-29: 1000 mg via INTRAVENOUS
  Filled 2023-08-29: qty 50

## 2023-08-29 MED ORDER — SODIUM CHLORIDE 0.9 % IV SOLN
150.0000 mg | Freq: Once | INTRAVENOUS | Status: AC
  Administered 2023-08-29: 150 mg via INTRAVENOUS
  Filled 2023-08-29: qty 150

## 2023-08-30 ENCOUNTER — Inpatient Hospital Stay: Payer: Medicaid Other

## 2023-08-30 NOTE — Progress Notes (Signed)
[facility] CSW Progress Note  Visual merchandiser contacted patient by phone to discuss emotional adjustment to treatment starting and [facility] [employer]. Patient described symptoms she has experienced and described ways in which she has coped and managed symptoms. Patient described increased  support by friends and positive adjustment with her son. CSW informed patient of the [facility], patient declined participating wanting her "spot" to go towards other families in need. CSW explained that primary social worker M. Kirtland Bouchard will return next week and she may be POC going forward, patient verbalized understanding. No follow up scheduled at this time.  Marguerita Merles, LCSWA Clinical Social Worker [facility]

## 2023-08-31 ENCOUNTER — Inpatient Hospital Stay: Payer: Medicaid Other

## 2023-08-31 ENCOUNTER — Other Ambulatory Visit: Payer: Self-pay

## 2023-08-31 VITALS — BP 128/66 | HR 81 | Resp 19

## 2023-08-31 DIAGNOSIS — Z5111 Encounter for antineoplastic chemotherapy: Secondary | ICD-10-CM | POA: Diagnosis not present

## 2023-08-31 DIAGNOSIS — C50212 Malignant neoplasm of upper-inner quadrant of left female breast: Secondary | ICD-10-CM

## 2023-08-31 MED ORDER — PEGFILGRASTIM-FPGK 6 MG/0.6ML ~~LOC~~ SOSY
6.0000 mg | PREFILLED_SYRINGE | Freq: Once | SUBCUTANEOUS | Status: AC
  Administered 2023-08-31: 6 mg via SUBCUTANEOUS
  Filled 2023-08-31: qty 0.6

## 2023-08-31 NOTE — Patient Instructions (Signed)

## 2023-09-04 ENCOUNTER — Inpatient Hospital Stay (HOSPITAL_BASED_OUTPATIENT_CLINIC_OR_DEPARTMENT_OTHER): Payer: Medicaid Other | Admitting: Hematology and Oncology

## 2023-09-04 VITALS — BP 113/70 | HR 107 | Temp 97.8°F | Resp 18 | Ht 66.0 in | Wt 143.8 lb

## 2023-09-04 DIAGNOSIS — C50212 Malignant neoplasm of upper-inner quadrant of left female breast: Secondary | ICD-10-CM

## 2023-09-04 DIAGNOSIS — Z1212 Encounter for screening for malignant neoplasm of rectum: Secondary | ICD-10-CM | POA: Diagnosis not present

## 2023-09-04 DIAGNOSIS — Z1211 Encounter for screening for malignant neoplasm of colon: Secondary | ICD-10-CM | POA: Diagnosis not present

## 2023-09-04 DIAGNOSIS — Z5111 Encounter for antineoplastic chemotherapy: Secondary | ICD-10-CM | POA: Diagnosis not present

## 2023-09-04 NOTE — Assessment & Plan Note (Signed)
07/19/2023:Palpable left breast mass for 6 months.  Mammogram and ultrasound revealed large masses.   10:30 position: 4.3 cm with skin thickening and ulceration: Grade 2 IDC with high-grade DCIS ER 95%, PR 95%, Ki67 30%, HER2 2+ by IHC, FISH negative ratio 1.37;  5 o'clock position: 3.3 cm with calcifications spanning 6.7 cm, axilla negative, biopsy: Grade 3 IDC with necrosis ER 95% PR 90% Ki-67 60% HER2 1+ negative   CT CAP 08/03/2023: 2 prominent left breast masses, left axillary lymph nodes, right hepatic lobe lesion concerning for metastatic breast cancer Ultrasound a liver biopsy scheduled for 08/17/2023   Treatment plan: Neoadjuvant chemotherapy with dose Adriamycin and Cytoxan followed by Taxol Mastectomy left breast Adjuvant radiation Antiestrogen therapy with CDK inhibitors -------------------------------------------------------------------------------------------------------------------------------------- Current treatment: Completed 2 cycles of dose dense Adriamycin and Cytoxan   Liver biopsy  08/17/2023.  Metastatic breast cancer ER 90%, PR 90%, Ki-67 45%, HER2 0 The reason for using definitive chemotherapy is because we believe Ann Daugherty has solitary liver metastasis.  I recommend continuation of current therapy

## 2023-09-04 NOTE — Progress Notes (Signed)
Patient Care Team: [name], [name] as PCP - General (Physician Assistant) Obgyn, Ma Hillock as Consulting Physician (Obstetrics and Gynecology) Donalee Citrin, MD as Consulting Physician (Neurosurgery) Donnelly Angelica, RN as Oncology Nurse Navigator Pershing Proud, RN as Oncology Nurse Navigator Serena Croissant, MD as Consulting Physician (Hematology and Oncology) Almond Lint, MD as Consulting Physician (General Surgery) Lonie Peak, MD as Attending Physician (Radiation Oncology)  DIAGNOSIS:  Encounter Diagnosis  Name Primary?   Malignant neoplasm of upper-inner quadrant of left female breast, unspecified estrogen receptor status (HCC) Yes    SUMMARY OF ONCOLOGIC HISTORY: Oncology History  Malignant neoplasm of upper-inner quadrant of left female breast (HCC)  07/19/2023 Initial Diagnosis   Palpable left breast mass for 6 months.  Mammogram and ultrasound revealed large masses.  10:30 position: 4.3 cm with skin thickening and ulceration: Grade 2 IDC with high-grade DCIS ER 95%, PR 95%, Ki67 30%, HER2 2+ by IHC, FISH negative ratio 1.37; 5 o'clock position: 3.3 cm with calcifications spanning 6.7 cm, axilla negative, biopsy: Grade 3 IDC with necrosis ER 95% PR 90% Ki-67 60% HER2 1+ negative   07/26/2023 Cancer Staging   Staging form: Breast, AJCC 8th Edition - Clinical: Stage IIIB (cT4b, cN0, cM0, G3, ER+, PR+, HER2-) - Signed by Serena Croissant, MD on 07/26/2023 Histologic grading system: 3 grade system   08/15/2023 -  Chemotherapy   Patient is on Treatment Plan : BREAST ADJUVANT DOSE DENSE AC q14d / PACLitaxel q7d     08/17/2023 Initial Biopsy   Liver biopsy: consistent with metastatic carcinoma, breast primary.     08/21/2023 Cancer Staging   Staging form: Breast, AJCC 8th Edition - Pathologic: Stage IV (pM1) - Signed by Loa Socks, NP on 08/21/2023     CHIEF COMPLIANT: Follow-up to discuss results of liver biopsy    History of Present Illness   Ann Daugherty,  a patient with breast cancer, has completed two cycles of chemotherapy. She reports hair loss, a common side effect of chemotherapy. Gastrointestinal symptoms include alternating constipation and diarrhea, but she does not report any mouth ulcers. She also mentions a loss of appetite and subsequent weight loss. After the first cycle of chemotherapy, she experienced a menstrual period, but does not expect any further menstruation. She had one episode of vomiting, which she attributes to cramps. She also mentions a liver biopsy that confirmed the doctor's suspicions about her condition. She is aware of a potential change in her surgical plan, but is reassured that her treatment plan remains the same. She also mentions a possible anemia and notes that her tumor has reduced in size.         ALLERGIES:  has No Known Allergies.  MEDICATIONS:  Current Outpatient Medications  Medication Sig Dispense Refill   acetaminophen (TYLENOL) 500 MG tablet Take 500 mg by mouth every 6 (six) hours as needed for mild pain.     Cholecalciferol (VITAMIN D3) 5000 units CAPS Take 5,000 Units by mouth daily.     citalopram (CELEXA) 40 MG tablet Take 0.5 tablets (20 mg total) by mouth daily.     dexamethasone (DECADRON) 4 MG tablet Take 1 tablet by mouth the day after and 1 tablet 2 days after chemo 8 tablet 0   Ibuprofen 200 MG CAPS Take 400 mg by mouth daily as needed (Pain).     lidocaine-prilocaine (EMLA) cream Apply to affected area once 30 g 3   loratadine (CLARITIN) 10 MG tablet Take 10 mg by mouth daily. Liquid gel  LORazepam (ATIVAN) 0.5 MG tablet Take 1 tablet (0.5 mg total) by mouth 2 (two) times daily as needed for anxiety. 30 tablet 1   ondansetron (ZOFRAN) 8 MG tablet Take 1 tablet (8 mg total) by mouth every 8 (eight) hours as needed for nausea/vomiting. Start third day after doxorubicin/cyclophosphamide chemotherapy. 30 tablet 1   prochlorperazine (COMPAZINE) 10 MG tablet Take 1 tablet (10 mg total) by  mouth every 6 (six) hours as needed for nausea or vomiting. 30 tablet 1   sennosides-docusate sodium (SENOKOT-S) 8.6-50 MG tablet Take 2 tablets by mouth daily.     No current facility-administered medications for this visit.    PHYSICAL EXAMINATION: ECOG PERFORMANCE STATUS: 1 - Symptomatic but completely ambulatory  Vitals:   09/04/23 1321  BP: 113/70  Pulse: (!) 107  Resp: 18  Temp: 97.8 F (36.6 C)  SpO2: 100%   Filed Weights   09/04/23 1321  Weight: 143 lb 12.8 oz (65.2 kg)    Physical Exam   BREAST: Palpable mass noted to be decreasing in size. SKIN: Alopecia noted.      (exam performed in the presence of a chaperone)  LABORATORY DATA:  I have reviewed the data as listed    Latest Ref Rng & Units 08/29/2023    8:00 AM 08/21/2023    1:36 PM 08/15/2023   11:11 AM  CMP  Glucose 70 - 99 mg/dL 528  96  413   BUN 6 - 20 mg/dL 14  12  8    Creatinine 0.44 - 1.00 mg/dL 2.44  0.10  2.72   Sodium 135 - 145 mmol/L 135  135  137   Potassium 3.5 - 5.1 mmol/L 3.9  4.1  3.1   Chloride 98 - 111 mmol/L 102  103  105   CO2 22 - 32 mmol/L 27  26  26    Calcium 8.9 - 10.3 mg/dL 9.1  9.4  9.4   Total Protein 6.5 - 8.1 g/dL 6.6  7.0  6.9   Total Bilirubin 0.3 - 1.2 mg/dL 0.2  0.4  0.3   Alkaline Phos 38 - 126 U/L 95  82  62   AST 15 - 41 U/L 12  12  15    ALT 0 - 44 U/L 10  12  14      Lab Results  Component Value Date   WBC 11.5 (H) 08/29/2023   HGB 10.7 (L) 08/29/2023   HCT 32.4 (L) 08/29/2023   MCV 90.8 08/29/2023   PLT 347 08/29/2023   NEUTROABS 7.6 08/29/2023    ASSESSMENT & PLAN:  Malignant neoplasm of upper-inner quadrant of left female breast (HCC) 07/19/2023:Palpable left breast mass for 6 months.  Mammogram and ultrasound revealed large masses.   10:30 position: 4.3 cm with skin thickening and ulceration: Grade 2 IDC with high-grade DCIS ER 95%, PR 95%, Ki67 30%, HER2 2+ by IHC, FISH negative ratio 1.37;  5 o'clock position: 3.3 cm with calcifications spanning 6.7  cm, axilla negative, biopsy: Grade 3 IDC with necrosis ER 95% PR 90% Ki-67 60% HER2 1+ negative   CT CAP 08/03/2023: 2 prominent left breast masses, left axillary lymph nodes, right hepatic lobe lesion concerning for metastatic breast cancer Ultrasound a liver biopsy scheduled for 08/17/2023   Treatment plan: Neoadjuvant chemotherapy with dose Adriamycin and Cytoxan followed by Taxol Mastectomy left breast Adjuvant radiation Antiestrogen therapy with CDK inhibitors -------------------------------------------------------------------------------------------------------------------------------------- Current treatment: Completed 2 cycles of dose dense Adriamycin and Cytoxan   Liver biopsy  08/17/2023.  Metastatic breast cancer ER 90%, PR 90%, Ki-67 45%, HER2 0 The reason for using definitive chemotherapy is because we believe she has solitary liver metastasis.  I recommend continuation of current therapy ------------------------------------- Assessment and Plan    Metastatic Breast Cancer Undergoing chemotherapy with noted side effects including hair loss, alternating constipation and diarrhea, and loss of appetite. No nausea or mouth ulcers reported. Noted improvement in size of palpable mass. Liver biopsy confirmed metastasis but does not change treatment plan. -Continue current chemotherapy regimen. -Monitor for anemia and adjust treatment as necessary. -Continue to monitor response to treatment with physical examination and symptom management.  General Health Maintenance -Next treatment scheduled for one week from today.          No orders of the defined types were placed in this encounter.  The patient has a good understanding of the overall plan. she agrees with it. she will call with any problems that may develop before the next visit here. Total time spent: 30 mins including face to face time and time spent for planning, charting and co-ordination of care   Tamsen Meek,  MD 09/04/23

## 2023-09-07 DIAGNOSIS — Z419 Encounter for procedure for purposes other than remedying health state, unspecified: Secondary | ICD-10-CM | POA: Diagnosis not present

## 2023-09-11 LAB — COLOGUARD: COLOGUARD: NEGATIVE

## 2023-09-11 MED FILL — Fosaprepitant Dimeglumine For IV Infusion 150 MG (Base Eq): INTRAVENOUS | Qty: 5 | Status: AC

## 2023-09-12 ENCOUNTER — Inpatient Hospital Stay (HOSPITAL_BASED_OUTPATIENT_CLINIC_OR_DEPARTMENT_OTHER): Payer: Medicaid Other | Admitting: Hematology and Oncology

## 2023-09-12 ENCOUNTER — Inpatient Hospital Stay: Payer: Medicaid Other | Attending: Hematology and Oncology

## 2023-09-12 ENCOUNTER — Inpatient Hospital Stay: Payer: Medicaid Other

## 2023-09-12 VITALS — BP 114/58 | HR 81 | Temp 97.2°F | Resp 18 | Ht 66.0 in | Wt 145.8 lb

## 2023-09-12 DIAGNOSIS — C50212 Malignant neoplasm of upper-inner quadrant of left female breast: Secondary | ICD-10-CM | POA: Diagnosis not present

## 2023-09-12 DIAGNOSIS — C787 Secondary malignant neoplasm of liver and intrahepatic bile duct: Secondary | ICD-10-CM | POA: Diagnosis not present

## 2023-09-12 DIAGNOSIS — Z5111 Encounter for antineoplastic chemotherapy: Secondary | ICD-10-CM | POA: Diagnosis present

## 2023-09-12 DIAGNOSIS — T451X5A Adverse effect of antineoplastic and immunosuppressive drugs, initial encounter: Secondary | ICD-10-CM | POA: Insufficient documentation

## 2023-09-12 DIAGNOSIS — Z170 Estrogen receptor positive status [ER+]: Secondary | ICD-10-CM | POA: Diagnosis not present

## 2023-09-12 DIAGNOSIS — D6481 Anemia due to antineoplastic chemotherapy: Secondary | ICD-10-CM | POA: Insufficient documentation

## 2023-09-12 DIAGNOSIS — Z95828 Presence of other vascular implants and grafts: Secondary | ICD-10-CM

## 2023-09-12 LAB — CMP (CANCER CENTER ONLY)
ALT: 17 U/L (ref 0–44)
AST: 15 U/L (ref 15–41)
Albumin: 4.1 g/dL (ref 3.5–5.0)
Alkaline Phosphatase: 100 U/L (ref 38–126)
Anion gap: 8 (ref 5–15)
BUN: 9 mg/dL (ref 6–20)
CO2: 25 mmol/L (ref 22–32)
Calcium: 8.9 mg/dL (ref 8.9–10.3)
Chloride: 104 mmol/L (ref 98–111)
Creatinine: 0.57 mg/dL (ref 0.44–1.00)
GFR, Estimated: >60 mL/min (ref >60)
Glucose, Bld: 118 mg/dL — ABNORMAL HIGH (ref 70–99)
Potassium: 3.6 mmol/L (ref 3.5–5.1)
Sodium: 137 mmol/L (ref 135–145)
Total Bilirubin: 0.3 mg/dL (ref <1.2)
Total Protein: 6.6 g/dL (ref 6.5–8.1)

## 2023-09-12 LAB — CBC WITH DIFFERENTIAL (CANCER CENTER ONLY)
Abs Immature Granulocytes: 1.36 10*3/uL — ABNORMAL HIGH (ref 0.00–0.07)
Basophils Absolute: 0 10*3/uL (ref 0.0–0.1)
Basophils Relative: 0 %
Eosinophils Absolute: 0 10*3/uL (ref 0.0–0.5)
Eosinophils Relative: 0 %
HCT: 31.8 % — ABNORMAL LOW (ref 36.0–46.0)
Hemoglobin: 10.4 g/dL — ABNORMAL LOW (ref 12.0–15.0)
Immature Granulocytes: 12 %
Lymphocytes Relative: 11 %
Lymphs Abs: 1.3 10*3/uL (ref 0.7–4.0)
MCH: 29.7 pg (ref 26.0–34.0)
MCHC: 32.7 g/dL (ref 30.0–36.0)
MCV: 90.9 fL (ref 80.0–100.0)
Monocytes Absolute: 1 10*3/uL (ref 0.1–1.0)
Monocytes Relative: 9 %
Neutro Abs: 8.1 10*3/uL — ABNORMAL HIGH (ref 1.7–7.7)
Neutrophils Relative %: 68 %
Platelet Count: 203 10*3/uL (ref 150–400)
RBC: 3.5 MIL/uL — ABNORMAL LOW (ref 3.87–5.11)
RDW: 14.6 % (ref 11.5–15.5)
Smear Review: NORMAL
WBC Count: 11.8 10*3/uL — ABNORMAL HIGH (ref 4.0–10.5)
nRBC: 0.2 % (ref 0.0–0.2)

## 2023-09-12 LAB — PREGNANCY, URINE: Preg Test, Ur: NEGATIVE

## 2023-09-12 MED ORDER — SODIUM CHLORIDE 0.9 % IV SOLN
150.0000 mg | Freq: Once | INTRAVENOUS | Status: AC
  Administered 2023-09-12: 150 mg via INTRAVENOUS
  Filled 2023-09-12: qty 150

## 2023-09-12 MED ORDER — DOXORUBICIN HCL CHEMO IV INJECTION 2 MG/ML
60.0000 mg/m2 | Freq: Once | INTRAVENOUS | Status: AC
  Administered 2023-09-12: 108 mg via INTRAVENOUS
  Filled 2023-09-12: qty 54

## 2023-09-12 MED ORDER — PALONOSETRON HCL INJECTION 0.25 MG/5ML
0.2500 mg | Freq: Once | INTRAVENOUS | Status: AC
Start: 2023-09-12 — End: 2023-09-12
  Administered 2023-09-12: 0.25 mg via INTRAVENOUS
  Filled 2023-09-12: qty 5

## 2023-09-12 MED ORDER — SODIUM CHLORIDE 0.9 % IV SOLN: Freq: Once | INTRAVENOUS | Status: AC

## 2023-09-12 MED ORDER — SODIUM CHLORIDE 0.9% FLUSH
10.0000 mL | Freq: Once | INTRAVENOUS | Status: AC
  Administered 2023-09-12: 10 mL

## 2023-09-12 MED ORDER — DEXAMETHASONE SODIUM PHOSPHATE 10 MG/ML IJ SOLN
10.0000 mg | Freq: Once | INTRAMUSCULAR | Status: AC
  Administered 2023-09-12: 10 mg via INTRAVENOUS
  Filled 2023-09-12: qty 1

## 2023-09-12 MED ORDER — SODIUM CHLORIDE 0.9 % IV SOLN
600.0000 mg/m2 | Freq: Once | INTRAVENOUS | Status: AC
  Administered 2023-09-12: 1000 mg via INTRAVENOUS
  Filled 2023-09-12: qty 50

## 2023-09-12 MED ORDER — SODIUM CHLORIDE 0.9% FLUSH
10.0000 mL | INTRAVENOUS | Status: DC | PRN
  Administered 2023-09-12: 10 mL

## 2023-09-12 MED ORDER — HEPARIN SOD (PORK) LOCK FLUSH 100 UNIT/ML IV SOLN
500.0000 [IU] | Freq: Once | INTRAVENOUS | Status: AC | PRN
  Administered 2023-09-12: 500 [IU]

## 2023-09-12 NOTE — Assessment & Plan Note (Addendum)
07/19/2023:Palpable left breast mass for 6 months.  Mammogram and ultrasound revealed large masses.   10:30 position: 4.3 cm with skin thickening and ulceration: Grade 2 IDC with high-grade DCIS ER 95%, PR 95%, Ki67 30%, HER2 2+ by IHC, FISH negative ratio 1.37;  5 o'clock position: 3.3 cm with calcifications spanning 6.7 cm, axilla negative, biopsy: Grade 3 IDC with necrosis ER 95% PR 90% Ki-67 60% HER2 1+ negative   CT CAP 08/03/2023: 2 prominent left breast masses, left axillary lymph nodes, right hepatic lobe lesion concerning for metastatic breast cancer Ultrasound a liver biopsy scheduled for 08/17/2023   Treatment plan: Neoadjuvant chemotherapy with dose Adriamycin and Cytoxan followed by Taxol Mastectomy left breast Adjuvant radiation Antiestrogen therapy with CDK inhibitors -------------------------------------------------------------------------------------------------------------------------------------- Current treatment: Cycle 3 of dose dense Adriamycin and Cytoxan   Liver biopsy  08/17/2023.  Metastatic breast cancer ER 90%, PR 90%, Ki-67 45%, HER2 0 The reason for using definitive chemotherapy is because she has solitary liver metastasis.  Chemo toxicities: Tongue changes Fatigue Chemo induced anemia  Return to clinic in 2 weeks for cycle 4

## 2023-09-12 NOTE — Research (Signed)
Trial:  S2205, ICE COMPRESS: RANDOMIZED TRIAL OF LIMB CRYOCOMPRESSION VERSUS CONTINUOUS COMPRESSION VERSUS LOW CYCLIC COMPRESSION FOR THE PREVENTION OF TAXANE-INDUCED PERIPHERAL NEUROPATHY [name] was identified by Dr Pamelia Hoit as a potential candidate for the above listed study.  This Clinical Research Nurse met with VENORA KAUTZMAN, [id number], on 09/12/23 in a manner and location that ensures patient privacy to discuss participation in the above listed research study.  Patient is Unaccompanied.  A copy of the informed consent document and separate HIPAA Authorization was provided to the patient.  Patient reads, speaks, and understands [place].   Patient was provided with the business card of this Nurse and encouraged to contact the research team with any questions.  Approximately 20 minutes were spent with the patient reviewing the informed consent documents.  Patient was provided the option of taking informed consent documents home to review and was encouraged to review at their convenience with their support network, including other care providers. Patient took the consent documents home to review.  Clarified the following with the patient/provider: Does the patient have a history of skin or limb metastases? No Has the patient previously received neurotoxic chemotherapy for any reason (e.g. taxanes, platinum agents, vinca alkaloids, or bortezomib)? No Does the patient have pre-existing clinical peripheral neuropathy from any cause? No Doest the patient have a history of: Raynaud's phenomenon? No Cold agglutinin disease? No Cryoglobulinemia? No Cryofibrinogenemia? No Post-traumatic cold dystrophy? No Peripheral arterial ischemia? No 5. Patient reads, speaks, and understands [place]  Plan made with patient for research to follow up via phone on 09/24/23, with plan for potential consent at her 09/26/23 or 09/28/23 visit.  Margret Chance Cayden Rautio, RN, BSN, [facility]  Her   Hers Clinical Research Nurse [facility] Direct Dial [phone]  Pager [phone] 09/12/2023 12:14 PM

## 2023-09-12 NOTE — Patient Instructions (Signed)
[facility] - A DEPT OF [facility]  Discharge Instructions: Thank you for choosing [facility] to provide your oncology and hematology care.   If you have a lab appointment with the Cancer Center, please go directly to the Cancer Center and check in at the registration area.   Wear comfortable clothing and clothing appropriate for easy access to any Portacath or PICC line.   We strive to give you quality time with your provider. You may need to reschedule your appointment if you arrive late (15 or more minutes).  Arriving late affects you and other patients whose appointments are after yours.  Also, if you miss three or more appointments without notifying the office, you may be dismissed from the clinic at the provider's discretion.      For prescription refill requests, have your pharmacy contact our office and allow 72 hours for refills to be completed.    Today you received the following chemotherapy and/or immunotherapy agents Adriamycin & Cytoxan      To help prevent nausea and vomiting after your treatment, we encourage you to take your nausea medication as directed.  BELOW ARE SYMPTOMS THAT SHOULD BE REPORTED IMMEDIATELY: *FEVER GREATER THAN 100.4 F (38 C) OR HIGHER *CHILLS OR SWEATING *NAUSEA AND VOMITING THAT IS NOT CONTROLLED WITH YOUR NAUSEA MEDICATION *UNUSUAL SHORTNESS OF BREATH *UNUSUAL BRUISING OR BLEEDING *URINARY PROBLEMS (pain or burning when urinating, or frequent urination) *BOWEL PROBLEMS (unusual diarrhea, constipation, pain near the anus) TENDERNESS IN MOUTH AND THROAT WITH OR WITHOUT PRESENCE OF ULCERS (sore throat, sores in mouth, or a toothache) UNUSUAL RASH, SWELLING OR PAIN  UNUSUAL VAGINAL DISCHARGE OR ITCHING   Items with * indicate a potential emergency and should be followed up as soon as possible or go to the Emergency Department if any problems should occur.  Please show the CHEMOTHERAPY ALERT CARD or  IMMUNOTHERAPY ALERT CARD at check-in to the Emergency Department and triage nurse.  Should you have questions after your visit or need to cancel or reschedule your appointment, please contact [facility] - A DEPT OF [facility]  Dept: [phone]  and follow the prompts.  Office hours are 8:00 a.m. to 4:30 p.m. Monday - Friday. Please note that voicemails left after 4:00 p.m. may not be returned until the following business day.  We are closed weekends and major holidays. You have access to a nurse at all times for urgent questions. Please call the main number to the clinic Dept: [phone] and follow the prompts.   For any non-urgent questions, you may also contact your provider using MyChart. We now offer e-Visits for anyone 66 and older to request care online for non-urgent symptoms. For details visit [email or web address].   Also download the MyChart app! Go to the app store, search "MyChart", open the app, select [place], and log in with your MyChart username and password.

## 2023-09-12 NOTE — Progress Notes (Signed)
Patient Care Team: [name], [name] as PCP - General (Physician Assistant) Obgyn, Ma Hillock as Consulting Physician (Obstetrics and Gynecology) Donalee Citrin, MD as Consulting Physician (Neurosurgery) Donnelly Angelica, RN as Oncology Nurse Navigator Pershing Proud, RN as Oncology Nurse Navigator Serena Croissant, MD as Consulting Physician (Hematology and Oncology) Almond Lint, MD as Consulting Physician (General Surgery) Lonie Peak, MD as Attending Physician (Radiation Oncology)  DIAGNOSIS:  Encounter Diagnoses  Name Primary?   Malignant neoplasm of upper-inner quadrant of left breast in female, estrogen receptor positive (HCC) Yes   Malignant neoplasm of upper-inner quadrant of left female breast, unspecified estrogen receptor status (HCC)     SUMMARY OF ONCOLOGIC HISTORY: Oncology History  Malignant neoplasm of upper-inner quadrant of left female breast (HCC)  07/19/2023 Initial Diagnosis   Palpable left breast mass for 6 months.  Mammogram and ultrasound revealed large masses.  10:30 position: 4.3 cm with skin thickening and ulceration: Grade 2 IDC with high-grade DCIS ER 95%, PR 95%, Ki67 30%, HER2 2+ by IHC, FISH negative ratio 1.37; 5 o'clock position: 3.3 cm with calcifications spanning 6.7 cm, axilla negative, biopsy: Grade 3 IDC with necrosis ER 95% PR 90% Ki-67 60% HER2 1+ negative   07/26/2023 Cancer Staging   Staging form: Breast, AJCC 8th Edition - Clinical: Stage IIIB (cT4b, cN0, cM0, G3, ER+, PR+, HER2-) - Signed by Serena Croissant, MD on 07/26/2023 Histologic grading system: 3 grade system   08/15/2023 -  Chemotherapy   Patient is on Treatment Plan : BREAST ADJUVANT DOSE DENSE AC q14d / PACLitaxel q7d     08/17/2023 Initial Biopsy   Liver biopsy: consistent with metastatic carcinoma, breast primary.     08/21/2023 Cancer Staging   Staging form: Breast, AJCC 8th Edition - Pathologic: Stage IV (pM1) - Signed by Loa Socks, NP on 08/21/2023      CHIEF COMPLIANT: Cycle 3 dose dense Adriamycin and Cytoxan  HISTORY OF PRESENT ILLNESS:   History of Present Illness   The patient, currently undergoing her third cycle of Adriamycin and Cytoxan for an unspecified cancer, reports experiencing a 'weird tongue feeling,' described as a sensation similar to having burnt her tongue. She also reports hair loss, which she has managed by shaving her head and applying coconut oil for moisturization. She experienced one episode of nausea, which she attributes to the side effects of the shot she received on Friday. She reports feeling extremely tired on the days following her chemotherapy, particularly on Saturday and Sunday. She also reports alternating between constipation and diarrhea since starting chemotherapy.         ALLERGIES:  has No Known Allergies.  MEDICATIONS:  Current Outpatient Medications  Medication Sig Dispense Refill   acetaminophen (TYLENOL) 500 MG tablet Take 500 mg by mouth every 6 (six) hours as needed for mild pain.     Cholecalciferol (VITAMIN D3) 5000 units CAPS Take 5,000 Units by mouth daily.     citalopram (CELEXA) 40 MG tablet Take 0.5 tablets (20 mg total) by mouth daily.     dexamethasone (DECADRON) 4 MG tablet Take 1 tablet by mouth the day after and 1 tablet 2 days after chemo 8 tablet 0   Ibuprofen 200 MG CAPS Take 400 mg by mouth daily as needed (Pain).     lidocaine-prilocaine (EMLA) cream Apply to affected area once 30 g 3   loratadine (CLARITIN) 10 MG tablet Take 10 mg by mouth daily. Liquid gel     LORazepam (ATIVAN) 0.5 MG tablet  Take 1 tablet (0.5 mg total) by mouth 2 (two) times daily as needed for anxiety. 30 tablet 1   ondansetron (ZOFRAN) 8 MG tablet Take 1 tablet (8 mg total) by mouth every 8 (eight) hours as needed for nausea/vomiting. Start third day after doxorubicin/cyclophosphamide chemotherapy. 30 tablet 1   prochlorperazine (COMPAZINE) 10 MG tablet Take 1 tablet (10 mg total) by mouth every 6  (six) hours as needed for nausea or vomiting. 30 tablet 1   sennosides-docusate sodium (SENOKOT-S) 8.6-50 MG tablet Take 2 tablets by mouth daily.     No current facility-administered medications for this visit.    PHYSICAL EXAMINATION: ECOG PERFORMANCE STATUS: 1 - Symptomatic but completely ambulatory  Vitals:   09/12/23 0842  BP: (!) 114/58  Pulse: 81  Resp: 18  Temp: (!) 97.2 F (36.2 C)  SpO2: 100%   Filed Weights   09/12/23 0842  Weight: 145 lb 12.8 oz (66.1 kg)      LABORATORY DATA:  I have reviewed the data as listed    Latest Ref Rng & Units 09/12/2023    8:13 AM 08/29/2023    8:00 AM 08/21/2023    1:36 PM  CMP  Glucose 70 - 99 mg/dL 841  324  96   BUN 6 - 20 mg/dL 9  14  12    Creatinine 0.44 - 1.00 mg/dL 4.01  0.27  2.53   Sodium 135 - 145 mmol/L 137  135  135   Potassium 3.5 - 5.1 mmol/L 3.6  3.9  4.1   Chloride 98 - 111 mmol/L 104  102  103   CO2 22 - 32 mmol/L 25  27  26    Calcium 8.9 - 10.3 mg/dL 8.9  9.1  9.4   Total Protein 6.5 - 8.1 g/dL 6.6  6.6  7.0   Total Bilirubin <1.2 mg/dL 0.3  0.2  0.4   Alkaline Phos 38 - 126 U/L 100  95  82   AST 15 - 41 U/L 15  12  12    ALT 0 - 44 U/L 17  10  12      Lab Results  Component Value Date   WBC 11.8 (H) 09/12/2023   HGB 10.4 (L) 09/12/2023   HCT 31.8 (L) 09/12/2023   MCV 90.9 09/12/2023   PLT 203 09/12/2023   NEUTROABS PENDING 09/12/2023    ASSESSMENT & PLAN:  Malignant neoplasm of upper-inner quadrant of left female breast (HCC) 07/19/2023:Palpable left breast mass for 6 months.  Mammogram and ultrasound revealed large masses.   10:30 position: 4.3 cm with skin thickening and ulceration: Grade 2 IDC with high-grade DCIS ER 95%, PR 95%, Ki67 30%, HER2 2+ by IHC, FISH negative ratio 1.37;  5 o'clock position: 3.3 cm with calcifications spanning 6.7 cm, axilla negative, biopsy: Grade 3 IDC with necrosis ER 95% PR 90% Ki-67 60% HER2 1+ negative   CT CAP 08/03/2023: 2 prominent left breast masses, left  axillary lymph nodes, right hepatic lobe lesion concerning for metastatic breast cancer Ultrasound a liver biopsy scheduled for 08/17/2023   Treatment plan: Neoadjuvant chemotherapy with dose Adriamycin and Cytoxan followed by Taxol Mastectomy left breast Adjuvant radiation Antiestrogen therapy with CDK inhibitors -------------------------------------------------------------------------------------------------------------------------------------- Current treatment: Cycle 3 of dose dense Adriamycin and Cytoxan   Liver biopsy  08/17/2023.  Metastatic breast cancer ER 90%, PR 90%, Ki-67 45%, HER2 0 The reason for using definitive chemotherapy is because she has solitary liver metastasis.  Chemo toxicities: Tongue changes Fatigue Chemo induced anemia  Return to clinic in 2 weeks for cycle 4 ------------------------------------- Assessment and Plan    Chemotherapy (Adriamycin and Zytonsin) Side Effects Reports of altered taste sensation, hair loss, and fatigue. No significant nausea reported. -Continue current chemotherapy regimen. -Encourage use of ice during infusion and oral rinses. -Continue use of coconut oil for skin moisturization.  Constipation/Diarrhea Reports of alternating constipation and diarrhea since the start of chemotherapy. -Monitor bowel habits and manage symptomatically.  General Health Maintenance -Negative Cologuard test. -Continue monitoring potassium levels. -Schedule follow-up appointment for 09/26/2023.          No orders of the defined types were placed in this encounter.  The patient has a good understanding of the overall plan. she agrees with it. she will call with any problems that may develop before the next visit here. Total time spent: 30 mins including face to face time and time spent for planning, charting and co-ordination of care   Tamsen Meek, MD 09/12/23

## 2023-09-14 ENCOUNTER — Inpatient Hospital Stay: Payer: Medicaid Other

## 2023-09-14 VITALS — BP 111/70 | HR 71 | Temp 98.5°F | Resp 18

## 2023-09-14 DIAGNOSIS — C50212 Malignant neoplasm of upper-inner quadrant of left female breast: Secondary | ICD-10-CM

## 2023-09-14 DIAGNOSIS — Z5111 Encounter for antineoplastic chemotherapy: Secondary | ICD-10-CM | POA: Diagnosis not present

## 2023-09-14 MED ORDER — PEGFILGRASTIM-FPGK 6 MG/0.6ML ~~LOC~~ SOSY
6.0000 mg | PREFILLED_SYRINGE | Freq: Once | SUBCUTANEOUS | Status: AC
  Administered 2023-09-14: 6 mg via SUBCUTANEOUS
  Filled 2023-09-14: qty 0.6

## 2023-09-18 ENCOUNTER — Encounter: Payer: Self-pay | Admitting: *Deleted

## 2023-09-18 ENCOUNTER — Encounter: Payer: Self-pay | Admitting: Hematology and Oncology

## 2023-09-25 MED FILL — Fosaprepitant Dimeglumine For IV Infusion 150 MG (Base Eq): INTRAVENOUS | Qty: 5 | Status: AC

## 2023-09-25 NOTE — Assessment & Plan Note (Signed)
07/19/2023:Palpable left breast mass for 6 months.  Mammogram and ultrasound revealed large masses.   10:30 position: 4.3 cm with skin thickening and ulceration: Grade 2 IDC with high-grade DCIS ER 95%, PR 95%, Ki67 30%, HER2 2+ by IHC, FISH negative ratio 1.37;  5 o'clock position: 3.3 cm with calcifications spanning 6.7 cm, axilla negative, biopsy: Grade 3 IDC with necrosis ER 95% PR 90% Ki-67 60% HER2 1+ negative   CT CAP 08/03/2023: 2 prominent left breast masses, left axillary lymph nodes, right hepatic lobe lesion concerning for metastatic breast cancer Ultrasound a liver biopsy scheduled for 08/17/2023   Treatment plan: Neoadjuvant chemotherapy with dose Adriamycin and Cytoxan followed by Taxol Mastectomy left breast Adjuvant radiation Antiestrogen therapy with CDK inhibitors -------------------------------------------------------------------------------------------------------------------------------------- Current treatment: Cycle 4 of dose dense Adriamycin and Cytoxan   Liver biopsy  08/17/2023.  Metastatic breast cancer ER 90%, PR 90%, Ki-67 45%, HER2 0 The reason for using definitive chemotherapy is because she has solitary liver metastasis.   Chemo toxicities: Tongue changes Fatigue Chemo induced anemia   Return to clinic in 2 weeks for cycle 1 Taxol

## 2023-09-26 ENCOUNTER — Other Ambulatory Visit: Payer: Self-pay

## 2023-09-26 ENCOUNTER — Inpatient Hospital Stay: Payer: Medicaid Other

## 2023-09-26 ENCOUNTER — Inpatient Hospital Stay (HOSPITAL_BASED_OUTPATIENT_CLINIC_OR_DEPARTMENT_OTHER): Payer: Medicaid Other | Admitting: Hematology and Oncology

## 2023-09-26 VITALS — BP 106/62 | HR 94 | Temp 97.8°F | Resp 18 | Ht 66.0 in | Wt 144.0 lb

## 2023-09-26 DIAGNOSIS — Z170 Estrogen receptor positive status [ER+]: Secondary | ICD-10-CM

## 2023-09-26 DIAGNOSIS — C50212 Malignant neoplasm of upper-inner quadrant of left female breast: Secondary | ICD-10-CM

## 2023-09-26 DIAGNOSIS — Z95828 Presence of other vascular implants and grafts: Secondary | ICD-10-CM

## 2023-09-26 DIAGNOSIS — Z5111 Encounter for antineoplastic chemotherapy: Secondary | ICD-10-CM | POA: Diagnosis not present

## 2023-09-26 LAB — CMP (CANCER CENTER ONLY)
ALT: 15 U/L (ref 0–44)
AST: 17 U/L (ref 15–41)
Albumin: 4 g/dL (ref 3.5–5.0)
Alkaline Phosphatase: 103 U/L (ref 38–126)
Anion gap: 6 (ref 5–15)
BUN: 13 mg/dL (ref 6–20)
CO2: 26 mmol/L (ref 22–32)
Calcium: 8.8 mg/dL — ABNORMAL LOW (ref 8.9–10.3)
Chloride: 103 mmol/L (ref 98–111)
Creatinine: 0.68 mg/dL (ref 0.44–1.00)
GFR, Estimated: >60 mL/min (ref >60)
Glucose, Bld: 122 mg/dL — ABNORMAL HIGH (ref 70–99)
Potassium: 3.7 mmol/L (ref 3.5–5.1)
Sodium: 135 mmol/L (ref 135–145)
Total Bilirubin: 0.2 mg/dL (ref <1.2)
Total Protein: 6.5 g/dL (ref 6.5–8.1)

## 2023-09-26 LAB — CBC WITH DIFFERENTIAL (CANCER CENTER ONLY)
Abs Immature Granulocytes: 1.13 10*3/uL — ABNORMAL HIGH (ref 0.00–0.07)
Basophils Absolute: 0.1 10*3/uL (ref 0.0–0.1)
Basophils Relative: 1 %
Eosinophils Absolute: 0 10*3/uL (ref 0.0–0.5)
Eosinophils Relative: 0 %
HCT: 30.6 % — ABNORMAL LOW (ref 36.0–46.0)
Hemoglobin: 10 g/dL — ABNORMAL LOW (ref 12.0–15.0)
Immature Granulocytes: 9 %
Lymphocytes Relative: 10 %
Lymphs Abs: 1.2 10*3/uL (ref 0.7–4.0)
MCH: 30.7 pg (ref 26.0–34.0)
MCHC: 32.7 g/dL (ref 30.0–36.0)
MCV: 93.9 fL (ref 80.0–100.0)
Monocytes Absolute: 1 10*3/uL (ref 0.1–1.0)
Monocytes Relative: 8 %
Neutro Abs: 8.8 10*3/uL — ABNORMAL HIGH (ref 1.7–7.7)
Neutrophils Relative %: 72 %
Platelet Count: 268 10*3/uL (ref 150–400)
RBC: 3.26 MIL/uL — ABNORMAL LOW (ref 3.87–5.11)
RDW: 17.3 % — ABNORMAL HIGH (ref 11.5–15.5)
WBC Count: 12.2 10*3/uL — ABNORMAL HIGH (ref 4.0–10.5)
nRBC: 0.7 % — ABNORMAL HIGH (ref 0.0–0.2)

## 2023-09-26 LAB — PREGNANCY, URINE: Preg Test, Ur: NEGATIVE

## 2023-09-26 MED ORDER — PALONOSETRON HCL INJECTION 0.25 MG/5ML
0.2500 mg | Freq: Once | INTRAVENOUS | Status: AC
  Administered 2023-09-26: 0.25 mg via INTRAVENOUS
  Filled 2023-09-26: qty 5

## 2023-09-26 MED ORDER — DOXORUBICIN HCL CHEMO IV INJECTION 2 MG/ML
60.0000 mg/m2 | Freq: Once | INTRAVENOUS | Status: AC
  Administered 2023-09-26: 108 mg via INTRAVENOUS
  Filled 2023-09-26: qty 54

## 2023-09-26 MED ORDER — SODIUM CHLORIDE 0.9% FLUSH
10.0000 mL | INTRAVENOUS | Status: DC | PRN
  Administered 2023-09-26: 10 mL

## 2023-09-26 MED ORDER — SODIUM CHLORIDE 0.9% FLUSH
10.0000 mL | Freq: Once | INTRAVENOUS | Status: AC
  Administered 2023-09-26: 10 mL

## 2023-09-26 MED ORDER — SODIUM CHLORIDE 0.9 % IV SOLN: Freq: Once | INTRAVENOUS | Status: AC

## 2023-09-26 MED ORDER — HEPARIN SOD (PORK) LOCK FLUSH 100 UNIT/ML IV SOLN
500.0000 [IU] | Freq: Once | INTRAVENOUS | Status: AC | PRN
  Administered 2023-09-26: 500 [IU]

## 2023-09-26 MED ORDER — DEXAMETHASONE SODIUM PHOSPHATE 10 MG/ML IJ SOLN
10.0000 mg | Freq: Once | INTRAMUSCULAR | Status: AC
  Administered 2023-09-26: 10 mg via INTRAVENOUS
  Filled 2023-09-26: qty 1

## 2023-09-26 MED ORDER — SODIUM CHLORIDE 0.9 % IV SOLN
150.0000 mg | Freq: Once | INTRAVENOUS | Status: AC
  Administered 2023-09-26: 150 mg via INTRAVENOUS
  Filled 2023-09-26: qty 150

## 2023-09-26 MED ORDER — CYCLOPHOSPHAMIDE CHEMO INJECTION 1 GM
600.0000 mg/m2 | Freq: Once | INTRAMUSCULAR | Status: AC
  Administered 2023-09-26: 1000 mg via INTRAVENOUS
  Filled 2023-09-26: qty 50

## 2023-09-26 NOTE — Patient Instructions (Signed)
[facility] - A DEPT OF [facility]  Discharge Instructions: Thank you for choosing [facility] to provide your oncology and hematology care.   If you have a lab appointment with the Cancer Center, please go directly to the Cancer Center and check in at the registration area.   Wear comfortable clothing and clothing appropriate for easy access to any Portacath or PICC line.   We strive to give you quality time with your provider. You may need to reschedule your appointment if you arrive late (15 or more minutes).  Arriving late affects you and other patients whose appointments are after yours.  Also, if you miss three or more appointments without notifying the office, you may be dismissed from the clinic at the provider's discretion.      For prescription refill requests, have your pharmacy contact our office and allow 72 hours for refills to be completed.    Today you received the following chemotherapy and/or immunotherapy agents: doxorubicin and cyclophosphamide      To help prevent nausea and vomiting after your treatment, we encourage you to take your nausea medication as directed.  BELOW ARE SYMPTOMS THAT SHOULD BE REPORTED IMMEDIATELY: *FEVER GREATER THAN 100.4 F (38 C) OR HIGHER *CHILLS OR SWEATING *NAUSEA AND VOMITING THAT IS NOT CONTROLLED WITH YOUR NAUSEA MEDICATION *UNUSUAL SHORTNESS OF BREATH *UNUSUAL BRUISING OR BLEEDING *URINARY PROBLEMS (pain or burning when urinating, or frequent urination) *BOWEL PROBLEMS (unusual diarrhea, constipation, pain near the anus) TENDERNESS IN MOUTH AND THROAT WITH OR WITHOUT PRESENCE OF ULCERS (sore throat, sores in mouth, or a toothache) UNUSUAL RASH, SWELLING OR PAIN  UNUSUAL VAGINAL DISCHARGE OR ITCHING   Items with * indicate a potential emergency and should be followed up as soon as possible or go to the Emergency Department if any problems should occur.  Please show the CHEMOTHERAPY  ALERT CARD or IMMUNOTHERAPY ALERT CARD at check-in to the Emergency Department and triage nurse.  Should you have questions after your visit or need to cancel or reschedule your appointment, please contact [facility] - A DEPT OF [facility]  Dept: [phone]  and follow the prompts.  Office hours are 8:00 a.m. to 4:30 p.m. Monday - Friday. Please note that voicemails left after 4:00 p.m. may not be returned until the following business day.  We are closed weekends and major holidays. You have access to a nurse at all times for urgent questions. Please call the main number to the clinic Dept: [phone] and follow the prompts.   For any non-urgent questions, you may also contact your provider using MyChart. We now offer e-Visits for anyone 49 and older to request care online for non-urgent symptoms. For details visit [email or web address].   Also download the MyChart app! Go to the app store, search "MyChart", open the app, select [place], and log in with your MyChart username and password.

## 2023-09-26 NOTE — Research (Signed)
Trial Name:  S2205, ICE COMPRESS: RANDOMIZED TRIAL OF LIMB CRYOCOMPRESSION VERSUS CONTINUOUS COMPRESSION VERSUS LOW CYCLIC COMPRESSION FOR THE PREVENTION  OF TAXANE-INDUCED PERIPHERAL NEUROPATHY   [name] was identified by Dr. Pamelia Hoit as a potential candidate for the above listed study.  This Clinical Research Nurse met with Ann Daugherty, Ann Daugherty on 09/26/23 in a manner and location that ensures patient privacy to discuss participation in the above listed research study.  Patient is Unaccompanied.  Patient was previously provided with informed consent documents.  Patient confirmed they have read the informed consent documents.  As outlined in the informed consent form, this Nurse and Ann Daugherty discussed the purpose of the research study, the investigational nature of the study, study procedures and requirements for study participation, potential risks and benefits of study participation, as well as alternatives to participation.  This study is not blinded or double-blinded. The patient understands participation is voluntary and they may withdraw from study participation at any time.  Each study arm was reviewed, and randomization discussed.  Potential side effects were reviewed with patient as outlined in the consent form, and patient made aware there may be side effects not yet known. This study does not involve a placebo. Patient understands enrollment is pending full eligibility review.   Confidentiality and how the patient's information will be used as part of study participation were discussed.  Patient was informed there is not reimbursement provided for their time and effort spent on trial participation.  The patient is encouraged to discuss research study participation with their insurance provider to determine what costs they may incur as part of study participation, including research related injury.    All questions were answered to patient's satisfaction.  The  informed consent and separate HIPAA Authorization was reviewed page by page.  The patient's mental and emotional status is appropriate to provide informed consent, and the patient verbalizes an understanding of study participation.  Patient has agreed to participate in the above listed research study and has voluntarily signed the informed consent version [place] Active Date 06-27-22 and separate HIPAA Authorization, version Date 09-20-23  on 09/26/23 at 0910AM.  The patient was provided with a copy of the signed informed consent form and separate HIPAA Authorization for their reference.  No study specific procedures were obtained prior to the signing of the informed consent document.  Approximately 15 minutes were spent with the patient reviewing the informed consent documents.  Patient was not requested to complete a Release of Information form.  Patient was provided with the informational handout "Clinical Trial S2205: What to Expect from the Study Device." All questions about the device were answered to the patient's satisfaction.  Baseline PRO's completed followed by baseline neuro assessment. Instructed pt that randomization and registration cannot take place until three days prior to study intervention. Pt verbalized understanding. Pt has no further questions at this time.   Patient was thanked for her participation and time.  Ann Chance Shawniece Oyola, RN, BSN, [facility]  [facility]  [facility] Clinical Research Nurse [facility] Direct Dial [phone] 09/26/2023 9:43 AM

## 2023-09-26 NOTE — Progress Notes (Signed)
Patient Care Team: [name], [name] as PCP - General (Physician Assistant) Obgyn, Ma Hillock as Consulting Physician (Obstetrics and Gynecology) Donalee Citrin, MD as Consulting Physician (Neurosurgery) Donnelly Angelica, RN as Oncology Nurse Navigator Pershing Proud, RN as Oncology Nurse Navigator Serena Croissant, MD as Consulting Physician (Hematology and Oncology) Almond Lint, MD as Consulting Physician (General Surgery) Lonie Peak, MD as Attending Physician (Radiation Oncology)  DIAGNOSIS:  Encounter Diagnosis  Name Primary?   Malignant neoplasm of upper-inner quadrant of left breast in female, estrogen receptor positive (HCC) Yes    SUMMARY OF ONCOLOGIC HISTORY: Oncology History  Malignant neoplasm of upper-inner quadrant of left female breast (HCC)  07/19/2023 Initial Diagnosis   Palpable left breast mass for 6 months.  Mammogram and ultrasound revealed large masses.  10:30 position: 4.3 cm with skin thickening and ulceration: Grade 2 IDC with high-grade DCIS ER 95%, PR 95%, Ki67 30%, HER2 2+ by IHC, FISH negative ratio 1.37; 5 o'clock position: 3.3 cm with calcifications spanning 6.7 cm, axilla negative, biopsy: Grade 3 IDC with necrosis ER 95% PR 90% Ki-67 60% HER2 1+ negative   07/26/2023 Cancer Staging   Staging form: Breast, AJCC 8th Edition - Clinical: Stage IIIB (cT4b, cN0, cM0, G3, ER+, PR+, HER2-) - Signed by Serena Croissant, MD on 07/26/2023 Histologic grading system: 3 grade system   08/15/2023 -  Chemotherapy   Patient is on Treatment Plan : BREAST ADJUVANT DOSE DENSE AC q14d / PACLitaxel q7d     08/17/2023 Initial Biopsy   Liver biopsy: consistent with metastatic carcinoma, breast primary.     08/21/2023 Cancer Staging   Staging form: Breast, AJCC 8th Edition - Pathologic: Stage IV (pM1) - Signed by Loa Socks, NP on 08/21/2023     CHIEF COMPLIANT: Cycle 4 Adriamycin and Cytoxan  HISTORY OF PRESENT ILLNESS:   History of Present Illness   The  patient, with a history of breast cancer, presents for a follow-up after the last round of chemotherapy with Adriamycin and Cytoxan. She reports mild nausea and heartburn, which she attempted to manage with Tums and Zantac. The patient also experienced a prolonged menstrual period lasting twelve days, which has since ended. She expresses hope that this might be the last menstrual cycle, indicating possible chemotherapy-induced menopause. The patient also reports increased fatigue after the last round of chemotherapy, which seems to be gradually worsening. She is scheduled to start a new phase of chemotherapy with Taxol, and expresses relief that the timing of the treatment will allow for a 'good weekend' during Thanksgiving.         ALLERGIES:  has No Known Allergies.  MEDICATIONS:  Current Outpatient Medications  Medication Sig Dispense Refill   acetaminophen (TYLENOL) 500 MG tablet Take 500 mg by mouth every 6 (six) hours as needed for mild pain.     Cholecalciferol (VITAMIN D3) 5000 units CAPS Take 5,000 Units by mouth daily.     citalopram (CELEXA) 40 MG tablet Take 0.5 tablets (20 mg total) by mouth daily.     dexamethasone (DECADRON) 4 MG tablet Take 1 tablet by mouth the day after and 1 tablet 2 days after chemo 8 tablet 0   Ibuprofen 200 MG CAPS Take 400 mg by mouth daily as needed (Pain).     lidocaine-prilocaine (EMLA) cream Apply to affected area once 30 g 3   loratadine (CLARITIN) 10 MG tablet Take 10 mg by mouth daily. Liquid gel     LORazepam (ATIVAN) 0.5 MG tablet Take 1  tablet (0.5 mg total) by mouth 2 (two) times daily as needed for anxiety. 30 tablet 1   ondansetron (ZOFRAN) 8 MG tablet Take 1 tablet (8 mg total) by mouth every 8 (eight) hours as needed for nausea/vomiting. Start third day after doxorubicin/cyclophosphamide chemotherapy. 30 tablet 1   prochlorperazine (COMPAZINE) 10 MG tablet Take 1 tablet (10 mg total) by mouth every 6 (six) hours as needed for nausea or  vomiting. 30 tablet 1   sennosides-docusate sodium (SENOKOT-S) 8.6-50 MG tablet Take 2 tablets by mouth daily.     No current facility-administered medications for this visit.    PHYSICAL EXAMINATION: ECOG PERFORMANCE STATUS: 1 - Symptomatic but completely ambulatory  Vitals:   09/26/23 0849  BP: 106/62  Pulse: 94  Resp: 18  Temp: 97.8 F (36.6 C)  SpO2: 100%   Filed Weights   09/26/23 0849  Weight: 144 lb (65.3 kg)     LABORATORY DATA:  I have reviewed the data as listed    Latest Ref Rng & Units 09/12/2023    8:13 AM 08/29/2023    8:00 AM 08/21/2023    1:36 PM  CMP  Glucose 70 - 99 mg/dL 093  235  96   BUN 6 - 20 mg/dL 9  14  12    Creatinine 0.44 - 1.00 mg/dL 5.73  2.20  2.54   Sodium 135 - 145 mmol/L 137  135  135   Potassium 3.5 - 5.1 mmol/L 3.6  3.9  4.1   Chloride 98 - 111 mmol/L 104  102  103   CO2 22 - 32 mmol/L 25  27  26    Calcium 8.9 - 10.3 mg/dL 8.9  9.1  9.4   Total Protein 6.5 - 8.1 g/dL 6.6  6.6  7.0   Total Bilirubin <1.2 mg/dL 0.3  0.2  0.4   Alkaline Phos 38 - 126 U/L 100  95  82   AST 15 - 41 U/L 15  12  12    ALT 0 - 44 U/L 17  10  12      Lab Results  Component Value Date   WBC 12.2 (H) 09/26/2023   HGB 10.0 (L) 09/26/2023   HCT 30.6 (L) 09/26/2023   MCV 93.9 09/26/2023   PLT 268 09/26/2023   NEUTROABS 8.8 (H) 09/26/2023    ASSESSMENT & PLAN:  Malignant neoplasm of upper-inner quadrant of left female breast (HCC) 07/19/2023:Palpable left breast mass for 6 months.  Mammogram and ultrasound revealed large masses.   10:30 position: 4.3 cm with skin thickening and ulceration: Grade 2 IDC with high-grade DCIS ER 95%, PR 95%, Ki67 30%, HER2 2+ by IHC, FISH negative ratio 1.37;  5 o'clock position: 3.3 cm with calcifications spanning 6.7 cm, axilla negative, biopsy: Grade 3 IDC with necrosis ER 95% PR 90% Ki-67 60% HER2 1+ negative   CT CAP 08/03/2023: 2 prominent left breast masses, left axillary lymph nodes, right hepatic lobe lesion concerning  for metastatic breast cancer Ultrasound a liver biopsy scheduled for 08/17/2023   Treatment plan: Neoadjuvant chemotherapy with dose Adriamycin and Cytoxan followed by Taxol Mastectomy left breast Adjuvant radiation Antiestrogen therapy with CDK inhibitors -------------------------------------------------------------------------------------------------------------------------------------- Current treatment: Cycle 4 of dose dense Adriamycin and Cytoxan   Liver biopsy  08/17/2023.  Metastatic breast cancer ER 90%, PR 90%, Ki-67 45%, HER2 0 The reason for using definitive chemotherapy is because she has solitary liver metastasis.   Chemo toxicities: Tongue changes Fatigue Chemo induced anemia   Return to clinic in  2 weeks for cycle 1 Taxol ------------------------------------- Assessment and Plan    Chemotherapy-induced Nausea and Heartburn Mild symptoms following Adriamycin and Cytoxan. Patient has been using Tums and Zantac with some relief. -Continue Zantac as needed. -Consider prescription strength proton pump inhibitor if symptoms persist.  Menorrhagia 12-day menstrual period, likely secondary to chemotherapy. No significant anemia noted. -Expect resolution as chemotherapy progresses and ovarian function decreases.  Chemotherapy Transition Completion of Adriamycin and Cytoxan phase. Transitioning to Taxol phase. -First Taxol treatment scheduled for 10/10/2023. -Neulasta not routinely needed with Taxol, but may be considered if white count decreases significantly during treatment.  General Health Maintenance -Schedule additional Taxol appointments through December and into January to ensure availability. -Check labs prior to each treatment.          No orders of the defined types were placed in this encounter.  The patient has a good understanding of the overall plan. she agrees with it. she will call with any problems that may develop before the next visit  here. Total time spent: 30 mins including face to face time and time spent for planning, charting and co-ordination of care   Tamsen Meek, MD 09/26/23

## 2023-09-27 ENCOUNTER — Other Ambulatory Visit: Payer: Self-pay

## 2023-09-27 DIAGNOSIS — C787 Secondary malignant neoplasm of liver and intrahepatic bile duct: Secondary | ICD-10-CM | POA: Diagnosis not present

## 2023-09-27 DIAGNOSIS — Z170 Estrogen receptor positive status [ER+]: Secondary | ICD-10-CM | POA: Diagnosis not present

## 2023-09-27 DIAGNOSIS — C50212 Malignant neoplasm of upper-inner quadrant of left female breast: Secondary | ICD-10-CM | POA: Diagnosis not present

## 2023-09-27 DIAGNOSIS — C50912 Malignant neoplasm of unspecified site of left female breast: Secondary | ICD-10-CM | POA: Diagnosis not present

## 2023-09-28 ENCOUNTER — Inpatient Hospital Stay: Payer: Medicaid Other

## 2023-09-28 VITALS — BP 111/53 | HR 74 | Temp 97.8°F | Resp 18

## 2023-09-28 DIAGNOSIS — C50212 Malignant neoplasm of upper-inner quadrant of left female breast: Secondary | ICD-10-CM

## 2023-09-28 DIAGNOSIS — Z5111 Encounter for antineoplastic chemotherapy: Secondary | ICD-10-CM | POA: Diagnosis not present

## 2023-09-28 MED ORDER — PEGFILGRASTIM-FPGK 6 MG/0.6ML ~~LOC~~ SOSY
6.0000 mg | PREFILLED_SYRINGE | Freq: Once | SUBCUTANEOUS | Status: AC
  Administered 2023-09-28: 6 mg via SUBCUTANEOUS
  Filled 2023-09-28: qty 0.6

## 2023-10-07 DIAGNOSIS — Z419 Encounter for procedure for purposes other than remedying health state, unspecified: Secondary | ICD-10-CM | POA: Diagnosis not present

## 2023-10-08 ENCOUNTER — Telehealth: Payer: Self-pay

## 2023-10-08 DIAGNOSIS — C50212 Malignant neoplasm of upper-inner quadrant of left female breast: Secondary | ICD-10-CM

## 2023-10-08 NOTE — Research (Signed)
S2205, ICE COMPRESS: RANDOMIZED TRIAL OF LIMB CRYOCOMPRESSION VERSUS CONTINUOUS COMPRESSION VERSUS LOW CYCLIC COMPRESSION FOR THE PREVENTION  OF TAXANE-INDUCED PERIPHERAL NEUROPATHY  This Nurse has reviewed this patient's inclusion and exclusion criteria and confirmed Ann Daugherty is eligible for study participation.  Patient will continue with enrollment.  Eligibility confirmed by treating investigator, who also agrees that patient should proceed with enrollment.  Zerita Boers BSN RN Clinical Research Nurse [facility] Direct Dial: [phone] 10/08/2023  10:34 AM

## 2023-10-08 NOTE — Telephone Encounter (Signed)
N8295, ICE COMPRESS: RANDOMIZED TRIAL OF LIMB CRYOCOMPRESSION VERSUS CONTINUOUS COMPRESSION VERSUS LOW CYCLIC COMPRESSION FOR THE PREVENTION  OF TAXANE-INDUCED PERIPHERAL NEUROPATHY  RN called pt to inform her that she has been randomized to the cryocompression arm of the S2205 clinical trial. Pt had had no questions or concerns at this time. Pt to have her first clinical intervention for trial on 10-10-23.   Zerita Boers BSN RN Clinical Research Nurse [facility] Direct Dial: [phone] 10/08/2023  1:27 PM

## 2023-10-08 NOTE — Research (Signed)
U0454, ICE COMPRESS: RANDOMIZED TRIAL OF LIMB CRYOCOMPRESSION VERSUS CONTINUOUS COMPRESSION VERSUS LOW CYCLIC COMPRESSION FOR THE PREVENTION  OF TAXANE-INDUCED PERIPHERAL NEUROPATHY   RN called pt to verify that she has no wounds, lesions, metastasis, or sores on upper or lower extremities bilaterally. Pt had no questions or concerns with this phone call. Pt is aware she will begin clinical trial on Wednesday 10-10-23.   Zerita Boers BSN RN Clinical Research Nurse [facility] Direct Dial: [phone] 10/08/2023  9:41 AM

## 2023-10-09 ENCOUNTER — Other Ambulatory Visit: Payer: Self-pay

## 2023-10-09 NOTE — Assessment & Plan Note (Signed)
07/19/2023:Palpable left breast mass for 6 months.  Mammogram and ultrasound revealed large masses.   10:30 position: 4.3 cm with skin thickening and ulceration: Grade 2 IDC with high-grade DCIS ER 95%, PR 95%, Ki67 30%, HER2 2+ by IHC, FISH negative ratio 1.37;  5 o'clock position: 3.3 cm with calcifications spanning 6.7 cm, axilla negative, biopsy: Grade 3 IDC with necrosis ER 95% PR 90% Ki-67 60% HER2 1+ negative   CT CAP 08/03/2023: 2 prominent left breast masses, left axillary lymph nodes, right hepatic lobe lesion concerning for metastatic breast cancer Ultrasound a liver biopsy scheduled for 08/17/2023   Treatment plan: Neoadjuvant chemotherapy with dose Adriamycin and Cytoxan followed by Taxol Mastectomy left breast Adjuvant radiation Antiestrogen therapy with CDK inhibitors -------------------------------------------------------------------------------------------------------------------------------------- Current treatment: Completed 4 cycles of dose dense Adriamycin and Cytoxan, today is cycle 1 Taxol   Liver biopsy  08/17/2023.  Metastatic breast cancer ER 90%, PR 90%, Ki-67 45%, HER2 0 The reason for using definitive chemotherapy is because she has solitary liver metastasis.   Chemo toxicities: Tongue changes Fatigue Chemo induced anemia: Today's hemoglobin is better.  Ice compress study: Patient got randomized to compression and ice.   Return to clinic weekly for Taxol and every other week for follow up with [place]

## 2023-10-10 ENCOUNTER — Inpatient Hospital Stay: Payer: Medicaid Other

## 2023-10-10 ENCOUNTER — Inpatient Hospital Stay: Payer: Medicaid Other | Attending: Hematology and Oncology

## 2023-10-10 ENCOUNTER — Inpatient Hospital Stay (HOSPITAL_BASED_OUTPATIENT_CLINIC_OR_DEPARTMENT_OTHER): Payer: Medicaid Other | Admitting: Hematology and Oncology

## 2023-10-10 ENCOUNTER — Encounter: Payer: Self-pay | Admitting: Hematology and Oncology

## 2023-10-10 VITALS — BP 115/66 | HR 100 | Temp 97.5°F | Resp 18 | Wt 143.0 lb

## 2023-10-10 VITALS — BP 96/63 | HR 77 | Temp 98.4°F | Resp 19

## 2023-10-10 DIAGNOSIS — Z87891 Personal history of nicotine dependence: Secondary | ICD-10-CM | POA: Diagnosis not present

## 2023-10-10 DIAGNOSIS — Z170 Estrogen receptor positive status [ER+]: Secondary | ICD-10-CM | POA: Insufficient documentation

## 2023-10-10 DIAGNOSIS — C50212 Malignant neoplasm of upper-inner quadrant of left female breast: Secondary | ICD-10-CM | POA: Diagnosis not present

## 2023-10-10 DIAGNOSIS — Z5111 Encounter for antineoplastic chemotherapy: Secondary | ICD-10-CM | POA: Diagnosis not present

## 2023-10-10 DIAGNOSIS — Z95828 Presence of other vascular implants and grafts: Secondary | ICD-10-CM

## 2023-10-10 DIAGNOSIS — C787 Secondary malignant neoplasm of liver and intrahepatic bile duct: Secondary | ICD-10-CM | POA: Insufficient documentation

## 2023-10-10 LAB — CMP (CANCER CENTER ONLY)
ALT: 18 U/L (ref 0–44)
AST: 22 U/L (ref 15–41)
Albumin: 4.3 g/dL (ref 3.5–5.0)
Alkaline Phosphatase: 103 U/L (ref 38–126)
Anion gap: 7 (ref 5–15)
BUN: 8 mg/dL (ref 6–20)
CO2: 28 mmol/L (ref 22–32)
Calcium: 9.5 mg/dL (ref 8.9–10.3)
Chloride: 106 mmol/L (ref 98–111)
Creatinine: 0.69 mg/dL (ref 0.44–1.00)
GFR, Estimated: >60 mL/min (ref >60)
Glucose, Bld: 118 mg/dL — ABNORMAL HIGH (ref 70–99)
Potassium: 3.7 mmol/L (ref 3.5–5.1)
Sodium: 141 mmol/L (ref 135–145)
Total Bilirubin: 0.3 mg/dL (ref <1.2)
Total Protein: 6.8 g/dL (ref 6.5–8.1)

## 2023-10-10 LAB — CBC WITH DIFFERENTIAL (CANCER CENTER ONLY)
Abs Immature Granulocytes: 0.59 10*3/uL — ABNORMAL HIGH (ref 0.00–0.07)
Basophils Absolute: 0.1 10*3/uL (ref 0.0–0.1)
Basophils Relative: 1 %
Eosinophils Absolute: 0 10*3/uL (ref 0.0–0.5)
Eosinophils Relative: 0 %
HCT: 34.1 % — ABNORMAL LOW (ref 36.0–46.0)
Hemoglobin: 11.1 g/dL — ABNORMAL LOW (ref 12.0–15.0)
Immature Granulocytes: 6 %
Lymphocytes Relative: 10 %
Lymphs Abs: 1 10*3/uL (ref 0.7–4.0)
MCH: 31 pg (ref 26.0–34.0)
MCHC: 32.6 g/dL (ref 30.0–36.0)
MCV: 95.3 fL (ref 80.0–100.0)
Monocytes Absolute: 0.8 10*3/uL (ref 0.1–1.0)
Monocytes Relative: 9 %
Neutro Abs: 7.3 10*3/uL (ref 1.7–7.7)
Neutrophils Relative %: 74 %
Platelet Count: 268 10*3/uL (ref 150–400)
RBC: 3.58 MIL/uL — ABNORMAL LOW (ref 3.87–5.11)
RDW: 20.1 % — ABNORMAL HIGH (ref 11.5–15.5)
WBC Count: 9.8 10*3/uL (ref 4.0–10.5)
nRBC: 0.4 % — ABNORMAL HIGH (ref 0.0–0.2)

## 2023-10-10 LAB — PREGNANCY, URINE: Preg Test, Ur: NEGATIVE

## 2023-10-10 LAB — RESEARCH LABS

## 2023-10-10 MED ORDER — HEPARIN SOD (PORK) LOCK FLUSH 100 UNIT/ML IV SOLN
500.0000 [IU] | Freq: Once | INTRAVENOUS | Status: AC | PRN
  Administered 2023-10-10: 500 [IU]

## 2023-10-10 MED ORDER — DEXAMETHASONE SODIUM PHOSPHATE 10 MG/ML IJ SOLN
10.0000 mg | Freq: Once | INTRAMUSCULAR | Status: AC
Start: 2023-10-10 — End: 2023-10-10
  Administered 2023-10-10: 10 mg via INTRAVENOUS
  Filled 2023-10-10: qty 1

## 2023-10-10 MED ORDER — SODIUM CHLORIDE 0.9 % IV SOLN
80.0000 mg/m2 | Freq: Once | INTRAVENOUS | Status: AC
  Administered 2023-10-10: 144 mg via INTRAVENOUS
  Filled 2023-10-10: qty 24

## 2023-10-10 MED ORDER — SODIUM CHLORIDE 0.9 % IV SOLN: Freq: Once | INTRAVENOUS | Status: AC

## 2023-10-10 MED ORDER — SODIUM CHLORIDE 0.9% FLUSH
10.0000 mL | INTRAVENOUS | Status: DC | PRN
  Administered 2023-10-10: 10 mL

## 2023-10-10 MED ORDER — FAMOTIDINE IN NACL 20-0.9 MG/50ML-% IV SOLN
20.0000 mg | Freq: Once | INTRAVENOUS | Status: AC
  Administered 2023-10-10: 20 mg via INTRAVENOUS
  Filled 2023-10-10: qty 50

## 2023-10-10 MED ORDER — SODIUM CHLORIDE 0.9% FLUSH
10.0000 mL | Freq: Once | INTRAVENOUS | Status: AC
  Administered 2023-10-10: 10 mL

## 2023-10-10 MED ORDER — DIPHENHYDRAMINE HCL 50 MG/ML IJ SOLN
25.0000 mg | Freq: Once | INTRAMUSCULAR | Status: AC
  Administered 2023-10-10: 25 mg via INTRAVENOUS
  Filled 2023-10-10: qty 1

## 2023-10-10 NOTE — Research (Addendum)
 U9811, ICE COMPRESS: RANDOMIZED TRIAL OF LIMB CRYOCOMPRESSION VERSUS CONTINUOUS COMPRESSION VERSUS LOW CYCLIC COMPRESSION FOR THE PREVENTION  OF TAXANE-INDUCED PERIPHERAL NEUROPATHY   Patient arrives today Unaccompanied for the cycle 1 visit. Confirmed patient does not have wounds, sores, or lesions to extremities. Patient has not had any vaccinations since last visit.   LABS: Optional labs are collected per consent and study protocol: [name] tolerated well without complaint.  ADVERSE EVENTS: Patient reports no adverse events.   SOLICITED ADVERSE EVENTS:   Adverse Event CTCAE Grade Relationship to Study Intervention Action Taken Comments  Skin atrophy 0 NA    Skin hyperpigmentation 0 NA    Skin hypopigmentation 0 NA    Skin induration 0 NA    Skin ulceration 0 NA    Rash maculopapular 0 NA    Nail changes 0 NA    Cold intolerance (general disorders and administration site conditions- other) 0 NA    Frostbite (skin and subcutaneous tissue disorders- other) 0 NA       STUDY INTERVENTION & TOLERABILITY ASSESSMENTS: 11:17 am- Wraps applied; pre-treatment started. 11:27 am -Research RN notified by Infusion RN of low flow alert. RN looked at wraps and reapplied to pt. No further issues to note.  11:53 am- Taxane infusion start time. Moved to treatment phase. Starting temperature 11 degrees Celsius. Starting pressure 5-15 mmHG. 11:55 am- Machine replaced for pt arms due to continued low flow pressure alert. Resumed at 12:00 pm.  12:03 pm- Required 5-10 minute Tolerability check- pt states tolerable to cooling and compression 12:20 pm- PRN Tolerability check-pt states tolerable to cooling and compression. Research RN was with pt in infusion room.  12:53 pm- Required 60 minute Tolerability check- pt states tolerable to cooling and compression 1:37 pm- Taxane infusion end time. Moved to post-treatment phase.  1:53 pm- Required 60 minute Tolerability check- pt states  tolerable to cooling and compression 2:07 pm- Study intervention end time. Confirmed no new wounds, sores, or lesions.  DISPOSITION: Upon completion off all study requirements, patient was escorted out of chair and pt ambulated to restroom. Pt understands that research will meet her with cycle 2 that is scheduled for 10-17-23.   The patient was thanked for their time and continued voluntary participation in this study. [name] has been provided direct contact information and is encouraged to contact this Nurse for any needs or questions.  Zerita Boers BSN RN Clinical Research Nurse [facility] Direct Dial: [phone] 10/10/2023  3:12 PM

## 2023-10-10 NOTE — Patient Instructions (Signed)
[facility] - A DEPT OF [facility]  Discharge Instructions: Thank you for choosing [facility] to provide your oncology and hematology care.   If you have a lab appointment with the Cancer Center, please go directly to the Cancer Center and check in at the registration area.   Wear comfortable clothing and clothing appropriate for easy access to any Portacath or PICC line.   We strive to give you quality time with your provider. You may need to reschedule your appointment if you arrive late (15 or more minutes).  Arriving late affects you and other patients whose appointments are after yours.  Also, if you miss three or more appointments without notifying the office, you may be dismissed from the clinic at the provider's discretion.      For prescription refill requests, have your pharmacy contact our office and allow 72 hours for refills to be completed.    Today you received the following chemotherapy and/or immunotherapy agents: Paclitaxel      To help prevent nausea and vomiting after your treatment, we encourage you to take your nausea medication as directed.  BELOW ARE SYMPTOMS THAT SHOULD BE REPORTED IMMEDIATELY: *FEVER GREATER THAN 100.4 F (38 C) OR HIGHER *CHILLS OR SWEATING *NAUSEA AND VOMITING THAT IS NOT CONTROLLED WITH YOUR NAUSEA MEDICATION *UNUSUAL SHORTNESS OF BREATH *UNUSUAL BRUISING OR BLEEDING *URINARY PROBLEMS (pain or burning when urinating, or frequent urination) *BOWEL PROBLEMS (unusual diarrhea, constipation, pain near the anus) TENDERNESS IN MOUTH AND THROAT WITH OR WITHOUT PRESENCE OF ULCERS (sore throat, sores in mouth, or a toothache) UNUSUAL RASH, SWELLING OR PAIN  UNUSUAL VAGINAL DISCHARGE OR ITCHING   Items with * indicate a potential emergency and should be followed up as soon as possible or go to the Emergency Department if any problems should occur.  Please show the CHEMOTHERAPY ALERT CARD or IMMUNOTHERAPY  ALERT CARD at check-in to the Emergency Department and triage nurse.  Should you have questions after your visit or need to cancel or reschedule your appointment, please contact [facility] - A DEPT OF [facility]  Dept: [phone]  and follow the prompts.  Office hours are 8:00 a.m. to 4:30 p.m. Monday - Friday. Please note that voicemails left after 4:00 p.m. may not be returned until the following business day.  We are closed weekends and major holidays. You have access to a nurse at all times for urgent questions. Please call the main number to the clinic Dept: [phone] and follow the prompts.   For any non-urgent questions, you may also contact your provider using MyChart. We now offer e-Visits for anyone 89 and older to request care online for non-urgent symptoms. For details visit [email or web address].   Also download the MyChart app! Go to the app store, search "MyChart", open the app, select [facility], and log in with your MyChart username and password.  Paclitaxel Injection What is this medication? PACLITAXEL (PAK li TAX el) treats some types of cancer. It works by slowing down the growth of cancer cells. This medicine may be used for other purposes; ask your health care provider or pharmacist if you have questions. COMMON BRAND NAME(S): Onxol, Taxol What should I tell my care team before I take this medication? They need to know if you have any of these conditions: Heart disease Liver disease Low white blood cell levels An unusual or allergic reaction to paclitaxel, other medications, foods,  dyes, or preservatives If you or your partner are pregnant or trying to get pregnant Breast-feeding How should I use this medication? This medication is injected into a vein. It is given by your care team in a hospital or clinic setting. Talk to your care team about the use of this medication in children. While it may be given to children for selected  conditions, precautions do apply. Overdosage: If you think you have taken too much of this medicine contact a poison control center or emergency room at once. NOTE: This medicine is only for you. Do not share this medicine with others. What if I miss a dose? Keep appointments for follow-up doses. It is important not to miss your dose. Call your care team if you are unable to keep an appointment. What may interact with this medication? Do not take this medication with any of the following: Live virus vaccines Other medications may affect the way this medication works. Talk with your care team about all of the medications you take. They may suggest changes to your treatment plan to lower the risk of side effects and to make sure your medications work as intended. This list may not describe all possible interactions. Give your health care provider a list of all the medicines, herbs, non-prescription drugs, or dietary supplements you use. Also tell them if you smoke, drink alcohol, or use illegal drugs. Some items may interact with your medicine. What should I watch for while using this medication? Your condition will be monitored carefully while you are receiving this medication. You may need blood work while taking this medication. This medication may make you feel generally unwell. This is not uncommon as chemotherapy can affect healthy cells as well as cancer cells. Report any side effects. Continue your course of treatment even though you feel ill unless your care team tells you to stop. This medication can cause serious allergic reactions. To reduce the risk, your care team may give you other medications to take before receiving this one. Be sure to follow the directions from your care team. This medication may increase your risk of getting an infection. Call your care team for advice if you get a fever, chills, sore throat, or other symptoms of a cold or flu. Do not treat yourself. Try to avoid  being around people who are sick. This medication may increase your risk to bruise or bleed. Call your care team if you notice any unusual bleeding. Be careful brushing or flossing your teeth or using a toothpick because you may get an infection or bleed more easily. If you have any dental work done, tell your dentist you are receiving this medication. Talk to your care team if you may be pregnant. Serious birth defects can occur if you take this medication during pregnancy. Talk to your care team before breastfeeding. Changes to your treatment plan may be needed. What side effects may I notice from receiving this medication? Side effects that you should report to your care team as soon as possible: Allergic reactions--skin rash, itching, hives, swelling of the face, lips, tongue, or throat Heart rhythm changes--fast or irregular heartbeat, dizziness, feeling faint or lightheaded, chest pain, trouble breathing Increase in blood pressure Infection--fever, chills, cough, sore throat, wounds that don't heal, pain or trouble when passing urine, general feeling of discomfort or being unwell Low blood pressure--dizziness, feeling faint or lightheaded, blurry vision Low red blood cell level--unusual weakness or fatigue, dizziness, headache, trouble breathing Painful swelling, warmth, or redness of  the skin, blisters or sores at the infusion site Pain, tingling, or numbness in the hands or feet Slow heartbeat--dizziness, feeling faint or lightheaded, confusion, trouble breathing, unusual weakness or fatigue Unusual bruising or bleeding Side effects that usually do not require medical attention (report to your care team if they continue or are bothersome): Diarrhea Hair loss Joint pain Loss of appetite Muscle pain Nausea Vomiting This list may not describe all possible side effects. Call your doctor for medical advice about side effects. You may report side effects to FDA at 1-800-FDA-1088. Where  should I keep my medication? This medication is given in a hospital or clinic. It will not be stored at home. NOTE: This sheet is a summary. It may not cover all possible information. If you have questions about this medicine, talk to your doctor, pharmacist, or health care provider.  2024 Elsevier/Gold Standard (2022-03-14 00:00:00)

## 2023-10-10 NOTE — Progress Notes (Signed)
Patient Care Team: [name], [name] as PCP - General (Physician Assistant) Obgyn, Ma Hillock as Consulting Physician (Obstetrics and Gynecology) Donalee Citrin, MD as Consulting Physician (Neurosurgery) Donnelly Angelica, RN as Oncology Nurse Navigator Pershing Proud, RN as Oncology Nurse Navigator Serena Croissant, MD as Consulting Physician (Hematology and Oncology) Almond Lint, MD as Consulting Physician (General Surgery) Lonie Peak, MD as Attending Physician (Radiation Oncology)  DIAGNOSIS:  Encounter Diagnosis  Name Primary?   Malignant neoplasm of upper-inner quadrant of left breast in female, estrogen receptor positive (HCC) Yes    SUMMARY OF ONCOLOGIC HISTORY: Oncology History  Malignant neoplasm of upper-inner quadrant of left female breast (HCC)  07/19/2023 Initial Diagnosis   Palpable left breast mass for 6 months.  Mammogram and ultrasound revealed large masses.  10:30 position: 4.3 cm with skin thickening and ulceration: Grade 2 IDC with high-grade DCIS ER 95%, PR 95%, Ki67 30%, HER2 2+ by IHC, FISH negative ratio 1.37; 5 o'clock position: 3.3 cm with calcifications spanning 6.7 cm, axilla negative, biopsy: Grade 3 IDC with necrosis ER 95% PR 90% Ki-67 60% HER2 1+ negative   07/26/2023 Cancer Staging   Staging form: Breast, AJCC 8th Edition - Clinical: Stage IIIB (cT4b, cN0, cM0, G3, ER+, PR+, HER2-) - Signed by Serena Croissant, MD on 07/26/2023 Histologic grading system: 3 grade system   08/15/2023 -  Chemotherapy   Patient is on Treatment Plan : BREAST ADJUVANT DOSE DENSE AC q14d / PACLitaxel q7d     08/17/2023 Initial Biopsy   Liver biopsy: consistent with metastatic carcinoma, breast primary.     08/21/2023 Cancer Staging   Staging form: Breast, AJCC 8th Edition - Pathologic: Stage IV (pM1) - Signed by Loa Socks, NP on 08/21/2023     CHIEF COMPLIANT: Cycle 1 Taxol  HISTORY OF PRESENT ILLNESS:   History of Present Illness   The patient, with a  history of cancer, has completed four treatments of AC and is about to start a new treatment, Taxol. She has been experiencing fatigue, which she describes as the worst after the last treatment. She is aware that this fatigue may continue for a few more weeks into the new treatment. She has been randomized in a trial for cold and compression therapy, which she is excited about. She has been maintaining a good appetite and her weight has been stable. Her blood work is good, with normal white count, increased hemoglobin, and normal platelets. Her electrolytes and kidney function are also fine. A wound that was previously concerning has healed well, and she reports that the edges are smaller.         ALLERGIES:  has No Known Allergies.  MEDICATIONS:  Current Outpatient Medications  Medication Sig Dispense Refill   acetaminophen (TYLENOL) 500 MG tablet Take 500 mg by mouth every 6 (six) hours as needed for mild pain.     Cholecalciferol (VITAMIN D3) 5000 units CAPS Take 5,000 Units by mouth daily.     citalopram (CELEXA) 40 MG tablet Take 0.5 tablets (20 mg total) by mouth daily.     dexamethasone (DECADRON) 4 MG tablet Take 1 tablet by mouth the day after and 1 tablet 2 days after chemo 8 tablet 0   Ibuprofen 200 MG CAPS Take 400 mg by mouth daily as needed (Pain).     lidocaine-prilocaine (EMLA) cream Apply to affected area once 30 g 3   loratadine (CLARITIN) 10 MG tablet Take 10 mg by mouth daily. Liquid gel  LORazepam (ATIVAN) 0.5 MG tablet Take 1 tablet (0.5 mg total) by mouth 2 (two) times daily as needed for anxiety. 30 tablet 1   ondansetron (ZOFRAN) 8 MG tablet Take 1 tablet (8 mg total) by mouth every 8 (eight) hours as needed for nausea/vomiting. Start third day after doxorubicin/cyclophosphamide chemotherapy. 30 tablet 1   prochlorperazine (COMPAZINE) 10 MG tablet Take 1 tablet (10 mg total) by mouth every 6 (six) hours as needed for nausea or vomiting. 30 tablet 1    sennosides-docusate sodium (SENOKOT-S) 8.6-50 MG tablet Take 2 tablets by mouth daily.     No current facility-administered medications for this visit.    PHYSICAL EXAMINATION: ECOG PERFORMANCE STATUS: 1 - Symptomatic but completely ambulatory  Vitals:   10/10/23 0929  BP: 115/66  Pulse: 100  Resp: 18  Temp: (!) 97.5 F (36.4 C)  SpO2: 99%   Filed Weights   10/10/23 0929  Weight: 143 lb (64.9 kg)     LABORATORY DATA:  I have reviewed the data as listed    Latest Ref Rng & Units 10/10/2023    9:05 AM 09/26/2023    8:25 AM 09/12/2023    8:13 AM  CMP  Glucose 70 - 99 mg/dL 409  811  914   BUN 6 - 20 mg/dL 8  13  9    Creatinine 0.44 - 1.00 mg/dL 7.82  9.56  2.13   Sodium 135 - 145 mmol/L 141  135  137   Potassium 3.5 - 5.1 mmol/L 3.7  3.7  3.6   Chloride 98 - 111 mmol/L 106  103  104   CO2 22 - 32 mmol/L 28  26  25    Calcium 8.9 - 10.3 mg/dL 9.5  8.8  8.9   Total Protein 6.5 - 8.1 g/dL 6.8  6.5  6.6   Total Bilirubin <1.2 mg/dL 0.3  0.2  0.3   Alkaline Phos 38 - 126 U/L 103  103  100   AST 15 - 41 U/L 22  17  15    ALT 0 - 44 U/L 18  15  17      Lab Results  Component Value Date   WBC 9.8 10/10/2023   HGB 11.1 (L) 10/10/2023   HCT 34.1 (L) 10/10/2023   MCV 95.3 10/10/2023   PLT 268 10/10/2023   NEUTROABS 7.3 10/10/2023    ASSESSMENT & PLAN:  Malignant neoplasm of upper-inner quadrant of left female breast (HCC) 07/19/2023:Palpable left breast mass for 6 months.  Mammogram and ultrasound revealed large masses.   10:30 position: 4.3 cm with skin thickening and ulceration: Grade 2 IDC with high-grade DCIS ER 95%, PR 95%, Ki67 30%, HER2 2+ by IHC, FISH negative ratio 1.37;  5 o'clock position: 3.3 cm with calcifications spanning 6.7 cm, axilla negative, biopsy: Grade 3 IDC with necrosis ER 95% PR 90% Ki-67 60% HER2 1+ negative   CT CAP 08/03/2023: 2 prominent left breast masses, left axillary lymph nodes, right hepatic lobe lesion concerning for metastatic breast  cancer Ultrasound a liver biopsy scheduled for 08/17/2023   Treatment plan: Neoadjuvant chemotherapy with dose Adriamycin and Cytoxan followed by Taxol Mastectomy left breast Adjuvant radiation Antiestrogen therapy with CDK inhibitors -------------------------------------------------------------------------------------------------------------------------------------- Current treatment: Completed 4 cycles of dose dense Adriamycin and Cytoxan, today is cycle 1 Taxol   Liver biopsy  08/17/2023.  Metastatic breast cancer ER 90%, PR 90%, Ki-67 45%, HER2 0 The reason for using definitive chemotherapy is because she has solitary liver metastasis.   Chemo toxicities:  Tongue changes Fatigue Chemo induced anemia: Today's hemoglobin is better.  Ice compress study: Patient got randomized to compression and ice. Patient feels that the treatment has worked very well and the tissue has improved significantly and it has flattened out.  She cannot feel the lower breast mass.   Return to clinic weekly for Taxol and every other week for follow up with [place]  No orders of the defined types were placed in this encounter.  The patient has a good understanding of the overall plan. she agrees with it. she will call with any problems that may develop before the next visit here. Total time spent: 30 mins including face to face time and time spent for planning, charting and co-ordination of care   Tamsen Meek, MD 10/10/23

## 2023-10-11 ENCOUNTER — Telehealth: Payer: Self-pay

## 2023-10-11 NOTE — Telephone Encounter (Signed)
Ann Daugherty states that she is doing fine. She is eating, drinking, and urinating well. She knows to call the office at [phone] if  she has any questions or concerns.

## 2023-10-16 ENCOUNTER — Encounter: Payer: Self-pay | Admitting: Hematology and Oncology

## 2023-10-16 ENCOUNTER — Encounter: Payer: Self-pay | Admitting: *Deleted

## 2023-10-16 NOTE — Research (Signed)
LATE ENTRY   S2205, ICE COMPRESS: RANDOMIZED TRIAL OF LIMB CRYOCOMPRESSION VERSUS CONTINUOUS COMPRESSION VERSUS LOW CYCLIC COMPRESSION FOR THE PREVENTION  OF TAXANE-INDUCED PERIPHERAL NEUROPATHY  This Nurse met with Ann Daugherty and Ann Daugherty on 10/08/23 at 1000, where this patient was reviewed for eligibility, including inclusion and exclusion criteria. This RN confirms Ann Daugherty is eligible for study participation.  Patient may continue with enrollment.   [name], RN, BSN, [facility]  [facility]  [facility] Clinical Research Nurse [facility] Direct Dial [phone] 10/16/2023 11:16 AM

## 2023-10-17 ENCOUNTER — Encounter: Payer: Self-pay | Admitting: Hematology and Oncology

## 2023-10-17 ENCOUNTER — Inpatient Hospital Stay: Payer: Medicaid Other

## 2023-10-17 ENCOUNTER — Inpatient Hospital Stay (HOSPITAL_BASED_OUTPATIENT_CLINIC_OR_DEPARTMENT_OTHER): Payer: Medicaid Other | Admitting: Adult Health

## 2023-10-17 ENCOUNTER — Other Ambulatory Visit (HOSPITAL_COMMUNITY): Payer: Self-pay

## 2023-10-17 ENCOUNTER — Other Ambulatory Visit: Payer: Self-pay

## 2023-10-17 VITALS — BP 114/70 | HR 98 | Temp 99.0°F | Resp 18 | Ht 66.0 in | Wt 141.4 lb

## 2023-10-17 DIAGNOSIS — Z170 Estrogen receptor positive status [ER+]: Secondary | ICD-10-CM | POA: Diagnosis not present

## 2023-10-17 DIAGNOSIS — C50212 Malignant neoplasm of upper-inner quadrant of left female breast: Secondary | ICD-10-CM

## 2023-10-17 DIAGNOSIS — C787 Secondary malignant neoplasm of liver and intrahepatic bile duct: Secondary | ICD-10-CM | POA: Diagnosis not present

## 2023-10-17 DIAGNOSIS — Z5111 Encounter for antineoplastic chemotherapy: Secondary | ICD-10-CM | POA: Diagnosis not present

## 2023-10-17 DIAGNOSIS — Z95828 Presence of other vascular implants and grafts: Secondary | ICD-10-CM

## 2023-10-17 DIAGNOSIS — Z87891 Personal history of nicotine dependence: Secondary | ICD-10-CM | POA: Diagnosis not present

## 2023-10-17 LAB — CBC WITH DIFFERENTIAL (CANCER CENTER ONLY)
Abs Immature Granulocytes: 0.05 10*3/uL (ref 0.00–0.07)
Basophils Absolute: 0.1 10*3/uL (ref 0.0–0.1)
Basophils Relative: 1 %
Eosinophils Absolute: 0 10*3/uL (ref 0.0–0.5)
Eosinophils Relative: 0 %
HCT: 29.6 % — ABNORMAL LOW (ref 36.0–46.0)
Hemoglobin: 9.9 g/dL — ABNORMAL LOW (ref 12.0–15.0)
Immature Granulocytes: 1 %
Lymphocytes Relative: 21 %
Lymphs Abs: 1.1 10*3/uL (ref 0.7–4.0)
MCH: 31.3 pg (ref 26.0–34.0)
MCHC: 33.4 g/dL (ref 30.0–36.0)
MCV: 93.7 fL (ref 80.0–100.0)
Monocytes Absolute: 0.7 10*3/uL (ref 0.1–1.0)
Monocytes Relative: 13 %
Neutro Abs: 3.3 10*3/uL (ref 1.7–7.7)
Neutrophils Relative %: 64 %
Platelet Count: 474 10*3/uL — ABNORMAL HIGH (ref 150–400)
RBC: 3.16 MIL/uL — ABNORMAL LOW (ref 3.87–5.11)
RDW: 19.4 % — ABNORMAL HIGH (ref 11.5–15.5)
WBC Count: 5.2 10*3/uL (ref 4.0–10.5)
nRBC: 0 % (ref 0.0–0.2)

## 2023-10-17 LAB — CMP (CANCER CENTER ONLY)
ALT: 21 U/L (ref 0–44)
AST: 23 U/L (ref 15–41)
Albumin: 4.5 g/dL (ref 3.5–5.0)
Alkaline Phosphatase: 83 U/L (ref 38–126)
Anion gap: 8 (ref 5–15)
BUN: 5 mg/dL — ABNORMAL LOW (ref 6–20)
CO2: 26 mmol/L (ref 22–32)
Calcium: 9.3 mg/dL (ref 8.9–10.3)
Chloride: 106 mmol/L (ref 98–111)
Creatinine: 0.54 mg/dL (ref 0.44–1.00)
GFR, Estimated: >60 mL/min (ref >60)
Glucose, Bld: 90 mg/dL (ref 70–99)
Potassium: 3.8 mmol/L (ref 3.5–5.1)
Sodium: 140 mmol/L (ref 135–145)
Total Bilirubin: 0.2 mg/dL (ref <1.2)
Total Protein: 7.3 g/dL (ref 6.5–8.1)

## 2023-10-17 MED ORDER — SODIUM CHLORIDE 0.9 % IV SOLN: Freq: Once | INTRAVENOUS | Status: AC

## 2023-10-17 MED ORDER — SODIUM CHLORIDE 0.9% FLUSH
10.0000 mL | INTRAVENOUS | Status: DC | PRN
  Administered 2023-10-17: 10 mL

## 2023-10-17 MED ORDER — DIPHENHYDRAMINE HCL 50 MG/ML IJ SOLN
25.0000 mg | Freq: Once | INTRAMUSCULAR | Status: AC
  Administered 2023-10-17: 25 mg via INTRAVENOUS
  Filled 2023-10-17: qty 1

## 2023-10-17 MED ORDER — HEPARIN SOD (PORK) LOCK FLUSH 100 UNIT/ML IV SOLN
500.0000 [IU] | Freq: Once | INTRAVENOUS | Status: AC | PRN
  Administered 2023-10-17: 500 [IU]

## 2023-10-17 MED ORDER — OMEPRAZOLE 40 MG PO CPDR
40.0000 mg | DELAYED_RELEASE_CAPSULE | Freq: Two times a day (BID) | ORAL | 1 refills | Status: DC
  Filled 2023-10-17: qty 60, 30d supply, fill #0

## 2023-10-17 MED ORDER — OMEPRAZOLE 40 MG PO CPDR
40.0000 mg | DELAYED_RELEASE_CAPSULE | Freq: Two times a day (BID) | ORAL | 1 refills | Status: DC
  Filled 2023-10-17: qty 30, 15d supply, fill #0

## 2023-10-17 MED ORDER — FAMOTIDINE IN NACL 20-0.9 MG/50ML-% IV SOLN
20.0000 mg | Freq: Once | INTRAVENOUS | Status: AC
  Administered 2023-10-17: 20 mg via INTRAVENOUS
  Filled 2023-10-17: qty 50

## 2023-10-17 MED ORDER — PACLITAXEL CHEMO INJECTION 300 MG/50ML
65.0000 mg/m2 | Freq: Once | INTRAVENOUS | Status: AC
  Administered 2023-10-17: 114 mg via INTRAVENOUS
  Filled 2023-10-17: qty 19

## 2023-10-17 MED ORDER — DEXAMETHASONE SODIUM PHOSPHATE 10 MG/ML IJ SOLN
10.0000 mg | Freq: Once | INTRAMUSCULAR | Status: AC
  Administered 2023-10-17: 10 mg via INTRAVENOUS
  Filled 2023-10-17: qty 1

## 2023-10-17 MED ORDER — SODIUM CHLORIDE 0.9% FLUSH
10.0000 mL | Freq: Once | INTRAVENOUS | Status: AC
  Administered 2023-10-17: 10 mL

## 2023-10-17 NOTE — Patient Instructions (Signed)
 [facility] - A DEPT OF [facility]  Discharge Instructions: Thank you for choosing [facility] to provide your oncology and hematology care.   If you have a lab appointment with the Cancer Center, please go directly to the Cancer Center and check in at the registration area.   Wear comfortable clothing and clothing appropriate for easy access to any Portacath or PICC line.   We strive to give you quality time with your provider. You may need to reschedule your appointment if you arrive late (15 or more minutes).  Arriving late affects you and other patients whose appointments are after yours.  Also, if you miss three or more appointments without notifying the office, you may be dismissed from the clinic at the provider's discretion.      For prescription refill requests, have your pharmacy contact our office and allow 72 hours for refills to be completed.    Today you received the following chemotherapy and/or immunotherapy agents: Paclitaxel      To help prevent nausea and vomiting after your treatment, we encourage you to take your nausea medication as directed.  BELOW ARE SYMPTOMS THAT SHOULD BE REPORTED IMMEDIATELY: *FEVER GREATER THAN 100.4 F (38 C) OR HIGHER *CHILLS OR SWEATING *NAUSEA AND VOMITING THAT IS NOT CONTROLLED WITH YOUR NAUSEA MEDICATION *UNUSUAL SHORTNESS OF BREATH *UNUSUAL BRUISING OR BLEEDING *URINARY PROBLEMS (pain or burning when urinating, or frequent urination) *BOWEL PROBLEMS (unusual diarrhea, constipation, pain near the anus) TENDERNESS IN MOUTH AND THROAT WITH OR WITHOUT PRESENCE OF ULCERS (sore throat, sores in mouth, or a toothache) UNUSUAL RASH, SWELLING OR PAIN  UNUSUAL VAGINAL DISCHARGE OR ITCHING   Items with * indicate a potential emergency and should be followed up as soon as possible or go to the Emergency Department if any problems should occur.  Please show the CHEMOTHERAPY ALERT CARD or IMMUNOTHERAPY  ALERT CARD at check-in to the Emergency Department and triage nurse.  Should you have questions after your visit or need to cancel or reschedule your appointment, please contact [facility] - A DEPT OF [facility]  Dept: [phone]  and follow the prompts.  Office hours are 8:00 a.m. to 4:30 p.m. Monday - Friday. Please note that voicemails left after 4:00 p.m. may not be returned until the following business day.  We are closed weekends and major holidays. You have access to a nurse at all times for urgent questions. Please call the main number to the clinic Dept: [phone] and follow the prompts.   For any non-urgent questions, you may also contact your provider using MyChart. We now offer e-Visits for anyone 89 and older to request care online for non-urgent symptoms. For details visit [email or web address].   Also download the MyChart app! Go to the app store, search "MyChart", open the app, select [facility], and log in with your MyChart username and password.  Paclitaxel Injection What is this medication? PACLITAXEL (PAK li TAX el) treats some types of cancer. It works by slowing down the growth of cancer cells. This medicine may be used for other purposes; ask your health care provider or pharmacist if you have questions. COMMON BRAND NAME(S): Onxol, Taxol What should I tell my care team before I take this medication? They need to know if you have any of these conditions: Heart disease Liver disease Low white blood cell levels An unusual or allergic reaction to paclitaxel, other medications, foods,  dyes, or preservatives If you or your partner are pregnant or trying to get pregnant Breast-feeding How should I use this medication? This medication is injected into a vein. It is given by your care team in a hospital or clinic setting. Talk to your care team about the use of this medication in children. While it may be given to children for selected  conditions, precautions do apply. Overdosage: If you think you have taken too much of this medicine contact a poison control center or emergency room at once. NOTE: This medicine is only for you. Do not share this medicine with others. What if I miss a dose? Keep appointments for follow-up doses. It is important not to miss your dose. Call your care team if you are unable to keep an appointment. What may interact with this medication? Do not take this medication with any of the following: Live virus vaccines Other medications may affect the way this medication works. Talk with your care team about all of the medications you take. They may suggest changes to your treatment plan to lower the risk of side effects and to make sure your medications work as intended. This list may not describe all possible interactions. Give your health care provider a list of all the medicines, herbs, non-prescription drugs, or dietary supplements you use. Also tell them if you smoke, drink alcohol, or use illegal drugs. Some items may interact with your medicine. What should I watch for while using this medication? Your condition will be monitored carefully while you are receiving this medication. You may need blood work while taking this medication. This medication may make you feel generally unwell. This is not uncommon as chemotherapy can affect healthy cells as well as cancer cells. Report any side effects. Continue your course of treatment even though you feel ill unless your care team tells you to stop. This medication can cause serious allergic reactions. To reduce the risk, your care team may give you other medications to take before receiving this one. Be sure to follow the directions from your care team. This medication may increase your risk of getting an infection. Call your care team for advice if you get a fever, chills, sore throat, or other symptoms of a cold or flu. Do not treat yourself. Try to avoid  being around people who are sick. This medication may increase your risk to bruise or bleed. Call your care team if you notice any unusual bleeding. Be careful brushing or flossing your teeth or using a toothpick because you may get an infection or bleed more easily. If you have any dental work done, tell your dentist you are receiving this medication. Talk to your care team if you may be pregnant. Serious birth defects can occur if you take this medication during pregnancy. Talk to your care team before breastfeeding. Changes to your treatment plan may be needed. What side effects may I notice from receiving this medication? Side effects that you should report to your care team as soon as possible: Allergic reactions--skin rash, itching, hives, swelling of the face, lips, tongue, or throat Heart rhythm changes--fast or irregular heartbeat, dizziness, feeling faint or lightheaded, chest pain, trouble breathing Increase in blood pressure Infection--fever, chills, cough, sore throat, wounds that don't heal, pain or trouble when passing urine, general feeling of discomfort or being unwell Low blood pressure--dizziness, feeling faint or lightheaded, blurry vision Low red blood cell level--unusual weakness or fatigue, dizziness, headache, trouble breathing Painful swelling, warmth, or redness of  the skin, blisters or sores at the infusion site Pain, tingling, or numbness in the hands or feet Slow heartbeat--dizziness, feeling faint or lightheaded, confusion, trouble breathing, unusual weakness or fatigue Unusual bruising or bleeding Side effects that usually do not require medical attention (report to your care team if they continue or are bothersome): Diarrhea Hair loss Joint pain Loss of appetite Muscle pain Nausea Vomiting This list may not describe all possible side effects. Call your doctor for medical advice about side effects. You may report side effects to FDA at 1-800-FDA-1088. Where  should I keep my medication? This medication is given in a hospital or clinic. It will not be stored at home. NOTE: This sheet is a summary. It may not cover all possible information. If you have questions about this medicine, talk to your doctor, pharmacist, or health care provider.  2024 [employer]vier/Gold Standard (2022-03-14 00:00:00)

## 2023-10-17 NOTE — Research (Addendum)
 L8756, ICE COMPRESS: RANDOMIZED TRIAL OF LIMB CRYOCOMPRESSION VERSUS CONTINUOUS COMPRESSION VERSUS LOW CYCLIC COMPRESSION FOR THE PREVENTION  OF TAXANE-INDUCED PERIPHERAL NEUROPATHY   Patient arrives today Unaccompanied for the cycle 2 visit. Confirmed patient does not have wounds, sores, or lesions to extremities. Patient has not had any vaccinations since last visit.   ADVERSE EVENTS: Patient reports no adverse events from study intervention (Paxman machine). Pt denies any neuropathy symptoms. She did report fatigue and diarrhea with today's visit.    SOLICITED ADVERSE EVENTS:    Adverse Event CTCAE Grade Relationship to Study Intervention Action Taken Comments  Skin atrophy 0 NA      Skin hyperpigmentation 0 NA      Skin hypopigmentation 0 NA      Skin induration 0 NA      Skin ulceration 0 NA      Rash maculopapular 0 NA      Nail changes 0 NA      Cold intolerance (general disorders and administration site conditions- other) 0 NA      Frostbite (skin and subcutaneous tissue disorders- other) 0 NA      Diarrhea 2 Not related to paxman machine none Reports frequent diarrhea with last treatment, resolved two days ago  Fatigue 3 Not related to paxman  machine none Reported 6 days of fatigue following treatment. Pt could not stay awake for infusion today. Started date last infusion.       STUDY INTERVENTION & TOLERABILITY ASSESSMENTS: 12:06 pm- Wraps applied; pre-treatment started. Starting temperature 11 degrees Celsius. Starting pressure 5-15 mmHG. 12:16 pm- Required 5-10 minute Tolerability check- pt states tolerable to cooling and compression 12:39 pm- Taxane infusion start time. Moved to treatment phase.  1:06 pm- Required 60 minute Tolerability check- pt states tolerable to cooling and compression 1:41 pm- Taxane infusion end time. Moved to post-treatment phase.  2:06 pm- Required 60 minute Tolerability check- pt states tolerable to cooling and compression 2:11 pm- Study  intervention end time. Confirmed no new wounds, sores, or lesions.   DISPOSITION: Upon completion off all study requirements, patient remained in infusion chair as infusion RN was deaccessing pt port. Pt understands that research will meet her with cycle 3 that is scheduled for 10-24-23.    The patient was thanked for their time and continued voluntary participation in this study. [name] has been provided direct contact information and is encouraged to contact this Nurse for any needs or questions.  Zerita Boers BSN RN Clinical Research Nurse [facility] Direct Dial: [phone] 10/18/2023  10:32 AM

## 2023-10-18 ENCOUNTER — Encounter: Payer: Self-pay | Admitting: Adult Health

## 2023-10-18 ENCOUNTER — Other Ambulatory Visit: Payer: Self-pay | Admitting: Hematology and Oncology

## 2023-10-18 ENCOUNTER — Encounter: Payer: Self-pay | Admitting: Hematology and Oncology

## 2023-10-18 DIAGNOSIS — C50212 Malignant neoplasm of upper-inner quadrant of left female breast: Secondary | ICD-10-CM

## 2023-10-18 NOTE — Progress Notes (Signed)
[facility] Cancer Follow up:    [name], Ann Daugherty [id number] [place] [address]   DIAGNOSIS:  Cancer Staging  Malignant neoplasm of upper-inner quadrant of left female breast [facility]) Staging form: Breast, AJCC 8th Edition - Clinical: Stage IIIB (cT4b, cN0, cM0, G3, ER+, PR+, HER2-) - Signed by Serena Croissant, MD on 07/26/2023 Histologic grading system: 3 grade system - Pathologic: Stage IV (pM1) - Signed by Loa Socks, NP on 08/21/2023   SUMMARY OF ONCOLOGIC HISTORY: Oncology History  Malignant neoplasm of upper-inner quadrant of left female breast (HCC)  07/19/2023 Initial Diagnosis   Palpable left breast mass for 6 months.  Mammogram and ultrasound revealed large masses.  10:30 position: 4.3 cm with skin thickening and ulceration: Grade 2 IDC with high-grade DCIS ER 95%, PR 95%, Ki67 30%, HER2 2+ by IHC, FISH negative ratio 1.37; 5 o'clock position: 3.3 cm with calcifications spanning 6.7 cm, axilla negative, biopsy: Grade 3 IDC with necrosis ER 95% PR 90% Ki-67 60% HER2 1+ negative   07/26/2023 Cancer Staging   Staging form: Breast, AJCC 8th Edition - Clinical: Stage IIIB (cT4b, cN0, cM0, G3, ER+, PR+, HER2-) - Signed by Serena Croissant, MD on 07/26/2023 Histologic grading system: 3 grade system   08/15/2023 -  Chemotherapy   Patient is on Treatment Plan : BREAST ADJUVANT DOSE DENSE AC q14d / PACLitaxel q7d     08/17/2023 Initial Biopsy   Liver biopsy: consistent with metastatic carcinoma, breast primary.     08/21/2023 Cancer Staging   Staging form: Breast, AJCC 8th Edition - Pathologic: Stage IV (pM1) - Signed by Loa Socks, NP on 08/21/2023     CURRENT THERAPY: Taxol  INTERVAL HISTORY:  Discussed the use of AI scribe software for clinical note transcription with the patient, who gave verbal consent to proceed.  Ann Daugherty 52 y.o. female with breast cancer is here for evaluation and f/u after receiving her first  dose of weekly Taxol last week.  She reports diarrhea, heartburn, and oral discomfort, described as a burning sensation and unusual taste. The heartburn was severe enough to cause jaw pain and was not fully relieved by Pepcid. The patient also reports fatigue, chills, and ear pain, which they manage by wearing a hat and a turtle fur. They describe a sensation of a vice around the back of their head and ears when not wearing the hat.  The patient also reports musculoskeletal discomfort, including aching in the knees and hips, and back pain. They have had a significant decrease in appetite and changes in taste perception, with a preference for spicy foods. However, spicy foods exacerbate their heartburn.  The patient also mentions a significant personal event, a recent breakup, which may be contributing to their overall stress and health status.  In addition to these symptoms, the patient reports a noticeable reduction in the size of her breast tumor, which is no longer bleeding or ulcerated.  The patient's symptoms have been severe enough to impact their daily life significantly, leading to frustration and a feeling of being a 'complainer.' Despite these challenges, the patient remains engaged in their care and is open to medication adjustments to manage their symptoms.   Patient Active Problem List   Diagnosis Date Noted   Port-A-Cath in place 08/15/2023   Malignant neoplasm of upper-inner quadrant of left female breast (HCC) 07/20/2023   Depression, major, single episode, mild (HCC) 04/05/2021   Tobacco abuse 01/30/2018   Anxiety 01/30/2018  Attention deficit disorder 01/30/2018   Vitamin D deficiency 01/30/2018    has no known allergies.  MEDICAL HISTORY: Past Medical History:  Diagnosis Date   ADD (attention deficit disorder) 2015   Does not take any medication   Anxiety    Cancer (HCC)    breast   Depression    GERD (gastroesophageal reflux disease)    History of chicken pox     PONV (postoperative nausea and vomiting)     SURGICAL HISTORY: Past Surgical History:  Procedure Laterality Date   BACK SURGERY     BREAST BIOPSY Left 07/19/2023   [place] LT BREAST BX W LOC DEV EA ADD LESION IMG BX SPEC US GUIDE 07/19/2023 [facility]   BREAST BIOPSY Left 07/19/2023   [place] LT BREAST BX W LOC DEV 1ST LESION IMG BX SPEC US GUIDE 07/19/2023 [facility]   BREAST BIOPSY Right 08/09/2023   [place] RT BREAST BX W LOC DEV 1ST LESION IMG BX SPEC US GUIDE 08/09/2023 [facility]   BREAST BIOPSY Right 08/09/2023   [place] RT BREAST BX W LOC DEV EA ADD LESION IMG BX SPEC US GUIDE 08/09/2023 [facility]   LAMINECTOMY     2007, 2011   PORTACATH PLACEMENT Right 08/14/2023   Procedure: INSERTION PORT-A-CATH WITH ULTRASOUND GUIDEANCE;  Surgeon: Almond Lint, MD;  Location: [facility] OR;  Service: General;  Laterality: Right;   VAGINAL DELIVERY  2004, 2006    SOCIAL HISTORY: Social History   Socioeconomic History   Marital status: Divorced    Spouse name: Not on file   Number of children: 2   Years of education: Not on file   Highest education level: Not on file  Occupational History   Not on file  Tobacco Use   Smoking status: Former    Current packs/day: 0.00    Average packs/day: 0.5 packs/day for 30.0 years (15.0 ttl pk-yrs)    Types: Cigarettes    Start date: 04/07/1991    Quit date: 04/06/2021    Years since quitting: 2.5   Smokeless tobacco: Current  Vaping Use   Vaping status: Every Day   Substances: Nicotine  Substance and Sexual Activity   Alcohol use: Yes    Comment: 3-4 drinks a year   Drug use: No   Sexual activity: Not Currently    Birth control/protection: None  Other Topics Concern   Not on file  Social History Narrative   61 and 67 y/o sons   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   Social Drivers of Health   Financial Resource Strain: Not on file  Food Insecurity: Food Insecurity Present (07/27/2023)   Hunger Vital Sign    Worried About Running Out  of Food in the Last Year: Sometimes true    Ran Out of Food in the Last Year: Sometimes true  Transportation Needs: No Transportation Needs (07/27/2023)   PRAPARE - Administrator, Civil Service (Medical): No    Lack of Transportation (Non-Medical): No  Physical Activity: Not on file  Stress: Not on file  Social Connections: Not on file  Intimate Partner Violence: Not At Risk (07/27/2023)   Humiliation, Afraid, Rape, and Kick questionnaire    Fear of Current or Ex-Partner: No    Emotionally Abused: No    Physically Abused: No    Sexually Abused: No    FAMILY HISTORY: Family History  Problem Relation Age of Onset   Breast cancer Mother 31   Colon cancer Mother 16  Asthma Mother    Hypertension Mother    Hearing loss Mother    Hypertension Father    Diabetes Father    Hyperlipidemia Father    Learning disabilities Son    ADD / ADHD Son    Ovarian cancer Maternal Grandmother    Hypertension Maternal Grandfather    Hyperlipidemia Maternal Grandfather    Heart disease Maternal Grandfather    Stroke Maternal Grandfather    Mental illness Paternal Grandmother    Hypertension Paternal Grandfather     Review of Systems  Constitutional:  Positive for fatigue. Negative for appetite change, chills, fever and unexpected weight change.  HENT:   Negative for hearing loss, lump/mass and trouble swallowing.   Eyes:  Negative for eye problems and icterus.  Respiratory:  Negative for chest tightness, cough and shortness of breath.   Cardiovascular:  Negative for chest pain, leg swelling and palpitations.  Gastrointestinal:  Positive for diarrhea. Negative for abdominal distention, abdominal pain, constipation, nausea and vomiting.  Endocrine: Negative for hot flashes.  Genitourinary:  Negative for difficulty urinating.   Musculoskeletal:  Negative for arthralgias.  Skin:  Negative for itching and rash.  Neurological:  Negative for dizziness, extremity weakness, headaches and  numbness.  Hematological:  Negative for adenopathy. Does not bruise/bleed easily.  Psychiatric/Behavioral:  Negative for depression. The patient is not nervous/anxious.       PHYSICAL EXAMINATION   Onc Performance Status - 10/17/23 1103       ECOG Perf Status   ECOG Perf Status Fully active, able to carry on all pre-disease performance without restriction      KPS SCALE   KPS % SCORE Normal, no compliants, no evidence of disease             Vitals:   10/17/23 1057  BP: 114/70  Pulse: 98  Resp: 18  Temp: 99 F (37.2 C)  SpO2: 99%    Physical Exam Constitutional:      General: She is not in acute distress.    Appearance: Normal appearance. She is not toxic-appearing.  HENT:     Head: Normocephalic and atraumatic.     Mouth/Throat:     Mouth: Mucous membranes are moist.     Pharynx: Oropharynx is clear. No oropharyngeal exudate or posterior oropharyngeal erythema.  Eyes:     General: No scleral icterus. Cardiovascular:     Rate and Rhythm: Normal rate and regular rhythm.     Pulses: Normal pulses.     Heart sounds: Normal heart sounds.  Pulmonary:     Effort: Pulmonary effort is normal.     Breath sounds: Normal breath sounds.  Abdominal:     General: Abdomen is flat. Bowel sounds are normal. There is no distension.     Palpations: Abdomen is soft.     Tenderness: There is no abdominal tenderness.  Musculoskeletal:        General: No swelling.     Cervical back: Neck supple.  Lymphadenopathy:     Cervical: No cervical adenopathy.  Skin:    General: Skin is warm and dry.     Findings: No rash.  Neurological:     General: No focal deficit present.     Mental Status: She is alert.  Psychiatric:        Mood and Affect: Mood normal.        Behavior: Behavior normal.     LABORATORY DATA:  CBC    Component Value Date/Time   WBC 5.2 10/17/2023  1013   WBC 7.2 08/17/2023 1115   RBC 3.16 (L) 10/17/2023 1013   HGB 9.9 (L) 10/17/2023 1013   HCT 29.6  (L) 10/17/2023 1013   PLT 474 (H) 10/17/2023 1013   MCV 93.7 10/17/2023 1013   MCH 31.3 10/17/2023 1013   MCHC 33.4 10/17/2023 1013   RDW 19.4 (H) 10/17/2023 1013   LYMPHSABS 1.1 10/17/2023 1013   MONOABS 0.7 10/17/2023 1013   EOSABS 0.0 10/17/2023 1013   BASOSABS 0.1 10/17/2023 1013    CMP     Component Value Date/Time   NA 140 10/17/2023 1013   K 3.8 10/17/2023 1013   CL 106 10/17/2023 1013   CO2 26 10/17/2023 1013   GLUCOSE 90 10/17/2023 1013   BUN 5 (L) 10/17/2023 1013   CREATININE 0.54 10/17/2023 1013   CREATININE 0.63 08/09/2020 1032   CALCIUM 9.3 10/17/2023 1013   PROT 7.3 10/17/2023 1013   ALBUMIN 4.5 10/17/2023 1013   AST 23 10/17/2023 1013   ALT 21 10/17/2023 1013   ALKPHOS 83 10/17/2023 1013   BILITOT 0.2 10/17/2023 1013   GFRNONAA >60 10/17/2023 1013       PENDING LABS:   RADIOGRAPHIC STUDIES:  No results found.   PATHOLOGY:     ASSESSMENT and THERAPY PLAN:   Malignant neoplasm of upper-inner quadrant of left female breast (HCC) 07/19/2023:Palpable left breast mass for 6 months.  Mammogram and ultrasound revealed large masses.   10:30 position: 4.3 cm with skin thickening and ulceration: Grade 2 IDC with high-grade DCIS ER 95%, PR 95%, Ki67 30%, HER2 2+ by IHC, FISH negative ratio 1.37;  5 o'clock position: 3.3 cm with calcifications spanning 6.7 cm, axilla negative, biopsy: Grade 3 IDC with necrosis ER 95% PR 90% Ki-67 60% HER2 1+ negative   CT CAP 08/03/2023: 2 prominent left breast masses, left axillary lymph nodes, right hepatic lobe lesion concerning for metastatic breast cancer Ultrasound a liver biopsy scheduled for 08/17/2023   Treatment plan: Neoadjuvant chemotherapy with dose Adriamycin and Cytoxan followed by Taxol Mastectomy left breast Adjuvant radiation Antiestrogen therapy with CDK  inhibitors -------------------------------------------------------------------------------------------------------------------------------------- Current treatment: Completed 4 cycles of dose dense Adriamycin and Cytoxan, today is cycle 2 Taxol   Liver biopsy  08/17/2023.  Metastatic breast cancer ER 90%, PR 90%, Ki-67 45%, HER2 0 The reason for using definitive chemotherapy is because she has solitary liver metastasis.  Gastroesophageal Reflux Disease (GERD) Severe heartburn with radiation to the jaw. Over-the-counter Pepcid provided minimal relief. Risk of developing an ulcer if left untreated. -Start Omeprazole twice daily for one week, then reduce to once daily.  Oral Pain Severe tongue pain described as a burning sensation. -Continue Biotin.  Diarrhea Daily episodes with significant odor. -Advise to take Imodium if diarrhea persists.  Cancer Treatment Side Effects Experiencing multiple side effects from ongoing cancer treatment. Labs today are good with slightly lower hemoglobin and slightly elevated platelets. White blood cells, kidneys, liver, and electrolytes are all within normal limits. -Reduce treatment dose for today to potentially lessen side effects to 65 mg/m2.  Reviewed with Dr. Pamelia Hoit the above and he is in agreement.    RTC in 1 week for labs, f/u, and her next treatment.    All questions were answered. The patient knows to call the clinic with any problems, questions or concerns. We can certainly see the patient much sooner if necessary.  Total encounter time:20 minutes*in face-to-face visit time, chart review, lab review, care coordination, order entry, and documentation of the encounter time.  Lillard Anes, NP 10/18/23 10:45 AM Medical Oncology and Hematology [facility] [address] [place], [place] [address] Tel. [phone]    Fax. [phone]  *Total Encounter Time as defined by the Centers for Medicare and Medicaid Services  includes, in addition to the face-to-face time of a patient visit (documented in the note above) non-face-to-face time: obtaining and reviewing outside history, ordering and reviewing medications, tests or procedures, care coordination (communications with other health care professionals or caregivers) and documentation in the medical record.

## 2023-10-18 NOTE — Assessment & Plan Note (Signed)
07/19/2023:Palpable left breast mass for 6 months.  Mammogram and ultrasound revealed large masses.   10:30 position: 4.3 cm with skin thickening and ulceration: Grade 2 IDC with high-grade DCIS ER 95%, PR 95%, Ki67 30%, HER2 2+ by IHC, FISH negative ratio 1.37;  5 o'clock position: 3.3 cm with calcifications spanning 6.7 cm, axilla negative, biopsy: Grade 3 IDC with necrosis ER 95% PR 90% Ki-67 60% HER2 1+ negative   CT CAP 08/03/2023: 2 prominent left breast masses, left axillary lymph nodes, right hepatic lobe lesion concerning for metastatic breast cancer Ultrasound a liver biopsy scheduled for 08/17/2023   Treatment plan: Neoadjuvant chemotherapy with dose Adriamycin and Cytoxan followed by Taxol Mastectomy left breast Adjuvant radiation Antiestrogen therapy with CDK inhibitors -------------------------------------------------------------------------------------------------------------------------------------- Current treatment: Completed 4 cycles of dose dense Adriamycin and Cytoxan, today is cycle 2 Taxol   Liver biopsy  08/17/2023.  Metastatic breast cancer ER 90%, PR 90%, Ki-67 45%, HER2 0 The reason for using definitive chemotherapy is because she has solitary liver metastasis.  Gastroesophageal Reflux Disease (GERD) Severe heartburn with radiation to the jaw. Over-the-counter Pepcid provided minimal relief. Risk of developing an ulcer if left untreated. -Start Omeprazole twice daily for one week, then reduce to once daily.  Oral Pain Severe tongue pain described as a burning sensation. -Continue Biotin.  Diarrhea Daily episodes with significant odor. -Advise to take Imodium if diarrhea persists.  Cancer Treatment Side Effects Experiencing multiple side effects from ongoing cancer treatment. Labs today are good with slightly lower hemoglobin and slightly elevated platelets. White blood cells, kidneys, liver, and electrolytes are all within normal limits. -Reduce treatment dose  for today to potentially lessen side effects to 65 mg/m2.  Reviewed with Dr. Pamelia Hoit the above and he is in agreement.    RTC in 1 week for labs, f/u, and her next treatment.

## 2023-10-19 ENCOUNTER — Encounter: Payer: Self-pay | Admitting: Hematology and Oncology

## 2023-10-20 ENCOUNTER — Other Ambulatory Visit: Payer: Self-pay

## 2023-10-24 ENCOUNTER — Other Ambulatory Visit (HOSPITAL_COMMUNITY): Payer: Self-pay

## 2023-10-24 ENCOUNTER — Inpatient Hospital Stay: Payer: Medicaid Other

## 2023-10-24 ENCOUNTER — Encounter: Payer: Self-pay | Admitting: Adult Health

## 2023-10-24 ENCOUNTER — Inpatient Hospital Stay (HOSPITAL_BASED_OUTPATIENT_CLINIC_OR_DEPARTMENT_OTHER): Payer: Medicaid Other | Admitting: Adult Health

## 2023-10-24 VITALS — BP 110/53 | HR 98 | Temp 98.0°F | Resp 18 | Ht 66.0 in | Wt 144.6 lb

## 2023-10-24 DIAGNOSIS — Z170 Estrogen receptor positive status [ER+]: Secondary | ICD-10-CM

## 2023-10-24 DIAGNOSIS — C50212 Malignant neoplasm of upper-inner quadrant of left female breast: Secondary | ICD-10-CM | POA: Diagnosis not present

## 2023-10-24 DIAGNOSIS — F320 Major depressive disorder, single episode, mild: Secondary | ICD-10-CM

## 2023-10-24 DIAGNOSIS — C787 Secondary malignant neoplasm of liver and intrahepatic bile duct: Secondary | ICD-10-CM | POA: Diagnosis not present

## 2023-10-24 DIAGNOSIS — Z95828 Presence of other vascular implants and grafts: Secondary | ICD-10-CM

## 2023-10-24 DIAGNOSIS — Z87891 Personal history of nicotine dependence: Secondary | ICD-10-CM | POA: Diagnosis not present

## 2023-10-24 DIAGNOSIS — Z5111 Encounter for antineoplastic chemotherapy: Secondary | ICD-10-CM | POA: Diagnosis not present

## 2023-10-24 LAB — CMP (CANCER CENTER ONLY)
ALT: 17 U/L (ref 0–44)
AST: 20 U/L (ref 15–41)
Albumin: 3.9 g/dL (ref 3.5–5.0)
Alkaline Phosphatase: 71 U/L (ref 38–126)
Anion gap: 3 — ABNORMAL LOW (ref 5–15)
BUN: 11 mg/dL (ref 6–20)
CO2: 28 mmol/L (ref 22–32)
Calcium: 8.8 mg/dL — ABNORMAL LOW (ref 8.9–10.3)
Chloride: 105 mmol/L (ref 98–111)
Creatinine: 0.52 mg/dL (ref 0.44–1.00)
GFR, Estimated: >60 mL/min (ref >60)
Glucose, Bld: 99 mg/dL (ref 70–99)
Potassium: 3.8 mmol/L (ref 3.5–5.1)
Sodium: 136 mmol/L (ref 135–145)
Total Bilirubin: 0.2 mg/dL (ref <1.2)
Total Protein: 6 g/dL — ABNORMAL LOW (ref 6.5–8.1)

## 2023-10-24 LAB — CBC WITH DIFFERENTIAL (CANCER CENTER ONLY)
Abs Immature Granulocytes: 0.04 10*3/uL (ref 0.00–0.07)
Basophils Absolute: 0 10*3/uL (ref 0.0–0.1)
Basophils Relative: 1 %
Eosinophils Absolute: 0.1 10*3/uL (ref 0.0–0.5)
Eosinophils Relative: 2 %
HCT: 30.3 % — ABNORMAL LOW (ref 36.0–46.0)
Hemoglobin: 10 g/dL — ABNORMAL LOW (ref 12.0–15.0)
Immature Granulocytes: 1 %
Lymphocytes Relative: 17 %
Lymphs Abs: 0.9 10*3/uL (ref 0.7–4.0)
MCH: 31.9 pg (ref 26.0–34.0)
MCHC: 33 g/dL (ref 30.0–36.0)
MCV: 96.8 fL (ref 80.0–100.0)
Monocytes Absolute: 0.6 10*3/uL (ref 0.1–1.0)
Monocytes Relative: 12 %
Neutro Abs: 3.6 10*3/uL (ref 1.7–7.7)
Neutrophils Relative %: 67 %
Platelet Count: 299 10*3/uL (ref 150–400)
RBC: 3.13 MIL/uL — ABNORMAL LOW (ref 3.87–5.11)
RDW: 18.9 % — ABNORMAL HIGH (ref 11.5–15.5)
WBC Count: 5.2 10*3/uL (ref 4.0–10.5)
nRBC: 0 % (ref 0.0–0.2)

## 2023-10-24 MED ORDER — DIPHENHYDRAMINE HCL 50 MG/ML IJ SOLN
25.0000 mg | Freq: Once | INTRAMUSCULAR | Status: AC
  Administered 2023-10-24: 25 mg via INTRAVENOUS
  Filled 2023-10-24: qty 1

## 2023-10-24 MED ORDER — SODIUM CHLORIDE 0.9% FLUSH
10.0000 mL | Freq: Once | INTRAVENOUS | Status: AC
  Administered 2023-10-24: 10 mL

## 2023-10-24 MED ORDER — PACLITAXEL CHEMO INJECTION 300 MG/50ML
65.0000 mg/m2 | Freq: Once | INTRAVENOUS | Status: AC
  Administered 2023-10-24: 114 mg via INTRAVENOUS
  Filled 2023-10-24: qty 19

## 2023-10-24 MED ORDER — FAMOTIDINE IN NACL 20-0.9 MG/50ML-% IV SOLN
20.0000 mg | Freq: Once | INTRAVENOUS | Status: AC
  Administered 2023-10-24: 20 mg via INTRAVENOUS
  Filled 2023-10-24: qty 50

## 2023-10-24 MED ORDER — SODIUM CHLORIDE 0.9% FLUSH
10.0000 mL | INTRAVENOUS | Status: DC | PRN
Start: 2023-10-24 — End: 2023-10-24
  Administered 2023-10-24: 10 mL

## 2023-10-24 MED ORDER — CITALOPRAM HYDROBROMIDE 40 MG PO TABS
40.0000 mg | ORAL_TABLET | Freq: Every day | ORAL | 1 refills | Status: DC
  Filled 2023-10-24: qty 90, 90d supply, fill #0
  Filled 2024-01-23: qty 90, 90d supply, fill #1

## 2023-10-24 MED ORDER — SODIUM CHLORIDE 0.9 % IV SOLN
Freq: Once | INTRAVENOUS | Status: AC
Start: 2023-10-24 — End: 2023-10-24

## 2023-10-24 MED ORDER — HEPARIN SOD (PORK) LOCK FLUSH 100 UNIT/ML IV SOLN
500.0000 [IU] | Freq: Once | INTRAVENOUS | Status: AC | PRN
  Administered 2023-10-24: 500 [IU]

## 2023-10-24 MED ORDER — DEXAMETHASONE SODIUM PHOSPHATE 10 MG/ML IJ SOLN
10.0000 mg | Freq: Once | INTRAMUSCULAR | Status: AC
  Administered 2023-10-24: 10 mg via INTRAVENOUS
  Filled 2023-10-24: qty 1

## 2023-10-24 NOTE — Progress Notes (Signed)
[facility] Cancer Follow up:    [name], [name] [address] [place] [address]   DIAGNOSIS:  Cancer Staging  Malignant neoplasm of upper-inner quadrant of left female breast [facility]) Staging form: Breast, AJCC 8th Edition - Clinical: Stage IIIB (cT4b, cN0, cM0, G3, ER+, PR+, HER2-) - Signed by Serena Croissant, MD on 07/26/2023 Histologic grading system: 3 grade system - Pathologic: Stage IV (pM1) - Signed by Loa Socks, NP on 08/21/2023   SUMMARY OF ONCOLOGIC HISTORY: Oncology History  Malignant neoplasm of upper-inner quadrant of left female breast (HCC)  07/19/2023 Initial Diagnosis   Palpable left breast mass for 6 months.  Mammogram and ultrasound revealed large masses.  10:30 position: 4.3 cm with skin thickening and ulceration: Grade 2 IDC with high-grade DCIS ER 95%, PR 95%, Ki67 30%, HER2 2+ by IHC, FISH negative ratio 1.37; 5 o'clock position: 3.3 cm with calcifications spanning 6.7 cm, axilla negative, biopsy: Grade 3 IDC with necrosis ER 95% PR 90% Ki-67 60% HER2 1+ negative   07/26/2023 Cancer Staging   Staging form: Breast, AJCC 8th Edition - Clinical: Stage IIIB (cT4b, cN0, cM0, G3, ER+, PR+, HER2-) - Signed by Serena Croissant, MD on 07/26/2023 Histologic grading system: 3 grade system   08/15/2023 -  Chemotherapy   Patient is on Treatment Plan : BREAST ADJUVANT DOSE DENSE AC q14d / PACLitaxel q7d     08/17/2023 Initial Biopsy   Liver biopsy: consistent with metastatic carcinoma, breast primary.     08/21/2023 Cancer Staging   Staging form: Breast, AJCC 8th Edition - Pathologic: Stage IV (pM1) - Signed by Loa Socks, NP on 08/21/2023     CURRENT THERAPY:Weekly Taxol  INTERVAL HISTORY:  Discussed the use of AI scribe software for clinical note transcription with the patient, who gave verbal consent to proceed.  Ann Daugherty 52 y.o. female  with bresat cancer condition requiring Taxol treatment, presents with  improved symptoms after a dose reduction. They report a significant improvement in energy levels, resolution of ear pain, and a decrease in fatigue. They also note a significant improvement in reflux symptoms and taste changes. However, they continue to experience diarrhea, albeit less frequently and severe than before.  The patient also reports a history of alopecia associated with an increase in Celexa dosage from 20mg  to 40mg . Despite this, they express a desire to increase the dosage back to 40mg  due to current stressors. They also report changes in skin sensation and texture, particularly on the fingers, which they manage with a cream application four times a day. They deny any difficulty with fine motor skills, numbness or tingling.  The patient also reports weight loss, but it is unclear whether this is intentional or related to their medical condition. Despite the challenges, the patient expresses gratitude for the improvement in their condition following the medication dose reduction.   Patient Active Problem List   Diagnosis Date Noted   Port-A-Cath in place 08/15/2023   Malignant neoplasm of upper-inner quadrant of left female breast (HCC) 07/20/2023   Depression, major, single episode, mild (HCC) 04/05/2021   Tobacco abuse 01/30/2018   Anxiety 01/30/2018   Attention deficit disorder 01/30/2018   Vitamin D deficiency 01/30/2018    has no known allergies.  MEDICAL HISTORY: Past Medical History:  Diagnosis Date   ADD (attention deficit disorder) 2015   Does not take any medication   Anxiety    Cancer (HCC)    breast   Depression  GERD (gastroesophageal reflux disease)    History of chicken pox    PONV (postoperative nausea and vomiting)     SURGICAL HISTORY: Past Surgical History:  Procedure Laterality Date   BACK SURGERY     BREAST BIOPSY Left 07/19/2023   [place] LT BREAST BX W LOC DEV EA ADD LESION IMG BX SPEC US GUIDE 07/19/2023 [facility]   BREAST BIOPSY Left  07/19/2023   [place] LT BREAST BX W LOC DEV 1ST LESION IMG BX SPEC US GUIDE 07/19/2023 [facility]   BREAST BIOPSY Right 08/09/2023   [place] RT BREAST BX W LOC DEV 1ST LESION IMG BX SPEC US GUIDE 08/09/2023 [facility]   BREAST BIOPSY Right 08/09/2023   [place] RT BREAST BX W LOC DEV EA ADD LESION IMG BX SPEC US GUIDE 08/09/2023 [facility]   LAMINECTOMY     2007, 2011   PORTACATH PLACEMENT Right 08/14/2023   Procedure: INSERTION PORT-A-CATH WITH ULTRASOUND GUIDEANCE;  Surgeon: Almond Lint, MD;  Location: [facility] OR;  Service: General;  Laterality: Right;   VAGINAL DELIVERY  2004, 2006    SOCIAL HISTORY: Social History   Socioeconomic History   Marital status: Divorced    Spouse name: Not on file   Number of children: 2   Years of education: Not on file   Highest education level: Not on file  Occupational History   Not on file  Tobacco Use   Smoking status: Former    Current packs/day: 0.00    Average packs/day: 0.5 packs/day for 30.0 years (15.0 ttl pk-yrs)    Types: Cigarettes    Start date: 04/07/1991    Quit date: 04/06/2021    Years since quitting: 2.5   Smokeless tobacco: Current  Vaping Use   Vaping status: Every Day   Substances: Nicotine  Substance and Sexual Activity   Alcohol use: Yes    Comment: 3-4 drinks a year   Drug use: No   Sexual activity: Not Currently    Birth control/protection: None  Other Topics Concern   Not on file  Social History Narrative   33 and 95 y/o sons   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   Social Drivers of Health   Financial Resource Strain: Not on file  Food Insecurity: Food Insecurity Present (07/27/2023)   Hunger Vital Sign    Worried About Running Out of Food in the Last Year: Sometimes true    Ran Out of Food in the Last Year: Sometimes true  Transportation Needs: No Transportation Needs (07/27/2023)   PRAPARE - Administrator, Civil Service (Medical): No    Lack of Transportation (Non-Medical): No  Physical  Activity: Not on file  Stress: Not on file  Social Connections: Not on file  Intimate Partner Violence: Not At Risk (07/27/2023)   Humiliation, Afraid, Rape, and Kick questionnaire    Fear of Current or Ex-Partner: No    Emotionally Abused: No    Physically Abused: No    Sexually Abused: No    FAMILY HISTORY: Family History  Problem Relation Age of Onset   Breast cancer Mother 1   Colon cancer Mother 75   Asthma Mother    Hypertension Mother    Hearing loss Mother    Hypertension Father    Diabetes Father    Hyperlipidemia Father    Learning disabilities Son    ADD / ADHD Son    Ovarian cancer Maternal Grandmother    Hypertension Maternal Grandfather    Hyperlipidemia  Maternal Grandfather    Heart disease Maternal Grandfather    Stroke Maternal Grandfather    Mental illness Paternal Grandmother    Hypertension Paternal Grandfather     Review of Systems  Constitutional:  Positive for fatigue. Negative for appetite change, chills, fever and unexpected weight change.  HENT:   Negative for hearing loss, lump/mass and trouble swallowing.   Eyes:  Negative for eye problems and icterus.  Respiratory:  Negative for chest tightness, cough and shortness of breath.   Cardiovascular:  Negative for chest pain, leg swelling and palpitations.  Gastrointestinal:  Positive for diarrhea. Negative for abdominal distention, abdominal pain, constipation, nausea and vomiting.  Endocrine: Negative for hot flashes.  Genitourinary:  Negative for difficulty urinating.   Musculoskeletal:  Negative for arthralgias.  Skin:  Negative for itching and rash.  Neurological:  Negative for dizziness, extremity weakness, headaches and numbness.  Hematological:  Negative for adenopathy. Does not bruise/bleed easily.  Psychiatric/Behavioral:  Negative for depression. The patient is not nervous/anxious.       PHYSICAL EXAMINATION   Onc Performance Status - 10/24/23 0800       KPS SCALE   KPS % SCORE  Normal, no compliants, no evidence of disease             Vitals:   10/24/23 0836  BP: (!) 110/53  Pulse: 98  Resp: 18  Temp: 98 F (36.7 C)  SpO2: 100%    Physical Exam Constitutional:      General: She is not in acute distress.    Appearance: Normal appearance. She is not toxic-appearing.  HENT:     Head: Normocephalic and atraumatic.     Mouth/Throat:     Mouth: Mucous membranes are moist.     Pharynx: Oropharynx is clear. No oropharyngeal exudate or posterior oropharyngeal erythema.  Eyes:     General: No scleral icterus. Cardiovascular:     Rate and Rhythm: Normal rate and regular rhythm.     Pulses: Normal pulses.     Heart sounds: Normal heart sounds.  Pulmonary:     Effort: Pulmonary effort is normal.     Breath sounds: Normal breath sounds.  Abdominal:     General: Abdomen is flat. Bowel sounds are normal. There is no distension.     Palpations: Abdomen is soft.     Tenderness: There is no abdominal tenderness.  Musculoskeletal:        General: No swelling.     Cervical back: Neck supple.  Lymphadenopathy:     Cervical: No cervical adenopathy.  Skin:    General: Skin is warm and dry.     Findings: No rash.  Neurological:     General: No focal deficit present.     Mental Status: She is alert.  Psychiatric:        Mood and Affect: Mood normal.        Behavior: Behavior normal.     LABORATORY DATA:  CBC    Component Value Date/Time   WBC 5.2 10/24/2023 0817   WBC 7.2 08/17/2023 1115   RBC 3.13 (L) 10/24/2023 0817   HGB 10.0 (L) 10/24/2023 0817   HCT 30.3 (L) 10/24/2023 0817   PLT 299 10/24/2023 0817   MCV 96.8 10/24/2023 0817   MCH 31.9 10/24/2023 0817   MCHC 33.0 10/24/2023 0817   RDW 18.9 (H) 10/24/2023 0817   LYMPHSABS 0.9 10/24/2023 0817   MONOABS 0.6 10/24/2023 0817   EOSABS 0.1 10/24/2023 0817   BASOSABS 0.0  10/24/2023 0817    CMP     Component Value Date/Time   NA 136 10/24/2023 0817   K 3.8 10/24/2023 0817   CL 105  10/24/2023 0817   CO2 28 10/24/2023 0817   GLUCOSE 99 10/24/2023 0817   BUN 11 10/24/2023 0817   CREATININE 0.52 10/24/2023 0817   CREATININE 0.63 08/09/2020 1032   CALCIUM 8.8 (L) 10/24/2023 0817   PROT 6.0 (L) 10/24/2023 0817   ALBUMIN 3.9 10/24/2023 0817   AST 20 10/24/2023 0817   ALT 17 10/24/2023 0817   ALKPHOS 71 10/24/2023 0817   BILITOT 0.2 10/24/2023 0817   GFRNONAA >60 10/24/2023 0817       ASSESSMENT and THERAPY PLAN:   Malignant neoplasm of upper-inner quadrant of left female breast (HCC) 07/19/2023:Palpable left breast mass for 6 months.  Mammogram and ultrasound revealed large masses.   10:30 position: 4.3 cm with skin thickening and ulceration: Grade 2 IDC with high-grade DCIS ER 95%, PR 95%, Ki67 30%, HER2 2+ by IHC, FISH negative ratio 1.37;  5 o'clock position: 3.3 cm with calcifications spanning 6.7 cm, axilla negative, biopsy: Grade 3 IDC with necrosis ER 95% PR 90% Ki-67 60% HER2 1+ negative   CT CAP 08/03/2023: 2 prominent left breast masses, left axillary lymph nodes, right hepatic lobe lesion concerning for metastatic breast cancer Ultrasound a liver biopsy scheduled for 08/17/2023   Treatment plan: Neoadjuvant chemotherapy with dose Adriamycin and Cytoxan followed by Taxol Mastectomy left breast Adjuvant radiation Antiestrogen therapy with CDK inhibitors -------------------------------------------------------------------------------------------------------------------------------------- Current treatment: Completed 4 cycles of dose dense Adriamycin and Cytoxan, today is cycle 3 Taxol   Liver biopsy  08/17/2023.  Metastatic breast cancer ER 90%, PR 90%, Ki-67 45%, HER2 0 The reason for using definitive chemotherapy is because she has solitary liver metastasis.  Chemotherapy side effects Improvement in symptoms with dose reduction. No significant fatigue, reflux, or taste changes. Persistent diarrhea, but less frequent. Possible skin changes in  fingers. -Continue current chemotherapy regimen at reduced dose. -Consider use of Imodium for diarrhea management. -Monitor for potential skin and sensory changes (on PAXMAN study)  Depression Patient previously on Celexa 20mg , increased to 40mg  led to alopecia. Patient wishes to increase dose again due to current stressors. -Increase Celexa to 40mg  daily. -Monitor for potential side effects, including alopecia.  General Health Maintenance -Continue monitoring labs and physical health during chemotherapy treatment. -Follow-up after next two chemotherapy sessions for further assessment.    All questions were answered. The patient knows to call the clinic with any problems, questions or concerns. We can certainly see the patient much sooner if necessary.  Total encounter time:20 minutes*in face-to-face visit time, chart review, lab review, care coordination, order entry, and documentation of the encounter time.  Lillard Anes, NP 10/24/23 11:09 AM Medical Oncology and Hematology [facility] [address] [place], [place] [address] Tel. [phone]    Fax. [phone]  *Total Encounter Time as defined by the Centers for Medicare and Medicaid Services includes, in addition to the face-to-face time of a patient visit (documented in the note above) non-face-to-face time: obtaining and reviewing outside history, ordering and reviewing medications, tests or procedures, care coordination (communications with other health care professionals or caregivers) and documentation in the medical record.

## 2023-10-24 NOTE — Patient Instructions (Signed)
 [facility] - A DEPT OF [facility]  Discharge Instructions: Thank you for choosing [facility] to provide your oncology and hematology care.   If you have a lab appointment with the Cancer Center, please go directly to the Cancer Center and check in at the registration area.   Wear comfortable clothing and clothing appropriate for easy access to any Portacath or PICC line.   We strive to give you quality time with your provider. You may need to reschedule your appointment if you arrive late (15 or more minutes).  Arriving late affects you and other patients whose appointments are after yours.  Also, if you miss three or more appointments without notifying the office, you may be dismissed from the clinic at the provider's discretion.      For prescription refill requests, have your pharmacy contact our office and allow 72 hours for refills to be completed.    Today you received the following chemotherapy and/or immunotherapy agents: Paclitaxel      To help prevent nausea and vomiting after your treatment, we encourage you to take your nausea medication as directed.  BELOW ARE SYMPTOMS THAT SHOULD BE REPORTED IMMEDIATELY: *FEVER GREATER THAN 100.4 F (38 C) OR HIGHER *CHILLS OR SWEATING *NAUSEA AND VOMITING THAT IS NOT CONTROLLED WITH YOUR NAUSEA MEDICATION *UNUSUAL SHORTNESS OF BREATH *UNUSUAL BRUISING OR BLEEDING *URINARY PROBLEMS (pain or burning when urinating, or frequent urination) *BOWEL PROBLEMS (unusual diarrhea, constipation, pain near the anus) TENDERNESS IN MOUTH AND THROAT WITH OR WITHOUT PRESENCE OF ULCERS (sore throat, sores in mouth, or a toothache) UNUSUAL RASH, SWELLING OR PAIN  UNUSUAL VAGINAL DISCHARGE OR ITCHING   Items with * indicate a potential emergency and should be followed up as soon as possible or go to the Emergency Department if any problems should occur.  Please show the CHEMOTHERAPY ALERT CARD or IMMUNOTHERAPY  ALERT CARD at check-in to the Emergency Department and triage nurse.  Should you have questions after your visit or need to cancel or reschedule your appointment, please contact [facility] - A DEPT OF [facility]  Dept: [phone]  and follow the prompts.  Office hours are 8:00 a.m. to 4:30 p.m. Monday - Friday. Please note that voicemails left after 4:00 p.m. may not be returned until the following business day.  We are closed weekends and major holidays. You have access to a nurse at all times for urgent questions. Please call the main number to the clinic Dept: [phone] and follow the prompts.   For any non-urgent questions, you may also contact your provider using MyChart. We now offer e-Visits for anyone 89 and older to request care online for non-urgent symptoms. For details visit [email or web address].   Also download the MyChart app! Go to the app store, search "MyChart", open the app, select [facility], and log in with your MyChart username and password.  Paclitaxel Injection What is this medication? PACLITAXEL (PAK li TAX el) treats some types of cancer. It works by slowing down the growth of cancer cells. This medicine may be used for other purposes; ask your health care provider or pharmacist if you have questions. COMMON BRAND NAME(S): Onxol, Taxol What should I tell my care team before I take this medication? They need to know if you have any of these conditions: Heart disease Liver disease Low white blood cell levels An unusual or allergic reaction to paclitaxel, other medications, foods,  dyes, or preservatives If you or your partner are pregnant or trying to get pregnant Breast-feeding How should I use this medication? This medication is injected into a vein. It is given by your care team in a hospital or clinic setting. Talk to your care team about the use of this medication in children. While it may be given to children for selected  conditions, precautions do apply. Overdosage: If you think you have taken too much of this medicine contact a poison control center or emergency room at once. NOTE: This medicine is only for you. Do not share this medicine with others. What if I miss a dose? Keep appointments for follow-up doses. It is important not to miss your dose. Call your care team if you are unable to keep an appointment. What may interact with this medication? Do not take this medication with any of the following: Live virus vaccines Other medications may affect the way this medication works. Talk with your care team about all of the medications you take. They may suggest changes to your treatment plan to lower the risk of side effects and to make sure your medications work as intended. This list may not describe all possible interactions. Give your health care provider a list of all the medicines, herbs, non-prescription drugs, or dietary supplements you use. Also tell them if you smoke, drink alcohol, or use illegal drugs. Some items may interact with your medicine. What should I watch for while using this medication? Your condition will be monitored carefully while you are receiving this medication. You may need blood work while taking this medication. This medication may make you feel generally unwell. This is not uncommon as chemotherapy can affect healthy cells as well as cancer cells. Report any side effects. Continue your course of treatment even though you feel ill unless your care team tells you to stop. This medication can cause serious allergic reactions. To reduce the risk, your care team may give you other medications to take before receiving this one. Be sure to follow the directions from your care team. This medication may increase your risk of getting an infection. Call your care team for advice if you get a fever, chills, sore throat, or other symptoms of a cold or flu. Do not treat yourself. Try to avoid  being around people who are sick. This medication may increase your risk to bruise or bleed. Call your care team if you notice any unusual bleeding. Be careful brushing or flossing your teeth or using a toothpick because you may get an infection or bleed more easily. If you have any dental work done, tell your dentist you are receiving this medication. Talk to your care team if you may be pregnant. Serious birth defects can occur if you take this medication during pregnancy. Talk to your care team before breastfeeding. Changes to your treatment plan may be needed. What side effects may I notice from receiving this medication? Side effects that you should report to your care team as soon as possible: Allergic reactions--skin rash, itching, hives, swelling of the face, lips, tongue, or throat Heart rhythm changes--fast or irregular heartbeat, dizziness, feeling faint or lightheaded, chest pain, trouble breathing Increase in blood pressure Infection--fever, chills, cough, sore throat, wounds that don't heal, pain or trouble when passing urine, general feeling of discomfort or being unwell Low blood pressure--dizziness, feeling faint or lightheaded, blurry vision Low red blood cell level--unusual weakness or fatigue, dizziness, headache, trouble breathing Painful swelling, warmth, or redness of  the skin, blisters or sores at the infusion site Pain, tingling, or numbness in the hands or feet Slow heartbeat--dizziness, feeling faint or lightheaded, confusion, trouble breathing, unusual weakness or fatigue Unusual bruising or bleeding Side effects that usually do not require medical attention (report to your care team if they continue or are bothersome): Diarrhea Hair loss Joint pain Loss of appetite Muscle pain Nausea Vomiting This list may not describe all possible side effects. Call your doctor for medical advice about side effects. You may report side effects to FDA at 1-800-FDA-1088. Where  should I keep my medication? This medication is given in a hospital or clinic. It will not be stored at home. NOTE: This sheet is a summary. It may not cover all possible information. If you have questions about this medicine, talk to your doctor, pharmacist, or health care provider.  2024 [employer]vier/Gold Standard (2022-03-14 00:00:00)

## 2023-10-24 NOTE — Assessment & Plan Note (Signed)
07/19/2023:Palpable left breast mass for 6 months.  Mammogram and ultrasound revealed large masses.   10:30 position: 4.3 cm with skin thickening and ulceration: Grade 2 IDC with high-grade DCIS ER 95%, PR 95%, Ki67 30%, HER2 2+ by IHC, FISH negative ratio 1.37;  5 o'clock position: 3.3 cm with calcifications spanning 6.7 cm, axilla negative, biopsy: Grade 3 IDC with necrosis ER 95% PR 90% Ki-67 60% HER2 1+ negative   CT CAP 08/03/2023: 2 prominent left breast masses, left axillary lymph nodes, right hepatic lobe lesion concerning for metastatic breast cancer Ultrasound a liver biopsy scheduled for 08/17/2023   Treatment plan: Neoadjuvant chemotherapy with dose Adriamycin and Cytoxan followed by Taxol Mastectomy left breast Adjuvant radiation Antiestrogen therapy with CDK inhibitors -------------------------------------------------------------------------------------------------------------------------------------- Current treatment: Completed 4 cycles of dose dense Adriamycin and Cytoxan, today is cycle 3 Taxol   Liver biopsy  08/17/2023.  Metastatic breast cancer ER 90%, PR 90%, Ki-67 45%, HER2 0 The reason for using definitive chemotherapy is because she has solitary liver metastasis.  Chemotherapy side effects Improvement in symptoms with dose reduction. No significant fatigue, reflux, or taste changes. Persistent diarrhea, but less frequent. Possible skin changes in fingers. -Continue current chemotherapy regimen at reduced dose. -Consider use of Imodium for diarrhea management. -Monitor for potential skin and sensory changes (on PAXMAN study)  Depression Patient previously on Celexa 20mg , increased to 40mg  led to alopecia. Patient wishes to increase dose again due to current stressors. -Increase Celexa to 40mg  daily. -Monitor for potential side effects, including alopecia.  General Health Maintenance -Continue monitoring labs and physical health during chemotherapy  treatment. -Follow-up after next two chemotherapy sessions for further assessment.

## 2023-10-24 NOTE — Research (Signed)
E4540, ICE COMPRESS: RANDOMIZED TRIAL OF LIMB CRYOCOMPRESSION VERSUS CONTINUOUS COMPRESSION VERSUS LOW CYCLIC COMPRESSION FOR THE PREVENTION  OF TAXANE-INDUCED PERIPHERAL NEUROPATHY   Patient arrives today Unaccompanied for the cycle 3 visit. Confirmed patient does not have wounds, sores, or lesions to extremities. Patient has not had any vaccinations since last visit.   ADVERSE EVENTS: Patient reports no adverse events from study intervention (Paxman machine). Pt denies any neuropathy symptoms (sensory or motor). She did report fatigue today though this has improved from last week.    SOLICITED ADVERSE EVENTS:    Adverse Event CTCAE Grade Relationship to Study Intervention Action Taken Comments  Skin atrophy 0 NA      Skin hyperpigmentation 0 NA      Skin hypopigmentation 0 NA      Skin induration 0 NA      Skin ulceration 0 NA      Rash maculopapular 0 NA      Nail changes 0 NA      Cold intolerance (general disorders and administration site conditions- other) 0 NA      Frostbite (skin and subcutaneous tissue disorders- other) 0 NA      Fatigue 1 Not related to paxman  machine none Pt reports mild fatigue today. Stating she is still able to perform everyday task but does need intermittent rest periods.       STUDY INTERVENTION & TOLERABILITY ASSESSMENTS: 10:06am- Wraps applied; pre-treatment started. Starting temperature 11 degrees Celsius. Starting pressure 5-15 mmHG. 10:16 am- Required 5-10 minute Tolerability check- pt states tolerable to cooling and compression 10:39 am- Taxane infusion start time. Moved to treatment phase.  11:20 am- Pt took a 5 minute bathroom break 11:25 am-  Pt placed back in wraps and intervention restarted 11:41 am- Taxane infusion end time. Moved to post-treatment phase.  2:20 pm- Study intervention end time. Confirmed no new wounds, sores, or lesions.   DISPOSITION: Upon completion off all study requirements, patient remained in infusion chair with  infusion RN Kayla with pt. Pt understands that research will meet her with cycle 4 that is scheduled for 11-01-23.    The patient was thanked for their time and continued voluntary participation in this study. [name] has been provided direct contact information and is encouraged to contact this Nurse for any needs or questions.  Zerita Boers BSN RN Clinical Research Nurse [facility] Direct Dial: [phone] 10/24/2023  1:02 PM

## 2023-10-24 NOTE — Research (Signed)
Trial: DCP-001: Use of a Clinical Trial Screening Tool to Address Cancer Health Disparities in the [facility] Oncology Research Program [facility])   [name] was identified by Dr. Pamelia Hoit as a potential candidate for the above listed study.  This Clinical Research Nurse met with Ann Daugherty, [id number], on 10/24/23 in a manner and location that ensures patient privacy to discuss participation in the above listed research study.  Patient is Unaccompanied.  A copy of the informed consent document and separate HIPAA Authorization was provided to the patient.  Patient reads, speaks, and understands [place].    Patient was provided with the business card of this Nurse and encouraged to contact the research team with any questions.  Patient was provided the option of taking informed consent documents home to review and was encouraged to review at their convenience with their support network, including other care providers. Patient is comfortable with making a decision regarding study participation today.  As outlined in the informed consent form, this Nurse and [name] discussed the purpose of the research study, the investigational nature of the study, study procedures and requirements for study participation, potential risks and benefits of study participation, as well as alternatives to participation. This study is not blinded. The patient understands participation is voluntary and they may withdraw from study participation at any time.  This study does not involve randomization.  This study does not involve an investigational drug or device. This study does not involve a placebo. Patient understands enrollment is pending full eligibility review.   Confidentiality and how the patient's information will be used as part of study participation were discussed.  Patient was informed there is not reimbursement provided for their time and effort spent on trial participation.  The  patient is encouraged to discuss research study participation with their insurance provider to determine what costs they may incur as part of study participation, including research related injury.    All questions were answered to patient's satisfaction.  The informed consent and separate HIPAA Authorization was reviewed page by page.  The patient's mental and emotional status is appropriate to provide informed consent, and the patient verbalizes an understanding of study participation.  Patient has agreed to participate in the above listed research study and has voluntarily signed the informed consent  protocol version date 12-05-21 and separate HIPAA Authorization, version 15 08-16-21  on 10/24/23 at 0941AM.  The patient was provided with a copy of the signed informed consent form and separate HIPAA Authorization for their reference.  No study specific procedures were obtained prior to the signing of the informed consent document.  Approximately 15 minutes were spent with the patient reviewing the informed consent documents.  Patient was not requested to complete a Release of Information form.  This Nurse has reviewed this patient's inclusion and exclusion criteria and confirmed Ann Daugherty is eligible for study participation.  Patient will continue with enrollment.   Zerita Boers BSN RN Clinical Research Nurse [facility] Direct Dial: [phone] 10/24/2023  11:55 AM

## 2023-10-25 ENCOUNTER — Other Ambulatory Visit: Payer: Self-pay

## 2023-10-26 ENCOUNTER — Encounter: Payer: Self-pay | Admitting: Hematology and Oncology

## 2023-11-01 ENCOUNTER — Inpatient Hospital Stay: Payer: Medicaid Other

## 2023-11-01 VITALS — BP 110/62 | HR 88 | Temp 98.2°F | Resp 18 | Wt 146.5 lb

## 2023-11-01 DIAGNOSIS — C50212 Malignant neoplasm of upper-inner quadrant of left female breast: Secondary | ICD-10-CM

## 2023-11-01 DIAGNOSIS — C787 Secondary malignant neoplasm of liver and intrahepatic bile duct: Secondary | ICD-10-CM | POA: Diagnosis not present

## 2023-11-01 DIAGNOSIS — Z87891 Personal history of nicotine dependence: Secondary | ICD-10-CM | POA: Diagnosis not present

## 2023-11-01 DIAGNOSIS — Z5111 Encounter for antineoplastic chemotherapy: Secondary | ICD-10-CM | POA: Diagnosis not present

## 2023-11-01 DIAGNOSIS — Z170 Estrogen receptor positive status [ER+]: Secondary | ICD-10-CM | POA: Diagnosis not present

## 2023-11-01 DIAGNOSIS — Z95828 Presence of other vascular implants and grafts: Secondary | ICD-10-CM

## 2023-11-01 LAB — CMP (CANCER CENTER ONLY)
ALT: 16 U/L (ref 0–44)
AST: 16 U/L (ref 15–41)
Albumin: 3.8 g/dL (ref 3.5–5.0)
Alkaline Phosphatase: 73 U/L (ref 38–126)
Anion gap: 5 (ref 5–15)
BUN: 12 mg/dL (ref 6–20)
CO2: 27 mmol/L (ref 22–32)
Calcium: 8.9 mg/dL (ref 8.9–10.3)
Chloride: 106 mmol/L (ref 98–111)
Creatinine: 0.56 mg/dL (ref 0.44–1.00)
GFR, Estimated: >60 mL/min (ref >60)
Glucose, Bld: 106 mg/dL — ABNORMAL HIGH (ref 70–99)
Potassium: 3.7 mmol/L (ref 3.5–5.1)
Sodium: 138 mmol/L (ref 135–145)
Total Bilirubin: 0.2 mg/dL (ref <1.2)
Total Protein: 6.3 g/dL — ABNORMAL LOW (ref 6.5–8.1)

## 2023-11-01 LAB — CBC WITH DIFFERENTIAL (CANCER CENTER ONLY)
Abs Immature Granulocytes: 0.04 10*3/uL (ref 0.00–0.07)
Basophils Absolute: 0.1 10*3/uL (ref 0.0–0.1)
Basophils Relative: 1 %
Eosinophils Absolute: 0.1 10*3/uL (ref 0.0–0.5)
Eosinophils Relative: 2 %
HCT: 30.4 % — ABNORMAL LOW (ref 36.0–46.0)
Hemoglobin: 10 g/dL — ABNORMAL LOW (ref 12.0–15.0)
Immature Granulocytes: 1 %
Lymphocytes Relative: 13 %
Lymphs Abs: 0.7 10*3/uL (ref 0.7–4.0)
MCH: 31.6 pg (ref 26.0–34.0)
MCHC: 32.9 g/dL (ref 30.0–36.0)
MCV: 96.2 fL (ref 80.0–100.0)
Monocytes Absolute: 0.4 10*3/uL (ref 0.1–1.0)
Monocytes Relative: 7 %
Neutro Abs: 4.2 10*3/uL (ref 1.7–7.7)
Neutrophils Relative %: 76 %
Platelet Count: 322 10*3/uL (ref 150–400)
RBC: 3.16 MIL/uL — ABNORMAL LOW (ref 3.87–5.11)
RDW: 18.5 % — ABNORMAL HIGH (ref 11.5–15.5)
WBC Count: 5.6 10*3/uL (ref 4.0–10.5)
nRBC: 0 % (ref 0.0–0.2)

## 2023-11-01 LAB — PREGNANCY, URINE: Preg Test, Ur: NEGATIVE

## 2023-11-01 MED ORDER — DEXAMETHASONE SODIUM PHOSPHATE 10 MG/ML IJ SOLN
10.0000 mg | Freq: Once | INTRAMUSCULAR | Status: AC
  Administered 2023-11-01: 10 mg via INTRAVENOUS
  Filled 2023-11-01: qty 1

## 2023-11-01 MED ORDER — SODIUM CHLORIDE 0.9% FLUSH
10.0000 mL | Freq: Once | INTRAVENOUS | Status: AC
  Administered 2023-11-01: 10 mL

## 2023-11-01 MED ORDER — SODIUM CHLORIDE 0.9 % IV SOLN: Freq: Once | INTRAVENOUS | Status: AC

## 2023-11-01 MED ORDER — FAMOTIDINE IN NACL 20-0.9 MG/50ML-% IV SOLN
20.0000 mg | Freq: Once | INTRAVENOUS | Status: AC
Start: 2023-11-01 — End: 2023-11-01
  Administered 2023-11-01: 20 mg via INTRAVENOUS
  Filled 2023-11-01: qty 50

## 2023-11-01 MED ORDER — DIPHENHYDRAMINE HCL 50 MG/ML IJ SOLN
25.0000 mg | Freq: Once | INTRAMUSCULAR | Status: AC
  Administered 2023-11-01: 25 mg via INTRAVENOUS
  Filled 2023-11-01: qty 1

## 2023-11-01 MED ORDER — SODIUM CHLORIDE 0.9 % IV SOLN
65.0000 mg/m2 | Freq: Once | INTRAVENOUS | Status: AC
  Administered 2023-11-01: 114 mg via INTRAVENOUS
  Filled 2023-11-01: qty 19

## 2023-11-01 NOTE — Research (Signed)
Z6109, ICE COMPRESS: RANDOMIZED TRIAL OF LIMB CRYOCOMPRESSION VERSUS CONTINUOUS COMPRESSION VERSUS LOW CYCLIC COMPRESSION FOR THE PREVENTION  OF TAXANE-INDUCED PERIPHERAL NEUROPATHY   Patient arrives today Unaccompanied for the Cycle 4 visit. Confirmed patient does not have wounds, sores, or lesions to extremities. Patient has not had any vaccinations since last visit.   No PROs or study labs are required this visit.    ADVERSE EVENTS: Patient reports no new AEs.  SOLICITED ADVERSE EVENTS:  Adverse Event CTCAE Grade Onset date Resolved date Relationship to Study Intervention Action Taken Comments  Skin atrophy 0       Skin hyperpigmentation 0       Skin hypopigmentation 0       Skin induration 0       Skin ulceration 0       Rash maculopapular 0       Nail changes 0       Cold intolerance (general disorders and administration site conditions- other) 0       Frostbite (skin and subcutaneous tissue disorders- other) 0       Fatigue 1 10/24/23  Not related None Fatigue originally reported on 10/17/23 as a Grade 3 and was downgraded 10/24/23 to Grade 1    NEURO ASSESSMENT: Not due at this time point.    STUDY INTERVENTION & TOLERABILITY ASSESSMENTS: 1412 Wraps applied; pre-treatment started. 1422 Tolerability check; pt with eyes closed. 1441 Moved to treatment phase. Starting temperature 11 degrees Celcius. Starting pressure 5-15 mmHg. 1537 Pause for restroom break 1542 Restarted; will need to make up 5 minutes 1547 Moved to post-treatment phase; 5 minute make up complete 1616 Wraps removed. Confirmed no new wounds, sores, or lesions.  Was the treatment paused for a bathroom break? Yes For how long? 5 minutes Was this time made up prior to treatment completion? Yes    DISPOSITION: Upon completion off all study requirements, patient remained in chair putting shoes/jacket on.   The patient was thanked for their time and continued voluntary participation in this study. Patient  Ann Daugherty has been provided direct contact information and is encouraged to contact this Nurse for any needs or questions.  Ann Chance Andruw Battie, RN, BSN, [facility]  Her  Hers Clinical Research Nurse [facility] Direct Dial [phone] 11/01/2023 4:27 PM

## 2023-11-02 ENCOUNTER — Encounter: Payer: Self-pay | Admitting: Hematology and Oncology

## 2023-11-07 ENCOUNTER — Other Ambulatory Visit: Payer: Self-pay

## 2023-11-07 DIAGNOSIS — Z419 Encounter for procedure for purposes other than remedying health state, unspecified: Secondary | ICD-10-CM | POA: Diagnosis not present

## 2023-11-08 ENCOUNTER — Inpatient Hospital Stay: Payer: Medicaid Other | Admitting: Adult Health

## 2023-11-08 ENCOUNTER — Other Ambulatory Visit (HOSPITAL_COMMUNITY): Payer: Self-pay

## 2023-11-08 ENCOUNTER — Inpatient Hospital Stay: Payer: Medicaid Other | Attending: Hematology and Oncology

## 2023-11-08 ENCOUNTER — Encounter: Payer: Self-pay | Admitting: Adult Health

## 2023-11-08 ENCOUNTER — Inpatient Hospital Stay: Payer: Medicaid Other

## 2023-11-08 VITALS — BP 112/84 | HR 80 | Temp 98.1°F | Resp 19 | Wt 146.5 lb

## 2023-11-08 DIAGNOSIS — C50212 Malignant neoplasm of upper-inner quadrant of left female breast: Secondary | ICD-10-CM | POA: Insufficient documentation

## 2023-11-08 DIAGNOSIS — Z170 Estrogen receptor positive status [ER+]: Secondary | ICD-10-CM

## 2023-11-08 DIAGNOSIS — Z5111 Encounter for antineoplastic chemotherapy: Secondary | ICD-10-CM | POA: Diagnosis not present

## 2023-11-08 DIAGNOSIS — C787 Secondary malignant neoplasm of liver and intrahepatic bile duct: Secondary | ICD-10-CM | POA: Diagnosis not present

## 2023-11-08 DIAGNOSIS — R300 Dysuria: Secondary | ICD-10-CM

## 2023-11-08 DIAGNOSIS — F17290 Nicotine dependence, other tobacco product, uncomplicated: Secondary | ICD-10-CM | POA: Insufficient documentation

## 2023-11-08 LAB — URINALYSIS, COMPLETE (UACMP) WITH MICROSCOPIC
Bilirubin Urine: NEGATIVE
Glucose, UA: NEGATIVE mg/dL
Hgb urine dipstick: NEGATIVE
Ketones, ur: NEGATIVE mg/dL
Nitrite: NEGATIVE
Protein, ur: NEGATIVE mg/dL
Specific Gravity, Urine: 1.021 (ref 1.005–1.030)
pH: 5 (ref 5.0–8.0)

## 2023-11-08 LAB — CBC WITH DIFFERENTIAL (CANCER CENTER ONLY)
Abs Immature Granulocytes: 0.03 10*3/uL (ref 0.00–0.07)
Basophils Absolute: 0 10*3/uL (ref 0.0–0.1)
Basophils Relative: 1 %
Eosinophils Absolute: 0.3 10*3/uL (ref 0.0–0.5)
Eosinophils Relative: 6 %
HCT: 30.9 % — ABNORMAL LOW (ref 36.0–46.0)
Hemoglobin: 10.3 g/dL — ABNORMAL LOW (ref 12.0–15.0)
Immature Granulocytes: 1 %
Lymphocytes Relative: 15 %
Lymphs Abs: 0.8 10*3/uL (ref 0.7–4.0)
MCH: 32.1 pg (ref 26.0–34.0)
MCHC: 33.3 g/dL (ref 30.0–36.0)
MCV: 96.3 fL (ref 80.0–100.0)
Monocytes Absolute: 0.4 10*3/uL (ref 0.1–1.0)
Monocytes Relative: 8 %
Neutro Abs: 3.6 10*3/uL (ref 1.7–7.7)
Neutrophils Relative %: 69 %
Platelet Count: 332 10*3/uL (ref 150–400)
RBC: 3.21 MIL/uL — ABNORMAL LOW (ref 3.87–5.11)
RDW: 17 % — ABNORMAL HIGH (ref 11.5–15.5)
WBC Count: 5.2 10*3/uL (ref 4.0–10.5)
nRBC: 0 % (ref 0.0–0.2)

## 2023-11-08 LAB — CMP (CANCER CENTER ONLY)
ALT: 18 U/L (ref 0–44)
AST: 17 U/L (ref 15–41)
Albumin: 3.9 g/dL (ref 3.5–5.0)
Alkaline Phosphatase: 75 U/L (ref 38–126)
Anion gap: 4 — ABNORMAL LOW (ref 5–15)
BUN: 10 mg/dL (ref 6–20)
CO2: 27 mmol/L (ref 22–32)
Calcium: 8.9 mg/dL (ref 8.9–10.3)
Chloride: 106 mmol/L (ref 98–111)
Creatinine: 0.59 mg/dL (ref 0.44–1.00)
GFR, Estimated: >60 mL/min (ref >60)
Glucose, Bld: 106 mg/dL — ABNORMAL HIGH (ref 70–99)
Potassium: 3.8 mmol/L (ref 3.5–5.1)
Sodium: 137 mmol/L (ref 135–145)
Total Bilirubin: 0.2 mg/dL (ref 0.0–1.2)
Total Protein: 6.4 g/dL — ABNORMAL LOW (ref 6.5–8.1)

## 2023-11-08 MED ORDER — SODIUM CHLORIDE 0.9 % IV SOLN
Freq: Once | INTRAVENOUS | Status: AC
Start: 2023-11-08 — End: 2023-11-08

## 2023-11-08 MED ORDER — NITROFURANTOIN MONOHYD MACRO 100 MG PO CAPS
100.0000 mg | ORAL_CAPSULE | Freq: Two times a day (BID) | ORAL | 0 refills | Status: DC
  Filled 2023-11-08: qty 14, 7d supply, fill #0

## 2023-11-08 MED ORDER — SODIUM CHLORIDE 0.9% FLUSH
10.0000 mL | INTRAVENOUS | Status: DC | PRN
  Administered 2023-11-08: 10 mL

## 2023-11-08 MED ORDER — FAMOTIDINE IN NACL 20-0.9 MG/50ML-% IV SOLN
20.0000 mg | Freq: Once | INTRAVENOUS | Status: AC
  Administered 2023-11-08: 20 mg via INTRAVENOUS
  Filled 2023-11-08: qty 50

## 2023-11-08 MED ORDER — DEXAMETHASONE SODIUM PHOSPHATE 10 MG/ML IJ SOLN
10.0000 mg | Freq: Once | INTRAMUSCULAR | Status: AC
  Administered 2023-11-08: 10 mg via INTRAVENOUS
  Filled 2023-11-08: qty 1

## 2023-11-08 MED ORDER — SODIUM CHLORIDE 0.9% FLUSH
10.0000 mL | Freq: Once | INTRAVENOUS | Status: AC
Start: 2023-11-08 — End: 2023-11-08
  Administered 2023-11-08: 10 mL via INTRAVENOUS

## 2023-11-08 MED ORDER — SODIUM CHLORIDE 0.9 % IV SOLN
65.0000 mg/m2 | Freq: Once | INTRAVENOUS | Status: AC
  Administered 2023-11-08: 114 mg via INTRAVENOUS
  Filled 2023-11-08: qty 19

## 2023-11-08 MED ORDER — HEPARIN SOD (PORK) LOCK FLUSH 100 UNIT/ML IV SOLN
500.0000 [IU] | Freq: Once | INTRAVENOUS | Status: AC | PRN
  Administered 2023-11-08: 500 [IU]

## 2023-11-08 MED ORDER — DIPHENHYDRAMINE HCL 50 MG/ML IJ SOLN
25.0000 mg | Freq: Once | INTRAMUSCULAR | Status: AC
  Administered 2023-11-08: 25 mg via INTRAVENOUS
  Filled 2023-11-08: qty 1

## 2023-11-08 NOTE — Progress Notes (Signed)
 Patient informed of antibiotics called in by Lillard Anes, NP.  Patient verbalized understanding.

## 2023-11-08 NOTE — Patient Instructions (Signed)

## 2023-11-08 NOTE — Assessment & Plan Note (Addendum)
 07/19/2023:Palpable left breast mass for 6 months.  Mammogram and ultrasound revealed large masses.   10:30 position: 4.3 cm with skin thickening and ulceration: Grade 2 IDC with high-grade DCIS ER 95%, PR 95%, Ki67 30%, HER2 2+ by IHC, FISH negative ratio 1.37;  5 o'clock position: 3.3 cm with calcifications spanning 6.7 cm, axilla negative, biopsy: Grade 3 IDC with necrosis ER 95% PR 90% Ki-67 60% HER2 1+ negative   CT CAP 08/03/2023: 2 prominent left breast masses, left axillary lymph nodes, right hepatic lobe lesion concerning for metastatic breast cancer Ultrasound a liver biopsy scheduled for 08/17/2023   Treatment plan: Neoadjuvant chemotherapy with dose Adriamycin  and Cytoxan  followed by Taxol  Mastectomy, left with prophylactic right mastectomy  Adjuvant radiation Antiestrogen therapy with CDK inhibitors -------------------------------------------------------------------------------------------------------------------------------------- Current treatment: Completed 4 cycles of dose dense Adriamycin  and Cytoxan , today is cycle 5 Taxol    Liver biopsy  08/17/2023.  Metastatic breast cancer ER 90%, PR 90%, Ki-67 45%, HER2 0 The reason for using definitive chemotherapy is because she has solitary liver metastasis.  Breast Cancer Undergoing weekly Taxol  chemotherapy, currently in week five. Tumor responding well to treatment, with noted improvement in physical characteristics. No significant side effects reported, except for mild diarrhea and occasional headaches, possibly due to dehydration. -Continue weekly Taxol  chemotherapy. -Monitor for any changes in tumor characteristics.  Urinary Symptoms Recent onset of urinary hesitancy, possibly related to chemotherapy. No pain, cloudiness, or odor reported. Urine sample collected for analysis. -Await results of urine analysis. -Advise patient to report any changes in urinary symptoms or onset of itching or odor.  Vision Changes Reports of  blurry vision, likely due to fluid shifts from steroid use in chemotherapy. No sudden vision loss or changes in peripheral or central vision. -Reassure patient that blurry vision is expected during treatment and should resolve a few weeks after completion of therapy. -Advise against changing glasses prescription during treatment.  Surgical Planning Patient expressed desire for bilateral mastectomy, with prophylactic right mastectomy, in discussion with surgeon. -Updated surgical plan in the note above to include prophylactic right mastectomy.  Izetta will return in 1 week for labs and treatment and we will see her with every other treatment.

## 2023-11-08 NOTE — Research (Signed)
 D7794, ICE COMPRESS: RANDOMIZED TRIAL OF LIMB CRYOCOMPRESSION VERSUS CONTINUOUS COMPRESSION VERSUS LOW CYCLIC COMPRESSION FOR THE PREVENTION  OF TAXANE-INDUCED PERIPHERAL NEUROPATHY  Patient arrives today Unaccompanied for the cycle 5 visit. Confirmed patient does not have wounds, sores, or lesions to extremities. Patient has not had any vaccinations since last visit.   ADVERSE EVENTS: Patient reports no adverse events from study intervention (Paxman machine). Pt denies any neuropathy symptoms (sensory or motor).    SOLICITED ADVERSE EVENTS:    Adverse Event CTCAE Grade Relationship to Study Intervention Action Taken Comments  Skin atrophy 0 NA      Skin hyperpigmentation 0 NA      Skin hypopigmentation 0 NA      Skin induration 0 NA      Skin ulceration 0 NA      Rash maculopapular 0 NA      Nail changes 0 NA      Cold intolerance (general disorders and administration site conditions- other) 0 NA      Frostbite (skin and subcutaneous tissue disorders- other) 0 NA      Fatigue 1 Not related to paxman  machine none Pt reports mild fatigue today. Stating she is still able to perform everyday task but does need intermittent rest periods.       STUDY INTERVENTION & TOLERABILITY ASSESSMENTS: 12:41pm- Wraps applied; pre-treatment started. Starting temperature 11 degrees Celsius. Starting pressure 5-15 mmHG. 12:51pm- Required 5-10 minute Tolerability check- pt states tolerable to cooling and compression 1:13 pm- Taxane infusion start time. Moved to treatment phase.  2:16 pm- Taxane infusion end time. Moved to post-treatment phase.  2:46 pm- Study intervention end time. Confirmed no new wounds, sores, or lesions.   DISPOSITION: Upon completion of all study requirements, patient remained in infusion chair with infusion RN Elenor with pt. Pt understands that research will meet her with cycle 6 that is scheduled for 11-13-22.    The patient was thanked for their time and continued voluntary  participation in this study. [name] has been provided direct contact information and is encouraged to contact this Nurse for any needs or questions.  Delon Pinal BSN RN Clinical Research Nurse [facility] Direct Dial: [phone] 11/08/2023  3:40 PM

## 2023-11-08 NOTE — Progress Notes (Signed)
 Ann Daugherty:    Job Lukes, [name] [id number] [place] [address]   DIAGNOSIS:  Cancer Staging  Malignant neoplasm of upper-inner quadrant of left female breast [facility]) Staging form: Breast, AJCC 8th Edition - Clinical: Stage IIIB (cT4b, cN0, cM0, G3, ER+, PR+, HER2-) - Signed by [name], MD on 07/26/2023 Histologic grading system: 3 grade system - Pathologic: Stage IV (pM1) - Signed by Crawford Morna Pickle, NP on 08/21/2023   SUMMARY OF ONCOLOGIC HISTORY: Oncology History  Malignant neoplasm of upper-inner quadrant of left female breast (HCC)  07/19/2023 Initial Diagnosis   Palpable left breast mass for 6 months.  Mammogram and ultrasound revealed large masses.  10:30 position: 4.3 cm with skin thickening and ulceration: Grade 2 IDC with high-grade DCIS ER 95%, PR 95%, Ki67 30%, HER2 2+ by IHC, FISH negative ratio 1.37; 5 o'clock position: 3.3 cm with calcifications spanning 6.7 cm, axilla negative, biopsy: Grade 3 IDC with necrosis ER 95% PR 90% Ki-67 60% HER2 1+ negative   07/26/2023 Cancer Staging   Staging form: Breast, AJCC 8th Edition - Clinical: Stage IIIB (cT4b, cN0, cM0, G3, ER+, PR+, HER2-) - Signed by [name], MD on 07/26/2023 Histologic grading system: 3 grade system   08/15/2023 -  Chemotherapy   Patient is on Treatment Plan : BREAST ADJUVANT DOSE DENSE AC q14d / PACLitaxel  q7d     08/17/2023 Initial Biopsy   Liver biopsy: consistent with metastatic carcinoma, breast primary.     08/21/2023 Cancer Staging   Staging form: Breast, AJCC 8th Edition - Pathologic: Stage IV (pM1) - Signed by Crawford Morna Pickle, NP on 08/21/2023     CURRENT THERAPY: neoadjuvant chemotherapy, Taxol , week 5  INTERVAL HISTORY:  Discussed the use of AI scribe software for clinical note transcription with the patient, who gave verbal consent to proceed.  Ann Daugherty 53 y.o. female currently undergoing chemotherapy for  breast cancer, is on her fifth week of weekly Taxol  treatment. She reports no numbness in the fingertips or toes. She has been experiencing diarrhea, with one to two episodes daily, and occasional headaches, which she attributes to possible dehydration. She has noticed some hair regrowth, which she describes as peach fuzz.  Over the past weekend, the patient experienced urinary symptoms, including a sensation of needing to urinate frequently, but with only small amounts of urine produced. There was no pain during urination, and the urine did not appear cloudy. The patient has previously experienced similar symptoms and managed them with cranberry tablets or juice.  The patient also reports some blurring of vision, which she describes as a minor issue. She has also noticed a change in the tumor, describing it as significantly decreasing in size and having less defined edges than before.  The patient has been managing well with the chemotherapy, reporting increased energy levels compared to her initial treatments. She has noticed some cognitive changes, including difficulty with word recall and maintaining her train of thought, which she describes as a fog.    Patient Active Problem List   Diagnosis Date Noted   Port-A-Cath in place 08/15/2023   Malignant neoplasm of upper-inner quadrant of left female breast (HCC) 07/20/2023   Depression, major, single episode, mild (HCC) 04/05/2021   Tobacco abuse 01/30/2018   Anxiety 01/30/2018   Attention deficit disorder 01/30/2018   Vitamin D  deficiency 01/30/2018    has no known allergies.  MEDICAL HISTORY: Past Medical History:  Diagnosis Date   ADD (attention  deficit disorder) 2015   Does not take any medication   Anxiety    Cancer (HCC)    breast   Depression    GERD (gastroesophageal reflux disease)    History of chicken pox    PONV (postoperative nausea and vomiting)     SURGICAL HISTORY: Past Surgical History:  Procedure  Laterality Date   BACK SURGERY     BREAST BIOPSY Left 07/19/2023   US  LT BREAST BX W LOC DEV EA ADD LESION IMG BX SPEC US  GUIDE 07/19/2023 [facility]   BREAST BIOPSY Left 07/19/2023   US  LT BREAST BX W LOC DEV 1ST LESION IMG BX SPEC US  GUIDE 07/19/2023 [facility]   BREAST BIOPSY Right 08/09/2023   US  RT BREAST BX W LOC DEV 1ST LESION IMG BX SPEC US  GUIDE 08/09/2023 [facility]   BREAST BIOPSY Right 08/09/2023   US  RT BREAST BX W LOC DEV EA ADD LESION IMG BX SPEC US  GUIDE 08/09/2023 [facility]   LAMINECTOMY     2007, 2011   PORTACATH PLACEMENT Right 08/14/2023   Procedure: INSERTION PORT-A-CATH WITH ULTRASOUND GUIDEANCE;  Surgeon: Aron Shoulders, MD;  Location: [facility] OR;  Service: General;  Laterality: Right;   VAGINAL DELIVERY  2004, 2006    SOCIAL HISTORY: Social History   Socioeconomic History   Marital status: Divorced    Spouse name: Not on file   Number of children: 2   Years of education: Not on file   Highest education level: Not on file  Occupational History   Not on file  Tobacco Use   Smoking status: Former    Current packs/day: 0.00    Average packs/day: 0.5 packs/day for 30.0 years (15.0 ttl pk-yrs)    Types: Cigarettes    Start date: 04/07/1991    Quit date: 04/06/2021    Years since quitting: 2.5   Smokeless tobacco: Current  Vaping Use   Vaping status: Every Day   Substances: Nicotine  Substance and Sexual Activity   Alcohol use: Yes    Comment: 3-4 drinks a year   Drug use: No   Sexual activity: Not Currently    Birth control/protection: None  Other Topics Concern   Not on file  Social History Narrative   81 and 28 y/o sons   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   Social Drivers of Health   Financial Resource Strain: Not on file  Food Insecurity: Food Insecurity Present (07/27/2023)   Hunger Vital Sign    Worried About Running Out of Food in the Last Year: Sometimes true    Ran Out of Food in the Last Year: Sometimes true   Transportation Needs: No Transportation Needs (07/27/2023)   PRAPARE - Administrator, Civil Service (Medical): No    Lack of Transportation (Non-Medical): No  Physical Activity: Not on file  Stress: Not on file  Social Connections: Not on file  Intimate Partner Violence: Not At Risk (07/27/2023)   Humiliation, Afraid, Rape, and Kick questionnaire    Fear of Current or Ex-Partner: No    Emotionally Abused: No    Physically Abused: No    Sexually Abused: No    FAMILY HISTORY: Family History  Problem Relation Age of Onset   Breast cancer Mother 12   Colon cancer Mother 31   Asthma Mother    Hypertension Mother    Hearing loss Mother    Hypertension Father    Diabetes Father    Hyperlipidemia Father  Learning disabilities Son    ADD / ADHD Son    Ovarian cancer Maternal Grandmother    Hypertension Maternal Grandfather    Hyperlipidemia Maternal Grandfather    Heart disease Maternal Grandfather    Stroke Maternal Grandfather    Mental illness Paternal Grandmother    Hypertension Paternal Grandfather     Review of Systems  Constitutional:  Positive for fatigue. Negative for appetite change, chills, fever and unexpected weight change.  HENT:   Negative for hearing loss, lump/mass and trouble swallowing.   Eyes:  Negative for eye problems and icterus.  Respiratory:  Negative for chest tightness, cough and shortness of breath.   Cardiovascular:  Negative for chest pain, leg swelling and palpitations.  Gastrointestinal:  Negative for abdominal distention, abdominal pain, constipation, diarrhea, nausea and vomiting.  Endocrine: Negative for hot flashes.  Genitourinary:  Positive for difficulty urinating.   Musculoskeletal:  Negative for arthralgias.  Skin:  Negative for itching and rash.  Neurological:  Negative for dizziness, extremity weakness, headaches and numbness.  Hematological:  Negative for adenopathy. Does not bruise/bleed easily.   Psychiatric/Behavioral:  Negative for depression. The patient is not nervous/anxious.       PHYSICAL EXAMINATION    Vitals:   11/08/23 1107  BP: 112/84  Pulse: 80  Resp: 19  Temp: 98.1 F (36.7 C)  SpO2: 97%    Physical Exam Constitutional:      General: She is not in acute distress.    Appearance: Normal appearance. She is not toxic-appearing.  HENT:     Head: Normocephalic and atraumatic.     Mouth/Throat:     Mouth: Mucous membranes are moist.     Pharynx: Oropharynx is clear. No oropharyngeal exudate or posterior oropharyngeal erythema.  Eyes:     General: No scleral icterus. Cardiovascular:     Rate and Rhythm: Normal rate and regular rhythm.     Pulses: Normal pulses.     Heart sounds: Normal heart sounds.  Pulmonary:     Effort: Pulmonary effort is normal.     Breath sounds: Normal breath sounds.  Abdominal:     General: Abdomen is flat. Bowel sounds are normal. There is no distension.     Palpations: Abdomen is soft.     Tenderness: There is no abdominal tenderness.  Musculoskeletal:        General: No swelling.     Cervical back: Neck supple.  Lymphadenopathy:     Cervical: No cervical adenopathy.  Skin:    General: Skin is warm and dry.     Findings: No rash.  Neurological:     General: No focal deficit present.     Mental Status: She is alert.  Psychiatric:        Mood and Affect: Mood normal.        Behavior: Behavior normal.     LABORATORY DATA:  CBC    Component Value Date/Time   WBC 5.2 11/08/2023 1011   WBC 7.2 08/17/2023 1115   RBC 3.21 (L) 11/08/2023 1011   HGB 10.3 (L) 11/08/2023 1011   HCT 30.9 (L) 11/08/2023 1011   PLT 332 11/08/2023 1011   MCV 96.3 11/08/2023 1011   MCH 32.1 11/08/2023 1011   MCHC 33.3 11/08/2023 1011   RDW 17.0 (H) 11/08/2023 1011   LYMPHSABS 0.8 11/08/2023 1011   MONOABS 0.4 11/08/2023 1011   EOSABS 0.3 11/08/2023 1011   BASOSABS 0.0 11/08/2023 1011    CMP     Component Value Date/Time  NA 137  11/08/2023 1011   K 3.8 11/08/2023 1011   CL 106 11/08/2023 1011   CO2 27 11/08/2023 1011   GLUCOSE 106 (H) 11/08/2023 1011   BUN 10 11/08/2023 1011   CREATININE 0.59 11/08/2023 1011   CREATININE 0.63 08/09/2020 1032   CALCIUM 8.9 11/08/2023 1011   PROT 6.4 (L) 11/08/2023 1011   ALBUMIN  3.9 11/08/2023 1011   AST 17 11/08/2023 1011   ALT 18 11/08/2023 1011   ALKPHOS 75 11/08/2023 1011   BILITOT 0.2 11/08/2023 1011   GFRNONAA >60 11/08/2023 1011     ASSESSMENT and THERAPY PLAN:   Malignant neoplasm of upper-inner quadrant of left female breast (HCC) 07/19/2023:Palpable left breast mass for 6 months.  Mammogram and ultrasound revealed large masses.   10:30 position: 4.3 cm with skin thickening and ulceration: Grade 2 IDC with high-grade DCIS ER 95%, PR 95%, Ki67 30%, HER2 2+ by IHC, FISH negative ratio 1.37;  5 o'clock position: 3.3 cm with calcifications spanning 6.7 cm, axilla negative, biopsy: Grade 3 IDC with necrosis ER 95% PR 90% Ki-67 60% HER2 1+ negative   CT CAP 08/03/2023: 2 prominent left breast masses, left axillary lymph nodes, right hepatic lobe lesion concerning for metastatic breast cancer Ultrasound a liver biopsy scheduled for 08/17/2023   Treatment plan: Neoadjuvant chemotherapy with dose Adriamycin  and Cytoxan  followed by Taxol  Mastectomy, left with prophylactic right mastectomy  Adjuvant radiation Antiestrogen therapy with CDK inhibitors -------------------------------------------------------------------------------------------------------------------------------------- Current treatment: Completed 4 cycles of dose dense Adriamycin  and Cytoxan , today is cycle 5 Taxol    Liver biopsy  08/17/2023.  Metastatic breast cancer ER 90%, PR 90%, Ki-67 45%, HER2 0 The reason for using definitive chemotherapy is because she has solitary liver metastasis.  Breast Cancer Undergoing weekly Taxol  chemotherapy, currently in week five. Tumor responding well to treatment, with  noted improvement in physical characteristics. No significant side effects reported, except for mild diarrhea and occasional headaches, possibly due to dehydration. -Continue weekly Taxol  chemotherapy. -Monitor for any changes in tumor characteristics.  Urinary Symptoms Recent onset of urinary hesitancy, possibly related to chemotherapy. No pain, cloudiness, or odor reported. Urine sample collected for analysis. -Await results of urine analysis. -Advise patient to report any changes in urinary symptoms or onset of itching or odor.  Vision Changes Reports of blurry vision, likely due to fluid shifts from steroid use in chemotherapy. No sudden vision loss or changes in peripheral or central vision. -Reassure patient that blurry vision is expected during treatment and should resolve a few weeks after completion of therapy. -Advise against changing glasses prescription during treatment.  Surgical Planning Patient expressed desire for bilateral mastectomy, with prophylactic right mastectomy, in discussion with surgeon. -Updated surgical plan in the note above to include prophylactic right mastectomy.  Izetta will return in 1 week for labs and treatment and we will see her with every other treatment.    All questions were answered. The patient knows to call the clinic with any problems, questions or concerns. We can certainly see the patient much sooner if necessary.  Total encounter time:20 minutes*in face-to-face visit time, chart review, lab review, care coordination, order entry, and documentation of the encounter time.    Morna Kendall, NP 11/08/23 3:34 PM Medical Oncology and Hematology [facility] [address] [place], [place] [address] Tel. [phone]    Fax. [phone]  *Total Encounter Time as defined by the Centers for Medicare and Medicaid Services includes, in addition to the face-to-face time of a patient visit (  documented in the note above)  non-face-to-face time: obtaining and reviewing outside history, ordering and reviewing medications, tests or procedures, care coordination (communications with other health care professionals or caregivers) and documentation in the medical record.

## 2023-11-09 LAB — URINE CULTURE: Culture: <10000 — AB

## 2023-11-14 ENCOUNTER — Inpatient Hospital Stay: Payer: Medicaid Other

## 2023-11-14 VITALS — BP 105/71 | HR 91 | Temp 98.0°F

## 2023-11-14 DIAGNOSIS — Z95828 Presence of other vascular implants and grafts: Secondary | ICD-10-CM

## 2023-11-14 DIAGNOSIS — C50212 Malignant neoplasm of upper-inner quadrant of left female breast: Secondary | ICD-10-CM

## 2023-11-14 DIAGNOSIS — Z5111 Encounter for antineoplastic chemotherapy: Secondary | ICD-10-CM | POA: Diagnosis not present

## 2023-11-14 DIAGNOSIS — Z170 Estrogen receptor positive status [ER+]: Secondary | ICD-10-CM | POA: Diagnosis not present

## 2023-11-14 DIAGNOSIS — F17290 Nicotine dependence, other tobacco product, uncomplicated: Secondary | ICD-10-CM | POA: Diagnosis not present

## 2023-11-14 DIAGNOSIS — C787 Secondary malignant neoplasm of liver and intrahepatic bile duct: Secondary | ICD-10-CM | POA: Diagnosis not present

## 2023-11-14 LAB — CBC WITH DIFFERENTIAL (CANCER CENTER ONLY)
Abs Immature Granulocytes: 0.02 10*3/uL (ref 0.00–0.07)
Basophils Absolute: 0 10*3/uL (ref 0.0–0.1)
Basophils Relative: 1 %
Eosinophils Absolute: 0.1 10*3/uL (ref 0.0–0.5)
Eosinophils Relative: 2 %
HCT: 33 % — ABNORMAL LOW (ref 36.0–46.0)
Hemoglobin: 11.1 g/dL — ABNORMAL LOW (ref 12.0–15.0)
Immature Granulocytes: 0 %
Lymphocytes Relative: 18 %
Lymphs Abs: 0.9 10*3/uL (ref 0.7–4.0)
MCH: 32.2 pg (ref 26.0–34.0)
MCHC: 33.6 g/dL (ref 30.0–36.0)
MCV: 95.7 fL (ref 80.0–100.0)
Monocytes Absolute: 0.4 10*3/uL (ref 0.1–1.0)
Monocytes Relative: 9 %
Neutro Abs: 3.4 10*3/uL (ref 1.7–7.7)
Neutrophils Relative %: 70 %
Platelet Count: 328 10*3/uL (ref 150–400)
RBC: 3.45 MIL/uL — ABNORMAL LOW (ref 3.87–5.11)
RDW: 15.7 % — ABNORMAL HIGH (ref 11.5–15.5)
WBC Count: 4.8 10*3/uL (ref 4.0–10.5)
nRBC: 0 % (ref 0.0–0.2)

## 2023-11-14 LAB — CMP (CANCER CENTER ONLY)
ALT: 22 U/L (ref 0–44)
AST: 23 U/L (ref 15–41)
Albumin: 4.1 g/dL (ref 3.5–5.0)
Alkaline Phosphatase: 81 U/L (ref 38–126)
Anion gap: 6 (ref 5–15)
BUN: 12 mg/dL (ref 6–20)
CO2: 27 mmol/L (ref 22–32)
Calcium: 9 mg/dL (ref 8.9–10.3)
Chloride: 102 mmol/L (ref 98–111)
Creatinine: 0.67 mg/dL (ref 0.44–1.00)
GFR, Estimated: >60 mL/min (ref >60)
Glucose, Bld: 93 mg/dL (ref 70–99)
Potassium: 3.9 mmol/L (ref 3.5–5.1)
Sodium: 135 mmol/L (ref 135–145)
Total Bilirubin: 0.4 mg/dL (ref 0.0–1.2)
Total Protein: 6.7 g/dL (ref 6.5–8.1)

## 2023-11-14 MED ORDER — SODIUM CHLORIDE 0.9 % IV SOLN: Freq: Once | INTRAVENOUS | Status: AC

## 2023-11-14 MED ORDER — HEPARIN SOD (PORK) LOCK FLUSH 100 UNIT/ML IV SOLN
500.0000 [IU] | Freq: Once | INTRAVENOUS | Status: AC | PRN
Start: 2023-11-14 — End: 2023-11-14
  Administered 2023-11-14: 500 [IU]

## 2023-11-14 MED ORDER — SODIUM CHLORIDE 0.9% FLUSH
10.0000 mL | Freq: Once | INTRAVENOUS | Status: AC
  Administered 2023-11-14: 10 mL

## 2023-11-14 MED ORDER — DIPHENHYDRAMINE HCL 50 MG/ML IJ SOLN
25.0000 mg | Freq: Once | INTRAMUSCULAR | Status: AC
  Administered 2023-11-14: 25 mg via INTRAVENOUS
  Filled 2023-11-14: qty 1

## 2023-11-14 MED ORDER — DEXAMETHASONE SODIUM PHOSPHATE 10 MG/ML IJ SOLN
10.0000 mg | Freq: Once | INTRAMUSCULAR | Status: AC
Start: 2023-11-14 — End: 2023-11-14
  Administered 2023-11-14: 10 mg via INTRAVENOUS
  Filled 2023-11-14: qty 1

## 2023-11-14 MED ORDER — SODIUM CHLORIDE 0.9 % IV SOLN
65.0000 mg/m2 | Freq: Once | INTRAVENOUS | Status: AC
  Administered 2023-11-14: 114 mg via INTRAVENOUS
  Filled 2023-11-14: qty 19

## 2023-11-14 MED ORDER — FAMOTIDINE IN NACL 20-0.9 MG/50ML-% IV SOLN
20.0000 mg | Freq: Once | INTRAVENOUS | Status: AC
  Administered 2023-11-14: 20 mg via INTRAVENOUS
  Filled 2023-11-14: qty 50

## 2023-11-14 MED ORDER — SODIUM CHLORIDE 0.9% FLUSH
10.0000 mL | INTRAVENOUS | Status: DC | PRN
  Administered 2023-11-14: 10 mL

## 2023-11-14 NOTE — Research (Signed)
 D7794, ICE COMPRESS: RANDOMIZED TRIAL OF LIMB CRYOCOMPRESSION VERSUS CONTINUOUS COMPRESSION VERSUS LOW CYCLIC COMPRESSION FOR THE PREVENTION  OF TAXANE-INDUCED PERIPHERAL NEUROPATHY   Patient arrives today Unaccompanied for the cycle 6 visit. Confirmed patient does not have wounds, sores, or lesions to extremities. Patient has not had any vaccinations since last visit.   ADVERSE EVENTS: Patient reports no adverse events from study intervention (Paxman machine). Pt denies any neuropathy symptoms (sensory or motor).    SOLICITED ADVERSE EVENTS:    Adverse Event CTCAE Grade Relationship to Study Intervention Action Taken Comments  Skin atrophy 0 NA      Skin hyperpigmentation 0 NA      Skin hypopigmentation 0 NA      Skin induration 0 NA      Skin ulceration 0 NA      Rash maculopapular 0 NA      Nail changes 0 NA      Cold intolerance (general disorders and administration site conditions- other) 0 NA      Frostbite (skin and subcutaneous tissue disorders- other) 0 NA      Fatigue 1 Not related to paxman  machine none Pt reports mild fatigue today. Stating she is still able to perform everyday task but does need intermittent rest periods.       STUDY INTERVENTION & TOLERABILITY ASSESSMENTS: 1:46pm- Wraps applied; pre-treatment started. Starting temperature 11 degrees Celsius. Starting pressure 5-15 mmHG. 1:56pm- Required 5-10 minute Tolerability check- pt states tolerable to cooling and compression 2:17 pm- Taxane infusion start time. Moved to treatment phase. 3:05 pm-Pt took bathroom break 3:10 pm-Intervention restarted after bathroom break.  3:26 pm- Taxane infusion end time. Moved to post-treatment phase. Bathroom break time covered  during this time.  4:10 pm- Study intervention end time. Confirmed no new wounds, sores, or lesions.   DISPOSITION: Upon completion of all study requirements, patient remained in infusion chair with infusion RN Myrtle at pt bedside. Pt understands that  research will meet her with cycle 7 that is scheduled for 11-21-23. Pt knows she will get PRO's, research blood draw, and neuropathy assessments as well.    The patient was thanked for their time and continued voluntary participation in this study. [name] has been provided direct contact information and is encouraged to contact this Nurse for any needs or questions.  Delon Pinal BSN RN Clinical Research Nurse [facility] Direct Dial: [phone] 11/15/2023  8:34 AM

## 2023-11-14 NOTE — Patient Instructions (Signed)

## 2023-11-15 ENCOUNTER — Encounter: Payer: Self-pay | Admitting: Hematology and Oncology

## 2023-11-15 ENCOUNTER — Other Ambulatory Visit: Payer: Self-pay

## 2023-11-15 DIAGNOSIS — C50212 Malignant neoplasm of upper-inner quadrant of left female breast: Secondary | ICD-10-CM

## 2023-11-15 NOTE — Progress Notes (Signed)
 Research labs ordered per protocol

## 2023-11-21 ENCOUNTER — Inpatient Hospital Stay (HOSPITAL_BASED_OUTPATIENT_CLINIC_OR_DEPARTMENT_OTHER): Payer: Medicaid Other | Admitting: Adult Health

## 2023-11-21 ENCOUNTER — Inpatient Hospital Stay: Payer: Medicaid Other

## 2023-11-21 ENCOUNTER — Encounter: Payer: Self-pay | Admitting: Adult Health

## 2023-11-21 VITALS — BP 108/55 | HR 69 | Resp 18 | Wt 145.3 lb

## 2023-11-21 DIAGNOSIS — R399 Unspecified symptoms and signs involving the genitourinary system: Secondary | ICD-10-CM

## 2023-11-21 DIAGNOSIS — C787 Secondary malignant neoplasm of liver and intrahepatic bile duct: Secondary | ICD-10-CM | POA: Diagnosis not present

## 2023-11-21 DIAGNOSIS — C50212 Malignant neoplasm of upper-inner quadrant of left female breast: Secondary | ICD-10-CM

## 2023-11-21 DIAGNOSIS — Z95828 Presence of other vascular implants and grafts: Secondary | ICD-10-CM

## 2023-11-21 DIAGNOSIS — F17290 Nicotine dependence, other tobacco product, uncomplicated: Secondary | ICD-10-CM | POA: Diagnosis not present

## 2023-11-21 DIAGNOSIS — Z5111 Encounter for antineoplastic chemotherapy: Secondary | ICD-10-CM | POA: Diagnosis not present

## 2023-11-21 DIAGNOSIS — Z170 Estrogen receptor positive status [ER+]: Secondary | ICD-10-CM | POA: Diagnosis not present

## 2023-11-21 LAB — URINALYSIS, COMPLETE (UACMP) WITH MICROSCOPIC
Bilirubin Urine: NEGATIVE
Glucose, UA: NEGATIVE mg/dL
Hgb urine dipstick: NEGATIVE
Ketones, ur: NEGATIVE mg/dL
Nitrite: NEGATIVE
Protein, ur: NEGATIVE mg/dL
Specific Gravity, Urine: 1.008 (ref 1.005–1.030)
pH: 6 (ref 5.0–8.0)

## 2023-11-21 LAB — CMP (CANCER CENTER ONLY)
ALT: 17 U/L (ref 0–44)
AST: 17 U/L (ref 15–41)
Albumin: 4 g/dL (ref 3.5–5.0)
Alkaline Phosphatase: 66 U/L (ref 38–126)
Anion gap: 4 — ABNORMAL LOW (ref 5–15)
BUN: 9 mg/dL (ref 6–20)
CO2: 29 mmol/L (ref 22–32)
Calcium: 9.1 mg/dL (ref 8.9–10.3)
Chloride: 102 mmol/L (ref 98–111)
Creatinine: 0.61 mg/dL (ref 0.44–1.00)
GFR, Estimated: >60 mL/min (ref >60)
Glucose, Bld: 95 mg/dL (ref 70–99)
Potassium: 3.8 mmol/L (ref 3.5–5.1)
Sodium: 135 mmol/L (ref 135–145)
Total Bilirubin: 0.4 mg/dL (ref 0.0–1.2)
Total Protein: 6.5 g/dL (ref 6.5–8.1)

## 2023-11-21 LAB — CBC WITH DIFFERENTIAL (CANCER CENTER ONLY)
Abs Immature Granulocytes: 0.03 10*3/uL (ref 0.00–0.07)
Basophils Absolute: 0 10*3/uL (ref 0.0–0.1)
Basophils Relative: 1 %
Eosinophils Absolute: 0.1 10*3/uL (ref 0.0–0.5)
Eosinophils Relative: 1 %
HCT: 31.3 % — ABNORMAL LOW (ref 36.0–46.0)
Hemoglobin: 10.5 g/dL — ABNORMAL LOW (ref 12.0–15.0)
Immature Granulocytes: 1 %
Lymphocytes Relative: 15 %
Lymphs Abs: 0.8 10*3/uL (ref 0.7–4.0)
MCH: 32.6 pg (ref 26.0–34.0)
MCHC: 33.5 g/dL (ref 30.0–36.0)
MCV: 97.2 fL (ref 80.0–100.0)
Monocytes Absolute: 0.4 10*3/uL (ref 0.1–1.0)
Monocytes Relative: 6 %
Neutro Abs: 4.3 10*3/uL (ref 1.7–7.7)
Neutrophils Relative %: 76 %
Platelet Count: 292 10*3/uL (ref 150–400)
RBC: 3.22 MIL/uL — ABNORMAL LOW (ref 3.87–5.11)
RDW: 14.8 % (ref 11.5–15.5)
WBC Count: 5.6 10*3/uL (ref 4.0–10.5)
nRBC: 0 % (ref 0.0–0.2)

## 2023-11-21 LAB — PREGNANCY, URINE: Preg Test, Ur: NEGATIVE

## 2023-11-21 LAB — RESEARCH LABS

## 2023-11-21 MED ORDER — HEPARIN SOD (PORK) LOCK FLUSH 100 UNIT/ML IV SOLN
500.0000 [IU] | Freq: Once | INTRAVENOUS | Status: AC | PRN
  Administered 2023-11-21: 500 [IU]

## 2023-11-21 MED ORDER — SODIUM CHLORIDE 0.9% FLUSH
10.0000 mL | INTRAVENOUS | Status: DC | PRN
  Administered 2023-11-21: 10 mL

## 2023-11-21 MED ORDER — FAMOTIDINE IN NACL 20-0.9 MG/50ML-% IV SOLN
20.0000 mg | Freq: Once | INTRAVENOUS | Status: AC
  Administered 2023-11-21: 20 mg via INTRAVENOUS
  Filled 2023-11-21: qty 50

## 2023-11-21 MED ORDER — PACLITAXEL CHEMO INJECTION 300 MG/50ML
65.0000 mg/m2 | Freq: Once | INTRAVENOUS | Status: AC
  Administered 2023-11-21: 114 mg via INTRAVENOUS
  Filled 2023-11-21: qty 19

## 2023-11-21 MED ORDER — SODIUM CHLORIDE 0.9% FLUSH
10.0000 mL | Freq: Once | INTRAVENOUS | Status: AC
  Administered 2023-11-21: 10 mL

## 2023-11-21 MED ORDER — DIPHENHYDRAMINE HCL 50 MG/ML IJ SOLN
25.0000 mg | Freq: Once | INTRAMUSCULAR | Status: AC
  Administered 2023-11-21: 25 mg via INTRAVENOUS
  Filled 2023-11-21: qty 1

## 2023-11-21 MED ORDER — DEXAMETHASONE SODIUM PHOSPHATE 10 MG/ML IJ SOLN
10.0000 mg | Freq: Once | INTRAMUSCULAR | Status: AC
  Administered 2023-11-21: 10 mg via INTRAVENOUS
  Filled 2023-11-21: qty 1

## 2023-11-21 MED ORDER — SODIUM CHLORIDE 0.9 % IV SOLN: Freq: Once | INTRAVENOUS | Status: AC

## 2023-11-21 NOTE — Research (Addendum)
[name], ICE COMPRESS: RANDOMIZED TRIAL OF LIMB CRYOCOMPRESSION VERSUS CONTINUOUS COMPRESSION VERSUS LOW CYCLIC COMPRESSION FOR THE PREVENTION  OF TAXANE-INDUCED PERIPHERAL NEUROPATHY   Patient arrives today Unaccompanied for the cycle 7 visit. Confirmed patient does not have wounds, sores, or lesions to extremities. Patient has not had any vaccinations since last visit.   ADVERSE EVENTS: Patient reports no adverse events from study intervention (Paxman machine). Pt denies any neuropathy symptoms (sensory or motor).    SOLICITED ADVERSE EVENTS:    Adverse Event CTCAE Grade Relationship to Study Intervention Action Taken Comments  Skin atrophy 0 NA      Skin hyperpigmentation 0 NA      Skin hypopigmentation 0 NA      Skin induration 0 NA      Skin ulceration 0 NA      Rash maculopapular 0 NA      Nail changes 0 NA      Cold intolerance (general disorders and administration site conditions- other) 0 NA      Frostbite (skin and subcutaneous tissue disorders- other) 0 NA      Fatigue 1 Not related to paxman  machine none Pt reports mild fatigue today. Stating she is still able to perform everyday task but does need intermittent rest periods.     PROs:  Per study protocol, all PROs required for this visit were completed prior to other study activities and completeness has been verified.     LABS: Optional labs were collected per consent and study protocol: [name] tolerated well without complaint.  MD/PROVIDER VISIT: Patient sees Lillard Anes NP for today's visit.   STUDY INTERVENTION & TOLERABILITY ASSESSMENTS: 11:20 AM- Wraps applied; pre-treatment started. Starting temperature 11 degrees Celsius. Starting pressure 5-15 mmHG. 11:30 AM- Required 5-10 minute Tolerability check- pt states tolerable to cooling and compression 11:54 AM- Taxane infusion start time. Moved to treatment phase. 1:00 pm- Taxane infusion end time. Moved to post-treatment phase.  1:30 pm- Study  intervention end time. Confirmed no new wounds, sores, or lesions.   DISPOSITION: Upon completion of all study requirements, patient remained in infusion chair with infusion RN Hunter at pt bedside. Pt understands that research will meet her with cycle 8 that is scheduled for 11-28-23.    The patient was thanked for their time and continued voluntary participation in this study. [name] has been provided direct contact information and is encouraged to contact this Nurse for any needs or questions.  Zerita Boers BSN RN Clinical Research Nurse [facility] Direct Dial: [phone] 11/21/2023  1:52 PM

## 2023-11-21 NOTE — Progress Notes (Signed)
[facility] Cancer Follow up:    Ann Daugherty, [name] [address] [place] [address]   DIAGNOSIS:  Cancer Staging  Malignant neoplasm of upper-inner quadrant of left female breast [facility]) Staging form: Breast, AJCC 8th Edition - Clinical: Stage IIIB (cT4b, cN0, cM0, G3, ER+, PR+, HER2-) - Signed by Serena Croissant, MD on 07/26/2023 Histologic grading system: 3 grade system - Pathologic: Stage IV (pM1) - Signed by Loa Socks, NP on 08/21/2023   SUMMARY OF ONCOLOGIC HISTORY: Oncology History  Malignant neoplasm of upper-inner quadrant of left female breast (HCC)  07/19/2023 Initial Diagnosis   Palpable left breast mass for 6 months.  Mammogram and ultrasound revealed large masses.  10:30 position: 4.3 cm with skin thickening and ulceration: Grade 2 IDC with high-grade DCIS ER 95%, PR 95%, Ki67 30%, HER2 2+ by IHC, FISH negative ratio 1.37; 5 o'clock position: 3.3 cm with calcifications spanning 6.7 cm, axilla negative, biopsy: Grade 3 IDC with necrosis ER 95% PR 90% Ki-67 60% HER2 1+ negative   07/26/2023 Cancer Staging   Staging form: Breast, AJCC 8th Edition - Clinical: Stage IIIB (cT4b, cN0, cM0, G3, ER+, PR+, HER2-) - Signed by Serena Croissant, MD on 07/26/2023 Histologic grading system: 3 grade system   08/15/2023 -  Chemotherapy   Patient is on Treatment Plan : BREAST ADJUVANT DOSE DENSE AC q14d / PACLitaxel q7d     08/17/2023 Initial Biopsy   Liver biopsy: consistent with metastatic carcinoma, breast primary.     08/21/2023 Cancer Staging   Staging form: Breast, AJCC 8th Edition - Pathologic: Stage IV (pM1) - Signed by Loa Socks, NP on 08/21/2023     CURRENT THERAPY: week 7 taxol  INTERVAL HISTORY:  Discussed the use of AI scribe software for clinical note transcription with the patient, who gave verbal consent to proceed.  Ann Daugherty 53 y.o. female currently in week seven of chemotherapy (Taxol) for breast cancer,  reports increased fatigue. She denies any numbness or tingling in the fingertips or toes since the dose reduction. She also reports dryness, which she is managing with lip balm, lotion, and a humidifier.  The patient recently completed a course of antibiotics for a UTI and would like to confirm resolution with a repeat urinalysis. She reports that her symptoms had resolved prior to the initial positive test and she wants to ensure the eight-day course of antibiotics was effective.  The patient also inquires about the timeline for mastectomy following the completion of chemotherapy, which is typically about four weeks. She expresses concern about scheduling and imaging prior to the surgery. She also asks about the need for a repeat echo, which is typically done after radiation if the cancer is on the left side.  The patient also mentions knee pain after the first round of chemotherapy, which resolved after the dose was reduced. She has been trying to boost her protein intake and her labs have been stable.   Patient Active Problem List   Diagnosis Date Noted   Port-A-Cath in place 08/15/2023   Malignant neoplasm of upper-inner quadrant of left female breast (HCC) 07/20/2023   Depression, major, single episode, mild (HCC) 04/05/2021   Tobacco abuse 01/30/2018   Anxiety 01/30/2018   Attention deficit disorder 01/30/2018   Vitamin D deficiency 01/30/2018    has no known allergies.  MEDICAL HISTORY: Past Medical History:  Diagnosis Date   ADD (attention deficit disorder) 2015   Does not take any medication   Anxiety  Cancer (HCC)    breast   Depression    GERD (gastroesophageal reflux disease)    History of chicken pox    PONV (postoperative nausea and vomiting)     SURGICAL HISTORY: Past Surgical History:  Procedure Laterality Date   BACK SURGERY     BREAST BIOPSY Left 07/19/2023   [place] LT BREAST BX W LOC DEV EA ADD LESION IMG BX SPEC US GUIDE 07/19/2023 [facility]   BREAST  BIOPSY Left 07/19/2023   [place] LT BREAST BX W LOC DEV 1ST LESION IMG BX SPEC US GUIDE 07/19/2023 [facility]   BREAST BIOPSY Right 08/09/2023   [place] RT BREAST BX W LOC DEV 1ST LESION IMG BX SPEC US GUIDE 08/09/2023 [facility]   BREAST BIOPSY Right 08/09/2023   [place] RT BREAST BX W LOC DEV EA ADD LESION IMG BX SPEC US GUIDE 08/09/2023 [facility]   LAMINECTOMY     2007, 2011   PORTACATH PLACEMENT Right 08/14/2023   Procedure: INSERTION PORT-A-CATH WITH ULTRASOUND GUIDEANCE;  Surgeon: Almond Lint, MD;  Location: [facility] OR;  Service: General;  Laterality: Right;   VAGINAL DELIVERY  2004, 2006    SOCIAL HISTORY: Social History   Socioeconomic History   Marital status: Divorced    Spouse name: Not on file   Number of children: 2   Years of education: Not on file   Highest education level: Not on file  Occupational History   Not on file  Tobacco Use   Smoking status: Former    Current packs/day: 0.00    Average packs/day: 0.5 packs/day for 30.0 years (15.0 ttl pk-yrs)    Types: Cigarettes    Start date: 04/07/1991    Quit date: 04/06/2021    Years since quitting: 2.6   Smokeless tobacco: Current  Vaping Use   Vaping status: Every Day   Substances: Nicotine  Substance and Sexual Activity   Alcohol use: Yes    Comment: 3-4 drinks a year   Drug use: No   Sexual activity: Not Currently    Birth control/protection: None  Other Topics Concern   Not on file  Social History Narrative   46 and 56 y/o sons   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   Social Drivers of Health   Financial Resource Strain: Not on file  Food Insecurity: Food Insecurity Present (07/27/2023)   Hunger Vital Sign    Worried About Running Out of Food in the Last Year: Sometimes true    Ran Out of Food in the Last Year: Sometimes true  Transportation Needs: No Transportation Needs (07/27/2023)   PRAPARE - Administrator, Civil Service (Medical): No    Lack of Transportation (Non-Medical): No   Physical Activity: Not on file  Stress: Not on file  Social Connections: Not on file  Intimate Partner Violence: Not At Risk (07/27/2023)   Humiliation, Afraid, Rape, and Kick questionnaire    Fear of Current or Ex-Partner: No    Emotionally Abused: No    Physically Abused: No    Sexually Abused: No    FAMILY HISTORY: Family History  Problem Relation Age of Onset   Breast cancer Mother 31   Colon cancer Mother 64   Asthma Mother    Hypertension Mother    Hearing loss Mother    Hypertension Father    Diabetes Father    Hyperlipidemia Father    Learning disabilities Son    ADD / ADHD Son    Ovarian cancer  Maternal Grandmother    Hypertension Maternal Grandfather    Hyperlipidemia Maternal Grandfather    Heart disease Maternal Grandfather    Stroke Maternal Grandfather    Mental illness Paternal Grandmother    Hypertension Paternal Grandfather     Review of Systems  Constitutional:  Positive for fatigue. Negative for appetite change, chills, fever and unexpected weight change.  HENT:   Negative for hearing loss, lump/mass and trouble swallowing.   Eyes:  Negative for eye problems and icterus.  Respiratory:  Negative for chest tightness, cough and shortness of breath.   Cardiovascular:  Negative for chest pain, leg swelling and palpitations.  Gastrointestinal:  Negative for abdominal distention, abdominal pain, constipation, diarrhea, nausea and vomiting.  Endocrine: Negative for hot flashes.  Genitourinary:  Negative for difficulty urinating.   Musculoskeletal:  Negative for arthralgias.  Skin:  Negative for itching and rash.  Neurological:  Negative for dizziness, extremity weakness, headaches and numbness.  Hematological:  Negative for adenopathy. Does not bruise/bleed easily.  Psychiatric/Behavioral:  Negative for depression. The patient is not nervous/anxious.       PHYSICAL EXAMINATION    Vitals:   11/21/23 1006  BP: (!) 108/55  Pulse: 69  Resp: 18  SpO2:  99%    Physical Exam Constitutional:      General: She is not in acute distress.    Appearance: Normal appearance. She is not toxic-appearing.  HENT:     Head: Normocephalic and atraumatic.     Mouth/Throat:     Mouth: Mucous membranes are moist.     Pharynx: Oropharynx is clear. No oropharyngeal exudate or posterior oropharyngeal erythema.  Eyes:     General: No scleral icterus. Cardiovascular:     Rate and Rhythm: Normal rate and regular rhythm.     Pulses: Normal pulses.     Heart sounds: Normal heart sounds.  Pulmonary:     Effort: Pulmonary effort is normal.     Breath sounds: Normal breath sounds.  Abdominal:     General: Abdomen is flat. Bowel sounds are normal. There is no distension.     Palpations: Abdomen is soft.     Tenderness: There is no abdominal tenderness.  Musculoskeletal:        General: No swelling.     Cervical back: Neck supple.  Lymphadenopathy:     Cervical: No cervical adenopathy.  Skin:    General: Skin is warm and dry.     Findings: No rash.  Neurological:     General: No focal deficit present.     Mental Status: She is alert.  Psychiatric:        Mood and Affect: Mood normal.        Behavior: Behavior normal.     LABORATORY DATA:  CBC    Component Value Date/Time   WBC 5.6 11/21/2023 0934   WBC 7.2 08/17/2023 1115   RBC 3.22 (L) 11/21/2023 0934   HGB 10.5 (L) 11/21/2023 0934   HCT 31.3 (L) 11/21/2023 0934   PLT 292 11/21/2023 0934   MCV 97.2 11/21/2023 0934   MCH 32.6 11/21/2023 0934   MCHC 33.5 11/21/2023 0934   RDW 14.8 11/21/2023 0934   LYMPHSABS 0.8 11/21/2023 0934   MONOABS 0.4 11/21/2023 0934   EOSABS 0.1 11/21/2023 0934   BASOSABS 0.0 11/21/2023 0934    CMP     Component Value Date/Time   NA 135 11/21/2023 0934   K 3.8 11/21/2023 0934   CL 102 11/21/2023 0934   CO2  29 11/21/2023 0934   GLUCOSE 95 11/21/2023 0934   BUN 9 11/21/2023 0934   CREATININE 0.61 11/21/2023 0934   CREATININE 0.63 08/09/2020 1032    CALCIUM 9.1 11/21/2023 0934   PROT 6.5 11/21/2023 0934   ALBUMIN 4.0 11/21/2023 0934   AST 17 11/21/2023 0934   ALT 17 11/21/2023 0934   ALKPHOS 66 11/21/2023 0934   BILITOT 0.4 11/21/2023 0934   GFRNONAA >60 11/21/2023 0934       ASSESSMENT and THERAPY PLAN:   Malignant neoplasm of upper-inner quadrant of left female breast (HCC) 07/19/2023:Palpable left breast mass for 6 months.  Mammogram and ultrasound revealed large masses.   10:30 position: 4.3 cm with skin thickening and ulceration: Grade 2 IDC with high-grade DCIS ER 95%, PR 95%, Ki67 30%, HER2 2+ by IHC, FISH negative ratio 1.37;  5 o'clock position: 3.3 cm with calcifications spanning 6.7 cm, axilla negative, biopsy: Grade 3 IDC with necrosis ER 95% PR 90% Ki-67 60% HER2 1+ negative   CT CAP 08/03/2023: 2 prominent left breast masses, left axillary lymph nodes, right hepatic lobe lesion concerning for metastatic breast cancer Ultrasound a liver biopsy scheduled for 08/17/2023   Treatment plan: Neoadjuvant chemotherapy with dose Adriamycin and Cytoxan followed by Taxol Mastectomy, left with prophylactic right mastectomy  Adjuvant radiation Antiestrogen therapy with CDK inhibitors -------------------------------------------------------------------------------------------------------------------------------------- Current treatment: Completed 4 cycles of dose dense Adriamycin and Cytoxan, today is cycle 7 Taxol   Liver biopsy  08/17/2023.  Metastatic breast cancer ER 90%, PR 90%, Ki-67 45%, HER2 0 The reason for using definitive chemotherapy is because she has solitary liver metastasis.  Breast Cancer on Chemotherapy Week 7 of treatment. No numbness or tingling in fingertips or toes since dose reduction. Reports fatigue. -Continue current chemotherapy regimen. -Recommended continued energy conservation for fatigue.  Urinary Tract Infection Completed 8-day course of antibiotics. No current symptoms but wants to confirm  resolution. -Order urinalysis to confirm resolution of UTI.  Preoperative Planning Discussed timeline for mastectomy following completion of chemotherapy. -Schedule appointment with surgeon at end of chemotherapy treatment. -Discuss potential need for imaging prior to surgery around cycle 9 of chemotherapy.  General Health Maintenance Reports dry skin and fatigue. -Encourage continued use of lotion and lip balm. -Discussed obtaining repeat echocardiogram following radiation therapy.  RTC weekly for treatment.    All questions were answered. The patient knows to call the clinic with any problems, questions or concerns. We can certainly see the patient much sooner if necessary.  Total encounter time:20 minutes*in face-to-face visit time, chart review, lab review, care coordination, order entry, and documentation of the encounter time.    Lillard Anes, NP 11/23/23 8:20 AM Medical Oncology and Hematology [facility] [address] [place], [place] [address] Tel. [phone]    Fax. [phone]  *Total Encounter Time as defined by the Centers for Medicare and Medicaid Services includes, in addition to the face-to-face time of a patient visit (documented in the note above) non-face-to-face time: obtaining and reviewing outside history, ordering and reviewing medications, tests or procedures, care coordination (communications with other health care professionals or caregivers) and documentation in the medical record.

## 2023-11-21 NOTE — Progress Notes (Signed)
 Pt. Here for port flush/Lab.  Request to have a UA done to make sure her previous UTI had cleared up.  Secure chatted Dr. Louanne Roussel and states ok to do UA/Culture.  Lab informed

## 2023-11-21 NOTE — Patient Instructions (Signed)

## 2023-11-22 LAB — URINE CULTURE: Culture: NO GROWTH

## 2023-11-23 ENCOUNTER — Encounter: Payer: Self-pay | Admitting: Hematology and Oncology

## 2023-11-23 NOTE — Assessment & Plan Note (Signed)
07/19/2023:Palpable left breast mass for 6 months.  Mammogram and ultrasound revealed large masses.   10:30 position: 4.3 cm with skin thickening and ulceration: Grade 2 IDC with high-grade DCIS ER 95%, PR 95%, Ki67 30%, HER2 2+ by IHC, FISH negative ratio 1.37;  5 o'clock position: 3.3 cm with calcifications spanning 6.7 cm, axilla negative, biopsy: Grade 3 IDC with necrosis ER 95% PR 90% Ki-67 60% HER2 1+ negative   CT CAP 08/03/2023: 2 prominent left breast masses, left axillary lymph nodes, right hepatic lobe lesion concerning for metastatic breast cancer Ultrasound a liver biopsy scheduled for 08/17/2023   Treatment plan: Neoadjuvant chemotherapy with dose Adriamycin and Cytoxan followed by Taxol Mastectomy, left with prophylactic right mastectomy  Adjuvant radiation Antiestrogen therapy with CDK inhibitors -------------------------------------------------------------------------------------------------------------------------------------- Current treatment: Completed 4 cycles of dose dense Adriamycin and Cytoxan, today is cycle 7 Taxol   Liver biopsy  08/17/2023.  Metastatic breast cancer ER 90%, PR 90%, Ki-67 45%, HER2 0 The reason for using definitive chemotherapy is because she has solitary liver metastasis.  Breast Cancer on Chemotherapy Week 7 of treatment. No numbness or tingling in fingertips or toes since dose reduction. Reports fatigue. -Continue current chemotherapy regimen. -Recommended continued energy conservation for fatigue.  Urinary Tract Infection Completed 8-day course of antibiotics. No current symptoms but wants to confirm resolution. -Order urinalysis to confirm resolution of UTI.  Preoperative Planning Discussed timeline for mastectomy following completion of chemotherapy. -Schedule appointment with surgeon at end of chemotherapy treatment. -Discuss potential need for imaging prior to surgery around cycle 9 of chemotherapy.  General Health  Maintenance Reports dry skin and fatigue. -Encourage continued use of lotion and lip balm. -Discussed obtaining repeat echocardiogram following radiation therapy.

## 2023-11-27 ENCOUNTER — Encounter: Payer: Self-pay | Admitting: *Deleted

## 2023-11-28 ENCOUNTER — Other Ambulatory Visit: Payer: Self-pay | Admitting: Hematology and Oncology

## 2023-11-28 ENCOUNTER — Inpatient Hospital Stay: Payer: Medicaid Other

## 2023-11-28 ENCOUNTER — Encounter: Payer: Self-pay | Admitting: Hematology and Oncology

## 2023-11-28 VITALS — BP 110/89 | HR 80 | Temp 97.7°F | Resp 18 | Wt 143.8 lb

## 2023-11-28 DIAGNOSIS — C787 Secondary malignant neoplasm of liver and intrahepatic bile duct: Secondary | ICD-10-CM | POA: Diagnosis not present

## 2023-11-28 DIAGNOSIS — F17290 Nicotine dependence, other tobacco product, uncomplicated: Secondary | ICD-10-CM | POA: Diagnosis not present

## 2023-11-28 DIAGNOSIS — Z170 Estrogen receptor positive status [ER+]: Secondary | ICD-10-CM | POA: Diagnosis not present

## 2023-11-28 DIAGNOSIS — Z5111 Encounter for antineoplastic chemotherapy: Secondary | ICD-10-CM | POA: Diagnosis not present

## 2023-11-28 DIAGNOSIS — C50212 Malignant neoplasm of upper-inner quadrant of left female breast: Secondary | ICD-10-CM

## 2023-11-28 DIAGNOSIS — Z9189 Other specified personal risk factors, not elsewhere classified: Secondary | ICD-10-CM

## 2023-11-28 LAB — CMP (CANCER CENTER ONLY)
ALT: 16 U/L (ref 0–44)
AST: 18 U/L (ref 15–41)
Albumin: 4.1 g/dL (ref 3.5–5.0)
Alkaline Phosphatase: 70 U/L (ref 38–126)
Anion gap: 6 (ref 5–15)
BUN: 7 mg/dL (ref 6–20)
CO2: 26 mmol/L (ref 22–32)
Calcium: 9.3 mg/dL (ref 8.9–10.3)
Chloride: 104 mmol/L (ref 98–111)
Creatinine: 0.66 mg/dL (ref 0.44–1.00)
GFR, Estimated: >60 mL/min (ref >60)
Glucose, Bld: 121 mg/dL — ABNORMAL HIGH (ref 70–99)
Potassium: 3.8 mmol/L (ref 3.5–5.1)
Sodium: 136 mmol/L (ref 135–145)
Total Bilirubin: 0.4 mg/dL (ref 0.0–1.2)
Total Protein: 6.8 g/dL (ref 6.5–8.1)

## 2023-11-28 LAB — CBC WITH DIFFERENTIAL (CANCER CENTER ONLY)
Abs Immature Granulocytes: 0.03 10*3/uL (ref 0.00–0.07)
Basophils Absolute: 0 10*3/uL (ref 0.0–0.1)
Basophils Relative: 1 %
Eosinophils Absolute: 0 10*3/uL (ref 0.0–0.5)
Eosinophils Relative: 0 %
HCT: 33 % — ABNORMAL LOW (ref 36.0–46.0)
Hemoglobin: 11 g/dL — ABNORMAL LOW (ref 12.0–15.0)
Immature Granulocytes: 1 %
Lymphocytes Relative: 20 %
Lymphs Abs: 0.9 10*3/uL (ref 0.7–4.0)
MCH: 32.4 pg (ref 26.0–34.0)
MCHC: 33.3 g/dL (ref 30.0–36.0)
MCV: 97.3 fL (ref 80.0–100.0)
Monocytes Absolute: 0.3 10*3/uL (ref 0.1–1.0)
Monocytes Relative: 7 %
Neutro Abs: 3.1 10*3/uL (ref 1.7–7.7)
Neutrophils Relative %: 71 %
Platelet Count: 317 10*3/uL (ref 150–400)
RBC: 3.39 MIL/uL — ABNORMAL LOW (ref 3.87–5.11)
RDW: 14.1 % (ref 11.5–15.5)
WBC Count: 4.3 10*3/uL (ref 4.0–10.5)
nRBC: 0 % (ref 0.0–0.2)

## 2023-11-28 LAB — PREGNANCY, URINE: Preg Test, Ur: NEGATIVE

## 2023-11-28 MED ORDER — FAMOTIDINE IN NACL 20-0.9 MG/50ML-% IV SOLN
20.0000 mg | Freq: Once | INTRAVENOUS | Status: AC
  Administered 2023-11-28: 20 mg via INTRAVENOUS
  Filled 2023-11-28: qty 50

## 2023-11-28 MED ORDER — SODIUM CHLORIDE 0.9 % IV SOLN: Freq: Once | INTRAVENOUS | Status: AC

## 2023-11-28 MED ORDER — DEXAMETHASONE SODIUM PHOSPHATE 10 MG/ML IJ SOLN
10.0000 mg | Freq: Once | INTRAMUSCULAR | Status: AC
  Administered 2023-11-28: 10 mg via INTRAVENOUS
  Filled 2023-11-28: qty 1

## 2023-11-28 MED ORDER — HEPARIN SOD (PORK) LOCK FLUSH 100 UNIT/ML IV SOLN
500.0000 [IU] | Freq: Once | INTRAVENOUS | Status: AC | PRN
  Administered 2023-11-28: 500 [IU]

## 2023-11-28 MED ORDER — DIPHENHYDRAMINE HCL 50 MG/ML IJ SOLN
25.0000 mg | Freq: Once | INTRAMUSCULAR | Status: AC
  Administered 2023-11-28: 25 mg via INTRAVENOUS
  Filled 2023-11-28: qty 1

## 2023-11-28 MED ORDER — SODIUM CHLORIDE 0.9 % IV SOLN
50.0000 mg/m2 | Freq: Once | INTRAVENOUS | Status: AC
  Administered 2023-11-28: 90 mg via INTRAVENOUS
  Filled 2023-11-28: qty 15

## 2023-11-28 MED ORDER — SODIUM CHLORIDE 0.9% FLUSH
10.0000 mL | INTRAVENOUS | Status: DC | PRN
  Administered 2023-11-28: 10 mL

## 2023-11-28 NOTE — Progress Notes (Signed)
Patient reports some neuropathy in both feet in the middle digits. Spoke with Dr. Pamelia Hoit he is dose reducing her treatment today. Ok to treat.

## 2023-11-28 NOTE — Research (Signed)
[id number], ICE COMPRESS: RANDOMIZED TRIAL OF LIMB CRYOCOMPRESSION VERSUS CONTINUOUS COMPRESSION VERSUS LOW CYCLIC COMPRESSION FOR THE PREVENTION  OF TAXANE-INDUCED PERIPHERAL NEUROPATHY   Patient arrives today Unaccompanied for the cycle 8 visit. Confirmed patient does not have wounds, sores, or lesions to extremities. Patient has not had any vaccinations since last visit.   ADVERSE EVENTS: Patient reports no adverse events from study intervention (Paxman machine). Pt denies any motor neuropathy symptoms, but reports sensory neuropathy symptoms. She reports full uninterrupted function of all her limbs, as well as hands and feet. Pt did report mild numbness and tingling in her middle toes on both feet (right worse than left). She reports this started yesterday and has not resolved. Research RN was notified by infusion RN of this report and this finding was discussed with Dr. Pamelia Hoit. As a result of this conversation her dose of Paclitaxel was reduced. Dr. Pamelia Hoit did not feel the sensory neuropathy was related to the paxman machine (study intervention) but probably related to the Taxol. Pt reports this numbness and tingling does not interfere with her everyday life it just "feels funny". She reports she also has a history of a pinched nerve and wondered the mild sensory neuropathy was related to that. RN assessed pt hands and feet bilaterally, and they had normal color, temperature, and sensation.    SOLICITED ADVERSE EVENTS:    Adverse Event CTCAE Grade Relationship to Study Intervention Onset Resolved Action Taken Relationship to chemotherapy Comments  Skin atrophy 0 NA         Skin hyperpigmentation 0 NA         Skin hypopigmentation 0 NA         Skin induration 0 NA         Skin ulceration 0 NA         Rash maculopapular 0 NA         Nail changes 0 NA         Cold intolerance (general disorders and administration site conditions- other) 0 NA         Frostbite (skin and subcutaneous tissue disorders-  other) 0 NA         Fatigue 1 Not related to paxman  machine   none  Pt reports mild fatigue today. Stating she is still able to perform everyday task but does need intermittent rest periods.   Peripheral Sensory Neuropathy  1 Not related to [employer].  11-27-23 per pt report  Pt dose of Paclitaxel reduced Probably related to Taxol per Dr. Pamelia Hoit.  Pt reports this mild numbness and tingling (middle toes of both feet) does not interfere with her daily function of ADL's and just "feels funny".       STUDY INTERVENTION & TOLERABILITY ASSESSMENTS: 1:02 pm- Wraps applied; pre-treatment started. Starting temperature 11 degrees Celsius. Starting pressure 5-15 mmHG. 1:10 pm- Required 5-15 minute Tolerability check- pt states tolerable to cooling and compression 1:42 pm- Taxane infusion start time. Moved to treatment phase. 2:44 pm- Taxane infusion end time. Moved to post-treatment phase. Bathroom break time covered  during this time.  3:15 pm- Study intervention end time. Confirmed no new wounds, sores, or lesions.   DISPOSITION: Upon completion of all study requirements, patient remained in infusion chair with infusion RN Lyla Son at pt bedside. Pt understands that research will meet her with cycle 9 that is scheduled for 12-05-23.    The patient was thanked for their time and continued voluntary participation in this study. [name]  has been provided direct contact information and is encouraged to contact this Nurse for any needs or questions.

## 2023-11-28 NOTE — Patient Instructions (Signed)
 [facility] - A DEPT OF [facility]  Discharge Instructions: Thank you for choosing [facility] to provide your oncology and hematology care.   If you have a lab appointment with the Cancer Center, please go directly to the Cancer Center and check in at the registration area.   Wear comfortable clothing and clothing appropriate for easy access to any Portacath or PICC line.   We strive to give you quality time with your provider. You may need to reschedule your appointment if you arrive late (15 or more minutes).  Arriving late affects you and other patients whose appointments are after yours.  Also, if you miss three or more appointments without notifying the office, you may be dismissed from the clinic at the provider's discretion.      For prescription refill requests, have your pharmacy contact our office and allow 72 hours for refills to be completed.    Today you received the following chemotherapy and/or immunotherapy agents: Taxol      To help prevent nausea and vomiting after your treatment, we encourage you to take your nausea medication as directed.  BELOW ARE SYMPTOMS THAT SHOULD BE REPORTED IMMEDIATELY: *FEVER GREATER THAN 100.4 F (38 C) OR HIGHER *CHILLS OR SWEATING *NAUSEA AND VOMITING THAT IS NOT CONTROLLED WITH YOUR NAUSEA MEDICATION *UNUSUAL SHORTNESS OF BREATH *UNUSUAL BRUISING OR BLEEDING *URINARY PROBLEMS (pain or burning when urinating, or frequent urination) *BOWEL PROBLEMS (unusual diarrhea, constipation, pain near the anus) TENDERNESS IN MOUTH AND THROAT WITH OR WITHOUT PRESENCE OF ULCERS (sore throat, sores in mouth, or a toothache) UNUSUAL RASH, SWELLING OR PAIN  UNUSUAL VAGINAL DISCHARGE OR ITCHING   Items with * indicate a potential emergency and should be followed up as soon as possible or go to the Emergency Department if any problems should occur.  Please show the CHEMOTHERAPY ALERT CARD or IMMUNOTHERAPY  ALERT CARD at check-in to the Emergency Department and triage nurse.  Should you have questions after your visit or need to cancel or reschedule your appointment, please contact [facility] - A DEPT OF [facility]  Dept: [phone]  and follow the prompts.  Office hours are 8:00 a.m. to 4:30 p.m. Monday - Friday. Please note that voicemails left after 4:00 p.m. may not be returned until the following business day.  We are closed weekends and major holidays. You have access to a nurse at all times for urgent questions. Please call the main number to the clinic Dept: [phone] and follow the prompts.   For any non-urgent questions, you may also contact your provider using MyChart. We now offer e-Visits for anyone 48 and older to request care online for non-urgent symptoms. For details visit [email or web address].   Also download the MyChart app! Go to the app store, search "MyChart", open the app, select [facility], and log in with your MyChart username and password.

## 2023-11-29 ENCOUNTER — Other Ambulatory Visit: Payer: Self-pay

## 2023-12-05 ENCOUNTER — Telehealth: Payer: Self-pay

## 2023-12-05 ENCOUNTER — Inpatient Hospital Stay: Payer: Medicaid Other

## 2023-12-05 ENCOUNTER — Inpatient Hospital Stay: Payer: Medicaid Other | Admitting: Adult Health

## 2023-12-05 NOTE — Telephone Encounter (Signed)
Called pt about her missed appts pt states she called and Lvm stating she was having real bad stomach pain and that she need someone to call her back so she can reschedule. Message was sent to scheduling.

## 2023-12-05 NOTE — Telephone Encounter (Signed)
Called patient to rescedeule appt missed today Patient will come for treatment on 1/31.Marland Kitchen

## 2023-12-07 ENCOUNTER — Encounter: Payer: Self-pay | Admitting: *Deleted

## 2023-12-07 ENCOUNTER — Inpatient Hospital Stay: Payer: Medicaid Other

## 2023-12-07 VITALS — BP 111/82 | HR 80 | Temp 98.0°F | Resp 17 | Wt 144.2 lb

## 2023-12-07 DIAGNOSIS — C50212 Malignant neoplasm of upper-inner quadrant of left female breast: Secondary | ICD-10-CM

## 2023-12-07 DIAGNOSIS — C787 Secondary malignant neoplasm of liver and intrahepatic bile duct: Secondary | ICD-10-CM | POA: Diagnosis not present

## 2023-12-07 DIAGNOSIS — Z95828 Presence of other vascular implants and grafts: Secondary | ICD-10-CM

## 2023-12-07 DIAGNOSIS — Z5111 Encounter for antineoplastic chemotherapy: Secondary | ICD-10-CM | POA: Diagnosis not present

## 2023-12-07 DIAGNOSIS — F17290 Nicotine dependence, other tobacco product, uncomplicated: Secondary | ICD-10-CM | POA: Diagnosis not present

## 2023-12-07 DIAGNOSIS — Z170 Estrogen receptor positive status [ER+]: Secondary | ICD-10-CM | POA: Diagnosis not present

## 2023-12-07 LAB — CBC WITH DIFFERENTIAL (CANCER CENTER ONLY)
Abs Immature Granulocytes: 0.02 10*3/uL (ref 0.00–0.07)
Basophils Absolute: 0 10*3/uL (ref 0.0–0.1)
Basophils Relative: 1 %
Eosinophils Absolute: 0.1 10*3/uL (ref 0.0–0.5)
Eosinophils Relative: 2 %
HCT: 35.4 % — ABNORMAL LOW (ref 36.0–46.0)
Hemoglobin: 12 g/dL (ref 12.0–15.0)
Immature Granulocytes: 1 %
Lymphocytes Relative: 21 %
Lymphs Abs: 0.9 10*3/uL (ref 0.7–4.0)
MCH: 32.5 pg (ref 26.0–34.0)
MCHC: 33.9 g/dL (ref 30.0–36.0)
MCV: 95.9 fL (ref 80.0–100.0)
Monocytes Absolute: 0.4 10*3/uL (ref 0.1–1.0)
Monocytes Relative: 11 %
Neutro Abs: 2.7 10*3/uL (ref 1.7–7.7)
Neutrophils Relative %: 64 %
Platelet Count: 322 10*3/uL (ref 150–400)
RBC: 3.69 MIL/uL — ABNORMAL LOW (ref 3.87–5.11)
RDW: 12.8 % (ref 11.5–15.5)
WBC Count: 4 10*3/uL (ref 4.0–10.5)
nRBC: 0 % (ref 0.0–0.2)

## 2023-12-07 LAB — CMP (CANCER CENTER ONLY)
ALT: 30 U/L (ref 0–44)
AST: 29 U/L (ref 15–41)
Albumin: 4.3 g/dL (ref 3.5–5.0)
Alkaline Phosphatase: 73 U/L (ref 38–126)
Anion gap: 8 (ref 5–15)
BUN: 11 mg/dL (ref 6–20)
CO2: 26 mmol/L (ref 22–32)
Calcium: 9.3 mg/dL (ref 8.9–10.3)
Chloride: 101 mmol/L (ref 98–111)
Creatinine: 0.66 mg/dL (ref 0.44–1.00)
GFR, Estimated: >60 mL/min (ref >60)
Glucose, Bld: 107 mg/dL — ABNORMAL HIGH (ref 70–99)
Potassium: 3.5 mmol/L (ref 3.5–5.1)
Sodium: 135 mmol/L (ref 135–145)
Total Bilirubin: 0.5 mg/dL (ref 0.0–1.2)
Total Protein: 6.9 g/dL (ref 6.5–8.1)

## 2023-12-07 LAB — PREGNANCY, URINE: Preg Test, Ur: NEGATIVE

## 2023-12-07 MED ORDER — DEXAMETHASONE SODIUM PHOSPHATE 10 MG/ML IJ SOLN
10.0000 mg | Freq: Once | INTRAMUSCULAR | Status: AC
Start: 2023-12-07 — End: 2023-12-07
  Administered 2023-12-07: 10 mg via INTRAVENOUS
  Filled 2023-12-07: qty 1

## 2023-12-07 MED ORDER — SODIUM CHLORIDE 0.9% FLUSH
10.0000 mL | Freq: Once | INTRAVENOUS | Status: AC
  Administered 2023-12-07: 10 mL

## 2023-12-07 MED ORDER — SODIUM CHLORIDE 0.9 % IV SOLN: Freq: Once | INTRAVENOUS | Status: AC

## 2023-12-07 MED ORDER — FAMOTIDINE IN NACL 20-0.9 MG/50ML-% IV SOLN
20.0000 mg | Freq: Once | INTRAVENOUS | Status: AC
  Administered 2023-12-07: 20 mg via INTRAVENOUS
  Filled 2023-12-07: qty 50

## 2023-12-07 MED ORDER — SODIUM CHLORIDE 0.9 % IV SOLN
50.0000 mg/m2 | Freq: Once | INTRAVENOUS | Status: AC
  Administered 2023-12-07: 90 mg via INTRAVENOUS
  Filled 2023-12-07: qty 15

## 2023-12-07 MED ORDER — DIPHENHYDRAMINE HCL 50 MG/ML IJ SOLN
25.0000 mg | Freq: Once | INTRAMUSCULAR | Status: AC
  Administered 2023-12-07: 25 mg via INTRAVENOUS
  Filled 2023-12-07: qty 1

## 2023-12-07 NOTE — Research (Unsigned)
Z6109, ICE COMPRESS: RANDOMIZED TRIAL OF LIMB CRYOCOMPRESSION VERSUS CONTINUOUS COMPRESSION VERSUS LOW CYCLIC COMPRESSION FOR THE PREVENTION  OF TAXANE-INDUCED PERIPHERAL NEUROPATHY   Patient arrives today Unaccompanied for the cycle 9 visit. Confirmed patient does not have wounds, sores, or lesions to extremities. Patient has not had any vaccinations since last visit.   ADVERSE EVENTS: Patient reports no adverse events from study intervention (Paxman machine). Pt denies any motor neuropathy symptoms, but reports ongoing sensory neuropathy symptoms in her toes unchanged from last week. Patient reports it is not any worse or better.  Dr. Pamelia Hoit previously stated the sensory neuropathy was not related to the paxman machine (study intervention) but is probably related to the Taxol. RN assessed pt hands and feet bilaterally, and they had normal color, temperature, and sensation.  Patient also reports ongoing fatigue unchanged. She also reports recovered from what she thinks was a "GI bug" with nausea, vomiting and diarrhea earlier this week. Her treatment today was delayed from 12/05/23 to give her a few days to recover from this illness.    SOLICITED ADVERSE EVENTS:    Adverse Event CTCAE Grade Relationship to Study Intervention Onset Resolved Action Taken Relationship to chemotherapy Comments  Skin atrophy 0 NA         Skin hyperpigmentation 0 NA         Skin hypopigmentation 0 NA         Skin induration 0 NA         Skin ulceration 0 NA         Rash maculopapular 0 NA         Nail changes 0 NA         Cold intolerance (general disorders and administration site conditions- other) 0 NA         Frostbite (skin and subcutaneous tissue disorders- other) 0 NA         Fatigue 1 Not related to paxman  machine   none  Pt reports mild fatigue today. Stating she is still able to perform everyday task but does need intermittent rest periods.   Peripheral Sensory Neuropathy  1 Not related to  [employer].  11-27-23 per pt report  Paclitaxel dose reduced on 11/28/23. Probably related to Taxol per Dr. Pamelia Hoit.  Pt reports this mild numbness and tingling (middle toes of both feet) does not interfere with her daily function of ADL's and just "feels funny".       STUDY INTERVENTION & TOLERABILITY ASSESSMENTS: 12:05 pm- Wraps applied; pre-treatment started. Starting temperature 11 degrees Celsius. Starting pressure 5-15 mmHG. 12:10 pm- Required 5-15 minute Tolerability check- pt states tolerable to cooling and compression 12:39 pm- Taxane infusion start time. Moved to treatment phase. 1:50 pm- Taxane infusion end time. Moved to post-treatment phase.  2:25 pm- Study intervention end time. Confirmed no new wounds, sores, or lesions or skin changes after wraps removed.   DISPOSITION: Upon completion of all study requirements, patient remained in infusion chair with infusion RN at pt chair side. Pt understands that research will meet her with cycle 10 that is scheduled for 12-12-23.    The patient was thanked for their time and continued voluntary participation in this study. [name] has been provided direct contact information and is encouraged to contact this Nurse for any needs or questions.

## 2023-12-08 DIAGNOSIS — Z419 Encounter for procedure for purposes other than remedying health state, unspecified: Secondary | ICD-10-CM | POA: Diagnosis not present

## 2023-12-10 ENCOUNTER — Encounter: Payer: Self-pay | Admitting: Hematology and Oncology

## 2023-12-12 ENCOUNTER — Inpatient Hospital Stay: Payer: Medicaid Other | Attending: Hematology and Oncology

## 2023-12-12 ENCOUNTER — Inpatient Hospital Stay: Payer: Medicaid Other | Attending: Hematology and Oncology | Admitting: Hematology and Oncology

## 2023-12-12 ENCOUNTER — Inpatient Hospital Stay: Payer: Medicaid Other

## 2023-12-12 VITALS — BP 124/69 | HR 92 | Temp 98.7°F | Resp 18 | Ht 66.0 in | Wt 142.4 lb

## 2023-12-12 DIAGNOSIS — Z170 Estrogen receptor positive status [ER+]: Secondary | ICD-10-CM | POA: Diagnosis not present

## 2023-12-12 DIAGNOSIS — C787 Secondary malignant neoplasm of liver and intrahepatic bile duct: Secondary | ICD-10-CM | POA: Diagnosis not present

## 2023-12-12 DIAGNOSIS — D6481 Anemia due to antineoplastic chemotherapy: Secondary | ICD-10-CM | POA: Diagnosis not present

## 2023-12-12 DIAGNOSIS — Z95828 Presence of other vascular implants and grafts: Secondary | ICD-10-CM

## 2023-12-12 DIAGNOSIS — C50212 Malignant neoplasm of upper-inner quadrant of left female breast: Secondary | ICD-10-CM

## 2023-12-12 DIAGNOSIS — Z5111 Encounter for antineoplastic chemotherapy: Secondary | ICD-10-CM | POA: Insufficient documentation

## 2023-12-12 LAB — CMP (CANCER CENTER ONLY)
ALT: 13 U/L (ref 0–44)
AST: 13 U/L — ABNORMAL LOW (ref 15–41)
Albumin: 4.1 g/dL (ref 3.5–5.0)
Alkaline Phosphatase: 68 U/L (ref 38–126)
Anion gap: 5 (ref 5–15)
BUN: 13 mg/dL (ref 6–20)
CO2: 27 mmol/L (ref 22–32)
Calcium: 9.3 mg/dL (ref 8.9–10.3)
Chloride: 104 mmol/L (ref 98–111)
Creatinine: 0.55 mg/dL (ref 0.44–1.00)
GFR, Estimated: >60 mL/min (ref >60)
Glucose, Bld: 99 mg/dL (ref 70–99)
Potassium: 3.5 mmol/L (ref 3.5–5.1)
Sodium: 136 mmol/L (ref 135–145)
Total Bilirubin: 0.3 mg/dL (ref 0.0–1.2)
Total Protein: 6.5 g/dL (ref 6.5–8.1)

## 2023-12-12 LAB — CBC WITH DIFFERENTIAL (CANCER CENTER ONLY)
Abs Immature Granulocytes: 0.01 10*3/uL (ref 0.00–0.07)
Basophils Absolute: 0 10*3/uL (ref 0.0–0.1)
Basophils Relative: 1 %
Eosinophils Absolute: 0.1 10*3/uL (ref 0.0–0.5)
Eosinophils Relative: 2 %
HCT: 33.4 % — ABNORMAL LOW (ref 36.0–46.0)
Hemoglobin: 11.2 g/dL — ABNORMAL LOW (ref 12.0–15.0)
Immature Granulocytes: 0 %
Lymphocytes Relative: 15 %
Lymphs Abs: 0.9 10*3/uL (ref 0.7–4.0)
MCH: 32.5 pg (ref 26.0–34.0)
MCHC: 33.5 g/dL (ref 30.0–36.0)
MCV: 96.8 fL (ref 80.0–100.0)
Monocytes Absolute: 0.4 10*3/uL (ref 0.1–1.0)
Monocytes Relative: 6 %
Neutro Abs: 4.3 10*3/uL (ref 1.7–7.7)
Neutrophils Relative %: 76 %
Platelet Count: 281 10*3/uL (ref 150–400)
RBC: 3.45 MIL/uL — ABNORMAL LOW (ref 3.87–5.11)
RDW: 12.4 % (ref 11.5–15.5)
WBC Count: 5.6 10*3/uL (ref 4.0–10.5)
nRBC: 0 % (ref 0.0–0.2)

## 2023-12-12 MED ORDER — FAMOTIDINE IN NACL 20-0.9 MG/50ML-% IV SOLN
20.0000 mg | Freq: Once | INTRAVENOUS | Status: AC
  Administered 2023-12-12: 20 mg via INTRAVENOUS
  Filled 2023-12-12: qty 50

## 2023-12-12 MED ORDER — DIPHENHYDRAMINE HCL 50 MG/ML IJ SOLN
25.0000 mg | Freq: Once | INTRAMUSCULAR | Status: AC
  Administered 2023-12-12: 25 mg via INTRAVENOUS
  Filled 2023-12-12: qty 1

## 2023-12-12 MED ORDER — SODIUM CHLORIDE 0.9% FLUSH
10.0000 mL | Freq: Once | INTRAVENOUS | Status: AC
  Administered 2023-12-12: 10 mL

## 2023-12-12 MED ORDER — DEXAMETHASONE SODIUM PHOSPHATE 10 MG/ML IJ SOLN
10.0000 mg | Freq: Once | INTRAMUSCULAR | Status: AC
Start: 2023-12-12 — End: 2023-12-12
  Administered 2023-12-12: 10 mg via INTRAVENOUS
  Filled 2023-12-12: qty 1

## 2023-12-12 MED ORDER — SODIUM CHLORIDE 0.9 % IV SOLN
50.0000 mg/m2 | Freq: Once | INTRAVENOUS | Status: AC
  Administered 2023-12-12: 90 mg via INTRAVENOUS
  Filled 2023-12-12: qty 15

## 2023-12-12 MED ORDER — HEPARIN SOD (PORK) LOCK FLUSH 100 UNIT/ML IV SOLN
500.0000 [IU] | Freq: Once | INTRAVENOUS | Status: AC | PRN
  Administered 2023-12-12: 500 [IU]

## 2023-12-12 MED ORDER — SODIUM CHLORIDE 0.9% FLUSH
10.0000 mL | INTRAVENOUS | Status: DC | PRN
  Administered 2023-12-12: 10 mL

## 2023-12-12 MED ORDER — SODIUM CHLORIDE 0.9 % IV SOLN: Freq: Once | INTRAVENOUS | Status: AC

## 2023-12-12 NOTE — Assessment & Plan Note (Signed)
 07/19/2023:Palpable left breast mass for 6 months.  Mammogram and ultrasound revealed large masses.   10:30 position: 4.3 cm with skin thickening and ulceration: Grade 2 IDC with high-grade DCIS ER 95%, PR 95%, Ki67 30%, HER2 2+ by IHC, FISH negative ratio 1.37;  5 o'clock position: 3.3 cm with calcifications spanning 6.7 cm, axilla negative, biopsy: Grade 3 IDC with necrosis ER 95% PR 90% Ki-67 60% HER2 1+ negative   CT CAP 08/03/2023: 2 prominent left breast masses, left axillary lymph nodes, right hepatic lobe lesion concerning for metastatic breast cancer Ultrasound a liver biopsy scheduled for 08/17/2023   Treatment plan: Neoadjuvant chemotherapy with dose Adriamycin  and Cytoxan  followed by Taxol  Mastectomy left breast Adjuvant radiation Antiestrogen therapy with CDK inhibitors -------------------------------------------------------------------------------------------------------------------------------------- Current treatment: Completed 4 cycles of dose dense Adriamycin  and Cytoxan , today is cycle 10 Taxol    Liver biopsy  08/17/2023.  Metastatic breast cancer ER 90%, PR 90%, Ki-67 45%, HER2 0 The reason for using definitive chemotherapy is because she has solitary liver metastasis.   Chemo toxicities: Tongue changes Fatigue Chemo induced anemia: Today's hemoglobin is better.  She will undergo breast MRI and consider liver directed therapy.

## 2023-12-12 NOTE — Research (Signed)
 D7794, ICE COMPRESS: RANDOMIZED TRIAL OF LIMB CRYOCOMPRESSION VERSUS CONTINUOUS COMPRESSION VERSUS LOW CYCLIC COMPRESSION FOR THE PREVENTION  OF TAXANE-INDUCED PERIPHERAL NEUROPATHY   Patient arrives today Unaccompanied for the cycle 10 visit. Confirmed patient does not have wounds, sores, or lesions to extremities. Patient has not had any vaccinations since last visit.   ADVERSE EVENTS: Patient reports no adverse events from study intervention (Paxman machine). Pt denies any motor neuropathy symptoms, but reports ongoing sensory neuropathy symptoms in her toes unchanged from last week. Patient reports it is not any worse or better. Patient reports it feels like a sock in stuck on the three middle toes bilaterally on both feet. Pt also reports that this sensory neuropathy does not interfere with her everyday life function.  Dr. Odean previously stated the sensory neuropathy was not related to the paxman machine (study intervention) but is probably related to the Taxol . RN assessed pt hands and feet bilaterally, and they had normal color, temperature, and sensation. Patient also reports ongoing fatigue unchanged.  Pt reports feeling better from last week overall after having GI Bug.    Md Visit: Pt will see Dr. Gudena today. Research RN encouraged pt to discuss ongoing grade 1 peripheral sensory neuropathy with Dr. Gudena today.   SOLICITED ADVERSE EVENTS:    Adverse Event CTCAE Grade Relationship to Study Intervention Onset Resolved Action Taken Relationship to chemotherapy Comments  Skin atrophy 0 NA            Skin hyperpigmentation 0 NA            Skin hypopigmentation 0 NA            Skin induration 0 NA            Skin ulceration 0 NA            Rash maculopapular 0 NA            Nail changes 0 NA            Cold intolerance (general disorders and administration site conditions- other) 0 NA            Frostbite (skin and subcutaneous tissue disorders- other) 0 NA            Fatigue 1  Not related to paxman  machine     none   Pt reports mild fatigue today. Stating she is still able to perform everyday task but does need intermittent rest periods.   Peripheral Sensory Neuropathy   1 Not related to [employer].  11-27-23 per pt report   Paclitaxel  dose reduced on 11/28/23. Probably related to Taxol  per Dr. Odean.  Pt reports this mild numbness and tingling (middle toes of both feet) does not interfere with her daily function of ADL's and just feels funny.       STUDY INTERVENTION & TOLERABILITY ASSESSMENTS: 2:20 pm- Wraps applied; pre-treatment started. Starting temperature 11 degrees Celsius. Starting pressure 5-15 mmHG. 2:27 pm- Required 5-15 minute Tolerability check- pt states tolerable to cooling and compression 3:05 pm- Taxane infusion start time. Moved to treatment phase. 3:15 pm- Pt reported her left leg wrap felt warmer than the right. RN checked machine and temperature showed 32 degrees Celsius. RN called for back up machines to infusion room and second research RN Andrea came to assist.  3:20 pm-Machine switched out by research RN staff  3:25 pm-Report given to [employer] RN who assumed care of pt and will continue documentation for study intervention.  Delon Pinal BSN RN Clinical Research Nurse [facility] Direct Dial: [phone] 12/12/2023  3:48 PM

## 2023-12-12 NOTE — Research (Signed)
 D7794, ICE COMPRESS: RANDOMIZED TRIAL OF LIMB CRYOCOMPRESSION VERSUS CONTINUOUS COMPRESSION VERSUS LOW CYCLIC COMPRESSION FOR THE PREVENTION  OF TAXANE-INDUCED PERIPHERAL NEUROPATHY  Rec'd patient from Research RN Delon Pinal. Patient is on cryocompression arm; both machines are within study parameters. Patient is resting with eyes closed. Updated Infusion RN Vertell that I am taking over for [facility] and gave her my direct number.   1603 Patient got up for bathroom break. 1614 Resumed treatment after bathroom break. Taxane finished during the break. Both machines within study parameters for cryocompression arm. Patient will need to make up 11 minutes. RN Vertell has contact information for research in case we are needed before then. 1651 Disconnected patient; treatment complete, including the 11 minutes of make-up. No wounds, sores, or lesions. Patient walked to lobby on her own. Confirmed that she will reach out to the research team prn.  Andrea MORTON Lucus Lambertson, RN, BSN, [facility]  [facility] Clinical Research Nurse [facility] Direct Dial [phone] 12/12/2023 3:33 PM

## 2023-12-12 NOTE — Progress Notes (Signed)
 Patient Care Team: Job Lukes, [name] as PCP - General (Physician Assistant) Obgyn, Anna as Consulting Physician (Obstetrics and Gynecology) Onetha Kuba, MD as Consulting Physician (Neurosurgery) Tyree Nanetta SAILOR, RN as Oncology Nurse Navigator Glean Stephane BROCKS, RN as Oncology Nurse Navigator [name], MD as Consulting Physician (Hematology and Oncology) Aron Shoulders, MD as Consulting Physician (General Surgery) Izell Domino, MD as Attending Physician (Radiation Oncology)  DIAGNOSIS:  Encounter Diagnoses  Name Primary?   Malignant neoplasm of upper-inner quadrant of left female breast, unspecified estrogen receptor status (HCC) Yes   Malignant neoplasm of upper-inner quadrant of left breast in female, estrogen receptor positive (HCC)     SUMMARY OF ONCOLOGIC HISTORY: Oncology History  Malignant neoplasm of upper-inner quadrant of left female breast (HCC)  07/19/2023 Initial Diagnosis   Palpable left breast mass for 6 months.  Mammogram and ultrasound revealed large masses.  10:30 position: 4.3 cm with skin thickening and ulceration: Grade 2 IDC with high-grade DCIS ER 95%, PR 95%, Ki67 30%, HER2 2+ by IHC, FISH negative ratio 1.37; 5 o'clock position: 3.3 cm with calcifications spanning 6.7 cm, axilla negative, biopsy: Grade 3 IDC with necrosis ER 95% PR 90% Ki-67 60% HER2 1+ negative   07/26/2023 Cancer Staging   Staging form: Breast, AJCC 8th Edition - Clinical: Stage IIIB (cT4b, cN0, cM0, G3, ER+, PR+, HER2-) - Signed by [name], MD on 07/26/2023 Histologic grading system: 3 grade system   08/15/2023 -  Chemotherapy   Patient is on Treatment Plan : BREAST ADJUVANT DOSE DENSE AC q14d / PACLitaxel  q7d     08/17/2023 Initial Biopsy   Liver biopsy: consistent with metastatic carcinoma, breast primary.     08/21/2023 Cancer Staging   Staging form: Breast, AJCC 8th Edition - Pathologic: Stage IV (pM1) - Signed by Crawford Morna Pickle, NP on 08/21/2023      CHIEF COMPLIANT: Cycle 10 Taxol   HISTORY OF PRESENT ILLNESS:  History of Present Illness   Anner Baity is a 53 year old female with metastatic breast cancer who presents for follow-up and management of treatment side effects.  She is currently undergoing her tenth cycle of chemotherapy with Taxol  for metastatic breast cancer, with two more cycles remaining. The dose was reduced to 50 mg due to neuropathy, which has not worsened since the adjustment. The neuropathy is described as a sensation of 'a sock that has bunched up' in the middle three toes of both feet, without pain or involvement of the hands, pinky, or big toe.  She has experienced significant weight loss and is considering options for breast reconstruction, including implants and DIEP flap, with concerns about the impact on future imaging and ongoing treatment for liver metastasis.  She is currently on Celexa  for depression and anxiety, which has been significant. She increased her dose from 20 mg to 40 mg but feels it is not helping. She is also considering future hormonal therapy options post-radiation, including Zoladex injections or oophorectomy, and anastrozole  with Ibrance.  Her blood work shows normal white count and hemoglobin levels, with improved liver function. She has maintained her protein levels and electrolytes are normal.         ALLERGIES:  has no known allergies.  MEDICATIONS:  Current Outpatient Medications  Medication Sig Dispense Refill   acetaminophen  (TYLENOL ) 500 MG tablet Take 500 mg by mouth every 6 (six) hours as needed for mild pain.     Cholecalciferol (VITAMIN D3) 5000 units CAPS Take 5,000 Units by mouth daily.  citalopram  (CELEXA ) 40 MG tablet Take 1 tablet (40 mg total) by mouth daily. 90 tablet 1   dexamethasone  (DECADRON ) 4 MG tablet Take 1 tablet by mouth the day after and 1 tablet 2 days after chemo 8 tablet 0   Ibuprofen  200 MG CAPS Take 400 mg by mouth daily as needed  (Pain).     lidocaine -prilocaine  (EMLA ) cream Apply to affected area once 30 g 3   loratadine (CLARITIN) 10 MG tablet Take 10 mg by mouth daily. Liquid gel     LORazepam  (ATIVAN ) 0.5 MG tablet Take 1 tablet (0.5 mg total) by mouth 2 (two) times daily as needed for anxiety. 30 tablet 1   nitrofurantoin , macrocrystal-monohydrate, (MACROBID ) 100 MG capsule Take 1 capsule (100 mg total) by mouth 2 (two) times daily. 14 capsule 0   omeprazole  (PRILOSEC) 40 MG capsule Take 1 capsule (40 mg total) by mouth 2 (two) times daily. 60 capsule 1   ondansetron  (ZOFRAN ) 8 MG tablet Take 1 tablet (8 mg total) by mouth every 8 (eight) hours as needed for nausea/vomiting. Start third day after doxorubicin /cyclophosphamide  chemotherapy. 30 tablet 1   prochlorperazine  (COMPAZINE ) 10 MG tablet Take 1 tablet (10 mg total) by mouth every 6 (six) hours as needed for nausea or vomiting. 30 tablet 1   sennosides-docusate sodium (SENOKOT-S) 8.6-50 MG tablet Take 2 tablets by mouth daily.     No current facility-administered medications for this visit.   Facility-Administered Medications Ordered in Other Visits  Medication Dose Route Frequency Provider Last Rate Last Admin   sodium chloride  flush (NS) 0.9 % injection 10 mL  10 mL Intracatheter PRN [name], MD   10 mL at 12/12/23 1609    PHYSICAL EXAMINATION: ECOG PERFORMANCE STATUS: 1 - Symptomatic but completely ambulatory  Vitals:   12/12/23 1255  BP: 124/69  Pulse: 92  Resp: 18  Temp: 98.7 F (37.1 C)  SpO2: 98%   Filed Weights   12/12/23 1255  Weight: 142 lb 6.4 oz (64.6 kg)    LABORATORY DATA:  I have reviewed the data as listed    Latest Ref Rng & Units 12/12/2023   12:24 PM 12/07/2023   10:04 AM 11/28/2023   11:38 AM  CMP  Glucose 70 - 99 mg/dL 99  892  878   BUN 6 - 20 mg/dL 13  11  7    Creatinine 0.44 - 1.00 mg/dL 9.44  9.33  9.33   Sodium 135 - 145 mmol/L 136  135  136   Potassium 3.5 - 5.1 mmol/L 3.5  3.5  3.8   Chloride 98 - 111  mmol/L 104  101  104   CO2 22 - 32 mmol/L 27  26  26    Calcium 8.9 - 10.3 mg/dL 9.3  9.3  9.3   Total Protein 6.5 - 8.1 g/dL 6.5  6.9  6.8   Total Bilirubin 0.0 - 1.2 mg/dL 0.3  0.5  0.4   Alkaline Phos 38 - 126 U/L 68  73  70   AST 15 - 41 U/L 13  29  18    ALT 0 - 44 U/L 13  30  16      Lab Results  Component Value Date   WBC 5.6 12/12/2023   HGB 11.2 (L) 12/12/2023   HCT 33.4 (L) 12/12/2023   MCV 96.8 12/12/2023   PLT 281 12/12/2023   NEUTROABS 4.3 12/12/2023    ASSESSMENT & PLAN:  Malignant neoplasm of upper-inner quadrant of left female breast (HCC)  07/19/2023:Palpable left breast mass for 6 months.  Mammogram and ultrasound revealed large masses.   10:30 position: 4.3 cm with skin thickening and ulceration: Grade 2 IDC with high-grade DCIS ER 95%, PR 95%, Ki67 30%, HER2 2+ by IHC, FISH negative ratio 1.37;  5 o'clock position: 3.3 cm with calcifications spanning 6.7 cm, axilla negative, biopsy: Grade 3 IDC with necrosis ER 95% PR 90% Ki-67 60% HER2 1+ negative   CT CAP 08/03/2023: 2 prominent left breast masses, left axillary lymph nodes, right hepatic lobe lesion concerning for metastatic breast cancer Ultrasound a liver biopsy scheduled for 08/17/2023   Treatment plan: Neoadjuvant chemotherapy with dose Adriamycin  and Cytoxan  followed by Taxol  Mastectomy left breast Adjuvant radiation Antiestrogen therapy with CDK inhibitors -------------------------------------------------------------------------------------------------------------------------------------- Current treatment: Completed 4 cycles of dose dense Adriamycin  and Cytoxan , today is cycle 10 Taxol    Liver biopsy  08/17/2023.  Metastatic breast cancer ER 90%, PR 90%, Ki-67 45%, HER2 0 The reason for using definitive chemotherapy is because she has solitary liver metastasis.   Chemo toxicities: Tongue changes Fatigue Chemo induced anemia: Today's hemoglobin is better.  She will undergo breast MRI and CT scans  for assessment of metastatic disease in the liver. ------------------------------------- Assessment and Plan    Metastatic Breast Cancer Undergoing treatment with Taxol , completed ten cycles with two remaining. Dose reduced to 50 mg due to neuropathy, which has not worsened. Neuropathy localized to three middle toes on both feet, described as a numbing sensation. 50 mg is a common dose for many cancers. Imaging planned to assess liver and breast, with potential surgical interventions based on results. Discussed breast reconstruction options and concerns about expanders impacting future MRIs. Discussed potential liver ablation or resection if residual disease is present. Reviewed risks and benefits of breast surgery and reconstruction, including DIEP flap and implants. Long-term prognosis and life expectancy contingent on treatment response. - Order MRI of the breast - Order CT scan of the liver - Discuss surgical options, including potential liver ablation or resection - Discuss breast reconstruction options, including implants and DIEP flap - Schedule follow-up appointment with the surgeon  Neuropathy Neuropathy in three middle toes on both feet, described as a numbing sensation. Symptoms have not worsened since dose reduction to 50 mg Taxol . - Maintain current Taxol  dose at 50 mg  Menopausal Symptoms Menopause induced by chemotherapy. Considering Zoladex injections or oophorectomy for symptom management. Plan to start anastrozole  and Ibrance post-radiation. - Discuss Zoladex injections or oophorectomy - Plan to start anastrozole  and Ibrance after radiation therapy  Depression and Anxiety Significant depression and anxiety exacerbated by cancer treatment. Currently on Celexa , 40 mg, with limited efficacy. Limited access to therapist due to cost. Discussed potential improvement post-chemotherapy and need for ongoing support. - Refer to in-house chaplain/therapist Olam Corrigan for support -  Reassess mental health status post-surgery to consider potential medication changes  General Health Maintenance Considering breast prosthetics. Advised to explore options before surgery. - Refer to Charol Hawks for breast prosthetics consultation  Follow-up - Schedule CT scan on February 20th - Schedule MRI on February 23rd - Schedule follow-up appointment on February 27th.          Orders Placed This Encounter  Procedures   CT CHEST ABDOMEN PELVIS W CONTRAST    Standing Status:   Future    Expected Date:   12/27/2023    Expiration Date:   12/11/2024    If indicated for the ordered procedure, I authorize the administration of contrast media per Radiology protocol:  Yes    Does the patient have a contrast media/X-ray dye allergy?:   No    Is patient pregnant?:   No    Preferred imaging location?:   [facility]    Release to patient:   Immediate    If indicated for the ordered procedure, I authorize the administration of oral contrast media per Radiology protocol:   Yes   The patient has a good understanding of the overall plan. she agrees with it. she will call with any problems that may develop before the next visit here. Total time spent: 30 mins including face to face time and time spent for planning, charting and co-ordination of care   Viinay K Christen Wardrop, MD 12/12/23

## 2023-12-12 NOTE — Patient Instructions (Signed)
 [facility] - A DEPT OF [place]  Discharge Instructions: Thank you for choosing [facility] to provide your oncology and hematology care.   If you have a lab appointment with the Cancer Center, please go directly to the Cancer Center and check in at the registration area.   Wear comfortable clothing and clothing appropriate for easy access to any Portacath or PICC line.   We strive to give you quality time with your provider. You may need to reschedule your appointment if you arrive late (15 or more minutes).  Arriving late affects you and other patients whose appointments are after yours.  Also, if you miss three or more appointments without notifying the office, you may be dismissed from the clinic at the provider's discretion.      For prescription refill requests, have your pharmacy contact our office and allow 72 hours for refills to be completed.    Today you received the following chemotherapy and/or immunotherapy agents Paclitxel.      To help prevent nausea and vomiting after your treatment, we encourage you to take your nausea medication as directed.  BELOW ARE SYMPTOMS THAT SHOULD BE REPORTED IMMEDIATELY: *FEVER GREATER THAN 100.4 F (38 C) OR HIGHER *CHILLS OR SWEATING *NAUSEA AND VOMITING THAT IS NOT CONTROLLED WITH YOUR NAUSEA MEDICATION *UNUSUAL SHORTNESS OF BREATH *UNUSUAL BRUISING OR BLEEDING *URINARY PROBLEMS (pain or burning when urinating, or frequent urination) *BOWEL PROBLEMS (unusual diarrhea, constipation, pain near the anus) TENDERNESS IN MOUTH AND THROAT WITH OR WITHOUT PRESENCE OF ULCERS (sore throat, sores in mouth, or a toothache) UNUSUAL RASH, SWELLING OR PAIN  UNUSUAL VAGINAL DISCHARGE OR ITCHING   Items with * indicate a potential emergency and should be followed up as soon as possible or go to the Emergency Department if any problems should occur.  Please show the CHEMOTHERAPY ALERT CARD or IMMUNOTHERAPY  ALERT CARD at check-in to the Emergency Department and triage nurse.  Should you have questions after your visit or need to cancel or reschedule your appointment, please contact [facility] - [facility] DEPT [facility]  Dept: [phone]  and follow the prompts.  Office hours are 8:00 a.m. to 4:30 p.m. Monday - Friday. Please note that voicemails left after 4:00 p.m. may not be returned until the following business day.  We are closed weekends and major holidays. You have access to a nurse at all times for urgent questions. Please call the main number to the clinic Dept: [phone] and follow the prompts.   For any non-urgent questions, you may also contact your provider using MyChart. We now offer e-Visits for anyone 59 and older to request care online for non-urgent symptoms. For details visit [email or web address].   Also download the MyChart app! Go to the app store, search MyChart, open the app, select [facility], and log in with your MyChart username and password.

## 2023-12-13 ENCOUNTER — Other Ambulatory Visit: Payer: Self-pay | Admitting: *Deleted

## 2023-12-13 DIAGNOSIS — Z170 Estrogen receptor positive status [ER+]: Secondary | ICD-10-CM

## 2023-12-13 DIAGNOSIS — R160 Hepatomegaly, not elsewhere classified: Secondary | ICD-10-CM

## 2023-12-13 DIAGNOSIS — C50212 Malignant neoplasm of upper-inner quadrant of left female breast: Secondary | ICD-10-CM

## 2023-12-14 ENCOUNTER — Telehealth: Payer: Self-pay | Admitting: *Deleted

## 2023-12-14 ENCOUNTER — Encounter: Payer: Self-pay | Admitting: Hematology and Oncology

## 2023-12-14 ENCOUNTER — Other Ambulatory Visit: Payer: Self-pay

## 2023-12-14 DIAGNOSIS — C50212 Malignant neoplasm of upper-inner quadrant of left female breast: Secondary | ICD-10-CM

## 2023-12-14 NOTE — Telephone Encounter (Signed)
 Spoke to patient to confirm upcoming appointments, gave location, time and prep, patient voiced understanding

## 2023-12-19 NOTE — Assessment & Plan Note (Signed)
07/19/2023:Palpable left breast mass for 6 months.  Mammogram and ultrasound revealed large masses.   10:30 position: 4.3 cm with skin thickening and ulceration: Grade 2 IDC with high-grade DCIS ER 95%, PR 95%, Ki67 30%, HER2 2+ by IHC, FISH negative ratio 1.37;  5 o'clock position: 3.3 cm with calcifications spanning 6.7 cm, axilla negative, biopsy: Grade 3 IDC with necrosis ER 95% PR 90% Ki-67 60% HER2 1+ negative   CT CAP 08/03/2023: 2 prominent left breast masses, left axillary lymph nodes, right hepatic lobe lesion concerning for metastatic breast cancer Ultrasound a liver biopsy scheduled for 08/17/2023   Treatment plan: Neoadjuvant chemotherapy with dose Adriamycin and Cytoxan followed by Taxol Mastectomy left breast Adjuvant radiation Antiestrogen therapy with CDK inhibitors -------------------------------------------------------------------------------------------------------------------------------------- Current treatment: Completed 4 cycles of dose dense Adriamycin and Cytoxan, today is cycle 10 Taxol   Liver biopsy  08/17/2023.  Metastatic breast cancer ER 90%, PR 90%, Ki-67 45%, HER2 0 The reason for using definitive chemotherapy is because she has solitary liver metastasis.   Chemo toxicities: Tongue changes Fatigue Chemo induced anemia: Today's hemoglobin is better.   She will undergo breast MRI, liver MRI and CT scans for assessment of metastatic disease in the liver.

## 2023-12-20 ENCOUNTER — Inpatient Hospital Stay (HOSPITAL_BASED_OUTPATIENT_CLINIC_OR_DEPARTMENT_OTHER): Payer: Medicaid Other | Admitting: Hematology and Oncology

## 2023-12-20 ENCOUNTER — Inpatient Hospital Stay: Payer: Medicaid Other

## 2023-12-20 VITALS — BP 126/75 | HR 99 | Temp 98.4°F | Resp 18 | Ht 66.0 in | Wt 142.0 lb

## 2023-12-20 VITALS — BP 111/69 | HR 79 | Resp 18

## 2023-12-20 DIAGNOSIS — C50212 Malignant neoplasm of upper-inner quadrant of left female breast: Secondary | ICD-10-CM

## 2023-12-20 DIAGNOSIS — Z170 Estrogen receptor positive status [ER+]: Secondary | ICD-10-CM | POA: Diagnosis not present

## 2023-12-20 DIAGNOSIS — Z95828 Presence of other vascular implants and grafts: Secondary | ICD-10-CM

## 2023-12-20 DIAGNOSIS — Z5111 Encounter for antineoplastic chemotherapy: Secondary | ICD-10-CM | POA: Diagnosis not present

## 2023-12-20 LAB — CBC WITH DIFFERENTIAL (CANCER CENTER ONLY)
Abs Immature Granulocytes: 0.02 10*3/uL (ref 0.00–0.07)
Basophils Absolute: 0 10*3/uL (ref 0.0–0.1)
Basophils Relative: 1 %
Eosinophils Absolute: 0.2 10*3/uL (ref 0.0–0.5)
Eosinophils Relative: 4 %
HCT: 35.6 % — ABNORMAL LOW (ref 36.0–46.0)
Hemoglobin: 12 g/dL (ref 12.0–15.0)
Immature Granulocytes: 1 %
Lymphocytes Relative: 19 %
Lymphs Abs: 0.8 10*3/uL (ref 0.7–4.0)
MCH: 32.4 pg (ref 26.0–34.0)
MCHC: 33.7 g/dL (ref 30.0–36.0)
MCV: 96.2 fL (ref 80.0–100.0)
Monocytes Absolute: 0.3 10*3/uL (ref 0.1–1.0)
Monocytes Relative: 6 %
Neutro Abs: 3 10*3/uL (ref 1.7–7.7)
Neutrophils Relative %: 69 %
Platelet Count: 344 10*3/uL (ref 150–400)
RBC: 3.7 MIL/uL — ABNORMAL LOW (ref 3.87–5.11)
RDW: 12.4 % (ref 11.5–15.5)
WBC Count: 4.3 10*3/uL (ref 4.0–10.5)
nRBC: 0 % (ref 0.0–0.2)

## 2023-12-20 LAB — CMP (CANCER CENTER ONLY)
ALT: 12 U/L (ref 0–44)
AST: 14 U/L — ABNORMAL LOW (ref 15–41)
Albumin: 4.1 g/dL (ref 3.5–5.0)
Alkaline Phosphatase: 71 U/L (ref 38–126)
Anion gap: 4 — ABNORMAL LOW (ref 5–15)
BUN: 10 mg/dL (ref 6–20)
CO2: 29 mmol/L (ref 22–32)
Calcium: 9.2 mg/dL (ref 8.9–10.3)
Chloride: 104 mmol/L (ref 98–111)
Creatinine: 0.66 mg/dL (ref 0.44–1.00)
GFR, Estimated: >60 mL/min (ref >60)
Glucose, Bld: 130 mg/dL — ABNORMAL HIGH (ref 70–99)
Potassium: 3.6 mmol/L (ref 3.5–5.1)
Sodium: 137 mmol/L (ref 135–145)
Total Bilirubin: 0.3 mg/dL (ref 0.0–1.2)
Total Protein: 6.7 g/dL (ref 6.5–8.1)

## 2023-12-20 MED ORDER — SODIUM CHLORIDE 0.9 % IV SOLN
Freq: Once | INTRAVENOUS | Status: AC
Start: 2023-12-20 — End: 2023-12-20

## 2023-12-20 MED ORDER — SODIUM CHLORIDE 0.9% FLUSH
10.0000 mL | INTRAVENOUS | Status: DC | PRN
  Administered 2023-12-20: 10 mL

## 2023-12-20 MED ORDER — HEPARIN SOD (PORK) LOCK FLUSH 100 UNIT/ML IV SOLN
500.0000 [IU] | Freq: Once | INTRAVENOUS | Status: AC | PRN
  Administered 2023-12-20: 500 [IU]

## 2023-12-20 MED ORDER — SODIUM CHLORIDE 0.9 % IV SOLN
50.0000 mg/m2 | Freq: Once | INTRAVENOUS | Status: AC
  Administered 2023-12-20: 90 mg via INTRAVENOUS
  Filled 2023-12-20: qty 15

## 2023-12-20 MED ORDER — FAMOTIDINE IN NACL 20-0.9 MG/50ML-% IV SOLN
20.0000 mg | Freq: Once | INTRAVENOUS | Status: AC
Start: 2023-12-20 — End: 2023-12-20
  Administered 2023-12-20: 20 mg via INTRAVENOUS
  Filled 2023-12-20: qty 50

## 2023-12-20 MED ORDER — SODIUM CHLORIDE 0.9% FLUSH
10.0000 mL | Freq: Once | INTRAVENOUS | Status: AC
  Administered 2023-12-20: 10 mL

## 2023-12-20 MED ORDER — DEXAMETHASONE SODIUM PHOSPHATE 10 MG/ML IJ SOLN
10.0000 mg | Freq: Once | INTRAMUSCULAR | Status: AC
Start: 2023-12-20 — End: 2023-12-20
  Administered 2023-12-20: 10 mg via INTRAVENOUS
  Filled 2023-12-20: qty 1

## 2023-12-20 MED ORDER — DIPHENHYDRAMINE HCL 50 MG/ML IJ SOLN
25.0000 mg | Freq: Once | INTRAMUSCULAR | Status: AC
  Administered 2023-12-20: 25 mg via INTRAVENOUS
  Filled 2023-12-20: qty 1

## 2023-12-20 NOTE — Research (Signed)
[id number], ICE COMPRESS: RANDOMIZED TRIAL OF LIMB CRYOCOMPRESSION VERSUS CONTINUOUS COMPRESSION VERSUS LOW CYCLIC COMPRESSION FOR THE PREVENTION  OF TAXANE-INDUCED PERIPHERAL NEUROPATHY   Patient arrives today Unaccompanied for the cycle 11 visit. Confirmed patient does not have wounds, sores, or lesions to extremities. Patient has not had any vaccinations since last visit.   ADVERSE EVENTS: Patient reports no adverse events from study intervention (Paxman machine). Pt denies any motor neuropathy symptoms, but reports ongoing sensory neuropathy symptoms in her toes that has now moved to her big toes (bilaterally)from last week. Pt reports the numbness has spread to her big toes. Patient reports it feels like a "sock in stuck" on the three middle toes bilaterally on both feet. Pt also reports that this sensory neuropathy does not interfere with her everyday life function.  Dr. Pamelia Hoit previously stated the sensory neuropathy was not related to the paxman machine (study intervention) but is probably related to the Taxol. RN assessed pt hands and feet bilaterally, and they had normal color, temperature, and sensation. Patient also reports ongoing fatigue unchanged.     Md Visit: Pt saw Dr. Pamelia Hoit today . Pt reports that Dr. Pamelia Hoit was made aware of her ongoing peripheral sensory neuropathy that has now increased in her feet (now spread to big toes bilaterally). No changes were made for her regimen today.   SOLICITED ADVERSE EVENTS:    Adverse Event CTCAE Grade Relationship to Study Intervention Onset Resolved Action Taken Relationship to chemotherapy Comments  Skin atrophy 0 NA            Skin hyperpigmentation 0 NA            Skin hypopigmentation 0 NA            Skin induration 0 NA            Skin ulceration 0 NA            Rash maculopapular 0 NA            Nail changes 0 NA            Cold intolerance (general disorders and administration site conditions- other) 0 NA            Frostbite (skin and  subcutaneous tissue disorders- other) 0 NA            Fatigue 1 Not related to paxman  machine     none   Pt reports mild fatigue today. Stating she is still able to perform everyday task but does need intermittent rest periods.   Peripheral Sensory Neuropathy   1 Not related to [employer].  11-27-23 per pt report   Paclitaxel dose reduced on 11/28/23. Probably related to Taxol per Dr. Pamelia Hoit.  Pt reports this mild numbness and tingling (middle toes of both feet) does not interfere with her daily function of ADL's and just "feels funny".     Machine malfunction note:  Radio producer pre cooled machines prior to research study intervention in clinic workroom and both devices (arms and legs) were cooled and doing well. When Research RN took both devices to infusion the top arm machine would not move past cooling (and coolest temperature was 16 degrees celsius). A second device was brought to infusion room and had the same issue. The arm device would not cool past 20.3 degrees celsius. A third device was brought to infusion room and worked properly. Pt arms wraps were then applied and study intervention began for pt arms at that  point. Pt leg device worked well throughout and had no issues.    STUDY INTERVENTION & TOLERABILITY ASSESSMENTS: 10:30 am- Wraps applied to pt legs only due to machine malfunction on arm device (two machines would not cool properly); pre-treatment started. Starting temperature 11 degrees Celsius. Starting pressure 5-15 mmHG. 10:40 am- Required 5-15 minute Tolerability check- pt states tolerable to cooling and compression 10:45 am- Wraps applied to pt arms (leg device still working well) due to machine malfunction on arm device early in study intervention today. Starting temperature 11 degrees Celsius. Starting pressure 5-15 mmHG. 10:55 am- Tolerability check- pt states tolerable to cooling and compression. 10 minute check since arms applied late due to machine malfunction.  11:06  am- Taxane infusion start time. Moved to treatment phase. 12:05 pm- Pt took restroom break for 5 minutes 12:10 pm- Wraps reapplied and study intervention restarted.  12:10 pm- Taxane infusion end time. Moved to post-treatment phase.  12:47 pm- Study intervention end time. Pt bathroom break made up in post treatment phase. Confirmed no new wounds, sores, or lesions  Disposition:  All study interventions completed and pt stable. Pt remains in infusion chair with no needs with nursing staff present. Pt will return to clinic on 12-27-23 for her cycle 12 infusion. Pt knows she will get assessments (neuropen, tuning fork, and get up and go); and pro's, as well as research labs with her cycle 12 visit.   Zerita Boers BSN RN Clinical Research Nurse [facility] Direct Dial: [phone] 12/20/2023  1:38 PM

## 2023-12-20 NOTE — Progress Notes (Signed)
Patient Care Team: Ann Daugherty, [name] as PCP - General (Physician Assistant) Obgyn, Ma Hillock as Consulting Physician (Obstetrics and Gynecology) Donalee Citrin, MD as Consulting Physician (Neurosurgery) Donnelly Angelica, RN as Oncology Nurse Navigator Pershing Proud, RN as Oncology Nurse Navigator Serena Croissant, MD as Consulting Physician (Hematology and Oncology) Almond Lint, MD as Consulting Physician (General Surgery) Lonie Peak, MD as Attending Physician (Radiation Oncology)  DIAGNOSIS:  Encounter Diagnosis  Name Primary?   Malignant neoplasm of upper-inner quadrant of left breast in female, estrogen receptor positive (HCC) Yes    SUMMARY OF ONCOLOGIC HISTORY: Oncology History  Malignant neoplasm of upper-inner quadrant of left female breast (HCC)  07/19/2023 Initial Diagnosis   Palpable left breast mass for 6 months.  Mammogram and ultrasound revealed large masses.  10:30 position: 4.3 cm with skin thickening and ulceration: Grade 2 IDC with high-grade DCIS ER 95%, PR 95%, Ki67 30%, HER2 2+ by IHC, FISH negative ratio 1.37; 5 o'clock position: 3.3 cm with calcifications spanning 6.7 cm, axilla negative, biopsy: Grade 3 IDC with necrosis ER 95% PR 90% Ki-67 60% HER2 1+ negative   07/26/2023 Cancer Staging   Staging form: Breast, AJCC 8th Edition - Clinical: Stage IIIB (cT4b, cN0, cM0, G3, ER+, PR+, HER2-) - Signed by Serena Croissant, MD on 07/26/2023 Histologic grading system: 3 grade system   08/15/2023 -  Chemotherapy   Patient is on Treatment Plan : BREAST ADJUVANT DOSE DENSE AC q14d / PACLitaxel q7d     08/17/2023 Initial Biopsy   Liver biopsy: consistent with metastatic carcinoma, breast primary.     08/21/2023 Cancer Staging   Staging form: Breast, AJCC 8th Edition - Pathologic: Stage IV (pM1) - Signed by Loa Socks, NP on 08/21/2023     CHIEF COMPLIANT: Cycle 11 Taxol  HISTORY OF PRESENT ILLNESS:   History of Present Illness   Ann Daugherty  "Ann Daugherty" is a 53 year old female undergoing treatment for breast cancer who presents for follow-up and evaluation of neuropathy symptoms.  She is currently on her eleventh cycle of Taxol as part of her treatment for breast cancer. Multiple diagnostic evaluations are ongoing, including an MRI of the liver scheduled for tomorrow, to assess the extent of her disease.  She experiences numbness and tingling in the three middle toes on both feet, a side effect of Taxol. The numbness begins at the bottom of her big toe. Despite these symptoms, she can still feel hot and cold surfaces when walking barefoot, although she typically wears socks due to personal preference and discomfort with walking barefoot on hardwood floors.  She manages anxiety with Ativan, taken as needed during the day, and has Vistaril available for nighttime use to aid with sleep, which she uses intermittently to avoid tolerance. She finds additional relief from anxiety through wellness activities such as yoga and meditation.         ALLERGIES:  has no known allergies.  MEDICATIONS:  Current Outpatient Medications  Medication Sig Dispense Refill   acetaminophen (TYLENOL) 500 MG tablet Take 500 mg by mouth every 6 (six) hours as needed for mild pain.     Cholecalciferol (VITAMIN D3) 5000 units CAPS Take 5,000 Units by mouth daily.     citalopram (CELEXA) 40 MG tablet Take 1 tablet (40 mg total) by mouth daily. 90 tablet 1   dexamethasone (DECADRON) 4 MG tablet Take 1 tablet by mouth the day after and 1 tablet 2 days after chemo 8 tablet 0   Ibuprofen 200  MG CAPS Take 400 mg by mouth daily as needed (Pain).     lidocaine-prilocaine (EMLA) cream Apply to affected area once 30 g 3   loratadine (CLARITIN) 10 MG tablet Take 10 mg by mouth daily. Liquid gel     LORazepam (ATIVAN) 0.5 MG tablet Take 1 tablet (0.5 mg total) by mouth 2 (two) times daily as needed for anxiety. 30 tablet 1   nitrofurantoin, macrocrystal-monohydrate,  (MACROBID) 100 MG capsule Take 1 capsule (100 mg total) by mouth 2 (two) times daily. 14 capsule 0   omeprazole (PRILOSEC) 40 MG capsule Take 1 capsule (40 mg total) by mouth 2 (two) times daily. 60 capsule 1   ondansetron (ZOFRAN) 8 MG tablet Take 1 tablet (8 mg total) by mouth every 8 (eight) hours as needed for nausea/vomiting. Start third day after doxorubicin/cyclophosphamide chemotherapy. 30 tablet 1   prochlorperazine (COMPAZINE) 10 MG tablet Take 1 tablet (10 mg total) by mouth every 6 (six) hours as needed for nausea or vomiting. 30 tablet 1   sennosides-docusate sodium (SENOKOT-S) 8.6-50 MG tablet Take 2 tablets by mouth daily.     No current facility-administered medications for this visit.    PHYSICAL EXAMINATION: ECOG PERFORMANCE STATUS: 1 - Symptomatic but completely ambulatory  Vitals:   12/20/23 0902  BP: 126/75  Pulse: 99  Resp: 18  Temp: 98.4 F (36.9 C)  SpO2: 98%   Filed Weights   12/20/23 0902  Weight: 142 lb (64.4 kg)      LABORATORY DATA:  I have reviewed the data as listed    Latest Ref Rng & Units 12/20/2023    8:45 AM 12/12/2023   12:24 PM 12/07/2023   10:04 AM  CMP  Glucose 70 - 99 mg/dL 782  99  956   BUN 6 - 20 mg/dL 10  13  11    Creatinine 0.44 - 1.00 mg/dL 2.13  0.86  5.78   Sodium 135 - 145 mmol/L 137  136  135   Potassium 3.5 - 5.1 mmol/L 3.6  3.5  3.5   Chloride 98 - 111 mmol/L 104  104  101   CO2 22 - 32 mmol/L 29  27  26    Calcium 8.9 - 10.3 mg/dL 9.2  9.3  9.3   Total Protein 6.5 - 8.1 g/dL 6.7  6.5  6.9   Total Bilirubin 0.0 - 1.2 mg/dL 0.3  0.3  0.5   Alkaline Phos 38 - 126 U/L 71  68  73   AST 15 - 41 U/L 14  13  29    ALT 0 - 44 U/L 12  13  30      Lab Results  Component Value Date   WBC 4.3 12/20/2023   HGB 12.0 12/20/2023   HCT 35.6 (L) 12/20/2023   MCV 96.2 12/20/2023   PLT 344 12/20/2023   NEUTROABS 3.0 12/20/2023    ASSESSMENT & PLAN:  Malignant neoplasm of upper-inner quadrant of left female breast  (HCC) 07/19/2023:Palpable left breast mass for 6 months.  Mammogram and ultrasound revealed large masses.   10:30 position: 4.3 cm with skin thickening and ulceration: Grade 2 IDC with high-grade DCIS ER 95%, PR 95%, Ki67 30%, HER2 2+ by IHC, FISH negative ratio 1.37;  5 o'clock position: 3.3 cm with calcifications spanning 6.7 cm, axilla negative, biopsy: Grade 3 IDC with necrosis ER 95% PR 90% Ki-67 60% HER2 1+ negative   CT CAP 08/03/2023: 2 prominent left breast masses, left axillary lymph nodes, right hepatic lobe  lesion concerning for metastatic breast cancer Ultrasound a liver biopsy scheduled for 08/17/2023   Treatment plan: Neoadjuvant chemotherapy with dose Adriamycin and Cytoxan followed by Taxol Mastectomy left breast Adjuvant radiation Antiestrogen therapy with CDK inhibitors -------------------------------------------------------------------------------------------------------------------------------------- Current treatment: Completed 4 cycles of dose dense Adriamycin and Cytoxan, today is cycle 11 Taxol   Liver biopsy  08/17/2023.  Metastatic breast cancer ER 90%, PR 90%, Ki-67 45%, HER2 0 The reason for using definitive chemotherapy is because she has solitary liver metastasis.   Chemo toxicities: Tongue changes Fatigue Chemo induced anemia: Today's hemoglobin is better.   She will undergo breast MRI, liver MRI and CT scans for assessment of metastatic disease in the liver. ------------------------------------- Assessment and Plan    Breast Cancer Currently on the eleventh cycle of Taxol. Surgery is deemed feasible and will proceed. - Proceed with breast surgery  Liver Lesion MRI of the liver scheduled for tomorrow to evaluate the lesion. CT scan will also be performed to check for metastases. Discussed major risks of liver surgery and less invasive alternatives like radiofrequency ablation. - Order MRI of the liver - Order CT scan  Chemotherapy-Induced Peripheral  Neuropathy Reports numbness and tingling in the three middle toes on both feet, starting from the bottom of the big toe. Can still feel hot and cold sensations.  Anxiety Inquired about the safety of taking Vistaril (hydroxyzine) at night instead of Ativan. Confirmed it is safe to take Vistaril at night but advised against combining it with Ativan. - Use Vistaril (hydroxyzine) at night as needed for anxiety - Avoid combining Ativan and Vistaril  General Health Maintenance Labs are normal, including white count, hemoglobin, and platelets.  Follow-up - Follow-up appointment on the 20th - Follow-up visit on the 27th.          No orders of the defined types were placed in this encounter.  The patient has a good understanding of the overall plan. she agrees with it. she will call with any problems that may develop before the next visit here. Total time spent: 30 mins including face to face time and time spent for planning, charting and co-ordination of care   Tamsen Meek, MD 12/20/23

## 2023-12-20 NOTE — Patient Instructions (Signed)

## 2023-12-21 ENCOUNTER — Ambulatory Visit (HOSPITAL_COMMUNITY)
Admission: RE | Admit: 2023-12-21 | Discharge: 2023-12-21 | Disposition: A | Discharge status: Home / Self Care | Payer: Medicaid Other | Source: Ambulatory Visit | Attending: Hematology and Oncology | Admitting: Hematology and Oncology

## 2023-12-21 ENCOUNTER — Telehealth: Payer: Self-pay | Admitting: *Deleted

## 2023-12-21 DIAGNOSIS — R160 Hepatomegaly, not elsewhere classified: Secondary | ICD-10-CM | POA: Insufficient documentation

## 2023-12-21 DIAGNOSIS — C50212 Malignant neoplasm of upper-inner quadrant of left female breast: Secondary | ICD-10-CM | POA: Diagnosis not present

## 2023-12-21 DIAGNOSIS — C787 Secondary malignant neoplasm of liver and intrahepatic bile duct: Secondary | ICD-10-CM | POA: Diagnosis not present

## 2023-12-21 DIAGNOSIS — Z170 Estrogen receptor positive status [ER+]: Secondary | ICD-10-CM | POA: Insufficient documentation

## 2023-12-21 DIAGNOSIS — C50919 Malignant neoplasm of unspecified site of unspecified female breast: Secondary | ICD-10-CM | POA: Diagnosis not present

## 2023-12-21 MED ORDER — GADOBUTROL 1 MMOL/ML IV SOLN
6.0000 mL | Freq: Once | INTRAVENOUS | Status: AC | PRN
  Administered 2023-12-21: 6 mL via INTRAVENOUS

## 2023-12-21 NOTE — Telephone Encounter (Signed)
Received message from scheduling team stating pt would like advice if she can use a PIV for upcoming IV contrast with CT scan or if she has to use port.  RN attempt x1 to contact pt, no answer.  LVM.

## 2023-12-24 ENCOUNTER — Other Ambulatory Visit (HOSPITAL_COMMUNITY): Payer: Self-pay

## 2023-12-24 ENCOUNTER — Encounter: Payer: Self-pay | Admitting: Hematology and Oncology

## 2023-12-24 ENCOUNTER — Ambulatory Visit (HOSPITAL_COMMUNITY)
Admission: RE | Admit: 2023-12-24 | Discharge: 2023-12-24 | Disposition: A | Discharge status: Home / Self Care | Payer: Medicaid Other | Source: Ambulatory Visit | Attending: Hematology and Oncology | Admitting: Hematology and Oncology

## 2023-12-24 DIAGNOSIS — C50912 Malignant neoplasm of unspecified site of left female breast: Secondary | ICD-10-CM | POA: Diagnosis not present

## 2023-12-24 DIAGNOSIS — Z170 Estrogen receptor positive status [ER+]: Secondary | ICD-10-CM | POA: Insufficient documentation

## 2023-12-24 DIAGNOSIS — C50212 Malignant neoplasm of upper-inner quadrant of left female breast: Secondary | ICD-10-CM | POA: Insufficient documentation

## 2023-12-24 DIAGNOSIS — K8689 Other specified diseases of pancreas: Secondary | ICD-10-CM | POA: Diagnosis not present

## 2023-12-24 DIAGNOSIS — C787 Secondary malignant neoplasm of liver and intrahepatic bile duct: Secondary | ICD-10-CM | POA: Diagnosis not present

## 2023-12-24 DIAGNOSIS — R590 Localized enlarged lymph nodes: Secondary | ICD-10-CM | POA: Diagnosis not present

## 2023-12-24 MED ORDER — IOHEXOL 300 MG/ML  SOLN
100.0000 mL | Freq: Once | INTRAMUSCULAR | Status: AC | PRN
  Administered 2023-12-24: 100 mL via INTRAVENOUS

## 2023-12-26 ENCOUNTER — Telehealth: Payer: Self-pay | Admitting: *Deleted

## 2023-12-26 NOTE — Telephone Encounter (Signed)
 Called reading room to request mri liver and ct results.

## 2023-12-27 ENCOUNTER — Other Ambulatory Visit (HOSPITAL_COMMUNITY): Payer: Self-pay

## 2023-12-27 ENCOUNTER — Inpatient Hospital Stay: Payer: Medicaid Other

## 2023-12-27 ENCOUNTER — Encounter: Payer: Self-pay | Admitting: Hematology and Oncology

## 2023-12-27 ENCOUNTER — Inpatient Hospital Stay (HOSPITAL_BASED_OUTPATIENT_CLINIC_OR_DEPARTMENT_OTHER): Payer: Medicaid Other | Admitting: Hematology and Oncology

## 2023-12-27 ENCOUNTER — Other Ambulatory Visit: Payer: Self-pay | Admitting: *Deleted

## 2023-12-27 VITALS — BP 138/67 | HR 79 | Temp 98.0°F | Resp 18

## 2023-12-27 DIAGNOSIS — C50212 Malignant neoplasm of upper-inner quadrant of left female breast: Secondary | ICD-10-CM | POA: Diagnosis not present

## 2023-12-27 DIAGNOSIS — Z170 Estrogen receptor positive status [ER+]: Secondary | ICD-10-CM | POA: Diagnosis not present

## 2023-12-27 DIAGNOSIS — Z95828 Presence of other vascular implants and grafts: Secondary | ICD-10-CM

## 2023-12-27 DIAGNOSIS — Z5111 Encounter for antineoplastic chemotherapy: Secondary | ICD-10-CM | POA: Diagnosis not present

## 2023-12-27 LAB — CBC WITH DIFFERENTIAL (CANCER CENTER ONLY)
Abs Immature Granulocytes: 0.03 10*3/uL (ref 0.00–0.07)
Basophils Absolute: 0 10*3/uL (ref 0.0–0.1)
Basophils Relative: 1 %
Eosinophils Absolute: 0.1 10*3/uL (ref 0.0–0.5)
Eosinophils Relative: 1 %
HCT: 34.4 % — ABNORMAL LOW (ref 36.0–46.0)
Hemoglobin: 11.5 g/dL — ABNORMAL LOW (ref 12.0–15.0)
Immature Granulocytes: 1 %
Lymphocytes Relative: 13 %
Lymphs Abs: 0.8 10*3/uL (ref 0.7–4.0)
MCH: 32.1 pg (ref 26.0–34.0)
MCHC: 33.4 g/dL (ref 30.0–36.0)
MCV: 96.1 fL (ref 80.0–100.0)
Monocytes Absolute: 0.4 10*3/uL (ref 0.1–1.0)
Monocytes Relative: 7 %
Neutro Abs: 4.8 10*3/uL (ref 1.7–7.7)
Neutrophils Relative %: 77 %
Platelet Count: 305 10*3/uL (ref 150–400)
RBC: 3.58 MIL/uL — ABNORMAL LOW (ref 3.87–5.11)
RDW: 12.2 % (ref 11.5–15.5)
WBC Count: 6.1 10*3/uL (ref 4.0–10.5)
nRBC: 0 % (ref 0.0–0.2)

## 2023-12-27 LAB — CMP (CANCER CENTER ONLY)
ALT: 11 U/L (ref 0–44)
AST: 14 U/L — ABNORMAL LOW (ref 15–41)
Albumin: 4 g/dL (ref 3.5–5.0)
Alkaline Phosphatase: 68 U/L (ref 38–126)
Anion gap: 4 — ABNORMAL LOW (ref 5–15)
BUN: 11 mg/dL (ref 6–20)
CO2: 28 mmol/L (ref 22–32)
Calcium: 9 mg/dL (ref 8.9–10.3)
Chloride: 104 mmol/L (ref 98–111)
Creatinine: 0.6 mg/dL (ref 0.44–1.00)
GFR, Estimated: >60 mL/min (ref >60)
Glucose, Bld: 100 mg/dL — ABNORMAL HIGH (ref 70–99)
Potassium: 4.1 mmol/L (ref 3.5–5.1)
Sodium: 136 mmol/L (ref 135–145)
Total Bilirubin: 0.2 mg/dL (ref 0.0–1.2)
Total Protein: 6.2 g/dL — ABNORMAL LOW (ref 6.5–8.1)

## 2023-12-27 LAB — RESEARCH LABS

## 2023-12-27 MED ORDER — DIPHENHYDRAMINE HCL 50 MG/ML IJ SOLN
25.0000 mg | Freq: Once | INTRAMUSCULAR | Status: AC
  Administered 2023-12-27: 25 mg via INTRAVENOUS
  Filled 2023-12-27: qty 1

## 2023-12-27 MED ORDER — SODIUM CHLORIDE 0.9% FLUSH: 10.0000 mL | Freq: Once | INTRAVENOUS | Status: DC

## 2023-12-27 MED ORDER — SODIUM CHLORIDE 0.9 % IV SOLN
50.0000 mg/m2 | Freq: Once | INTRAVENOUS | Status: AC
  Administered 2023-12-27: 90 mg via INTRAVENOUS
  Filled 2023-12-27: qty 15

## 2023-12-27 MED ORDER — FAMOTIDINE IN NACL 20-0.9 MG/50ML-% IV SOLN
20.0000 mg | Freq: Once | INTRAVENOUS | Status: AC
  Administered 2023-12-27: 20 mg via INTRAVENOUS
  Filled 2023-12-27: qty 50

## 2023-12-27 MED ORDER — HEPARIN SOD (PORK) LOCK FLUSH 100 UNIT/ML IV SOLN: 500.0000 [IU] | Freq: Once | INTRAVENOUS | Status: DC

## 2023-12-27 MED ORDER — TAMOXIFEN CITRATE 20 MG PO TABS
20.0000 mg | ORAL_TABLET | Freq: Every day | ORAL | 1 refills | Status: DC
  Filled 2023-12-27: qty 30, 30d supply, fill #0
  Filled 2024-02-04: qty 30, 30d supply, fill #1

## 2023-12-27 MED ORDER — HEPARIN SOD (PORK) LOCK FLUSH 100 UNIT/ML IV SOLN
500.0000 [IU] | Freq: Once | INTRAVENOUS | Status: AC | PRN
  Administered 2023-12-27: 500 [IU]

## 2023-12-27 MED ORDER — SODIUM CHLORIDE 0.9 % IV SOLN: Freq: Once | INTRAVENOUS | Status: AC

## 2023-12-27 MED ORDER — DEXAMETHASONE SODIUM PHOSPHATE 10 MG/ML IJ SOLN
10.0000 mg | Freq: Once | INTRAMUSCULAR | Status: AC
  Administered 2023-12-27: 10 mg via INTRAVENOUS
  Filled 2023-12-27: qty 1

## 2023-12-27 MED ORDER — SODIUM CHLORIDE 0.9% FLUSH
10.0000 mL | INTRAVENOUS | Status: DC | PRN
  Administered 2023-12-27: 10 mL

## 2023-12-27 NOTE — Patient Instructions (Signed)

## 2023-12-27 NOTE — Progress Notes (Signed)
 Patient Care Team: [name], [name] as PCP - General (Physician Assistant) Obgyn, Ma Hillock as Consulting Physician (Obstetrics and Gynecology) Donalee Citrin, MD as Consulting Physician (Neurosurgery) Donnelly Angelica, RN as Oncology Nurse Navigator Pershing Proud, RN as Oncology Nurse Navigator Serena Croissant, MD as Consulting Physician (Hematology and Oncology) Almond Lint, MD as Consulting Physician (General Surgery) Lonie Peak, MD as Attending Physician (Radiation Oncology)  DIAGNOSIS:  Encounter Diagnosis  Name Primary?   Malignant neoplasm of upper-inner quadrant of left breast in female, estrogen receptor positive (HCC) Yes    SUMMARY OF ONCOLOGIC HISTORY: Oncology History  Malignant neoplasm of upper-inner quadrant of left female breast (HCC)  07/19/2023 Initial Diagnosis   Palpable left breast mass for 6 months.  Mammogram and ultrasound revealed large masses.  10:30 position: 4.3 cm with skin thickening and ulceration: Grade 2 IDC with high-grade DCIS ER 95%, PR 95%, Ki67 30%, HER2 2+ by IHC, FISH negative ratio 1.37; 5 o'clock position: 3.3 cm with calcifications spanning 6.7 cm, axilla negative, biopsy: Grade 3 IDC with necrosis ER 95% PR 90% Ki-67 60% HER2 1+ negative   07/26/2023 Cancer Staging   Staging form: Breast, AJCC 8th Edition - Clinical: Stage IIIB (cT4b, cN0, cM0, G3, ER+, PR+, HER2-) - Signed by Serena Croissant, MD on 07/26/2023 Histologic grading system: 3 grade system   08/15/2023 -  Chemotherapy   Patient is on Treatment Plan : BREAST ADJUVANT DOSE DENSE AC q14d / PACLitaxel q7d     08/17/2023 Initial Biopsy   Liver biopsy: consistent with metastatic carcinoma, breast primary.     08/21/2023 Cancer Staging   Staging form: Breast, AJCC 8th Edition - Pathologic: Stage IV (pM1) - Signed by Loa Socks, NP on 08/21/2023     CHIEF COMPLIANT: Cycle 12 Taxol  HISTORY OF PRESENT ILLNESS: Ann Daugherty is a 54 year old with above-mentioned history  of breast cancer who is currently being treated with weekly Taxol treatments.  She is tolerating the treatment fairly well.  Neuropathy was an issue for which we had to reduce the dosage and the neuropathy is starting to get better.  She had CT scans and liver MRI and she is here today to discuss those results.  ALLERGIES:  has no known allergies.  MEDICATIONS:  Current Outpatient Medications  Medication Sig Dispense Refill   acetaminophen (TYLENOL) 500 MG tablet Take 500 mg by mouth every 6 (six) hours as needed for mild pain.     Cholecalciferol (VITAMIN D3) 5000 units CAPS Take 5,000 Units by mouth daily.     citalopram (CELEXA) 40 MG tablet Take 1 tablet (40 mg total) by mouth daily. 90 tablet 1   dexamethasone (DECADRON) 4 MG tablet Take 1 tablet by mouth the day after and 1 tablet 2 days after chemo 8 tablet 0   Ibuprofen 200 MG CAPS Take 400 mg by mouth daily as needed (Pain).     lidocaine-prilocaine (EMLA) cream Apply to affected area once 30 g 3   loratadine (CLARITIN) 10 MG tablet Take 10 mg by mouth daily. Liquid gel     LORazepam (ATIVAN) 0.5 MG tablet Take 1 tablet (0.5 mg total) by mouth 2 (two) times daily as needed for anxiety. 30 tablet 1   nitrofurantoin, macrocrystal-monohydrate, (MACROBID) 100 MG capsule Take 1 capsule (100 mg total) by mouth 2 (two) times daily. 14 capsule 0   omeprazole (PRILOSEC) 40 MG capsule Take 1 capsule (40 mg total) by mouth 2 (two) times daily. 60 capsule 1   ondansetron (  ZOFRAN) 8 MG tablet Take 1 tablet (8 mg total) by mouth every 8 (eight) hours as needed for nausea/vomiting. Start third day after doxorubicin/cyclophosphamide chemotherapy. 30 tablet 1   prochlorperazine (COMPAZINE) 10 MG tablet Take 1 tablet (10 mg total) by mouth every 6 (six) hours as needed for nausea or vomiting. 30 tablet 1   sennosides-docusate sodium (SENOKOT-S) 8.6-50 MG tablet Take 2 tablets by mouth daily.     tamoxifen (NOLVADEX) 20 MG tablet Take 1 tablet (20 mg  total) by mouth daily. Stop 1 week before surgery 30 tablet 1   No current facility-administered medications for this visit.   Facility-Administered Medications Ordered in Other Visits  Medication Dose Route Frequency Provider Last Rate Last Admin   famotidine (PEPCID) IVPB 20 mg premix  20 mg Intravenous Once Serena Croissant, MD 200 mL/hr at 12/27/23 1053 20 mg at 12/27/23 1053   heparin lock flush 100 unit/mL  500 Units Intracatheter Once PRN Serena Croissant, MD       PACLitaxel (TAXOL) 90 mg in sodium chloride 0.9 % 250 mL chemo infusion (</= 80mg /m2)  50 mg/m2 (Treatment Plan Recorded) Intravenous Once Serena Croissant, MD       sodium chloride flush (NS) 0.9 % injection 10 mL  10 mL Intracatheter PRN Serena Croissant, MD        PHYSICAL EXAMINATION: ECOG PERFORMANCE STATUS: 1 - Symptomatic but completely ambulatory    LABORATORY DATA:  I have reviewed the data as listed    Latest Ref Rng & Units 12/27/2023    9:30 AM 12/20/2023    8:45 AM 12/12/2023   12:24 PM  CMP  Glucose 70 - 99 mg/dL 045  409  99   BUN 6 - 20 mg/dL 11  10  13    Creatinine 0.44 - 1.00 mg/dL 8.11  9.14  7.82   Sodium 135 - 145 mmol/L 136  137  136   Potassium 3.5 - 5.1 mmol/L 4.1  3.6  3.5   Chloride 98 - 111 mmol/L 104  104  104   CO2 22 - 32 mmol/L 28  29  27    Calcium 8.9 - 10.3 mg/dL 9.0  9.2  9.3   Total Protein 6.5 - 8.1 g/dL 6.2  6.7  6.5   Total Bilirubin 0.0 - 1.2 mg/dL 0.2  0.3  0.3   Alkaline Phos 38 - 126 U/L 68  71  68   AST 15 - 41 U/L 14  14  13    ALT 0 - 44 U/L 11  12  13      Lab Results  Component Value Date   WBC 6.1 12/27/2023   HGB 11.5 (L) 12/27/2023   HCT 34.4 (L) 12/27/2023   MCV 96.1 12/27/2023   PLT 305 12/27/2023   NEUTROABS 4.8 12/27/2023    ASSESSMENT & PLAN:  Malignant neoplasm of upper-inner quadrant of left female breast (HCC) 07/19/2023:Palpable left breast mass for 6 months.  Mammogram and ultrasound revealed large masses.   10:30 position: 4.3 cm with skin thickening and  ulceration: Grade 2 IDC with high-grade DCIS ER 95%, PR 95%, Ki67 30%, HER2 2+ by IHC, FISH negative ratio 1.37;  5 o'clock position: 3.3 cm with calcifications spanning 6.7 cm, axilla negative, biopsy: Grade 3 IDC with necrosis ER 95% PR 90% Ki-67 60% HER2 1+ negative   CT CAP 08/03/2023: 2 prominent left breast masses, left axillary lymph nodes, right hepatic lobe lesion concerning for metastatic breast cancer Ultrasound a liver biopsy scheduled for  08/17/2023   Treatment plan: Neoadjuvant chemotherapy with dose Adriamycin and Cytoxan followed by Taxol Mastectomy left breast Adjuvant radiation Antiestrogen therapy with CDK inhibitors -------------------------------------------------------------------------------------------------------------------------------------- Current treatment: Completed 4 cycles of dose dense Adriamycin and Cytoxan, today is cycle 12 Taxol   Liver biopsy  08/17/2023.  Metastatic breast cancer ER 90%, PR 90%, Ki-67 45%, HER2 0 The reason for using definitive chemotherapy is because she has solitary liver metastasis.   Chemo toxicities: Tongue changes Fatigue Chemo induced anemia: Today's hemoglobin is better.   MRI liver: Significant diminished size of liver metastases (0.6 cm, 1.4 cm, 0.7 cm) CT CAP: 12/26/2023: Significant reduction in the left breast masses, resolution of the left axillary lymph nodes, decreased liver metastases Breast MRI pending  Discussion: We had a lengthy discussion about treatment plan.  In spite of her having metastatic breast cancer, because of excellent response to her chemotherapy we are treating her with definitive intent surgery followed by radiation and liver directed therapy.  Patient understands that this is not standard of care however given that she has done excellently and has good performance status and young age, we are planning to stay aggressive and try and eradicate all evidence of disease if possible. I sent a message to  Dr. Archer Asa to evaluate her liver MRI and to see if she would be eligible for liver ablation or Y90.    No orders of the defined types were placed in this encounter.  The patient has a good understanding of the overall plan. she agrees with it. she will call with any problems that may develop before the next visit here. Total time spent: 30 mins including face to face time and time spent for planning, charting and co-ordination of care   Tamsen Meek, MD 12/27/23

## 2023-12-27 NOTE — Telephone Encounter (Signed)
 I discussed with the patient the results of the CT scans and the liver MRI. She has a responded very well to chemotherapy.  Today she receives her last dose of treatment. She will undergo breast conserving surgery (we are fully aware that she has liver metastases) Our recommendation would be to pursue liver directed therapy like ambulation after surgery This would ensure that all active disease has been either removed or ablated. Following radiation we will consider treatment with Verzinio and tamoxifen.  Because she is very concerned about the gap between chemo and doing surgery, I will start her on tamoxifen which she will need to discontinue 1 week before surgery.

## 2023-12-27 NOTE — Assessment & Plan Note (Signed)
 07/19/2023:Palpable left breast mass for 6 months.  Mammogram and ultrasound revealed large masses.   10:30 position: 4.3 cm with skin thickening and ulceration: Grade 2 IDC with high-grade DCIS ER 95%, PR 95%, Ki67 30%, HER2 2+ by IHC, FISH negative ratio 1.37;  5 o'clock position: 3.3 cm with calcifications spanning 6.7 cm, axilla negative, biopsy: Grade 3 IDC with necrosis ER 95% PR 90% Ki-67 60% HER2 1+ negative   CT CAP 08/03/2023: 2 prominent left breast masses, left axillary lymph nodes, right hepatic lobe lesion concerning for metastatic breast cancer Ultrasound a liver biopsy scheduled for 08/17/2023   Treatment plan: Neoadjuvant chemotherapy with dose Adriamycin and Cytoxan followed by Taxol Mastectomy left breast Adjuvant radiation Antiestrogen therapy with CDK inhibitors -------------------------------------------------------------------------------------------------------------------------------------- Current treatment: Completed 4 cycles of dose dense Adriamycin and Cytoxan, today is cycle 12 Taxol   Liver biopsy  08/17/2023.  Metastatic breast cancer ER 90%, PR 90%, Ki-67 45%, HER2 0 The reason for using definitive chemotherapy is because she has solitary liver metastasis.   Chemo toxicities: Tongue changes Fatigue Chemo induced anemia: Today's hemoglobin is better.   MRI liver: Significant diminished size of liver metastases (0.6 cm, 1.4 cm, 0.7 cm) CT CAP: 12/26/2023: Significant reduction in the left breast masses, resolution of the left axillary lymph nodes, decreased liver metastases Breast MRI pending  Discussion: We had a lengthy discussion about treatment plan.  In spite of her having metastatic breast cancer, because of excellent response to her chemotherapy we are treating her with definitive intent surgery followed by radiation and liver directed therapy.  Patient understands that this is not standard of care however given that she has done excellently and has  good performance status and young age, we are planning to stay aggressive and try and eradicate all evidence of disease if possible. I sent a message to Dr. Archer Asa to evaluate her liver MRI and to see if she would be eligible for liver ablation or Y90.

## 2023-12-27 NOTE — Research (Signed)
 [id number], ICE COMPRESS: RANDOMIZED TRIAL OF LIMB CRYOCOMPRESSION VERSUS CONTINUOUS COMPRESSION VERSUS LOW CYCLIC COMPRESSION FOR THE PREVENTION  OF TAXANE-INDUCED PERIPHERAL NEUROPATHY   Patient arrives today Unaccompanied for the cycle 12 visit. Confirmed patient does not have wounds, sores, or lesions to extremities. Patient has not had any vaccinations since last visit.   ADVERSE EVENTS: Patient reports no adverse events from study intervention (Paxman machine). Pt denies any motor neuropathy symptoms, but reports ongoing sensory neuropathy symptoms in her toes that has now moved to her big toes (bilaterally)from last week. Pt reports the numbness that had spread to her big toes with last visit is now gone and the numbness in her middle toes has also improved. Patient reports it feels like a "sock in stuck" on the three middle toes bilaterally on both feet. Pt also reports that this sensory neuropathy does not interfere with her everyday life function.  Dr. Pamelia Hoit previously stated the sensory neuropathy was not related to the paxman machine (study intervention) but is probably related to the Taxol. RN assessed pt hands and feet bilaterally, and they had normal color, temperature, and sensation. Patient also reports ongoing fatigue unchanged.      Md Visit: Pt saw Dr. Pamelia Hoit today.   PRO's:  Pt 12 week PROs completed by pt prior to study intervention.   Assessments:  Pt 12 week assessments (nueropen, tuning fork, get up and go) were completed by research rn prior to study intervention.   SOLICITED ADVERSE EVENTS:    Adverse Event CTCAE Grade Relationship to Study Intervention Onset Resolved Action Taken Relationship to chemotherapy Comments  Skin atrophy 0 NA            Skin hyperpigmentation 0 NA            Skin hypopigmentation 0 NA            Skin induration 0 NA            Skin ulceration 0 NA            Rash maculopapular 0 NA            Nail changes 0 NA            Cold intolerance  (general disorders and administration site conditions- other) 0 NA            Frostbite (skin and subcutaneous tissue disorders- other) 0 NA            Fatigue 1 Not related to paxman  machine     none   Pt reports mild fatigue today. Stating she is still able to perform everyday task but does need intermittent rest periods.   Peripheral Sensory Neuropathy   1 Not related to [employer].  11-27-23 per pt report   Paclitaxel dose reduced on 11/28/23. Probably related to Taxol per Dr. Pamelia Hoit.  Pt reports this mild numbness and tingling (middle toes of both feet) does not interfere with her daily function of ADL's and just "feels funny".      STUDY INTERVENTION & TOLERABILITY ASSESSMENTS: 11:10 am- Wraps applied and pre-treatment started. Starting temperature 11 degrees Celsius. Starting pressure 5-15 mmHG. 11:20 am- Required 5-15 minute Tolerability check- pt states tolerable to cooling and compression 11:45 am- Taxane infusion start time. Moved to treatment phase. 12:30 pm- Pt took restroom break for 5 minutes 12:35 pm- Wraps reapplied and study intervention restarted.  12:48 pm- Taxane infusion end time. Moved to post-treatment phase.  1:25 pm- Study intervention end  time. Pt bathroom break made up in post treatment phase. Confirmed no new wounds, sores, or lesions   Disposition:  All study interventions completed and pt stable. Pt remains in infusion chair with no needs with nursing staff present. Research RN will see pt again in 12 weeks for follow up. Pt knows she will get assessments (neuropen, tuning fork, and get up and go); and pro's, with this 12 week follow up visit.   Zerita Boers BSN RN Clinical Research Nurse [facility] Direct Dial: [phone] 12/27/2023  2:25 PM

## 2023-12-30 ENCOUNTER — Ambulatory Visit
Admission: RE | Admit: 2023-12-30 | Discharge: 2023-12-30 | Disposition: A | Discharge status: Home / Self Care | Payer: Medicaid Other | Source: Ambulatory Visit | Attending: Hematology and Oncology | Admitting: Hematology and Oncology

## 2023-12-30 ENCOUNTER — Other Ambulatory Visit: Payer: Self-pay

## 2023-12-30 DIAGNOSIS — Z9189 Other specified personal risk factors, not elsewhere classified: Secondary | ICD-10-CM

## 2023-12-30 MED ORDER — GADOPICLENOL 0.5 MMOL/ML IV SOLN
7.0000 mL | Freq: Once | INTRAVENOUS | Status: AC | PRN
  Administered 2023-12-30: 7 mL via INTRAVENOUS

## 2023-12-31 ENCOUNTER — Encounter: Payer: Self-pay | Admitting: *Deleted

## 2023-12-31 ENCOUNTER — Other Ambulatory Visit (HOSPITAL_COMMUNITY): Payer: Self-pay

## 2024-01-02 ENCOUNTER — Telehealth: Payer: Self-pay

## 2024-01-02 NOTE — Telephone Encounter (Signed)
 [id number], ICE COMPRESS: RANDOMIZED TRIAL OF LIMB CRYOCOMPRESSION VERSUS CONTINUOUS COMPRESSION VERSUS LOW CYCLIC COMPRESSION FOR THE PREVENTION  OF TAXANE-INDUCED PERIPHERAL NEUROPATHY  RN called pt to inform her that she will follow up with her on 02-21-24 when she comes in for port flush. RN will perform necessary week 24 assessments and obtain necessary pros with this visit. Pt had no questions or concerns at this time. Pt has contact information if needed for further questions or concerns.   Zerita Boers BSN RN Clinical Research Nurse [facility] Direct Dial: [phone] 01/02/2024  2:59 PM

## 2024-01-03 ENCOUNTER — Other Ambulatory Visit: Payer: Self-pay | Admitting: Hematology and Oncology

## 2024-01-03 ENCOUNTER — Inpatient Hospital Stay (HOSPITAL_BASED_OUTPATIENT_CLINIC_OR_DEPARTMENT_OTHER): Payer: Medicaid Other | Admitting: Hematology and Oncology

## 2024-01-03 VITALS — BP 125/73 | HR 78 | Temp 98.2°F | Resp 18 | Ht 66.0 in | Wt 143.3 lb

## 2024-01-03 DIAGNOSIS — Z170 Estrogen receptor positive status [ER+]: Secondary | ICD-10-CM | POA: Diagnosis not present

## 2024-01-03 DIAGNOSIS — C50212 Malignant neoplasm of upper-inner quadrant of left female breast: Secondary | ICD-10-CM

## 2024-01-03 DIAGNOSIS — R160 Hepatomegaly, not elsewhere classified: Secondary | ICD-10-CM

## 2024-01-03 DIAGNOSIS — Z5111 Encounter for antineoplastic chemotherapy: Secondary | ICD-10-CM | POA: Diagnosis not present

## 2024-01-03 NOTE — Assessment & Plan Note (Signed)
 07/19/2023:Palpable left breast mass for 6 months.  Mammogram and ultrasound revealed large masses.   10:30 position: 4.3 cm with skin thickening and ulceration: Grade 2 IDC with high-grade DCIS ER 95%, PR 95%, Ki67 30%, HER2 2+ by IHC, FISH negative ratio 1.37;  5 o'clock position: 3.3 cm with calcifications spanning 6.7 cm, axilla negative, biopsy: Grade 3 IDC with necrosis ER 95% PR 90% Ki-67 60% HER2 1+ negative   CT CAP 08/03/2023: 2 prominent left breast masses, left axillary lymph nodes, right hepatic lobe lesion concerning for metastatic breast cancer Ultrasound a liver biopsy scheduled for 08/17/2023   Treatment plan: Neoadjuvant chemotherapy with dose Adriamycin and Cytoxan followed by Taxol completed 12/27/2023 Mastectomy left breast Adjuvant radiation Antiestrogen therapy with CDK inhibitors ---------------------------------------------------------------------------------------------------- MRI liver 12/26/2023: Significantly diminished size of hepatic metastases (0.6 cm, 1.4 cm, (used to be 3.6 cm), 0.7 cm) MRI breast 12/31/2023: Significant positive response to chemo.  Both left malignancies decrease in size (3 cm with enhancement) and 1.1 cm CT CAP 12/26/2023: Significant decrease in breast masses, resolution of left axillary lymph nodes, decrease liver metastases  Return to clinic after surgery to discuss final pathology report.

## 2024-01-03 NOTE — Progress Notes (Signed)
 Patient Care Team: [name], [name] as PCP - General (Physician Assistant) Obgyn, Ma Hillock as Consulting Physician (Obstetrics and Gynecology) Donalee Citrin, MD as Consulting Physician (Neurosurgery) Donnelly Angelica, RN as Oncology Nurse Navigator Pershing Proud, RN as Oncology Nurse Navigator Serena Croissant, MD as Consulting Physician (Hematology and Oncology) Almond Lint, MD as Consulting Physician (General Surgery) Lonie Peak, MD as Attending Physician (Radiation Oncology)  DIAGNOSIS:  Encounter Diagnosis  Name Primary?   Malignant neoplasm of upper-inner quadrant of left breast in female, estrogen receptor positive (HCC) Yes    SUMMARY OF ONCOLOGIC HISTORY: Oncology History  Malignant neoplasm of upper-inner quadrant of left female breast (HCC)  07/19/2023 Initial Diagnosis   Palpable left breast mass for 6 months.  Mammogram and ultrasound revealed large masses.  10:30 position: 4.3 cm with skin thickening and ulceration: Grade 2 IDC with high-grade DCIS ER 95%, PR 95%, Ki67 30%, HER2 2+ by IHC, FISH negative ratio 1.37; 5 o'clock position: 3.3 cm with calcifications spanning 6.7 cm, axilla negative, biopsy: Grade 3 IDC with necrosis ER 95% PR 90% Ki-67 60% HER2 1+ negative   07/26/2023 Cancer Staging   Staging form: Breast, AJCC 8th Edition - Clinical: Stage IIIB (cT4b, cN0, cM0, G3, ER+, PR+, HER2-) - Signed by Serena Croissant, MD on 07/26/2023 Histologic grading system: 3 grade system   08/15/2023 -  Chemotherapy   Patient is on Treatment Plan : BREAST ADJUVANT DOSE DENSE AC q14d / PACLitaxel q7d     08/17/2023 Initial Biopsy   Liver biopsy: consistent with metastatic carcinoma, breast primary.     08/21/2023 Cancer Staging   Staging form: Breast, AJCC 8th Edition - Pathologic: Stage IV (pM1) - Signed by Loa Socks, NP on 08/21/2023     CHIEF COMPLIANT: Follow-up to discuss results of breast MRI and CT scans  HISTORY OF PRESENT ILLNESS:Ann J  Daugherty "[name]" is a 53 year old female with breast cancer and liver lesions who presents for surgical planning and treatment discussion.  Imaging studies, including MRI and CT scans, have identified multiple liver lesions. The MRI was the first to reveal the presence of more than one lesion, as previous CT scans had only shown one small lesion.  She has been experiencing some anxiety about the upcoming procedures and is eager to proceed with the treatments. She has been on Niaspan and experiences hot flashes as a side effect, but finds them manageable.  She has been through chemotherapy and is no longer considered immunocompromised, as it has been more than ten days since her last chemotherapy session. She is planning to meet with a nutritionist to discuss dietary options moving forward and is interested in maintaining an active lifestyle, including exercise, which is confirmed to be safe for her liver condition.    ALLERGIES:  has no known allergies.  MEDICATIONS:  Current Outpatient Medications  Medication Sig Dispense Refill   acetaminophen (TYLENOL) 500 MG tablet Take 500 mg by mouth every 6 (six) hours as needed for mild pain.     Cholecalciferol (VITAMIN D3) 5000 units CAPS Take 5,000 Units by mouth daily.     citalopram (CELEXA) 40 MG tablet Take 1 tablet (40 mg total) by mouth daily. 90 tablet 1   Ibuprofen 200 MG CAPS Take 400 mg by mouth daily as needed (Pain).     lidocaine-prilocaine (EMLA) cream Apply to affected area once 30 g 3   LORazepam (ATIVAN) 0.5 MG tablet Take 1 tablet (0.5 mg total) by mouth 2 (two)  times daily as needed for anxiety. 30 tablet 1   omeprazole (PRILOSEC) 40 MG capsule Take 1 capsule (40 mg total) by mouth 2 (two) times daily. 60 capsule 1   tamoxifen (NOLVADEX) 20 MG tablet Take 1 tablet (20 mg total) by mouth daily. Stop 1 week before surgery 30 tablet 1   No current facility-administered medications for this visit.    PHYSICAL EXAMINATION: ECOG  PERFORMANCE STATUS: 1 - Symptomatic but completely ambulatory  Vitals:   01/03/24 1100  BP: 125/73  Pulse: 78  Resp: 18  Temp: 98.2 F (36.8 C)  SpO2: 100%   Filed Weights   01/03/24 1100  Weight: 143 lb 4.8 oz (65 kg)      LABORATORY DATA:  I have reviewed the data as listed    Latest Ref Rng & Units 12/27/2023    9:30 AM 12/20/2023    8:45 AM 12/12/2023   12:24 PM  CMP  Glucose 70 - 99 mg/dL 604  540  99   BUN 6 - 20 mg/dL 11  10  13    Creatinine 0.44 - 1.00 mg/dL 9.81  1.91  4.78   Sodium 135 - 145 mmol/L 136  137  136   Potassium 3.5 - 5.1 mmol/L 4.1  3.6  3.5   Chloride 98 - 111 mmol/L 104  104  104   CO2 22 - 32 mmol/L 28  29  27    Calcium 8.9 - 10.3 mg/dL 9.0  9.2  9.3   Total Protein 6.5 - 8.1 g/dL 6.2  6.7  6.5   Total Bilirubin 0.0 - 1.2 mg/dL 0.2  0.3  0.3   Alkaline Phos 38 - 126 U/L 68  71  68   AST 15 - 41 U/L 14  14  13    ALT 0 - 44 U/L 11  12  13      Lab Results  Component Value Date   WBC 6.1 12/27/2023   HGB 11.5 (L) 12/27/2023   HCT 34.4 (L) 12/27/2023   MCV 96.1 12/27/2023   PLT 305 12/27/2023   NEUTROABS 4.8 12/27/2023    ASSESSMENT & PLAN:  Malignant neoplasm of upper-inner quadrant of left female breast (HCC) 07/19/2023:Palpable left breast mass for 6 months.  Mammogram and ultrasound revealed large masses.   10:30 position: 4.3 cm with skin thickening and ulceration: Grade 2 IDC with high-grade DCIS ER 95%, PR 95%, Ki67 30%, HER2 2+ by IHC, FISH negative ratio 1.37;  5 o'clock position: 3.3 cm with calcifications spanning 6.7 cm, axilla negative, biopsy: Grade 3 IDC with necrosis ER 95% PR 90% Ki-67 60% HER2 1+ negative   CT CAP 08/03/2023: 2 prominent left breast masses, left axillary lymph nodes, right hepatic lobe lesion concerning for metastatic breast cancer Ultrasound a liver biopsy scheduled for 08/17/2023   Treatment plan: Neoadjuvant chemotherapy with dose Adriamycin and Cytoxan followed by Taxol completed 12/27/2023 Mastectomy  left breast Adjuvant radiation Liver directed therapy (ablation vs Y90) Antiestrogen therapy with CDK inhibitors ---------------------------------------------------------------------------------------------------- MRI liver 12/26/2023: Significantly diminished size of hepatic metastases (0.6 cm, 1.4 cm, (used to be 3.6 cm), 0.7 cm) MRI breast 12/31/2023: Significant positive response to chemo.  Both left malignancies decrease in size (3 cm with enhancement) and 1.1 cm CT CAP 12/26/2023: Significant decrease in breast masses, resolution of left axillary lymph nodes, decrease liver metastases  Return to clinic after surgery to discuss final pathology report.      No orders of the defined types were placed in this  encounter.  The patient has a good understanding of the overall plan. she agrees with it. she will call with any problems that may develop before the next visit here. Total time spent: 30 mins including face to face time and time spent for planning, charting and co-ordination of care   Tamsen Meek, MD 01/03/24

## 2024-01-04 ENCOUNTER — Other Ambulatory Visit: Payer: Self-pay | Admitting: General Surgery

## 2024-01-04 DIAGNOSIS — Z809 Family history of malignant neoplasm, unspecified: Secondary | ICD-10-CM | POA: Diagnosis not present

## 2024-01-04 DIAGNOSIS — C50919 Malignant neoplasm of unspecified site of unspecified female breast: Secondary | ICD-10-CM | POA: Diagnosis not present

## 2024-01-04 DIAGNOSIS — Z170 Estrogen receptor positive status [ER+]: Secondary | ICD-10-CM | POA: Diagnosis not present

## 2024-01-04 DIAGNOSIS — C787 Secondary malignant neoplasm of liver and intrahepatic bile duct: Secondary | ICD-10-CM | POA: Diagnosis not present

## 2024-01-04 DIAGNOSIS — C50112 Malignant neoplasm of central portion of left female breast: Secondary | ICD-10-CM | POA: Diagnosis not present

## 2024-01-05 DIAGNOSIS — Z419 Encounter for procedure for purposes other than remedying health state, unspecified: Secondary | ICD-10-CM | POA: Diagnosis not present

## 2024-01-08 ENCOUNTER — Ambulatory Visit
Admission: RE | Admit: 2024-01-08 | Discharge: 2024-01-08 | Disposition: A | Discharge status: Home / Self Care | Payer: Medicaid Other | Source: Ambulatory Visit | Attending: Hematology and Oncology | Admitting: Hematology and Oncology

## 2024-01-08 DIAGNOSIS — C50912 Malignant neoplasm of unspecified site of left female breast: Secondary | ICD-10-CM | POA: Diagnosis not present

## 2024-01-08 DIAGNOSIS — R160 Hepatomegaly, not elsewhere classified: Secondary | ICD-10-CM

## 2024-01-08 DIAGNOSIS — C787 Secondary malignant neoplasm of liver and intrahepatic bile duct: Secondary | ICD-10-CM | POA: Diagnosis not present

## 2024-01-08 HISTORY — PX: IR RADIOLOGIST EVAL & MGMT: IMG5224

## 2024-01-08 NOTE — Consult Note (Signed)
 Chief Complaint: Patient was seen in consultation today for breast cancer mets to liver at the request of Gudena,Vinay  Referring Physician(s): Gudena,Vinay  History of Present Illness: Ann Daugherty is a 53 y.o. female With a history of left breast cancer stage IV -  (cT4b, cN0, cM1, G3, ER+, PR+, HER2-).  She has lesions in her liver and underwent biopsy of one of the liver lesions in October 2024 confirming metastatic carcinoma from breast primary.  She has recently completed neoadjuvant chemotherapy with Adriamycin and Cytoxan followed by Taxol completed in February 2025.   She is now a candidate for potential liver directed therapy.  The dominant lesion in segment 6 has regressed from 3.6 cm to 1.4 cm and is an excellent candidate for percutaneous microwave ablation.  There is a smaller subcentimeter lesion in the anterior dome which is questionable and warrants close observation.  Currently, she is feeling fairly well although she is anxious and strongly desires to proceed with treatment.  Past Medical History:  Diagnosis Date   ADD (attention deficit disorder) 2015   Does not take any medication   Anxiety    Cancer (HCC)    breast   Depression    GERD (gastroesophageal reflux disease)    History of chicken pox    PONV (postoperative nausea and vomiting)     Past Surgical History:  Procedure Laterality Date   BACK SURGERY     BREAST BIOPSY Left 07/19/2023   [place] LT BREAST BX W LOC DEV EA ADD LESION IMG BX SPEC US GUIDE 07/19/2023 [facility]   BREAST BIOPSY Left 07/19/2023   [place] LT BREAST BX W LOC DEV 1ST LESION IMG BX SPEC US GUIDE 07/19/2023 [facility]   BREAST BIOPSY Right 08/09/2023   [place] RT BREAST BX W LOC DEV 1ST LESION IMG BX SPEC US GUIDE 08/09/2023 [facility]   BREAST BIOPSY Right 08/09/2023   [place] RT BREAST BX W LOC DEV EA ADD LESION IMG BX SPEC US GUIDE 08/09/2023 [facility]   IR RADIOLOGIST EVAL & MGMT  01/08/2024   LAMINECTOMY      2007, 2011   PORTACATH PLACEMENT Right 08/14/2023   Procedure: INSERTION PORT-A-CATH WITH ULTRASOUND Julian Reil;  Surgeon: Almond Lint, MD;  Location: [facility];  Service: General;  Laterality: Right;   VAGINAL DELIVERY  2004, 2006    Allergies: Patient has no known allergies.  Medications: Prior to Admission medications   Medication Sig Start Date End Date Taking? Authorizing Provider  acetaminophen (TYLENOL) 500 MG tablet Take 500 mg by mouth every 6 (six) hours as needed for mild pain.   Yes [provider]  Cholecalciferol (VITAMIN D3) 5000 units CAPS Take 5,000 Units by mouth daily. 12/07/16  Yes [provider]  citalopram (CELEXA) 40 MG tablet Take 1 tablet (40 mg total) by mouth daily. 10/24/23  Yes Causey, Larna Daughters, NP  Ibuprofen 200 MG CAPS Take 400 mg by mouth daily as needed (Pain). 12/07/16  Yes [provider]  lidocaine-prilocaine (EMLA) cream Apply to affected area once 08/10/23  Yes Serena Croissant, MD  LORazepam (ATIVAN) 0.5 MG tablet Take 1 tablet (0.5 mg total) by mouth 2 (two) times daily as needed for anxiety. 08/10/23  Yes Serena Croissant, MD  tamoxifen (NOLVADEX) 20 MG tablet Take 1 tablet (20 mg total) by mouth daily. Stop 1 week before surgery 12/27/23  Yes Serena Croissant, MD  omeprazole (PRILOSEC) 40 MG capsule Take 1 capsule (40 mg total) by mouth 2 (  two) times daily. Patient not taking: Reported on 01/08/2024 10/17/23   Loa Socks, NP     Family History  Problem Relation Age of Onset   Breast cancer Mother 42   Colon cancer Mother 30   Asthma Mother    Hypertension Mother    Hearing loss Mother    Hypertension Father    Diabetes Father    Hyperlipidemia Father    Learning disabilities Son    ADD / ADHD Son    Ovarian cancer Maternal Grandmother    Hypertension Maternal Grandfather    Hyperlipidemia Maternal Grandfather    Heart disease Maternal Grandfather    Stroke Maternal Grandfather    Mental illness Paternal  Grandmother    Hypertension Paternal Grandfather     Social History   Socioeconomic History   Marital status: Divorced    Spouse name: Not on file   Number of children: 2   Years of education: Not on file   Highest education level: Not on file  Occupational History   Not on file  Tobacco Use   Smoking status: Former    Current packs/day: 0.00    Average packs/day: 0.5 packs/day for 30.0 years (15.0 ttl pk-yrs)    Types: Cigarettes    Start date: 04/07/1991    Quit date: 04/06/2021    Years since quitting: 2.7   Smokeless tobacco: Current  Vaping Use   Vaping status: Every Day   Substances: Nicotine  Substance and Sexual Activity   Alcohol use: Yes    Comment: 3-4 drinks a year   Drug use: No   Sexual activity: Not Currently    Birth control/protection: None  Other Topics Concern   Not on file  Social History Narrative   32 and 72 y/o sons   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   Social Drivers of Health   Financial Resource Strain: Not on file  Food Insecurity: Food Insecurity Present (07/27/2023)   Hunger Vital Sign    Worried About Running Out of Food in the Last Year: Sometimes true    Ran Out of Food in the Last Year: Sometimes true  Transportation Needs: No Transportation Needs (07/27/2023)   PRAPARE - Administrator, Civil Service (Medical): No    Lack of Transportation (Non-Medical): No  Physical Activity: Not on file  Stress: Not on file  Social Connections: Not on file    ECOG Status: 0 - Asymptomatic  Review of Systems: A 12 point ROS discussed and pertinent positives are indicated in the HPI above.  All other systems are negative.  Review of Systems  Vital Signs: BP 121/64   Pulse 88   Temp 98.6 F (37 C)   Resp 17   SpO2 96%     Physical Exam Constitutional:      General: She is not in acute distress.    Appearance: Normal appearance. She is normal weight.  HENT:     Head: Normocephalic and atraumatic.  Eyes:     General: No  scleral icterus. Cardiovascular:     Rate and Rhythm: Normal rate.  Pulmonary:     Effort: Pulmonary effort is normal.  Abdominal:     General: There is no distension.     Palpations: Abdomen is soft.     Tenderness: There is no abdominal tenderness. There is no guarding.  Skin:    General: Skin is warm and dry.  Neurological:     Mental Status: She is alert and  oriented to person, place, and time.  Psychiatric:        Mood and Affect: Mood normal.        Behavior: Behavior normal.       Imaging: IR Radiologist Eval & Mgmt Result Date: 01/08/2024 EXAM: NEW PATIENT OFFICE VISIT CHIEF COMPLAINT: SEE NOTE IN EPIC HISTORY OF PRESENT ILLNESS: SEE NOTE IN EPIC REVIEW OF SYSTEMS: SEE NOTE IN EPIC PHYSICAL EXAMINATION: SEE NOTE IN EPIC ASSESSMENT AND PLAN: SEE NOTE IN EPIC Electronically Signed   By: Malachy Moan M.D.   On: 01/08/2024 16:01   MR BREAST BILATERAL W WO CONTRAST INC CAD Result Date: 12/31/2023 CLINICAL DATA:  Assessed response to neoadjuvant chemotherapy. Patient had 2 large left breast masses both positive on biopsy for malignancy, biopsy performed on 07/19/2023. To enhancing right breast a to have phase again masses noted on MRI from 08/02/2023 underwent ultrasound-guided core needle biopsy on 08/09/2023, both benign and concordant. EXAM: BILATERAL BREAST MRI WITH AND WITHOUT CONTRAST TECHNIQUE: Multiplanar, multisequence MR images of both breasts were obtained prior to and following the intravenous administration of 7 ml of Vueway. Three-dimensional MR images were rendered by post-processing of the original MR data on an independent workstation. The three-dimensional MR images were interpreted, and findings are reported in the following complete MRI report for this study. Three dimensional images were evaluated at the independent interpreting workstation using the DynaCAD thin client. COMPARISON:  Prior exams including the prior breast MRI dated 08/02/2023. FINDINGS: Breast  composition: c. Heterogeneous fibroglandular tissue. Background parenchymal enhancement: Mild Right breast: No suspicious mass or significant areas of abnormal enhancement. The previously noted and biopsied mass in the inferior aspect of the breast on the prior MRI is no longer visualized. There is subtle susceptibility artifact from the post biopsy marker clip in this location. There is a 4 mm enhancing mass, hyperintense on T2 imaging, in the upper inner right breast more anteriorly, adjacent to artifact from a post biopsy marker clip, corresponding to the second biopsy-proven benign lesion. Left breast: There has been significant interval improvement following neoadjuvant chemotherapy. Large mass noted in the inferior left breast, near 5 o'clock, is significantly smaller, measuring 2.5 cm in greatest dimension on T1 weighted imaging. There is patchy low level residual enhancement throughout this area spanning approximately 3.0 x 2.9 cm transversely, centered on image 120, series 7. There is a smaller focus of residual abnormal enhancement in the upper inner breast, enhancement spanning a maximum of 1.1 cm anterior to posterior. There is enhancement that extends in a linear fashion from this to the overlying skin, which also demonstrates enhancement, with minimal thickening. This is markedly improved compared to the prior exam. No new areas of abnormal left breast enhancement. No new left breast masses. Lymph nodes: No abnormal appearing lymph nodes. Ancillary findings: Port-A-Cath lies in the anterior right upper chest just above the right breast. Known liver metastatic lesions are not well-defined, but were evaluated with liver MRI on 12/21/2023. IMPRESSION: 1. Significant positive response to neoadjuvant chemotherapy. Both left breast malignancies have notably decreased in size. 2. No evidence of new left breast malignancy. 3. No evidence of right breast malignancy. 4. No evidence of metastatic lymphadenopathy.  RECOMMENDATION: 1. Treatment as planned for the known left breast malignancies. BI-RADS CATEGORY  6: Known biopsy-proven malignancy. Electronically Signed   By: Amie Portland M.D.   On: 12/31/2023 11:23   MR LIVER W WO CONTRAST Result Date: 12/26/2023 CLINICAL DATA:  Metastatic breast cancer, hepatic metastatic disease EXAM:  MRI ABDOMEN WITHOUT AND WITH CONTRAST TECHNIQUE: Multiplanar multisequence MR imaging of the abdomen was performed both before and after the administration of intravenous contrast. CONTRAST:  6mL GADAVIST GADOBUTROL 1 MMOL/ML IV SOLN COMPARISON:  08/01/2023 FINDINGS: Lower chest: No acute abnormality. Hepatobiliary: Significantly diminished size of hepatic metastases, a tiny residua in the anterior liver dome, VIII, measuring 0.6 cm, previously 1.0 x 1.0 cm (series 6, image 9), lesion in the posterior right lobe of the liver, hepatic segment VI, measuring 1.4 x 0.9 cm, previously 3.6 x 3.3 cm (series 6, image 17). Additional tiny metastasis not appreciated by prior CT of medial hepatic segment VII, measuring 0.7 cm (series 6, image 11). No gallstones, gallbladder wall thickening, or biliary dilatation. Pancreas: Unremarkable. No pancreatic ductal dilatation or surrounding inflammatory changes. Spleen: Normal in size without significant abnormality. Adrenals/Urinary Tract: Adrenal glands are unremarkable. Kidneys are normal, without renal calculi, solid lesion, or hydronephrosis. Stomach/Bowel: Stomach is within normal limits. No evidence of bowel wall thickening, distention, or inflammatory changes. Vascular/Lymphatic: No significant vascular findings are present. No enlarged abdominal lymph nodes. Other: No abdominal wall hernia or abnormality. No ascites. Musculoskeletal: No acute or significant osseous findings. IMPRESSION: 1. Significantly diminished size of hepatic metastases in the anterior liver dome and posterior right lobe of the liver. 2. Additional tiny metastasis not appreciated  by prior CT of medial hepatic segment VII, measuring 0.7 cm. 3. No new or progressive metastatic disease in the abdomen. Electronically Signed   By: Jearld Lesch M.D.   On: 12/26/2023 14:51   CT CHEST ABDOMEN PELVIS W CONTRAST Result Date: 12/26/2023 CLINICAL DATA:  Follow-up metastatic left breast carcinoma. Restaging. * Tracking Code: BO * EXAM: CT CHEST, ABDOMEN, AND PELVIS WITH CONTRAST TECHNIQUE: Multidetector CT imaging of the chest, abdomen and pelvis was performed following the standard protocol during bolus administration of intravenous contrast. RADIATION DOSE REDUCTION: This exam was performed according to the departmental dose-optimization program which includes automated exposure control, adjustment of the mA and/or kV according to patient size and/or use of iterative reconstruction technique. CONTRAST:  OMNIPAQUE IOHEXOL 300 MG/ML  SOLN COMPARISON:  08/01/2023 FINDINGS: CT CHEST FINDINGS Cardiovascular: No acute findings. Mediastinum/Lymph Nodes: Previously seen borderline enlarged left axillary lymph nodes have resolved since prior exam. No other sites of lymphadenopathy identified. Lungs/Pleura: No suspicious pulmonary nodules or masses identified. No evidence of infiltrate or pleural effusion. Musculoskeletal: No suspicious bone lesions identified. The 2 soft tissue masses previously seen in the medial and central left breast have significantly decreased in size since previous study. CT ABDOMEN AND PELVIS FINDINGS Hepatobiliary: No masses identified. Pancreas: Hypovascular mass in the posterior right hepatic lobe has significantly decreased in size, currently measuring 1.3 x 1.2 cm on image 62/2, compared to 3.4 x 3.3 cm previously. A few other small low-attenuation lesions previously seen in the liver dome have also decreased since previous study. No new or enlarging liver masses are identified. Gallbladder is unremarkable. No evidence of biliary ductal dilatation. Spleen:  Within normal  limits in size and appearance. Adrenals/Urinary tract: No suspicious masses or hydronephrosis. Stomach/Bowel: No evidence of obstruction, inflammatory process, or abnormal fluid collections. Vascular/Lymphatic: No pathologically enlarged lymph nodes identified. No acute vascular findings. Reproductive:  No mass or other significant abnormality identified. Other:  None. Musculoskeletal:  No suspicious bone lesions identified. IMPRESSION: Significant decrease in size of left breast masses. Resolution of previously seen borderline enlarged left axillary lymph nodes. Decreased hepatic metastatic disease. No new or progressive disease identified. Electronically Signed  By: Danae Orleans M.D.   On: 12/26/2023 14:02    Labs:  CBC: Recent Labs    12/07/23 1004 12/12/23 1224 12/20/23 0845 12/27/23 0909  WBC 4.0 5.6 4.3 6.1  HGB 12.0 11.2* 12.0 11.5*  HCT 35.4* 33.4* 35.6* 34.4*  PLT 322 281 344 305    COAGS: Recent Labs    08/17/23 1115  INR 1.0    BMP: Recent Labs    12/07/23 1004 12/12/23 1224 12/20/23 0845 12/27/23 0930  NA 135 136 137 136  K 3.5 3.5 3.6 4.1  CL 101 104 104 104  CO2 26 27 29 28   GLUCOSE 107* 99 130* 100*  BUN 11 13 10 11   CALCIUM 9.3 9.3 9.2 9.0  CREATININE 0.66 0.55 0.66 0.60  GFRNONAA >60 >60 >60 >60    LIVER FUNCTION TESTS: Recent Labs    12/07/23 1004 12/12/23 1224 12/20/23 0845 12/27/23 0930  BILITOT 0.5 0.3 0.3 0.2  AST 29 13* 14* 14*  ALT 30 13 12 11   ALKPHOS 73 68 71 68  PROT 6.9 6.5 6.7 6.2*  ALBUMIN 4.3 4.1 4.1 4.0    TUMOR MARKERS: No results for input(s): "AFPTM", "CEA", "CA199", "CHROMGRNA" in the last 8760 hours.  Assessment and Plan:  Very pleasant 53 year old female with breast cancer metastatic to the liver.  She has achieved an excellent response to initial chemotherapy but still has at least 1 lesion visible on MRI.  She is interested in pursuing aggressive treatment in order to maintain local control and hopefully achieve  cure.  Of the 2 lesions on the MRI, 1 is large enough to approach for percutaneous ablation.  The second lesion anterior and under the dome of the liver is more questionable in nature and would be extremely difficult to reach for ablation.  This lesion is so small that observation is warranted.  We discussed the risks, benefits and alternatives to percutaneous ablation of the lesion in segment 6.  Mrs. Canoy voiced her understanding and I was able to answer all of her questions.  She desires to proceed as soon as possible.  She is currently scheduled for bilateral mastectomies on March 20.  She is hopeful that we may be able to schedule her ablation procedure prior to that.  1.)  Please schedule for percutaneous microwave ablation to be performed by me at [facility].  Patient is hopeful to be scheduled prior to March 20.  Thank you for this interesting consult.  I greatly enjoyed meeting BRIONY PARVEEN and look forward to participating in their care.  A copy of this report was sent to the requesting provider on this date.  Electronically Signed: Sterling Big 01/08/2024, 4:34 PM   I spent a total of 40 Minutes  in face to face in clinical consultation, greater than 50% of which was counseling/coordinating care for breast cancer metastatic to the liver

## 2024-01-09 ENCOUNTER — Other Ambulatory Visit: Payer: Self-pay | Admitting: Interventional Radiology

## 2024-01-09 DIAGNOSIS — R160 Hepatomegaly, not elsewhere classified: Secondary | ICD-10-CM

## 2024-01-11 ENCOUNTER — Other Ambulatory Visit: Payer: Self-pay

## 2024-01-11 ENCOUNTER — Encounter
Admission: RE | Admit: 2024-01-11 | Discharge: 2024-01-11 | Disposition: A | Discharge status: Home / Self Care | Source: Ambulatory Visit | Attending: Interventional Radiology | Admitting: Interventional Radiology

## 2024-01-11 NOTE — Patient Instructions (Signed)
 Your procedure is scheduled on:  To find out your arrival time, please call [phone] between 1PM - 3PM on:    Report to the Registration Desk on the 1st floor of the [facility]. Valet parking is available.  If your arrival time is 6:00 am, do not arrive before that time as the [facility] entrance doors do not open until 6:00 am.  REMEMBER: Instructions that are not followed completely may result in serious medical risk, up to and including death; or upon the discretion of your surgeon and anesthesiologist your surgery may need to be rescheduled.  Do not eat food after midnight the night before surgery.  No gum chewing or hard candies.  You may however, drink CLEAR liquids up to 2 hours before you are scheduled to arrive for your surgery. Do not drink anything within 2 hours of your scheduled arrival time.  Clear liquids include: - water  - apple juice without pulp - gatorade (not RED colors) - black coffee or tea (Do NOT add milk or creamers to the coffee or tea) Do NOT drink anything that is not on this list.  Type 1 and Type 2 diabetics should only drink water.  In addition, your doctor has ordered for you to drink the provided:  Ensure Pre-Surgery Clear Carbohydrate Drink  Gatorade G2 Drinking this carbohydrate drink up to two hours before surgery helps to reduce insulin resistance and improve patient outcomes. Please complete drinking 2 hours before scheduled arrival time.  One week prior to surgery: Stop Anti-inflammatories (NSAIDS) such as Advil, Aleve, Ibuprofen, Motrin, Naproxen, Naprosyn and Aspirin based products such as Excedrin, Goody's Powder, BC Powder. You may however, continue to take Tylenol if needed for pain up until the day of surgery.  Stop ANY OVER THE COUNTER supplements until after surgery.  Continue taking all prescribed medications with the exception of the following:  **Follow guidelines for insulin and diabetes medications**  Follow  recommendations from Cardiologist or PCP regarding stopping blood thinners.  TAKE ONLY THESE MEDICATIONS THE MORNING OF SURGERY WITH A SIP OF WATER:    Antacid (take one the night before and one on the morning of surgery - helps to prevent nausea after surgery.)  Use inhalers on the day of surgery and bring to the hospital.  Fleets enema or bowel prep as directed.  No Alcohol for 24 hours before or after surgery.  No Smoking including e-cigarettes for 24 hours before surgery.  No chewable tobacco products for at least 6 hours before surgery.  No nicotine patches on the day of surgery.  Do not use any "recreational" drugs for at least a week (preferably 2 weeks) before your surgery.  Please be advised that the combination of cocaine and anesthesia may have negative outcomes, up to and including death. If you test positive for cocaine, your surgery will be cancelled.  On the morning of surgery brush your teeth with toothpaste and water, you may rinse your mouth with mouthwash if you wish. Do not swallow any toothpaste or mouthwash.  Use CHG Soap or wipes as directed on instruction sheet.  Do not wear lotions, powders, or perfumes.   Do not shave body hair from the neck down 48 hours before surgery.  Wear comfortable clothing (specific to your surgery type) to the hospital.  Do not wear jewelry, make-up, hairpins, clips or nail polish.  For welded (permanent) jewelry: bracelets, anklets, waist bands, etc.  Please have this removed prior to surgery.  If it is  not removed, there is a chance that hospital personnel will need to cut it off on the day of surgery. Contact lenses, hearing aids and dentures may not be worn into surgery.  Bring your C-PAP to the hospital in case you may have to spend the night.   Do not bring valuables to the hospital. [facility] is not responsible for any missing/lost belongings or valuables.   Total Shoulder Arthroplasty:  use Benzoyl Peroxide 5% Gel  as directed on instruction sheet.  Notify your doctor if there is any change in your medical condition (cold, fever, infection).  If you are being discharged the day of surgery, you will not be allowed to drive home. You will need a responsible individual to drive you home and stay with you for 24 hours after surgery.   If you are taking public transportation, you will need to have a responsible individual with you.  If you are being admitted to the hospital overnight, leave your suitcase in the car. After surgery it may be brought to your room.  In case of increased patient census, it may be necessary for you, the patient, to continue your postoperative care in the Same Day Surgery department.  After surgery, you can help prevent lung complications by doing breathing exercises.  Take deep breaths and cough every 1-2 hours. Your doctor may order a device called an Incentive Spirometer to help you take deep breaths. When coughing or sneezing, hold a pillow firmly against your incision with both hands. This is called "splinting." Doing this helps protect your incision. It also decreases belly discomfort.  Surgery Visitation Policy:  Patients undergoing a surgery or procedure may have two family members or support persons with them as long as the person is not COVID-19 positive or experiencing its symptoms.   Inpatient Visitation:    Visiting hours are 7 a.m. to 8 p.m. Up to four visitors are allowed at one time in a patient room. The visitors may rotate out with other people during the day. One designated support person (adult) may remain overnight.  Due to an increase in RSV and influenza rates and associated hospitalizations, children ages 77 and under will not be able to visit patients in [facility]. Masks continue to be strongly recommended.  Please call the Pre-admissions Testing Dept. at [phone] if you have any questions about these instructions.

## 2024-01-15 ENCOUNTER — Other Ambulatory Visit (HOSPITAL_COMMUNITY): Payer: Self-pay | Admitting: Student

## 2024-01-15 ENCOUNTER — Telehealth: Payer: Self-pay | Admitting: Hematology and Oncology

## 2024-01-15 ENCOUNTER — Encounter: Payer: Self-pay | Admitting: *Deleted

## 2024-01-15 DIAGNOSIS — R160 Hepatomegaly, not elsewhere classified: Secondary | ICD-10-CM

## 2024-01-15 NOTE — Progress Notes (Signed)
 Patient for CT Liver MWA on Wed 01/16/24, I called and spoke with the patient on the phone and gave pre-procedure instructions. Pt was made aware to be here at 7:30a, NPO after MN prior to procedure as well as driver post procedure/recovery/discharge. Pt stated understanding.  Called 01/15/24  Pre-admit testing appt was Friday 01/11/24

## 2024-01-16 ENCOUNTER — Encounter: Payer: Self-pay | Admitting: Anesthesiology

## 2024-01-16 ENCOUNTER — Ambulatory Visit
Admission: RE | Admit: 2024-01-16 | Discharge: 2024-01-16 | Disposition: A | Discharge status: Home / Self Care | Source: Ambulatory Visit | Attending: Interventional Radiology | Admitting: Interventional Radiology

## 2024-01-16 ENCOUNTER — Other Ambulatory Visit (HOSPITAL_COMMUNITY): Payer: Self-pay

## 2024-01-16 ENCOUNTER — Other Ambulatory Visit: Payer: Self-pay

## 2024-01-16 DIAGNOSIS — Z79899 Other long term (current) drug therapy: Secondary | ICD-10-CM | POA: Insufficient documentation

## 2024-01-16 DIAGNOSIS — J9690 Respiratory failure, unspecified, unspecified whether with hypoxia or hypercapnia: Secondary | ICD-10-CM | POA: Diagnosis not present

## 2024-01-16 DIAGNOSIS — R160 Hepatomegaly, not elsewhere classified: Secondary | ICD-10-CM

## 2024-01-16 DIAGNOSIS — C787 Secondary malignant neoplasm of liver and intrahepatic bile duct: Secondary | ICD-10-CM | POA: Diagnosis not present

## 2024-01-16 DIAGNOSIS — Z9221 Personal history of antineoplastic chemotherapy: Secondary | ICD-10-CM | POA: Diagnosis not present

## 2024-01-16 DIAGNOSIS — C50512 Malignant neoplasm of lower-outer quadrant of left female breast: Secondary | ICD-10-CM | POA: Insufficient documentation

## 2024-01-16 DIAGNOSIS — K219 Gastro-esophageal reflux disease without esophagitis: Secondary | ICD-10-CM | POA: Insufficient documentation

## 2024-01-16 DIAGNOSIS — F419 Anxiety disorder, unspecified: Secondary | ICD-10-CM | POA: Diagnosis not present

## 2024-01-16 DIAGNOSIS — F17290 Nicotine dependence, other tobacco product, uncomplicated: Secondary | ICD-10-CM | POA: Diagnosis not present

## 2024-01-16 DIAGNOSIS — Z803 Family history of malignant neoplasm of breast: Secondary | ICD-10-CM | POA: Insufficient documentation

## 2024-01-16 DIAGNOSIS — Z800 Family history of malignant neoplasm of digestive organs: Secondary | ICD-10-CM | POA: Insufficient documentation

## 2024-01-16 LAB — CBC WITH DIFFERENTIAL/PLATELET
Abs Immature Granulocytes: 0.02 10*3/uL (ref 0.00–0.07)
Basophils Absolute: 0 10*3/uL (ref 0.0–0.1)
Basophils Relative: 1 %
Eosinophils Absolute: 0 10*3/uL (ref 0.0–0.5)
Eosinophils Relative: 1 %
HCT: 35 % — ABNORMAL LOW (ref 36.0–46.0)
Hemoglobin: 11.9 g/dL — ABNORMAL LOW (ref 12.0–15.0)
Immature Granulocytes: 1 %
Lymphocytes Relative: 21 %
Lymphs Abs: 0.9 10*3/uL (ref 0.7–4.0)
MCH: 31.8 pg (ref 26.0–34.0)
MCHC: 34 g/dL (ref 30.0–36.0)
MCV: 93.6 fL (ref 80.0–100.0)
Monocytes Absolute: 0.5 10*3/uL (ref 0.1–1.0)
Monocytes Relative: 12 %
Neutro Abs: 2.6 10*3/uL (ref 1.7–7.7)
Neutrophils Relative %: 64 %
Platelets: 241 10*3/uL (ref 150–400)
RBC: 3.74 MIL/uL — ABNORMAL LOW (ref 3.87–5.11)
RDW: 12.4 % (ref 11.5–15.5)
WBC: 4 10*3/uL (ref 4.0–10.5)
nRBC: 0 % (ref 0.0–0.2)

## 2024-01-16 LAB — COMPREHENSIVE METABOLIC PANEL
ALT: 18 U/L (ref 0–44)
AST: 21 U/L (ref 15–41)
Albumin: 3.7 g/dL (ref 3.5–5.0)
Alkaline Phosphatase: 56 U/L (ref 38–126)
Anion gap: 7 (ref 5–15)
BUN: 14 mg/dL (ref 6–20)
CO2: 27 mmol/L (ref 22–32)
Calcium: 9 mg/dL (ref 8.9–10.3)
Chloride: 104 mmol/L (ref 98–111)
Creatinine, Ser: 0.62 mg/dL (ref 0.44–1.00)
GFR, Estimated: >60 mL/min (ref >60)
Glucose, Bld: 106 mg/dL — ABNORMAL HIGH (ref 70–99)
Potassium: 3.8 mmol/L (ref 3.5–5.1)
Sodium: 138 mmol/L (ref 135–145)
Total Bilirubin: 0.5 mg/dL (ref 0.0–1.2)
Total Protein: 6.6 g/dL (ref 6.5–8.1)

## 2024-01-16 LAB — PROTIME-INR
INR: 1 (ref 0.8–1.2)
Prothrombin Time: 13.8 s (ref 11.4–15.2)

## 2024-01-16 LAB — TYPE AND SCREEN
ABO/RH(D): O POS
Antibody Screen: NEGATIVE

## 2024-01-16 MED ORDER — ONDANSETRON HCL 4 MG/2ML IJ SOLN
4.0000 mg | Freq: Four times a day (QID) | INTRAMUSCULAR | Status: DC | PRN
  Filled 2024-01-16: qty 2

## 2024-01-16 MED ORDER — OXYCODONE-ACETAMINOPHEN 5-325 MG PO TABS
1.0000 | ORAL_TABLET | ORAL | 0 refills | Status: DC | PRN
Start: 2024-01-16 — End: 2024-01-25
  Filled 2024-01-16: qty 25, 5d supply, fill #0

## 2024-01-16 MED ORDER — OXYCODONE HCL 5 MG PO TABS
5.0000 mg | ORAL_TABLET | Freq: Once | ORAL | Status: DC | PRN
  Filled 2024-01-16: qty 1

## 2024-01-16 MED ORDER — LIDOCAINE HCL (CARDIAC) PF 100 MG/5ML IV SOSY
PREFILLED_SYRINGE | INTRAVENOUS | Status: DC | PRN
  Administered 2024-01-16: 60 mg via INTRAVENOUS

## 2024-01-16 MED ORDER — CHLORHEXIDINE GLUCONATE CLOTH 2 % EX PADS: 6.0000 | MEDICATED_PAD | Freq: Once | CUTANEOUS | Status: DC

## 2024-01-16 MED ORDER — PROPOFOL 500 MG/50ML IV EMUL
INTRAVENOUS | Status: DC | PRN
  Administered 2024-01-16: 120 mg via INTRAVENOUS
  Administered 2024-01-16: 100 ug/kg/min via INTRAVENOUS

## 2024-01-16 MED ORDER — DEXMEDETOMIDINE HCL IN NACL 80 MCG/20ML IV SOLN
INTRAVENOUS | Status: DC | PRN
  Administered 2024-01-16 (×3): 4 ug via INTRAVENOUS

## 2024-01-16 MED ORDER — FENTANYL CITRATE PF 50 MCG/ML IJ SOSY
25.0000 ug | PREFILLED_SYRINGE | INTRAMUSCULAR | Status: DC | PRN
  Filled 2024-01-16: qty 1

## 2024-01-16 MED ORDER — OXYCODONE HCL 5 MG/5ML PO SOLN
5.0000 mg | Freq: Once | ORAL | Status: DC | PRN
  Filled 2024-01-16: qty 5

## 2024-01-16 MED ORDER — ONDANSETRON HCL 4 MG/2ML IJ SOLN
INTRAMUSCULAR | Status: DC | PRN
  Administered 2024-01-16 (×2): 4 mg via INTRAVENOUS

## 2024-01-16 MED ORDER — PHENYLEPHRINE 80 MCG/ML (10ML) SYRINGE FOR IV PUSH (FOR BLOOD PRESSURE SUPPORT)
PREFILLED_SYRINGE | INTRAVENOUS | Status: DC | PRN
  Administered 2024-01-16: 80 ug via INTRAVENOUS

## 2024-01-16 MED ORDER — KETOROLAC TROMETHAMINE 60 MG/2ML IM SOLN
INTRAMUSCULAR | Status: DC | PRN
  Administered 2024-01-16: 60 mg via INTRAMUSCULAR

## 2024-01-16 MED ORDER — DEXAMETHASONE SODIUM PHOSPHATE 10 MG/ML IJ SOLN
INTRAMUSCULAR | Status: DC | PRN
  Administered 2024-01-16: 10 mg via INTRAVENOUS

## 2024-01-16 MED ORDER — CHLORHEXIDINE GLUCONATE CLOTH 2 % EX PADS: 6.0000 | MEDICATED_PAD | Freq: Every day | CUTANEOUS | Status: DC

## 2024-01-16 MED ORDER — FENTANYL CITRATE (PF) 100 MCG/2ML IJ SOLN
INTRAMUSCULAR | Status: DC | PRN
  Administered 2024-01-16 (×2): 50 ug via INTRAVENOUS

## 2024-01-16 MED ORDER — GABAPENTIN 300 MG PO CAPS
300.0000 mg | ORAL_CAPSULE | ORAL | Status: DC
  Filled 2024-01-16: qty 1

## 2024-01-16 MED ORDER — ROCURONIUM BROMIDE 100 MG/10ML IV SOLN
INTRAVENOUS | Status: DC | PRN
  Administered 2024-01-16: 40 mg via INTRAVENOUS
  Administered 2024-01-16 (×2): 20 mg via INTRAVENOUS

## 2024-01-16 MED ORDER — ONDANSETRON HCL 4 MG/2ML IJ SOLN
4.0000 mg | Freq: Once | INTRAMUSCULAR | Status: DC | PRN
  Filled 2024-01-16: qty 2

## 2024-01-16 MED ORDER — SODIUM CHLORIDE 0.9 % IV SOLN: INTRAVENOUS | Status: DC

## 2024-01-16 MED ORDER — CELECOXIB 400 MG PO CAPS
400.0000 mg | ORAL_CAPSULE | ORAL | Status: DC
  Filled 2024-01-16: qty 1

## 2024-01-16 MED ORDER — HEPARIN SOD (PORK) LOCK FLUSH 100 UNIT/ML IV SOLN
500.0000 [IU] | Freq: Once | INTRAVENOUS | Status: AC
  Administered 2024-01-16: 500 [IU] via INTRAVENOUS
  Filled 2024-01-16 (×2): qty 5

## 2024-01-16 MED ORDER — ACETAMINOPHEN 500 MG PO TABS
1000.0000 mg | ORAL_TABLET | ORAL | Status: DC
Start: 2024-01-16 — End: 2024-01-17
  Filled 2024-01-16: qty 2

## 2024-01-16 MED ORDER — CEFAZOLIN SODIUM-DEXTROSE 2-4 GM/100ML-% IV SOLN
2.0000 g | INTRAVENOUS | Status: DC
  Filled 2024-01-16: qty 100

## 2024-01-16 MED ORDER — HYDROCODONE-ACETAMINOPHEN 5-325 MG PO TABS
1.0000 | ORAL_TABLET | ORAL | Status: DC | PRN
  Administered 2024-01-16: 1 via ORAL
  Filled 2024-01-16: qty 1
  Filled 2024-01-16 (×2): qty 2

## 2024-01-16 MED ORDER — SODIUM CHLORIDE 0.9 % IV SOLN
2.0000 g | INTRAVENOUS | Status: AC
  Administered 2024-01-16: 2 g via INTRAVENOUS
  Filled 2024-01-16: qty 2

## 2024-01-16 MED ORDER — ACETAMINOPHEN 10 MG/ML IV SOLN
1000.0000 mg | Freq: Once | INTRAVENOUS | Status: DC | PRN
  Administered 2024-01-16: 1000 mg via INTRAVENOUS
  Filled 2024-01-16 (×2): qty 100

## 2024-01-16 MED ORDER — MIDAZOLAM HCL 2 MG/2ML IJ SOLN
INTRAMUSCULAR | Status: DC | PRN
  Administered 2024-01-16: 2 mg via INTRAVENOUS

## 2024-01-16 MED ORDER — SUGAMMADEX SODIUM 200 MG/2ML IV SOLN
INTRAVENOUS | Status: DC | PRN
  Administered 2024-01-16: 130 mg via INTRAVENOUS

## 2024-01-16 MED ORDER — EPHEDRINE SULFATE-NACL 50-0.9 MG/10ML-% IV SOSY
PREFILLED_SYRINGE | INTRAVENOUS | Status: DC | PRN
  Administered 2024-01-16: 5 mg via INTRAVENOUS
  Administered 2024-01-16: 10 mg via INTRAVENOUS

## 2024-01-16 NOTE — Anesthesia Preprocedure Evaluation (Addendum)
 Anesthesia Evaluation  Patient identified by MRN, date of birth, ID band Patient awake    Reviewed: Allergy & Precautions, NPO status , Patient's Chart, lab work & pertinent test results  History of Anesthesia Complications (+) PONV and history of anesthetic complications  Airway Mallampati: II  TM Distance: >3 FB Neck ROM: Full    Dental no notable dental hx. (+) Teeth Intact   Pulmonary neg pulmonary ROS, neg sleep apnea, neg COPD, Patient abstained from smoking.Not current smoker, former smoker   Pulmonary exam normal breath sounds clear to auscultation       Cardiovascular Exercise Tolerance: Good METS(-) hypertension(-) CAD and (-) Past MI negative cardio ROS (-) dysrhythmias  Rhythm:Regular Rate:Normal - Systolic murmurs    Neuro/Psych  PSYCHIATRIC DISORDERS Anxiety Depression    negative neurological ROS     GI/Hepatic ,GERD  Controlled,,(+)     (-) substance abuse    Endo/Other  neg diabetes    Renal/GU negative Renal ROS     Musculoskeletal   Abdominal   Peds  Hematology   Anesthesia Other Findings Past Medical History: 2015: ADD (attention deficit disorder)     Comment:  Does not take any medication No date: Anxiety No date: Cancer [facility])     Comment:  breast No date: Depression No date: GERD (gastroesophageal reflux disease) No date: History of chicken pox No date: PONV (postoperative nausea and vomiting)  Reproductive/Obstetrics                             Anesthesia Physical Anesthesia Plan  ASA: 2  Anesthesia Plan: General   Post-op Pain Management: Ofirmev IV (intra-op)*   Induction: Intravenous  PONV Risk Score and Plan: 4 or greater and Ondansetron, Dexamethasone, Midazolam, TIVA and Propofol infusion  Airway Management Planned: Oral ETT and Video Laryngoscope Planned  Additional Equipment: None  Intra-op Plan:   Post-operative Plan: Extubation in  OR  Informed Consent: I have reviewed the patients History and Physical, chart, labs and discussed the procedure including the risks, benefits and alternatives for the proposed anesthesia with the patient or authorized representative who has indicated his/her understanding and acceptance.     Dental advisory given  Plan Discussed with: CRNA and Surgeon  Anesthesia Plan Comments: (Discussed risks of anesthesia with patient, including PONV, sore throat, lip/dental/eye damage. Rare risks discussed as well, such as cardiorespiratory and neurological sequelae, and allergic reactions. Discussed the role of CRNA in patient's perioperative care. Patient understands.)       Anesthesia Quick Evaluation

## 2024-01-16 NOTE — Procedures (Signed)
 Interventional Radiology Procedure Note  Procedure: CT guided MWA of breast cancer met in liver  Complications: None  Estimated Blood Loss: None  Recommendations: - Bedrest x 4 hrs - DC home   Signed,  Sterling Big, MD

## 2024-01-16 NOTE — Anesthesia Postprocedure Evaluation (Signed)
 Anesthesia Post Note  Patient: Ann Daugherty  Procedure(s) Performed: IR RADIOLOGIST EVAL & MGMT  Patient location during evaluation: PACU Anesthesia Type: General Level of consciousness: awake and alert Pain management: pain level controlled Vital Signs Assessment: post-procedure vital signs reviewed and stable Respiratory status: spontaneous breathing, nonlabored ventilation, respiratory function stable and patient connected to nasal cannula oxygen Cardiovascular status: blood pressure returned to baseline and stable Postop Assessment: no apparent nausea or vomiting Anesthetic complications: no   No notable events documented.   Last Vitals:  Vitals:   01/08/24 1520  BP: 121/64  Pulse: 88  Resp: 17  Temp: 37 C  SpO2: 96%    Last Pain:  Vitals:   01/08/24 1520  PainSc: 0-No pain                 Corinda Gubler

## 2024-01-16 NOTE — Transfer of Care (Signed)
 Immediate Anesthesia Transfer of Care Note  Patient: Ann Daugherty  Procedure(s) Performed: IR RADIOLOGIST EVAL & MGMT  Patient Location: PACU  Anesthesia Type:General  Level of Consciousness: drowsy and patient cooperative  Airway & Oxygen Therapy: Patient Spontanous Breathing  Post-op Assessment: Report given to RN and Post -op Vital signs reviewed and stable  Post vital signs: Reviewed and stable  Last Vitals:  Vitals Value Taken Time  BP 113/58 01/16/24 1121  Temp 36.4 C 01/16/24 1114  Pulse 79 01/16/24 1122  Resp 24 01/16/24 1122  SpO2 97 % 01/16/24 1122  Vitals shown include unfiled device data.  Last Pain:  Vitals:   01/16/24 1114  TempSrc:   PainSc: 0-No pain         Complications: No notable events documented.

## 2024-01-16 NOTE — H&P (Signed)
 Chief Complaint: Breast cancer metastasis to liver - image guided percutaneous microwave liver lesion ablation   Referring Provider(s): Serena Croissant   Supervising Physician: Malachy Moan  Patient Status: [facility] - Out-pt  History of Present Illness: Ann Daugherty is a 53 y.o. female with history of anxiety, GERD, post operative nausea and vomiting, and left breast cancer stave IV.  Liver lesions were found to be present, a biopsy from 08/2023 confirmed metastatic carcinoma to the liver from the primary breast cancer.  She is s/p completed 12/2023 neoadjuvant chemotherapy adriamycin and cytoxan followed by taxol.  She has seen massive regression of the dominant liver lesion in segment 6 from 3.6 cm to 1.4 cm.  She was referred to interventional radiology for possible microwave ablation of the lesion.  She met with Dr. Archer Asa on 01/08/2024 in consultation for possible image guided percutaneous microwave liver lesion ablation and discussed the procedure as well as its benefits and risks.  She subsequently decided to move forward with the treatment and was scheduled for image guided percutaneous microwave liver lesion ablation with Dr. Archer Asa 01/16/24.     Patient is Full Code  Past Medical History:  Diagnosis Date   ADD (attention deficit disorder) 2015   Does not take any medication   Anxiety    Cancer (HCC)    breast   Depression    GERD (gastroesophageal reflux disease)    History of chicken pox    PONV (postoperative nausea and vomiting)     Past Surgical History:  Procedure Laterality Date   BACK SURGERY     BREAST BIOPSY Left 07/19/2023   [place] LT BREAST BX W LOC DEV EA ADD LESION IMG BX SPEC US GUIDE 07/19/2023 [facility]   BREAST BIOPSY Left 07/19/2023   [place] LT BREAST BX W LOC DEV 1ST LESION IMG BX SPEC US GUIDE 07/19/2023 [facility]   BREAST BIOPSY Right 08/09/2023   [place] RT BREAST BX W LOC DEV 1ST LESION IMG BX SPEC US GUIDE 08/09/2023 [facility]   BREAST BIOPSY Right 08/09/2023   [place] RT BREAST BX W LOC DEV EA ADD LESION IMG BX SPEC US GUIDE 08/09/2023 [facility]   IR RADIOLOGIST EVAL & MGMT  01/08/2024   LAMINECTOMY     2007, 2011   PORTACATH PLACEMENT Right 08/14/2023   Procedure: INSERTION PORT-A-CATH WITH ULTRASOUND Julian Reil;  Surgeon: Almond Lint, MD;  Location: [facility] OR;  Service: General;  Laterality: Right;   VAGINAL DELIVERY  2004, 2006    Allergies: Patient has no known allergies.  Medications: Prior to Admission medications   Medication Sig Start Date End Date Taking? Authorizing Provider  acetaminophen (TYLENOL) 500 MG tablet Take 500 mg by mouth every 6 (six) hours as needed for mild pain.    [provider]  Cholecalciferol (VITAMIN D3) 5000 units CAPS Take 5,000 Units by mouth daily. 12/07/16   [provider]  citalopram (CELEXA) 40 MG tablet Take 1 tablet (40 mg total) by mouth daily. 10/24/23   Loa Socks, NP  Ibuprofen 200 MG CAPS Take 400 mg by mouth daily as needed (Pain). 12/07/16   [provider]  lidocaine-prilocaine (EMLA) cream Apply to affected area once 08/10/23   Serena Croissant, MD  LORazepam (ATIVAN) 0.5 MG tablet Take 1 tablet (0.5 mg total) by mouth 2 (two) times daily as needed for anxiety. 08/10/23   Serena Croissant, MD  omeprazole (PRILOSEC) 40 MG capsule Take 1 capsule (40 mg total) by  mouth 2 (two) times daily. Patient not taking: Reported on 01/08/2024 10/17/23   Loa Socks, NP  tamoxifen (NOLVADEX) 20 MG tablet Take 1 tablet (20 mg total) by mouth daily. Stop 1 week before surgery 12/27/23   Serena Croissant, MD     Family History  Problem Relation Age of Onset   Breast cancer Mother 83   Colon cancer Mother 15   Asthma Mother    Hypertension Mother    Hearing loss Mother    Hypertension Father    Diabetes Father    Hyperlipidemia Father    Learning disabilities Son    ADD / ADHD Son    Ovarian cancer Maternal Grandmother     Hypertension Maternal Grandfather    Hyperlipidemia Maternal Grandfather    Heart disease Maternal Grandfather    Stroke Maternal Grandfather    Mental illness Paternal Grandmother    Hypertension Paternal Grandfather     Social History   Socioeconomic History   Marital status: Divorced    Spouse name: Not on file   Number of children: 2   Years of education: Not on file   Highest education level: Not on file  Occupational History   Not on file  Tobacco Use   Smoking status: Former    Current packs/day: 0.00    Average packs/day: 0.5 packs/day for 30.0 years (15.0 ttl pk-yrs)    Types: Cigarettes    Start date: 04/07/1991    Quit date: 04/06/2021    Years since quitting: 2.7   Smokeless tobacco: Current  Vaping Use   Vaping status: Every Day   Substances: Nicotine  Substance and Sexual Activity   Alcohol use: Yes    Comment: 3-4 drinks a year   Drug use: No   Sexual activity: Not Currently    Birth control/protection: None  Other Topics Concern   Not on file  Social History Narrative   26 and 78 y/o sons   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   Social Drivers of Health   Financial Resource Strain: Not on file  Food Insecurity: Food Insecurity Present (07/27/2023)   Hunger Vital Sign    Worried About Running Out of Food in the Last Year: Sometimes true    Ran Out of Food in the Last Year: Sometimes true  Transportation Needs: No Transportation Needs (07/27/2023)   PRAPARE - Administrator, Civil Service (Medical): No    Lack of Transportation (Non-Medical): No  Physical Activity: Not on file  Stress: Not on file  Social Connections: Not on file     Review of Systems: A 12 point ROS discussed and pertinent positives are indicated in the HPI above.  All other systems are negative.  Review of Systems  Constitutional:  Negative for chills, fatigue and fever.  Respiratory:  Negative for cough, shortness of breath and wheezing.   Gastrointestinal:  Negative  for diarrhea, nausea and vomiting.  Neurological:  Negative for dizziness and headaches.  Psychiatric/Behavioral:  Negative for agitation, behavioral problems and confusion.     Vital Signs: BP 107/60   Pulse 74   Resp 20   Wt 135 lb 3.2 oz (61.3 kg)   LMP 10/07/2023 (Approximate) Comment: started chemo in October 2024  SpO2 97%   BMI 21.82 kg/m   Advance Care Plan: The advanced care place/surrogate decision maker was discussed at the time of visit and the patient did not wish to discuss or was not able to name a surrogate decision  maker or provide an advance care plan.  Physical Exam Constitutional:      Appearance: She is well-developed.  HENT:     Head: Atraumatic.     Mouth/Throat:     Mouth: Mucous membranes are moist.  Cardiovascular:     Rate and Rhythm: Normal rate and regular rhythm.     Heart sounds: No murmur heard. Pulmonary:     Effort: Pulmonary effort is normal.     Breath sounds: Normal breath sounds.  Abdominal:     General: Bowel sounds are normal.     Palpations: Abdomen is soft.  Musculoskeletal:        General: Normal range of motion.  Skin:    General: Skin is warm.  Neurological:     Mental Status: She is alert and oriented to person, place, and time.  Psychiatric:        Mood and Affect: Mood normal.        Behavior: Behavior normal.     Imaging: IR Radiologist Eval & Mgmt Result Date: 01/08/2024 EXAM: NEW PATIENT OFFICE VISIT CHIEF COMPLAINT: SEE NOTE IN EPIC HISTORY OF PRESENT ILLNESS: SEE NOTE IN EPIC REVIEW OF SYSTEMS: SEE NOTE IN EPIC PHYSICAL EXAMINATION: SEE NOTE IN EPIC ASSESSMENT AND PLAN: SEE NOTE IN EPIC Electronically Signed   By: Malachy Moan M.D.   On: 01/08/2024 16:01   MR BREAST BILATERAL W WO CONTRAST INC CAD Result Date: 12/31/2023 CLINICAL DATA:  Assessed response to neoadjuvant chemotherapy. Patient had 2 large left breast masses both positive on biopsy for malignancy, biopsy performed on 07/19/2023. To enhancing right  breast a to have phase again masses noted on MRI from 08/02/2023 underwent ultrasound-guided core needle biopsy on 08/09/2023, both benign and concordant. EXAM: BILATERAL BREAST MRI WITH AND WITHOUT CONTRAST TECHNIQUE: Multiplanar, multisequence MR images of both breasts were obtained prior to and following the intravenous administration of 7 ml of Vueway. Three-dimensional MR images were rendered by post-processing of the original MR data on an independent workstation. The three-dimensional MR images were interpreted, and findings are reported in the following complete MRI report for this study. Three dimensional images were evaluated at the independent interpreting workstation using the DynaCAD thin client. COMPARISON:  Prior exams including the prior breast MRI dated 08/02/2023. FINDINGS: Breast composition: c. Heterogeneous fibroglandular tissue. Background parenchymal enhancement: Mild Right breast: No suspicious mass or significant areas of abnormal enhancement. The previously noted and biopsied mass in the inferior aspect of the breast on the prior MRI is no longer visualized. There is subtle susceptibility artifact from the post biopsy marker clip in this location. There is a 4 mm enhancing mass, hyperintense on T2 imaging, in the upper inner right breast more anteriorly, adjacent to artifact from a post biopsy marker clip, corresponding to the second biopsy-proven benign lesion. Left breast: There has been significant interval improvement following neoadjuvant chemotherapy. Large mass noted in the inferior left breast, near 5 o'clock, is significantly smaller, measuring 2.5 cm in greatest dimension on T1 weighted imaging. There is patchy low level residual enhancement throughout this area spanning approximately 3.0 x 2.9 cm transversely, centered on image 120, series 7. There is a smaller focus of residual abnormal enhancement in the upper inner breast, enhancement spanning a maximum of 1.1 cm anterior to  posterior. There is enhancement that extends in a linear fashion from this to the overlying skin, which also demonstrates enhancement, with minimal thickening. This is markedly improved compared to the prior exam. No new areas of  abnormal left breast enhancement. No new left breast masses. Lymph nodes: No abnormal appearing lymph nodes. Ancillary findings: Port-A-Cath lies in the anterior right upper chest just above the right breast. Known liver metastatic lesions are not well-defined, but were evaluated with liver MRI on 12/21/2023. IMPRESSION: 1. Significant positive response to neoadjuvant chemotherapy. Both left breast malignancies have notably decreased in size. 2. No evidence of new left breast malignancy. 3. No evidence of right breast malignancy. 4. No evidence of metastatic lymphadenopathy. RECOMMENDATION: 1. Treatment as planned for the known left breast malignancies. BI-RADS CATEGORY  6: Known biopsy-proven malignancy. Electronically Signed   By: Amie Portland M.D.   On: 12/31/2023 11:23   MR LIVER W WO CONTRAST Result Date: 12/26/2023 CLINICAL DATA:  Metastatic breast cancer, hepatic metastatic disease EXAM: MRI ABDOMEN WITHOUT AND WITH CONTRAST TECHNIQUE: Multiplanar multisequence MR imaging of the abdomen was performed both before and after the administration of intravenous contrast. CONTRAST:  6mL GADAVIST GADOBUTROL 1 MMOL/ML IV SOLN COMPARISON:  08/01/2023 FINDINGS: Lower chest: No acute abnormality. Hepatobiliary: Significantly diminished size of hepatic metastases, a tiny residua in the anterior liver dome, VIII, measuring 0.6 cm, previously 1.0 x 1.0 cm (series 6, image 9), lesion in the posterior right lobe of the liver, hepatic segment VI, measuring 1.4 x 0.9 cm, previously 3.6 x 3.3 cm (series 6, image 17). Additional tiny metastasis not appreciated by prior CT of medial hepatic segment VII, measuring 0.7 cm (series 6, image 11). No gallstones, gallbladder wall thickening, or biliary  dilatation. Pancreas: Unremarkable. No pancreatic ductal dilatation or surrounding inflammatory changes. Spleen: Normal in size without significant abnormality. Adrenals/Urinary Tract: Adrenal glands are unremarkable. Kidneys are normal, without renal calculi, solid lesion, or hydronephrosis. Stomach/Bowel: Stomach is within normal limits. No evidence of bowel wall thickening, distention, or inflammatory changes. Vascular/Lymphatic: No significant vascular findings are present. No enlarged abdominal lymph nodes. Other: No abdominal wall hernia or abnormality. No ascites. Musculoskeletal: No acute or significant osseous findings. IMPRESSION: 1. Significantly diminished size of hepatic metastases in the anterior liver dome and posterior right lobe of the liver. 2. Additional tiny metastasis not appreciated by prior CT of medial hepatic segment VII, measuring 0.7 cm. 3. No new or progressive metastatic disease in the abdomen. Electronically Signed   By: Jearld Lesch M.D.   On: 12/26/2023 14:51   CT CHEST ABDOMEN PELVIS W CONTRAST Result Date: 12/26/2023 CLINICAL DATA:  Follow-up metastatic left breast carcinoma. Restaging. * Tracking Code: BO * EXAM: CT CHEST, ABDOMEN, AND PELVIS WITH CONTRAST TECHNIQUE: Multidetector CT imaging of the chest, abdomen and pelvis was performed following the standard protocol during bolus administration of intravenous contrast. RADIATION DOSE REDUCTION: This exam was performed according to the departmental dose-optimization program which includes automated exposure control, adjustment of the mA and/or kV according to patient size and/or use of iterative reconstruction technique. CONTRAST:  OMNIPAQUE IOHEXOL 300 MG/ML  SOLN COMPARISON:  08/01/2023 FINDINGS: CT CHEST FINDINGS Cardiovascular: No acute findings. Mediastinum/Lymph Nodes: Previously seen borderline enlarged left axillary lymph nodes have resolved since prior exam. No other sites of lymphadenopathy identified.  Lungs/Pleura: No suspicious pulmonary nodules or masses identified. No evidence of infiltrate or pleural effusion. Musculoskeletal: No suspicious bone lesions identified. The 2 soft tissue masses previously seen in the medial and central left breast have significantly decreased in size since previous study. CT ABDOMEN AND PELVIS FINDINGS Hepatobiliary: No masses identified. Pancreas: Hypovascular mass in the posterior right hepatic lobe has significantly decreased in size, currently measuring 1.3  x 1.2 cm on image 62/2, compared to 3.4 x 3.3 cm previously. A few other small low-attenuation lesions previously seen in the liver dome have also decreased since previous study. No new or enlarging liver masses are identified. Gallbladder is unremarkable. No evidence of biliary ductal dilatation. Spleen:  Within normal limits in size and appearance. Adrenals/Urinary tract: No suspicious masses or hydronephrosis. Stomach/Bowel: No evidence of obstruction, inflammatory process, or abnormal fluid collections. Vascular/Lymphatic: No pathologically enlarged lymph nodes identified. No acute vascular findings. Reproductive:  No mass or other significant abnormality identified. Other:  None. Musculoskeletal:  No suspicious bone lesions identified. IMPRESSION: Significant decrease in size of left breast masses. Resolution of previously seen borderline enlarged left axillary lymph nodes. Decreased hepatic metastatic disease. No new or progressive disease identified. Electronically Signed   By: Danae Orleans M.D.   On: 12/26/2023 14:02    Labs:  CBC: Recent Labs    12/07/23 1004 12/12/23 1224 12/20/23 0845 12/27/23 0909  WBC 4.0 5.6 4.3 6.1  HGB 12.0 11.2* 12.0 11.5*  HCT 35.4* 33.4* 35.6* 34.4*  PLT 322 281 344 305    COAGS: Recent Labs    08/17/23 1115  INR 1.0    BMP: Recent Labs    12/07/23 1004 12/12/23 1224 12/20/23 0845 12/27/23 0930  NA 135 136 137 136  K 3.5 3.5 3.6 4.1  CL 101 104 104 104   CO2 26 27 29 28   GLUCOSE 107* 99 130* 100*  BUN 11 13 10 11   CALCIUM 9.3 9.3 9.2 9.0  CREATININE 0.66 0.55 0.66 0.60  GFRNONAA >60 >60 >60 >60    LIVER FUNCTION TESTS: Recent Labs    12/07/23 1004 12/12/23 1224 12/20/23 0845 12/27/23 0930  BILITOT 0.5 0.3 0.3 0.2  AST 29 13* 14* 14*  ALT 30 13 12 11   ALKPHOS 73 68 71 68  PROT 6.9 6.5 6.7 6.2*  ALBUMIN 4.3 4.1 4.1 4.0    TUMOR MARKERS: No results for input(s): "AFPTM", "CEA", "CA199", "CHROMGRNA" in the last 8760 hours.  Assessment and Plan:  Pt with breast cancer metastasis to liver scheduled for image guided percutaneous microwave liver lesion ablation with Dr. Archer Asa 01/16/24.    Risks and benefits discussed with the patient including, but not limited to bleeding, infection, liver failure, bile duct injury, pneumothorax or damage to adjacent structures.  All of the patient's questions were answered, patient is agreeable to proceed. Consent signed and in chart.  Thank you for allowing our service to participate in MAYSOON LOZADA 's care.  Electronically Signed: Loman Brooklyn, PA-C   01/16/2024, 8:15 AM    I spent a total of    15 Minutes in face to face in clinical consultation, greater than 50% of which was counseling/coordinating care for image guided percutaneous microwave liver lesion ablation.

## 2024-01-16 NOTE — Anesthesia Procedure Notes (Signed)
 Procedure Name: Intubation Date/Time: 01/16/2024 9:25 AM  Performed by: Omer Jack, CRNAPre-anesthesia Checklist: Patient identified, Patient being monitored, Timeout performed, Emergency Drugs available and Suction available Patient Re-evaluated:Patient Re-evaluated prior to induction Oxygen Delivery Method: Circle system utilized Preoxygenation: Pre-oxygenation with 100% oxygen Induction Type: IV induction Ventilation: Mask ventilation without difficulty Laryngoscope Size: 3 and McGrath Grade View: Grade II Tube type: Oral Tube size: 6.5 mm Number of attempts: 1 Airway Equipment and Method: Stylet Placement Confirmation: ETT inserted through vocal cords under direct vision, positive ETCO2 and breath sounds checked- equal and bilateral Secured at: 21 cm Tube secured with: Tape Dental Injury: Teeth and Oropharynx as per pre-operative assessment

## 2024-01-16 NOTE — Progress Notes (Signed)
 Dr. Archer Asa in at bedside to speak with pt. Post procedue. Pt. Verbalized understanding of conversation with MD.

## 2024-01-17 ENCOUNTER — Encounter (HOSPITAL_BASED_OUTPATIENT_CLINIC_OR_DEPARTMENT_OTHER): Payer: Self-pay | Admitting: General Surgery

## 2024-01-21 MED ORDER — CHLORHEXIDINE GLUCONATE CLOTH 2 % EX PADS: 6.0000 | MEDICATED_PAD | Freq: Once | CUTANEOUS | Status: DC

## 2024-01-21 NOTE — Progress Notes (Signed)

## 2024-01-23 NOTE — H&P (Signed)
 PROVIDER: Matthias Hughs, MD Patient Care Team: Adair Laundry, PA as PCP - General (Family Medicine) Matthias Hughs, MD as Consulting Provider (Surgical Oncology) Sabas Sous, MD (Hematology and Oncology) Buckner Malta, MD (Radiation Oncology)  MRN: [id number] DOB: 1970/12/04 DATE OF ENCOUNTER: 01/04/2024 Chief Complaint: Wound Check (discuss breast surgery)   History of Present Illness: Ann Daugherty is a 53 y.o. female who is seen today for breast cancer.  Initial history:    Patient presented with new diagnosis of left breast cancer 07/2023. She presented with two palpable masses with one of them having skin ulceration over the top. They have been present for around 6 months, but only recently became this big with the skin change. Diagnostic imaging showed a 3.8 cm mass at 5 o'clock with around 6.7 cm of calcifications and a 4 cm mass at 10:30 o'clock. The 10:30 o'clock mass and around a 5 cm area of ulcerated skin. Axilla was negative. Core needle biopsies were performed. The 5 o'clock mass had grade 3 invasive ductal carcinoma that is ER/PR positive, her 2 negative, and Ki 67 60%. The 10:30 mass was grade 2 invasive ductal carcinoma, Er/PR +, her 2 negative and Ki 67 30%.    Family cancer history - mother is my [name], with colon and bilateral breast cancer. Maternal grandmother had ovarian cancer.   Interval history:   After seeing the patient, she completed staging studies. She was found to have a liver metastasis. Complete staging is stage IIIb with clinical T4b clinical N0 p.m. 1, grade 3 invasive ductal carcinoma ER and PR positive HER2 negative. She has been receiving AC plus T for chemotherapy. She has not had any bony mets or any other site of disease.  History of Present Illness The patient, with a history of cancer, presents for a discussion about an upcoming bilateral mastectomy. The patient has been dealing with  metastatic disease and has shown improvement since the last visit. The patient has concerns about the recovery process, potential complications, and the aesthetic outcome of the surgery. The patient also expresses a desire to minimize the appearance of "dog ears" post-surgery. The patient is also planning a short trip before the surgery and has concerns about the timing and potential risks. The patient has been on tamoxifen for a week and plans to continue it. The patient also has a port which she plans to keep post-surgery. The patient has concerns about physical therapy and restrictions on movement post-surgery. The patient also expresses a desire to apply for disability and plans for future travel.  Follow up breast MRI 12/30/23 IMPRESSION: 1. Significant positive response to neoadjuvant chemotherapy. Both left breast malignancies have notably decreased in size. 2. No evidence of new left breast malignancy. 3. No evidence of right breast malignancy. 4. No evidence of metastatic lymphadenopathy. RECOMMENDATION: 1. Treatment as planned for the known left breast malignancies. BI-RADS CATEGORY 6: Known biopsy-proven malignancy.  CT c/a/p 12/24/23 IMPRESSION: Significant decrease in size of left breast masses. Resolution of previously seen borderline enlarged left axillary lymph nodes. Decreased hepatic metastatic disease. No new or progressive disease identified.  MR liver 12/21/23 IMPRESSION: 1. Significantly diminished size of hepatic metastases in the anterior liver dome and posterior right lobe of the liver. 2. Additional tiny metastasis not appreciated by prior CT of medial hepatic segment VII, measuring 0.7 cm. 3. No new or progressive metastatic disease in the abdomen.  08/02/23 IMPRESSION: 1. Two indeterminate enhancing masses in the RIGHT  breast. A mass in the LOWER OUTER QUADRANT at middle depth measures approximately 0.8 cm. A mass in the UPPER INNER QUADRANT measures  approximately 0.6 cm. 2. Large enhancing mass measuring approximately 6 cm in the LOWER OUTER QUADRANT of the LEFT breast at anterior middle depth, biopsy-proven IDC and DCIS. This mass abuts the skin surface, though there is no abnormal enhancement in the overlying skin to confirm skin involvement. 3. Large enhancing mass measuring approximately 6 cm in the UPPER INNER QUADRANT of the LEFT breast at middle to posterior depth, biopsy-proven IDC. There is extensive skin involvement associated with this mass. 4. Superficial 0.7 cm mass in the LOWER INNER QUADRANT of the LEFT breast, likely a satellite malignant lesion. 5. Enhancing 0.7 cm mass in the axillary tail of the LEFT breast, likely an abnormal intramammary lymph node which was not present on prior mammograms. 6. No pathologic lymphadenopathy elsewhere. RECOMMENDATION: Ultrasound-guided core needle biopsy of the 2 indeterminate RIGHT breast masses. BI-RADS CATEGORY 4: Suspicious.   Review of Systems: A complete review of systems was obtained from the patient. I have reviewed this information and discussed as appropriate with the patient. See HPI as well for other ROS.  Review of Systems  All other systems reviewed and are negative.   Medical History: Past Medical History:  Diagnosis Date  Anxiety   Patient Active Problem List  Diagnosis  Malignant neoplasm of central portion of left breast in female, estrogen receptor positive (CMS/HHS-HCC)  Family history of cancer  Breast cancer metastasized to liver, unspecified laterality (CMS/HHS-HCC)   Past Surgical History:  Procedure Laterality Date  L4-L5 Laminectomy 2007  L4-L5 Laminectomy 2011    No Known Allergies  Current Outpatient Medications on File Prior to Visit  Medication Sig Dispense Refill  acetaminophen (TYLENOL) 500 MG tablet Take 500 mg by mouth every 6 (six) hours as needed  calcium carbonate (TUMS) 200 mg calcium (500 mg) chewable tablet Take by  mouth  citalopram (CELEXA) 40 MG tablet Take 40 mg by mouth once daily  LORazepam (ATIVAN) 0.5 MG tablet Take 0.5 mg by mouth 2 (two) times daily as needed  tamoxifen (NOLVADEX) 20 MG tablet Take 20 mg by mouth   No current facility-administered medications on file prior to visit.   Family History  Problem Relation Age of Onset  Obesity Mother  High blood pressure (Hypertension) Mother  Colon cancer Mother  Breast cancer Mother  High blood pressure (Hypertension) Father  Hyperlipidemia (Elevated cholesterol) Father  Coronary Artery Disease (Blocked arteries around heart) Father    Social History   Tobacco Use  Smoking Status Former  Types: Cigarettes  Smokeless Tobacco Never    Social History   Socioeconomic History  Marital status: Divorced  Tobacco Use  Smoking status: Former  Types: Cigarettes  Smokeless tobacco: Never  Vaping Use  Vaping status: Every Day  Substance and Sexual Activity  Alcohol use: Yes  Alcohol/week: 2.0 - 3.0 standard drinks of alcohol  Types: 2 - 3 Standard drinks or equivalent per week  Drug use: Never   Social Drivers of Health   Food Insecurity: Food Insecurity Present (07/27/2023)  Received from [facility]  Hunger Vital Sign  Worried About Running Out of Food in the Last Year: Sometimes true  Ran Out of Food in the Last Year: Sometimes true  Transportation Needs: No Transportation Needs (07/27/2023)  Received from [facility] - Transportation  Lack of Transportation (Medical): No  Lack of Transportation (Non-Medical): No   Objective:  There were no vitals filed for this visit.  There is no height or weight on file to calculate BMI.  Head: Normocephalic and atraumatic.  Eyes: Conjunctivae are normal. Pupils are equal, round, and reactive to light. No scleral icterus.  Neck: Normal range of motion. Neck supple. No tracheal deviation present. No thyromegaly present.  Resp: No respiratory distress, normal effort. Breast:  Cancer going to the skin has resolved. There is scar tissue there, there is still some palpable mass just inferior and at the lower portion of the scar tissue in the skin, but this is dramatically improved compared to previous. There is no palpable adenopathy. Abd: Abdomen is soft, non distended and non tender. No masses are palpable. There is no rebound and no guarding.  Neurological: Alert and oriented to person, place, and time. Coordination normal.  Skin: Skin is warm and dry. No rash noted. No diaphoretic. No erythema. No pallor.  Psychiatric: Normal mood and affect. Normal behavior. Judgment and thought content normal.   Labs, Imaging and Diagnostic Testing:  12/27/23 CMET essentially normal CBC with HCT 34.4 and normal WBCs and plts  Assessment and Plan:   Diagnoses and all orders for this visit:  Malignant neoplasm of central portion of left breast in female, estrogen receptor positive (CMS/HHS-HCC)  Family history of cancer  Breast cancer metastasized to liver, unspecified laterality (CMS/HHS-HCC)   Assessment & Plan Breast Cancer MRI shows improvement. Discussed mastectomy with incision possibly extending into scar tissue. No evidence of nodal disease in the face , so no sentinel node biopsy planned. -Plan bilateral mastectomy. -Consult with Dr. Leta Baptist regarding possible assistance with marking for surgical incisions.  -Stop Tamoxifen one week prior to surgery.  Liver Metastasis Discussed liver-directed therapy with Dr. Pamelia Hoit. -Consult with Interventional Radiology (IR) for liver targeted therapy  Postoperative Pain Management Discussed potential for pain and muscle tightness post-mastectomy. -Prescribe muscle relaxants, gabapentin, and narcotics. -Advise use of ibuprofen or Aleve, and limit Tylenol to 3000mg /day. -Consider Celebrex for a couple of weeks postoperatively. -We will refer to physical therapy after her drains are removed.  Postoperative  Care Discussed drain care, showering, and activity restrictions. -Advise short showers every 2-3 days, changing dressing if it gets wet. -Advise no heavy lifting over a gallon of milk for about two weeks. -Advise gentle walks and shoulder stretches, aiming for 90 degrees of shoulder movement by two weeks postoperatively. -Advise no swimming pool or bathtub until two weeks after drains come out.  Potential Hysterectomy Discussed potential for hysterectomy and bilateral salpingo-oophorectomy for risk reduction as well as due to risk of ovarian cancer (family history). There is also risk of uterine cancer with tamoxifen.  -Advised patient toconsult with Dr. Rosemary Holms regarding possible TAH/BSO  Follow-up Plan to schedule surgery within the next few weeks. -Check in postoperatively and after liver-directed therapy. -Plan for physical therapy after drains come out. -Plan for radiation therapy after mastectomy and liver-directed therapy. -Plan for Verzenio therapy after radiation.  No follow-ups on file.  Matthias Hughs, MD

## 2024-01-23 NOTE — Anesthesia Preprocedure Evaluation (Signed)
 Anesthesia Evaluation  Patient identified by MRN, date of birth, ID band Patient awake    Reviewed: Allergy & Precautions, NPO status , Patient's Chart, lab work & pertinent test results  History of Anesthesia Complications (+) PONV and history of anesthetic complications  Airway Mallampati: II  TM Distance: >3 FB Neck ROM: Full    Dental no notable dental hx. (+) Teeth Intact, Dental Advisory Given, Implants   Pulmonary Patient abstained from smoking., former smoker Quit smoking 2022, 15 pack year history    Pulmonary exam normal breath sounds clear to auscultation       Cardiovascular (-) hypertension(-) angina (-) Past MI negative cardio ROS Normal cardiovascular exam Rhythm:Regular Rate:Normal     Neuro/Psych  PSYCHIATRIC DISORDERS Anxiety Depression    negative neurological ROS     GI/Hepatic Neg liver ROS,GERD  Controlled,,  Endo/Other  negative endocrine ROS    Renal/GU negative Renal ROS  negative genitourinary   Musculoskeletal negative musculoskeletal ROS (+)    Abdominal   Peds  Hematology negative hematology ROS (+) Lab Results      Component                Value               Date                      WBC                      4.0                 01/16/2024                HGB                      11.9 (L)            01/16/2024                HCT                      35.0 (L)            01/16/2024                MCV                      93.6                01/16/2024                PLT                      241                 01/16/2024              Anesthesia Other Findings L breast ca  Reproductive/Obstetrics negative OB ROS                             Anesthesia Physical Anesthesia Plan  ASA: 2  Anesthesia Plan: Regional and General   Post-op Pain Management: Regional block*, Ketamine IV* and Ofirmev IV (intra-op)*   Induction: Intravenous  PONV Risk Score and  Plan: 4 or greater and Treatment may vary due to age or medical condition, Midazolam, Ondansetron, Dexamethasone and Scopolamine patch -  Pre-op  Airway Management Planned: Oral ETT  Additional Equipment: None  Intra-op Plan:   Post-operative Plan: Extubation in OR  Informed Consent: I have reviewed the patients History and Physical, chart, labs and discussed the procedure including the risks, benefits and alternatives for the proposed anesthesia with the patient or authorized representative who has indicated his/her understanding and acceptance.     Dental advisory given  Plan Discussed with: CRNA  Anesthesia Plan Comments: (GA w Bilateral Pec Blocks)       Anesthesia Quick Evaluation

## 2024-01-24 ENCOUNTER — Other Ambulatory Visit (HOSPITAL_COMMUNITY): Payer: Self-pay

## 2024-01-24 ENCOUNTER — Encounter: Payer: Self-pay | Admitting: Hematology and Oncology

## 2024-01-24 ENCOUNTER — Encounter (HOSPITAL_BASED_OUTPATIENT_CLINIC_OR_DEPARTMENT_OTHER): Payer: Self-pay | Admitting: General Surgery

## 2024-01-24 ENCOUNTER — Encounter (HOSPITAL_BASED_OUTPATIENT_CLINIC_OR_DEPARTMENT_OTHER)
Admission: RE | Disposition: A | Discharge status: Home / Self Care | Payer: Self-pay | Source: Home / Self Care | Attending: General Surgery

## 2024-01-24 ENCOUNTER — Other Ambulatory Visit: Payer: Self-pay

## 2024-01-24 ENCOUNTER — Ambulatory Visit (HOSPITAL_BASED_OUTPATIENT_CLINIC_OR_DEPARTMENT_OTHER)
Admission: RE | Admit: 2024-01-24 | Discharge: 2024-01-25 | Disposition: A | Discharge status: Home / Self Care | Payer: Medicaid Other | Attending: General Surgery | Admitting: General Surgery

## 2024-01-24 ENCOUNTER — Ambulatory Visit (HOSPITAL_BASED_OUTPATIENT_CLINIC_OR_DEPARTMENT_OTHER): Payer: Self-pay | Admitting: Anesthesiology

## 2024-01-24 ENCOUNTER — Ambulatory Visit (HOSPITAL_BASED_OUTPATIENT_CLINIC_OR_DEPARTMENT_OTHER): Payer: Self-pay | Admitting: Urgent Care

## 2024-01-24 DIAGNOSIS — Z1721 Progesterone receptor positive status: Secondary | ICD-10-CM | POA: Insufficient documentation

## 2024-01-24 DIAGNOSIS — Z923 Personal history of irradiation: Secondary | ICD-10-CM

## 2024-01-24 DIAGNOSIS — N641 Fat necrosis of breast: Secondary | ICD-10-CM | POA: Diagnosis not present

## 2024-01-24 DIAGNOSIS — R921 Mammographic calcification found on diagnostic imaging of breast: Secondary | ICD-10-CM | POA: Diagnosis not present

## 2024-01-24 DIAGNOSIS — Z803 Family history of malignant neoplasm of breast: Secondary | ICD-10-CM | POA: Diagnosis not present

## 2024-01-24 DIAGNOSIS — K219 Gastro-esophageal reflux disease without esophagitis: Secondary | ICD-10-CM | POA: Diagnosis not present

## 2024-01-24 DIAGNOSIS — Z800 Family history of malignant neoplasm of digestive organs: Secondary | ICD-10-CM | POA: Insufficient documentation

## 2024-01-24 DIAGNOSIS — N6021 Fibroadenosis of right breast: Secondary | ICD-10-CM | POA: Insufficient documentation

## 2024-01-24 DIAGNOSIS — Z1732 Human epidermal growth factor receptor 2 negative status: Secondary | ICD-10-CM | POA: Insufficient documentation

## 2024-01-24 DIAGNOSIS — C787 Secondary malignant neoplasm of liver and intrahepatic bile duct: Secondary | ICD-10-CM | POA: Insufficient documentation

## 2024-01-24 DIAGNOSIS — Z170 Estrogen receptor positive status [ER+]: Secondary | ICD-10-CM | POA: Diagnosis not present

## 2024-01-24 DIAGNOSIS — G8918 Other acute postprocedural pain: Secondary | ICD-10-CM | POA: Diagnosis not present

## 2024-01-24 DIAGNOSIS — F419 Anxiety disorder, unspecified: Secondary | ICD-10-CM | POA: Diagnosis not present

## 2024-01-24 DIAGNOSIS — Z87891 Personal history of nicotine dependence: Secondary | ICD-10-CM | POA: Diagnosis not present

## 2024-01-24 DIAGNOSIS — Z8041 Family history of malignant neoplasm of ovary: Secondary | ICD-10-CM | POA: Diagnosis not present

## 2024-01-24 DIAGNOSIS — C50912 Malignant neoplasm of unspecified site of left female breast: Secondary | ICD-10-CM | POA: Diagnosis not present

## 2024-01-24 DIAGNOSIS — C50212 Malignant neoplasm of upper-inner quadrant of left female breast: Secondary | ICD-10-CM | POA: Diagnosis present

## 2024-01-24 DIAGNOSIS — F32A Depression, unspecified: Secondary | ICD-10-CM | POA: Insufficient documentation

## 2024-01-24 DIAGNOSIS — N6011 Diffuse cystic mastopathy of right breast: Secondary | ICD-10-CM | POA: Diagnosis not present

## 2024-01-24 DIAGNOSIS — Z79810 Long term (current) use of selective estrogen receptor modulators (SERMs): Secondary | ICD-10-CM | POA: Insufficient documentation

## 2024-01-24 HISTORY — DX: Personal history of irradiation: Z92.3

## 2024-01-24 HISTORY — PX: SIMPLE MASTECTOMY WITH AXILLARY SENTINEL NODE BIOPSY: SHX6098

## 2024-01-24 LAB — POCT PREGNANCY, URINE: Preg Test, Ur: NEGATIVE

## 2024-01-24 SURGERY — SIMPLE MASTECTOMY
Anesthesia: Regional | Site: Breast | Laterality: Bilateral

## 2024-01-24 MED ORDER — AMISULPRIDE (ANTIEMETIC) 5 MG/2ML IV SOLN: 10.0000 mg | Freq: Once | INTRAVENOUS | Status: DC | PRN

## 2024-01-24 MED ORDER — ONDANSETRON HCL 4 MG/2ML IJ SOLN: 4.0000 mg | Freq: Four times a day (QID) | INTRAMUSCULAR | Status: DC | PRN

## 2024-01-24 MED ORDER — KCL IN DEXTROSE-NACL 20-5-0.45 MEQ/L-%-% IV SOLN
INTRAVENOUS | Status: DC
  Filled 2024-01-24: qty 1000

## 2024-01-24 MED ORDER — GABAPENTIN 300 MG PO CAPS
300.0000 mg | ORAL_CAPSULE | ORAL | Status: AC
  Administered 2024-01-24: 300 mg via ORAL

## 2024-01-24 MED ORDER — SENNA 8.6 MG PO TABS
1.0000 | ORAL_TABLET | Freq: Two times a day (BID) | ORAL | 0 refills | Status: DC
  Filled 2024-01-24: qty 100, 50d supply, fill #0
  Filled 2024-04-09: qty 20, 10d supply, fill #1

## 2024-01-24 MED ORDER — LIDOCAINE 2% (20 MG/ML) 5 ML SYRINGE
INTRAMUSCULAR | Status: AC
Start: 2024-01-24 — End: ?
  Filled 2024-01-24: qty 5

## 2024-01-24 MED ORDER — DIPHENHYDRAMINE HCL 50 MG/ML IJ SOLN: 25.0000 mg | Freq: Four times a day (QID) | INTRAMUSCULAR | Status: DC | PRN

## 2024-01-24 MED ORDER — PHENYLEPHRINE HCL-NACL 20-0.9 MG/250ML-% IV SOLN: INTRAVENOUS | Status: DC | PRN

## 2024-01-24 MED ORDER — CEFAZOLIN SODIUM-DEXTROSE 2-4 GM/100ML-% IV SOLN
2.0000 g | Freq: Three times a day (TID) | INTRAVENOUS | Status: AC
  Administered 2024-01-24: 2 g via INTRAVENOUS
  Filled 2024-01-24: qty 100

## 2024-01-24 MED ORDER — ONDANSETRON HCL 4 MG/2ML IJ SOLN: 4.0000 mg | Freq: Once | INTRAMUSCULAR | Status: DC | PRN

## 2024-01-24 MED ORDER — PROPOFOL 10 MG/ML IV BOLUS
INTRAVENOUS | Status: DC | PRN
  Administered 2024-01-24: 120 mg via INTRAVENOUS

## 2024-01-24 MED ORDER — CELECOXIB 200 MG PO CAPS
ORAL_CAPSULE | ORAL | Status: AC
  Filled 2024-01-24: qty 2

## 2024-01-24 MED ORDER — TRAMADOL HCL 50 MG PO TABS: 50.0000 mg | ORAL_TABLET | Freq: Four times a day (QID) | ORAL | Status: DC | PRN

## 2024-01-24 MED ORDER — FENTANYL CITRATE (PF) 100 MCG/2ML IJ SOLN
INTRAMUSCULAR | Status: AC
  Filled 2024-01-24: qty 2

## 2024-01-24 MED ORDER — 0.9 % SODIUM CHLORIDE (POUR BTL) OPTIME
TOPICAL | Status: DC | PRN
  Administered 2024-01-24 (×2): 1000 mL

## 2024-01-24 MED ORDER — DEXAMETHASONE SODIUM PHOSPHATE 10 MG/ML IJ SOLN
INTRAMUSCULAR | Status: AC
  Filled 2024-01-24: qty 1

## 2024-01-24 MED ORDER — DEXMEDETOMIDINE HCL IN NACL 80 MCG/20ML IV SOLN
INTRAVENOUS | Status: AC
  Filled 2024-01-24: qty 20

## 2024-01-24 MED ORDER — OXYCODONE HCL 5 MG PO TABS
5.0000 mg | ORAL_TABLET | Freq: Once | ORAL | Status: AC | PRN
  Administered 2024-01-24: 5 mg via ORAL

## 2024-01-24 MED ORDER — CHLORHEXIDINE GLUCONATE 0.12 % MT SOLN: 15.0000 mL | Freq: Once | OROMUCOSAL | Status: DC

## 2024-01-24 MED ORDER — ROCURONIUM BROMIDE 100 MG/10ML IV SOLN
INTRAVENOUS | Status: DC | PRN
  Administered 2024-01-24: 50 mg via INTRAVENOUS

## 2024-01-24 MED ORDER — LIDOCAINE-EPINEPHRINE 1 %-1:100000 IJ SOLN
INTRAMUSCULAR | Status: AC
  Filled 2024-01-24: qty 1

## 2024-01-24 MED ORDER — FENTANYL CITRATE (PF) 100 MCG/2ML IJ SOLN
INTRAMUSCULAR | Status: DC | PRN
  Administered 2024-01-24: 50 ug via INTRAVENOUS
  Administered 2024-01-24 (×2): 25 ug via INTRAVENOUS

## 2024-01-24 MED ORDER — LACTATED RINGERS IV SOLN: INTRAVENOUS | Status: DC

## 2024-01-24 MED ORDER — LIDOCAINE HCL (PF) 1 % IJ SOLN
INTRAMUSCULAR | Status: AC
Start: 2024-01-24 — End: ?
  Filled 2024-01-24: qty 60

## 2024-01-24 MED ORDER — MIDAZOLAM HCL 2 MG/2ML IJ SOLN
INTRAMUSCULAR | Status: AC
  Filled 2024-01-24: qty 2

## 2024-01-24 MED ORDER — ONDANSETRON 4 MG PO TBDP
4.0000 mg | ORAL_TABLET | Freq: Four times a day (QID) | ORAL | Status: DC | PRN
Start: 2024-01-24 — End: 2024-01-25

## 2024-01-24 MED ORDER — BUPIVACAINE LIPOSOME 1.3 % IJ SUSP
INTRAMUSCULAR | Status: DC | PRN
  Administered 2024-01-24 (×2): 10 mL

## 2024-01-24 MED ORDER — OXYCODONE HCL 5 MG PO TABS
5.0000 mg | ORAL_TABLET | ORAL | Status: DC | PRN
  Administered 2024-01-24 – 2024-01-25 (×2): 5 mg via ORAL
  Filled 2024-01-24 (×3): qty 1

## 2024-01-24 MED ORDER — HYDROMORPHONE HCL 1 MG/ML IJ SOLN: 0.2500 mg | INTRAMUSCULAR | Status: DC | PRN

## 2024-01-24 MED ORDER — DEXMEDETOMIDINE HCL IN NACL 80 MCG/20ML IV SOLN
INTRAVENOUS | Status: DC | PRN
Start: 2024-01-24 — End: 2024-01-24
  Administered 2024-01-24: 8 ug via INTRAVENOUS

## 2024-01-24 MED ORDER — KETAMINE HCL 50 MG/5ML IJ SOSY
PREFILLED_SYRINGE | INTRAMUSCULAR | Status: AC
  Filled 2024-01-24: qty 5

## 2024-01-24 MED ORDER — CELECOXIB 200 MG PO CAPS
400.0000 mg | ORAL_CAPSULE | ORAL | Status: AC
  Administered 2024-01-24: 400 mg via ORAL

## 2024-01-24 MED ORDER — GABAPENTIN 300 MG PO CAPS
ORAL_CAPSULE | ORAL | Status: AC
  Filled 2024-01-24: qty 1

## 2024-01-24 MED ORDER — LORAZEPAM 1 MG PO TABS: 0.5000 mg | ORAL_TABLET | Freq: Two times a day (BID) | ORAL | Status: DC | PRN

## 2024-01-24 MED ORDER — ONDANSETRON HCL 4 MG/2ML IJ SOLN
INTRAMUSCULAR | Status: AC
  Filled 2024-01-24: qty 2

## 2024-01-24 MED ORDER — DIPHENHYDRAMINE HCL 25 MG PO CAPS: 25.0000 mg | ORAL_CAPSULE | Freq: Four times a day (QID) | ORAL | Status: DC | PRN

## 2024-01-24 MED ORDER — CEFAZOLIN SODIUM-DEXTROSE 2-4 GM/100ML-% IV SOLN
2.0000 g | INTRAVENOUS | Status: AC
  Administered 2024-01-24: 2 g via INTRAVENOUS

## 2024-01-24 MED ORDER — TRANEXAMIC ACID 1000 MG/10ML IV SOLN
Status: DC | PRN
  Administered 2024-01-24: 3000 mg via TOPICAL

## 2024-01-24 MED ORDER — GABAPENTIN 300 MG PO CAPS
300.0000 mg | ORAL_CAPSULE | Freq: Two times a day (BID) | ORAL | Status: DC
Start: 2024-01-24 — End: 2024-01-25
  Administered 2024-01-24: 300 mg via ORAL
  Filled 2024-01-24 (×2): qty 1

## 2024-01-24 MED ORDER — PHENYLEPHRINE HCL (PRESSORS) 10 MG/ML IV SOLN
INTRAVENOUS | Status: DC | PRN
  Administered 2024-01-24: 160 ug via INTRAVENOUS
  Administered 2024-01-24 (×2): 80 ug via INTRAVENOUS

## 2024-01-24 MED ORDER — ROCURONIUM BROMIDE 10 MG/ML (PF) SYRINGE
PREFILLED_SYRINGE | INTRAVENOUS | Status: AC
  Filled 2024-01-24: qty 10

## 2024-01-24 MED ORDER — SENNA 8.6 MG PO TABS
1.0000 | ORAL_TABLET | Freq: Two times a day (BID) | ORAL | Status: DC
  Administered 2024-01-24 (×2): 8.6 mg via ORAL
  Filled 2024-01-24 (×2): qty 1

## 2024-01-24 MED ORDER — CITALOPRAM HYDROBROMIDE 40 MG PO TABS
40.0000 mg | ORAL_TABLET | Freq: Every day | ORAL | Status: DC
  Filled 2024-01-24: qty 1

## 2024-01-24 MED ORDER — ACETAMINOPHEN 500 MG PO TABS
1000.0000 mg | ORAL_TABLET | Freq: Four times a day (QID) | ORAL | Status: DC
  Administered 2024-01-24 – 2024-01-25 (×4): 1000 mg via ORAL
  Filled 2024-01-24 (×4): qty 2

## 2024-01-24 MED ORDER — PROCHLORPERAZINE MALEATE 10 MG PO TABS
10.0000 mg | ORAL_TABLET | Freq: Four times a day (QID) | ORAL | Status: DC | PRN
  Filled 2024-01-24: qty 1

## 2024-01-24 MED ORDER — PROCHLORPERAZINE EDISYLATE 10 MG/2ML IJ SOLN: 5.0000 mg | Freq: Four times a day (QID) | INTRAMUSCULAR | Status: DC | PRN

## 2024-01-24 MED ORDER — PHENYLEPHRINE HCL-NACL 20-0.9 MG/250ML-% IV SOLN
INTRAVENOUS | Status: DC | PRN
Start: 2024-01-24 — End: 2024-01-24
  Administered 2024-01-24: 40 ug/min via INTRAVENOUS

## 2024-01-24 MED ORDER — BUPIVACAINE HCL (PF) 0.25 % IJ SOLN
INTRAMUSCULAR | Status: AC
  Filled 2024-01-24: qty 30

## 2024-01-24 MED ORDER — IBUPROFEN 800 MG PO TABS
800.0000 mg | ORAL_TABLET | Freq: Three times a day (TID) | ORAL | 2 refills | Status: DC | PRN
  Filled 2024-01-24: qty 60, 20d supply, fill #0

## 2024-01-24 MED ORDER — EPHEDRINE SULFATE (PRESSORS) 50 MG/ML IJ SOLN
INTRAMUSCULAR | Status: DC | PRN
  Administered 2024-01-24 (×2): 10 mg via INTRAVENOUS

## 2024-01-24 MED ORDER — ACETAMINOPHEN 500 MG PO TABS
1000.0000 mg | ORAL_TABLET | Freq: Four times a day (QID) | ORAL | 0 refills | Status: DC
  Filled 2024-01-24: qty 30, 4d supply, fill #0

## 2024-01-24 MED ORDER — SUGAMMADEX SODIUM 200 MG/2ML IV SOLN
INTRAVENOUS | Status: DC | PRN
  Administered 2024-01-24: 200 mg via INTRAVENOUS

## 2024-01-24 MED ORDER — KETAMINE HCL 10 MG/ML IJ SOLN
INTRAMUSCULAR | Status: DC | PRN
  Administered 2024-01-24 (×3): 10 mg via INTRAVENOUS

## 2024-01-24 MED ORDER — MORPHINE SULFATE (PF) 4 MG/ML IV SOLN: 1.0000 mg | INTRAVENOUS | Status: DC | PRN

## 2024-01-24 MED ORDER — EPHEDRINE 5 MG/ML INJ
INTRAVENOUS | Status: AC
  Filled 2024-01-24: qty 5

## 2024-01-24 MED ORDER — SODIUM CHLORIDE (PF) 0.9 % IJ SOLN
INTRAMUSCULAR | Status: AC
  Filled 2024-01-24: qty 50

## 2024-01-24 MED ORDER — SUCCINYLCHOLINE CHLORIDE 200 MG/10ML IV SOSY
PREFILLED_SYRINGE | INTRAVENOUS | Status: AC
  Filled 2024-01-24: qty 10

## 2024-01-24 MED ORDER — ACETAMINOPHEN 10 MG/ML IV SOLN: 1000.0000 mg | Freq: Once | INTRAVENOUS | Status: DC | PRN

## 2024-01-24 MED ORDER — PHENYLEPHRINE 80 MCG/ML (10ML) SYRINGE FOR IV PUSH (FOR BLOOD PRESSURE SUPPORT)
PREFILLED_SYRINGE | INTRAVENOUS | Status: AC
  Filled 2024-01-24: qty 10

## 2024-01-24 MED ORDER — FENTANYL CITRATE (PF) 100 MCG/2ML IJ SOLN
100.0000 ug | Freq: Once | INTRAMUSCULAR | Status: AC
  Administered 2024-01-24: 100 ug via INTRAVENOUS

## 2024-01-24 MED ORDER — BUPIVACAINE HCL (PF) 0.25 % IJ SOLN
INTRAMUSCULAR | Status: DC | PRN
Start: 2024-01-24 — End: 2024-01-24
  Administered 2024-01-24 (×2): 20 mL via PERINEURAL

## 2024-01-24 MED ORDER — ORAL CARE MOUTH RINSE: 15.0000 mL | Freq: Once | OROMUCOSAL | Status: DC

## 2024-01-24 MED ORDER — GABAPENTIN 300 MG PO CAPS
300.0000 mg | ORAL_CAPSULE | Freq: Two times a day (BID) | ORAL | 2 refills | Status: DC
  Filled 2024-01-24: qty 60, 30d supply, fill #0

## 2024-01-24 MED ORDER — ACETAMINOPHEN 500 MG PO TABS
1000.0000 mg | ORAL_TABLET | ORAL | Status: AC
  Administered 2024-01-24: 1000 mg via ORAL

## 2024-01-24 MED ORDER — DEXAMETHASONE SODIUM PHOSPHATE 4 MG/ML IJ SOLN
INTRAMUSCULAR | Status: DC | PRN
  Administered 2024-01-24: 5 mg via INTRAVENOUS

## 2024-01-24 MED ORDER — TRANEXAMIC ACID 1000 MG/10ML IV SOLN
INTRAVENOUS | Status: AC
  Filled 2024-01-24: qty 30

## 2024-01-24 MED ORDER — ONDANSETRON HCL 4 MG/2ML IJ SOLN
INTRAMUSCULAR | Status: DC | PRN
  Administered 2024-01-24: 4 mg via INTRAVENOUS

## 2024-01-24 MED ORDER — ATROPINE SULFATE 0.4 MG/ML IV SOLN
INTRAVENOUS | Status: AC
  Filled 2024-01-24: qty 1

## 2024-01-24 MED ORDER — METHOCARBAMOL 500 MG PO TABS
500.0000 mg | ORAL_TABLET | Freq: Four times a day (QID) | ORAL | Status: DC | PRN
  Administered 2024-01-24 – 2024-01-25 (×3): 500 mg via ORAL
  Filled 2024-01-24 (×3): qty 1

## 2024-01-24 MED ORDER — SODIUM CHLORIDE (PF) 0.9 % IJ SOLN
INTRAMUSCULAR | Status: AC
  Filled 2024-01-24: qty 20

## 2024-01-24 MED ORDER — EPINEPHRINE PF 1 MG/ML IJ SOLN
INTRAMUSCULAR | Status: AC
  Filled 2024-01-24: qty 1

## 2024-01-24 MED ORDER — OXYCODONE HCL 5 MG/5ML PO SOLN: 5.0000 mg | Freq: Once | ORAL | Status: AC | PRN

## 2024-01-24 MED ORDER — CEFAZOLIN SODIUM-DEXTROSE 2-4 GM/100ML-% IV SOLN
INTRAVENOUS | Status: AC
  Filled 2024-01-24: qty 100

## 2024-01-24 MED ORDER — METHOCARBAMOL 500 MG PO TABS
500.0000 mg | ORAL_TABLET | Freq: Four times a day (QID) | ORAL | 2 refills | Status: DC | PRN
  Filled 2024-01-24: qty 30, 8d supply, fill #0
  Filled 2024-02-14: qty 30, 8d supply, fill #1

## 2024-01-24 MED ORDER — OXYCODONE HCL 5 MG PO TABS
5.0000 mg | ORAL_TABLET | Freq: Four times a day (QID) | ORAL | 0 refills | Status: DC | PRN
Start: 2024-01-24 — End: 2024-04-08
  Filled 2024-01-24: qty 30, 4d supply, fill #0

## 2024-01-24 MED ORDER — MIDAZOLAM HCL 2 MG/2ML IJ SOLN
2.0000 mg | Freq: Once | INTRAMUSCULAR | Status: AC
  Administered 2024-01-24: 2 mg via INTRAVENOUS

## 2024-01-24 MED ORDER — ACETAMINOPHEN 500 MG PO TABS
ORAL_TABLET | ORAL | Status: AC
  Filled 2024-01-24: qty 2

## 2024-01-24 MED ORDER — MIDAZOLAM HCL 2 MG/2ML IJ SOLN
INTRAMUSCULAR | Status: AC
Start: 2024-01-24 — End: ?
  Filled 2024-01-24: qty 2

## 2024-01-24 MED ORDER — BUPIVACAINE-EPINEPHRINE (PF) 0.5% -1:200000 IJ SOLN
INTRAMUSCULAR | Status: AC
  Filled 2024-01-24: qty 30

## 2024-01-24 SURGICAL SUPPLY — 54 items
BAG DECANTER FOR FLEXI CONT (MISCELLANEOUS) IMPLANT
BINDER BREAST LRG (GAUZE/BANDAGES/DRESSINGS) IMPLANT
BINDER BREAST XLRG (GAUZE/BANDAGES/DRESSINGS) IMPLANT
BINDER BREAST XXLRG (GAUZE/BANDAGES/DRESSINGS) IMPLANT
BIOPATCH RED 1 DISK 7.0 (GAUZE/BANDAGES/DRESSINGS) ×2 IMPLANT
BLADE SURG 10 STRL SS (BLADE) ×2 IMPLANT
BLADE SURG 15 STRL LF DISP TIS (BLADE) IMPLANT
BNDG COHESIVE 3X5 TAN ST LF (GAUZE/BANDAGES/DRESSINGS) ×2 IMPLANT
CANISTER SUCT 1200ML W/VALVE (MISCELLANEOUS) ×2 IMPLANT
CHLORAPREP W/TINT 26 (MISCELLANEOUS) ×2 IMPLANT
CLIP TI LARGE 6 (CLIP) IMPLANT
CLIP TI MEDIUM 6 (CLIP) ×2 IMPLANT
CLIP TI WIDE RED SMALL 6 (CLIP) IMPLANT
COVER MAYO STAND STRL (DRAPES) ×2 IMPLANT
DRAIN CHANNEL 19F RND (DRAIN) ×2 IMPLANT
DRAPE UTILITY XL STRL (DRAPES) ×2 IMPLANT
DRSG TEGADERM 2-3/8X2-3/4 SM (GAUZE/BANDAGES/DRESSINGS) ×2 IMPLANT
ELECT REM PT RETURN 9FT ADLT (ELECTROSURGICAL) ×1 IMPLANT
ELECTRODE REM PT RTRN 9FT ADLT (ELECTROSURGICAL) ×2 IMPLANT
EVACUATOR SILICONE 100CC (DRAIN) ×2 IMPLANT
GAUZE PAD ABD 8X10 STRL (GAUZE/BANDAGES/DRESSINGS) ×4 IMPLANT
GAUZE SPONGE 4X4 12PLY STRL (GAUZE/BANDAGES/DRESSINGS) ×2 IMPLANT
GAUZE XEROFORM 1X8 LF (GAUZE/BANDAGES/DRESSINGS) IMPLANT
GLOVE BIO SURGEON STRL SZ 6 (GLOVE) ×2 IMPLANT
GLOVE BIOGEL PI IND STRL 6.5 (GLOVE) ×2 IMPLANT
GOWN STRL REUS W/ TWL LRG LVL3 (GOWN DISPOSABLE) ×2 IMPLANT
GOWN STRL REUS W/ TWL XL LVL3 (GOWN DISPOSABLE) ×2 IMPLANT
LIGHT WAVEGUIDE WIDE FLAT (MISCELLANEOUS) ×2 IMPLANT
NDL HYPO 25X1 1.5 SAFETY (NEEDLE) IMPLANT
NEEDLE HYPO 25X1 1.5 SAFETY (NEEDLE) IMPLANT
NS IRRIG 1000ML POUR BTL (IV SOLUTION) ×2 IMPLANT
PACK BASIN DAY SURGERY FS (CUSTOM PROCEDURE TRAY) ×2 IMPLANT
PACK UNIVERSAL I (CUSTOM PROCEDURE TRAY) ×2 IMPLANT
PENCIL SMOKE EVACUATOR (MISCELLANEOUS) ×2 IMPLANT
PIN SAFETY STERILE (MISCELLANEOUS) ×2 IMPLANT
SLEEVE SCD COMPRESS KNEE MED (STOCKING) ×2 IMPLANT
SPIKE FLUID TRANSFER (MISCELLANEOUS) IMPLANT
SPONGE T-LAP 18X18 ~~LOC~~+RFID (SPONGE) ×4 IMPLANT
STAPLER SKIN PROX WIDE 3.9 (STAPLE) IMPLANT
STOCKINETTE IMPERVIOUS LG (DRAPES) ×2 IMPLANT
STRIP CLOSURE SKIN 1/2X4 (GAUZE/BANDAGES/DRESSINGS) ×6 IMPLANT
SUT ETHILON 2 0 FS 18 (SUTURE) ×2 IMPLANT
SUT ETHILON 2 0 FSLX (SUTURE) IMPLANT
SUT MNCRL AB 4-0 PS2 18 (SUTURE) ×2 IMPLANT
SUT SILK 0 TIES 10X30 (SUTURE) IMPLANT
SUT SILK 2 0 SH (SUTURE) ×2 IMPLANT
SUT VIC AB 3-0 SH 27X BRD (SUTURE) IMPLANT
SUT VICRYL 3-0 CR8 SH (SUTURE) ×6 IMPLANT
SUT VICRYL AB 2 0 TIE (SUTURE) IMPLANT
SYR CONTROL 10ML LL (SYRINGE) IMPLANT
TOWEL GREEN STERILE FF (TOWEL DISPOSABLE) ×2 IMPLANT
TUBE CONNECTING 20X1/4 (TUBING) ×2 IMPLANT
UNDERPAD 30X36 HEAVY ABSORB (UNDERPADS AND DIAPERS) ×2 IMPLANT
YANKAUER SUCT BULB TIP NO VENT (SUCTIONS) ×2 IMPLANT

## 2024-01-24 NOTE — Transfer of Care (Signed)
 Immediate Anesthesia Transfer of Care Note  Patient: Ann Daugherty  Procedure(s) Performed: BILATERAL MASTECTOMY (Bilateral: Breast)  Patient Location: PACU  Anesthesia Type:GA combined with regional for post-op pain  Level of Consciousness: awake, alert , oriented, and patient cooperative  Airway & Oxygen Therapy: Patient Spontanous Breathing and Patient connected to face mask oxygen  Post-op Assessment: Report given to RN and Post -op Vital signs reviewed and stable  Post vital signs: Reviewed and stable  Last Vitals:  Vitals Value Taken Time  BP 105/62 01/24/24 1223  Temp 36.3 C 01/24/24 1223  Pulse 108 01/24/24 1224  Resp 14 01/24/24 1224  SpO2 100 % 01/24/24 1224  Vitals shown include unfiled device data.  Last Pain:  Vitals:   01/24/24 6301  TempSrc:   PainSc: 0-No pain      Patients Stated Pain Goal: 3 (01/24/24 6010)  Complications:  Encounter Notable Events  Notable Event Outcome Phase Comment  Difficult to intubate - expected  Intraprocedure Filed from anesthesia note documentation.

## 2024-01-24 NOTE — Anesthesia Procedure Notes (Signed)
 Procedure Name: Intubation Date/Time: 01/24/2024 9:06 AM  Performed by: Ronnette Hila, CRNAPre-anesthesia Checklist: Patient identified, Emergency Drugs available, Suction available and Patient being monitored Patient Re-evaluated:Patient Re-evaluated prior to induction Oxygen Delivery Method: Circle system utilized Preoxygenation: Pre-oxygenation with 100% oxygen Induction Type: IV induction Ventilation: Mask ventilation without difficulty Laryngoscope Size: Mac, 3 and Glidescope Grade View: Grade II Tube type: Oral Tube size: 7.0 mm Number of attempts: 1 Airway Equipment and Method: Stylet, Oral airway, Video-laryngoscopy and Bougie stylet Placement Confirmation: ETT inserted through vocal cords under direct vision, positive ETCO2 and breath sounds checked- equal and bilateral Tube secured with: Tape Dental Injury: Teeth and Oropharynx as per pre-operative assessment  Difficulty Due To: Difficulty was anticipated, Difficult Airway- due to anterior larynx and Difficult Airway- due to dentition

## 2024-01-24 NOTE — Anesthesia Postprocedure Evaluation (Signed)
 Anesthesia Post Note  Patient: Ann Daugherty  Procedure(s) Performed: BILATERAL MASTECTOMY (Bilateral: Breast)     Patient location during evaluation: PACU Anesthesia Type: Regional and General Level of consciousness: awake and alert Pain management: pain level controlled Vital Signs Assessment: post-procedure vital signs reviewed and stable Respiratory status: spontaneous breathing, nonlabored ventilation, respiratory function stable and patient connected to nasal cannula oxygen Cardiovascular status: blood pressure returned to baseline and stable Postop Assessment: no apparent nausea or vomiting Anesthetic complications: yes  Encounter Notable Events  Notable Event Outcome Phase Comment  Difficult to intubate - expected  Intraprocedure Filed from anesthesia note documentation.    Last Vitals:  Vitals:   01/24/24 1230 01/24/24 1315  BP: (!) 113/55 106/62  Pulse: 91 80  Resp: 15 18  Temp:  (!) 36.2 C  SpO2: 100% 99%    Last Pain:  Vitals:   01/24/24 1315  TempSrc:   PainSc: 0-No pain                 Trevor Iha

## 2024-01-24 NOTE — Op Note (Signed)
 Bilateral Mastectomies  Indications: This patient presents with history of left breast cancer   Pre-operative Diagnosis: left breast cancer, [id number], upper inner quadrant, receptors +/+/-  Post-operative Diagnosis: same  Surgeon: Almond Lint   Assistant:  Jeronimo Greaves, RNFA  Anesthesia: General endotracheal anesthesia and pectoral block  Procedure Details  The patient was seen in the Holding Room. The risks, benefits, complications, treatment options, and expected outcomes were discussed with the patient. The possibilities of reaction to medication, pulmonary aspiration, bleeding, infection, the need for additional procedures, failure to diagnose a condition, and creating a complication requiring transfusion or operation were discussed with the patient. The patient concurred with the proposed plan, giving informed consent.  The site of surgery properly noted/marked. The patient was taken to Operating Room # 7 and placed supine on the OR table.   After induction of anesthesia, the bilateral breast and chest were prepped and draped in standard fashion.  The patient was identified as Ann Daugherty and the procedure verified as Bilateral Mastectomies. A Time Out was held and the above information confirmed.  The incisions had been previously marked by plastics.  The right side was addressed first.    The superior incision was made with the #10 blade.  Mastectomy hooks were used to provide elevation of the skin edges, and the cautery was used to create the mastectomy flaps.  The dissection was taken to the fascia of the pectoralis major.  The penetrating vessels were clipped as needed.  The superior flap was taken medially to the lateral sternal border, superiorly to the inferior border of the clavicle.  The inferior flap was similarly created, inferiorly to the inframammary fold and laterally to the border of the latissimus.  The breast was taken off including the pectoralis fascia and the axillary  tail marked.  The wound was irrigated.  Hemostasis was achieved with cautery.  One 19 Blake drain was placed laterally and secured with a 2-0 nylon.  TXA soaked lap was placed in the wound.  Penetrating towel clips were placed to close the skin over the top.  It was fairly tight and the skin was mobilized inferiorly.    The left side was then addressed similarly.    The skin was then closed with a 3-0 Vicryl deep dermal interrupted sutures and 4-0 Vicryl subcuticular closure in layers.      Sterile dressings were applied. At the end of the operation, all sponge, instrument, and needle counts were correct.  Findings: grossly clear surgical margins  Estimated Blood Loss: min          Drains: 19 Fr blake drain in bilateral chest wall                Specimens: right breast and left breast         Complications:  None; patient tolerated the procedure well.         Disposition: PACU - hemodynamically stable.         Condition: stable

## 2024-01-24 NOTE — Progress Notes (Signed)
 Assisted Dr. Richardson Landry with left, right, pectoralis, ultrasound guided block. Side rails up, monitors on throughout procedure. See vital signs in flow sheet. Tolerated Procedure well.

## 2024-01-24 NOTE — Anesthesia Procedure Notes (Signed)
 Anesthesia Regional Block: Pectoralis block   Pre-Anesthetic Checklist: , timeout performed,  Correct Patient, Correct Site, Correct Laterality,  Correct Procedure, Correct Position, site marked,  Risks and benefits discussed,  Surgical consent,  Pre-op evaluation,  At surgeon's request and post-op pain management  Laterality: Right and Upper  Prep: chloraprep       Needles:  Injection technique: Single-shot  Needle Type: Echogenic Needle     Needle Length: 9cm  Needle Gauge: 21     Additional Needles:   Procedures:,,,, ultrasound used (permanent image in chart),,    Narrative:  Start time: 01/24/2024 7:58 AM End time: 01/24/2024 8:04 AM Injection made incrementally with aspirations every 5 mL.  Performed by: Personally  Anesthesiologist: Trevor Iha, MD  Additional Notes: Block assessed. Patient tolerated procedure well.

## 2024-01-24 NOTE — Interval H&P Note (Signed)
 History and Physical Interval Note:  01/24/2024 8:36 AM  Ann Daugherty  has presented today for surgery, with the diagnosis of LEFT BREAST CANCER, FAMILY HISTORY OF BREAST CANCER.  The various methods of treatment have been discussed with the patient and family. After consideration of risks, benefits and other options for treatment, the patient has consented to  Procedure(s) with comments: BILATERAL MASTECTOMY (Bilateral) - PEC BLOCK as a surgical intervention.  The patient's history has been reviewed, patient examined, no change in status, stable for surgery.  I have reviewed the patient's chart and labs.  Questions were answered to the patient's satisfaction.     Almond Lint

## 2024-01-24 NOTE — Discharge Instructions (Signed)
[facility], [place] ?[phone] ? ?MASTECTOMY: POST OP INSTRUCTIONS ? ?Always review your discharge instruction sheet given to you by the facility where your surgery was performed. ?IF YOU HAVE DISABILITY OR FAMILY LEAVE FORMS, YOU MUST BRING THEM TO THE OFFICE FOR PROCESSING.   ?DO NOT GIVE THEM TO YOUR DOCTOR. ?Take 2 tylenol (acetominophen) three times a day for 3 days.  If you still have pain, add ibuprofen with food in between if able to take this (if you have kidney  ?issues or stomach issues, do not take ibuprofen).  There is also a muscle relaxant for muscle spasms.   ? ?I have written a script for gabapentin.  This is helpful for burning type pain and is useful for several weeks to take twice daily.  ? ?If both of those are not enough, add the narcotic pain pill.   ? ?If you find you are needing a lot of this overnight after surgery, call the next morning for a refill.   ? ?Take your usually prescribed medications unless otherwise directed. ?If you need a refill on your pain medication, please contact your pharmacy.  They will contact our office to request authorization.  Prescriptions will not be filled after 5pm or on week-ends. ?You should follow a light diet the first few days after arrival home, such as soup and crackers, etc.  Resume your normal diet the day after surgery. ?Most patients will experience some swelling and bruising on the chest and underarm.  Ice packs will help.  Swelling and bruising can take several days to resolve.  ?It is common to experience some constipation if taking pain medication after surgery.  Increasing fluid intake and taking a stool softener (such as Colace) will usually help or prevent this problem from occurring.  A mild laxative (Milk of Magnesia or Miralax) should be taken according to package instructions if there are no bowel movements after 48 hours. ?You may remove the gauze from under the binder and replace as needed.  Keep the drains pinned  under the binder unless you are emptying them.  SHORT showers only are permitted after post op day 3.  ?DRAINS:  If you have drains in place, it is important to keep a list of the amount of drainage produced each day in your drains.  Before leaving the hospital, you should be instructed on drain care.  Call our office if you have any questions about your drains. ?ACTIVITIES:  You may resume regular (light) daily activities beginning the next day--such as daily self-care, walking, climbing stairs--gradually increasing activities as tolerated.  You may have sexual intercourse when it is comfortable.  Refrain from any heavy lifting or straining until approved by your doctor. ?You may drive when you are no longer taking prescription pain medication, you can comfortably wear a seatbelt, and you can safely maneuver your car and apply brakes. ?RETURN TO WORK:  __________________________________________________________ ?You should see your doctor in the office for a follow-up appointment approximately 3-5 days after your surgery.  Your doctor?s nurse will typically make your follow-up appointment when she calls you with your pathology report.  Expect your pathology report 2-3 business days after your surgery.  You may call to check if you do not hear from [place] after three days.   ?OTHER INSTRUCTIONS: ______________________________________________________________________________________________ ____________________________________________________________________________________________ ?WHEN TO CALL YOUR DOCTOR: ?Fever over 101.0 ?Nausea and/or vomiting ?Extreme swelling or bruising ?Continued bleeding from incision. ?Increased pain, redness, or drainage from the incision. ?The clinic staff is available to  answer your questions during regular business hours.  Please don?t hesitate to call and ask to speak to one of the nurses for clinical concerns.  If you have a medical emergency, go to the nearest emergency room or call 911.   A surgeon from [facility] is always on call at the hospital. ?[address], [place], [place], [place]  [address] ? [address], [place], [place]   [address] ?[phone] ? [phone] ? FAX [phone] ?Web site: [email or web address] ?

## 2024-01-24 NOTE — Addendum Note (Signed)
 Addendum  created 01/24/24 1344 by Ronnette Hila, CRNA   Flowsheet accepted

## 2024-01-24 NOTE — Anesthesia Procedure Notes (Signed)
 Anesthesia Regional Block: Pectoralis block   Pre-Anesthetic Checklist: , timeout performed,  Correct Patient, Correct Site, Correct Laterality,  Correct Procedure, Correct Position, site marked,  Risks and benefits discussed,  Surgical consent,  Pre-op evaluation,  At surgeon's request and post-op pain management  Laterality: Upper and Left  Prep: chloraprep       Needles:  Injection technique: Single-shot  Needle Type: Echogenic Needle     Needle Length: 9cm  Needle Gauge: 21     Additional Needles:   Procedures:,,,, ultrasound used (permanent image in chart),,    Narrative:  Start time: 01/24/2024 7:48 AM End time: 01/24/2024 7:57 AM Injection made incrementally with aspirations every 5 mL.  Performed by: Personally  Anesthesiologist: Trevor Iha, MD  Additional Notes: Block assessed. Patient tolerated procedure well.

## 2024-01-25 ENCOUNTER — Encounter (HOSPITAL_BASED_OUTPATIENT_CLINIC_OR_DEPARTMENT_OTHER): Payer: Self-pay | Admitting: General Surgery

## 2024-01-25 DIAGNOSIS — C50212 Malignant neoplasm of upper-inner quadrant of left female breast: Secondary | ICD-10-CM | POA: Diagnosis not present

## 2024-01-28 DIAGNOSIS — C50112 Malignant neoplasm of central portion of left female breast: Secondary | ICD-10-CM | POA: Diagnosis not present

## 2024-01-28 DIAGNOSIS — Z9011 Acquired absence of right breast and nipple: Secondary | ICD-10-CM | POA: Diagnosis not present

## 2024-01-29 ENCOUNTER — Encounter: Payer: Self-pay | Admitting: *Deleted

## 2024-01-31 ENCOUNTER — Encounter: Payer: Self-pay | Admitting: *Deleted

## 2024-01-31 ENCOUNTER — Ambulatory Visit
Admission: RE | Admit: 2024-01-31 | Discharge: 2024-01-31 | Disposition: A | Discharge status: Home / Self Care | Source: Ambulatory Visit | Attending: Physician Assistant | Admitting: Physician Assistant

## 2024-01-31 DIAGNOSIS — R160 Hepatomegaly, not elsewhere classified: Secondary | ICD-10-CM

## 2024-01-31 DIAGNOSIS — C50919 Malignant neoplasm of unspecified site of unspecified female breast: Secondary | ICD-10-CM | POA: Diagnosis not present

## 2024-01-31 DIAGNOSIS — C787 Secondary malignant neoplasm of liver and intrahepatic bile duct: Secondary | ICD-10-CM | POA: Diagnosis not present

## 2024-01-31 HISTORY — PX: IR RADIOLOGIST EVAL & MGMT: IMG5224

## 2024-01-31 LAB — SURGICAL PATHOLOGY

## 2024-01-31 NOTE — Progress Notes (Signed)
 Chief Complaint: Patient was consulted remotely today (TeleHealth) for breast cancer metastatic to liver at the request of Watterson,Shannon A.    Referring Physician(s): Watterson,Shannon A  History of Present Illness: Ann Daugherty is a 53 y.o. female with a history of left breast cancer stage IV -  (cT4b, cN0, cM1, G3, ER+, PR+, HER2-).  She has lesions in her liver and underwent biopsy of one of the liver lesions in October 2024 confirming metastatic carcinoma from breast primary.   She has recently completed neoadjuvant chemotherapy with Adriamycin and Cytoxan followed by Taxol completed in February 2025.   She is now a candidate for potential liver directed therapy.  The dominant lesion in segment 6 has regressed from 3.6 cm to 1.4 cm and is an excellent candidate for percutaneous microwave ablation.   There is a smaller subcentimeter lesion in the anterior dome which is questionable and warrants close observation.   She had a technically successful percutaneous thermal ablation of the segment 6 lesion on 01/16/2024.  The procedure went well and without complication and she was discharged home the same day.  We had a video conference visit today for her 2-week follow-up evaluation.  She is doing very well.  She has had no adverse events from her ablation procedure and recovered very quickly.  She has also undergone her bilateral mastectomy.  She states that her treatments have been life-changing and her mental and emotional state is greatly improved knowing that the tumors are out of her body.  She is extremely pleased with the entire process and has no complaints.  Past Medical History:  Diagnosis Date   ADD (attention deficit disorder) 2015   Does not take any medication   Anxiety    Cancer (HCC)    breast   Depression    GERD (gastroesophageal reflux disease)    History of chicken pox    PONV (postoperative nausea and vomiting)     Past Surgical History:   Procedure Laterality Date   BACK SURGERY     BREAST BIOPSY Left 07/19/2023   [place] LT BREAST BX W LOC DEV EA ADD LESION IMG BX SPEC US GUIDE 07/19/2023 [facility]   BREAST BIOPSY Left 07/19/2023   [place] LT BREAST BX W LOC DEV 1ST LESION IMG BX SPEC US GUIDE 07/19/2023 [facility]   BREAST BIOPSY Right 08/09/2023   [place] RT BREAST BX W LOC DEV 1ST LESION IMG BX SPEC US GUIDE 08/09/2023 [facility]   BREAST BIOPSY Right 08/09/2023   [place] RT BREAST BX W LOC DEV EA ADD LESION IMG BX SPEC US GUIDE 08/09/2023 [facility]   IR GUIDED LIVER TUMOR ABLATION RFA PERC     IR RADIOLOGIST EVAL & MGMT  01/08/2024   IR RADIOLOGIST EVAL & MGMT  01/31/2024   LAMINECTOMY     2007, 2011   PORTACATH PLACEMENT Right 08/14/2023   Procedure: INSERTION PORT-A-CATH WITH ULTRASOUND GUIDEANCE;  Surgeon: Almond Lint, MD;  Location: [facility];  Service: General;  Laterality: Right;   SIMPLE MASTECTOMY WITH AXILLARY SENTINEL NODE BIOPSY Bilateral 01/24/2024   Procedure: BILATERAL MASTECTOMY;  Surgeon: Almond Lint, MD;  Location: [facility];  Service: General;  Laterality: Bilateral;  PEC BLOCK   VAGINAL DELIVERY  2004, 2006    Allergies: Patient has no allergy information on record.  Medications: Prior to Admission medications   Medication Sig Start Date End Date Taking? Authorizing Provider  acetaminophen (TYLENOL) 500 MG tablet Take 2 tablets (1,000  mg total) by mouth every 6 (six) hours. 01/24/24   Almond Lint, MD  Cholecalciferol (VITAMIN D3) 5000 units CAPS Take 5,000 Units by mouth daily. 12/07/16   [provider]  citalopram (CELEXA) 40 MG tablet Take 1 tablet (40 mg total) by mouth daily. 10/24/23   Loa Socks, NP  gabapentin (NEURONTIN) 300 MG capsule Take 1 capsule (300 mg total) by mouth 2 (two) times daily. 01/24/24   Almond Lint, MD  ibuprofen (ADVIL) 800 MG tablet Take 1 tablet (800 mg total) by mouth every 8 (eight) hours as needed. 01/24/24    Almond Lint, MD  lidocaine-prilocaine (EMLA) cream Apply to affected area once 08/10/23   Serena Croissant, MD  LORazepam (ATIVAN) 0.5 MG tablet Take 1 tablet (0.5 mg total) by mouth 2 (two) times daily as needed for anxiety. 08/10/23   Serena Croissant, MD  methocarbamol (ROBAXIN) 500 MG tablet Take 1 tablet (500 mg total) by mouth every 6 (six) hours as needed for muscle spasms. 01/24/24   Almond Lint, MD  oxyCODONE (OXY IR/ROXICODONE) 5 MG immediate release tablet Take 1-2 tablets (5-10 mg total) by mouth every 6 (six) hours as needed for moderate pain (pain score 4-6). 01/24/24   Almond Lint, MD  senna (SENOKOT) 8.6 MG TABS tablet Take 1 tablet (8.6 mg total) by mouth 2 (two) times daily. 01/24/24   Almond Lint, MD  tamoxifen (NOLVADEX) 20 MG tablet Take 1 tablet (20 mg total) by mouth daily. Stop 1 week before surgery 12/27/23   Serena Croissant, MD     Family History  Problem Relation Age of Onset   Breast cancer Mother 6   Colon cancer Mother 53   Asthma Mother    Hypertension Mother    Hearing loss Mother    Hypertension Father    Diabetes Father    Hyperlipidemia Father    Learning disabilities Son    ADD / ADHD Son    Ovarian cancer Maternal Grandmother    Hypertension Maternal Grandfather    Hyperlipidemia Maternal Grandfather    Heart disease Maternal Grandfather    Stroke Maternal Grandfather    Mental illness Paternal Grandmother    Hypertension Paternal Grandfather     Social History   Socioeconomic History   Marital status: Divorced    Spouse name: Not on file   Number of children: 2   Years of education: Not on file   Highest education level: Not on file  Occupational History   Not on file  Tobacco Use   Smoking status: Former    Current packs/day: 0.00    Average packs/day: 0.5 packs/day for 30.0 years (15.0 ttl pk-yrs)    Types: Cigarettes    Start date: 04/07/1991    Quit date: 04/06/2021    Years since quitting: 2.8   Smokeless tobacco: Current   Tobacco  comments:    Uses a "jewel"  Vaping Use   Vaping status: Every Day   Substances: Nicotine  Substance and Sexual Activity   Alcohol use: Yes    Comment: 3-4 drinks a year   Drug use: No   Sexual activity: Not Currently    Birth control/protection: None  Other Topics Concern   Not on file  Social History Narrative   2  sons: 68 y.o. and 2 y.o.    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   Social Drivers of Health   Financial Resource Strain: Not on file  Food Insecurity: Food Insecurity Present (07/27/2023)   Hunger  Vital Sign    Worried About Programme researcher, broadcasting/film/video in the Last Year: Sometimes true    Ran Out of Food in the Last Year: Sometimes true  Transportation Needs: No Transportation Needs (07/27/2023)   PRAPARE - Administrator, Civil Service (Medical): No    Lack of Transportation (Non-Medical): No  Physical Activity: Not on file  Stress: Not on file  Social Connections: Not on file    ECOG Status: 0 - Asymptomatic  Review of Systems  Review of Systems: A 12 point ROS discussed and pertinent positives are indicated in the HPI above.  All other systems are negative.    Physical Exam  General: WNWD, in NAD HEENT: Eyes are clear, no icterus Pulm:  Normal respiratory effort  Vital Signs: LMP 10/07/2023 (Approximate) Comment: started chemo in October 2024  Imaging: IR Radiologist Eval & Mgmt Result Date: 01/31/2024 EXAM: NEW PATIENT OFFICE VISIT CHIEF COMPLAINT: SEE NOTE IN EPIC HISTORY OF PRESENT ILLNESS: SEE NOTE IN EPIC REVIEW OF SYSTEMS: SEE NOTE IN EPIC PHYSICAL EXAMINATION: SEE NOTE IN EPIC ASSESSMENT AND PLAN: SEE NOTE IN EPIC Electronically Signed   By: Malachy Moan M.D.   On: 01/31/2024 09:06   IR Radiologist Eval & Mgmt Result Date: 01/08/2024 EXAM: NEW PATIENT OFFICE VISIT CHIEF COMPLAINT: SEE NOTE IN EPIC HISTORY OF PRESENT ILLNESS: SEE NOTE IN EPIC REVIEW OF SYSTEMS: SEE NOTE IN EPIC PHYSICAL EXAMINATION: SEE NOTE IN EPIC ASSESSMENT AND PLAN:  SEE NOTE IN EPIC Electronically Signed   By: Malachy Moan M.D.   On: 01/08/2024 16:01    Labs:  CBC: Recent Labs    12/12/23 1224 12/20/23 0845 12/27/23 0909 01/16/24 0819  WBC 5.6 4.3 6.1 4.0  HGB 11.2* 12.0 11.5* 11.9*  HCT 33.4* 35.6* 34.4* 35.0*  PLT 281 344 305 241    COAGS: Recent Labs    08/17/23 1115 01/16/24 0819  INR 1.0 1.0    BMP: Recent Labs    12/12/23 1224 12/20/23 0845 12/27/23 0930 01/16/24 0819  NA 136 137 136 138  K 3.5 3.6 4.1 3.8  CL 104 104 104 104  CO2 27 29 28 27   GLUCOSE 99 130* 100* 106*  BUN 13 10 11 14   CALCIUM 9.3 9.2 9.0 9.0  CREATININE 0.55 0.66 0.60 0.62  GFRNONAA >60 >60 >60 >60    LIVER FUNCTION TESTS: Recent Labs    12/12/23 1224 12/20/23 0845 12/27/23 0930 01/16/24 0819  BILITOT 0.3 0.3 0.2 0.5  AST 13* 14* 14* 21  ALT 13 12 11 18   ALKPHOS 68 71 68 56  PROT 6.5 6.7 6.2* 6.6  ALBUMIN 4.1 4.1 4.0 3.7    TUMOR MARKERS: No results for input(s): "AFPTM", "CEA", "CA199", "CHROMGRNA" in the last 8760 hours.  Assessment and Plan:  53 year old female with breast cancer metastatic to the liver now 2 weeks status post percutaneous microwave ablation of a hepatic metastatic implant.  She is doing exceptionally well and has fully recovered.  She feels so much better knowing that the tumor has been treated.  At this point, we will pursue routine surveillance imaging with MRI.  1.)  Surveillance MRI of the abdomen with gadolinium contrast followed by a clinic visit in 3 months.     Electronically Signed: Sterling Big 01/31/2024, 10:22 AM   I spent a total of 15 Minutes in remote  clinical consultation, greater than 50% of which was counseling/coordinating care for breast cancer mets to liver.    Visit  type: Audio and video Public affairs consultant via [employer]).   Alternative for in-person consultation at [facility], [address] [place], [place], [place]. This visit type was conducted due to national  recommendations for restrictions regarding the COVID-19 Pandemic (e.g. social distancing).  This format is felt to be most appropriate for this patient at this time.  All issues noted in this document were discussed and addressed.

## 2024-02-04 ENCOUNTER — Other Ambulatory Visit (HOSPITAL_COMMUNITY): Payer: Self-pay

## 2024-02-04 ENCOUNTER — Other Ambulatory Visit: Payer: Self-pay

## 2024-02-05 ENCOUNTER — Telehealth: Payer: Self-pay

## 2024-02-05 NOTE — Telephone Encounter (Signed)
 Z6109, ICE COMPRESS: RANDOMIZED TRIAL OF LIMB CRYOCOMPRESSION VERSUS CONTINUOUS COMPRESSION VERSUS LOW CYCLIC COMPRESSION FOR THE PREVENTION  OF TAXANE-INDUCED PERIPHERAL NEUROPATHY   Rn called pt touch base about her 24 week follow up. Pt made aware she will need to be seen between 02-25-24 and 04-22-24 for her 24 week follow up for the study. PRO's and assessments will be done when pt comes in for this follow up. RN will continue to monitor pt appointments and schedule a research visit when pt is in clinic already. Pt had no questions at this time and reported she is doing well (healing well from her recent surgery). Pt knows she can reach out with any questions or concerns.   Zerita Boers BSN RN Clinical Research Nurse [facility] Direct Dial: [phone] 02/05/2024  10:35 AM

## 2024-02-07 ENCOUNTER — Encounter: Payer: Self-pay | Admitting: *Deleted

## 2024-02-07 ENCOUNTER — Inpatient Hospital Stay: Attending: Hematology and Oncology | Admitting: Hematology and Oncology

## 2024-02-07 VITALS — HR 65 | Temp 98.0°F | Ht 66.0 in | Wt 136.9 lb

## 2024-02-07 DIAGNOSIS — C787 Secondary malignant neoplasm of liver and intrahepatic bile duct: Secondary | ICD-10-CM | POA: Insufficient documentation

## 2024-02-07 DIAGNOSIS — C50212 Malignant neoplasm of upper-inner quadrant of left female breast: Secondary | ICD-10-CM | POA: Diagnosis not present

## 2024-02-07 DIAGNOSIS — Z79810 Long term (current) use of selective estrogen receptor modulators (SERMs): Secondary | ICD-10-CM | POA: Insufficient documentation

## 2024-02-07 DIAGNOSIS — Z170 Estrogen receptor positive status [ER+]: Secondary | ICD-10-CM | POA: Diagnosis not present

## 2024-02-07 NOTE — Assessment & Plan Note (Signed)
 breast (HCC) 07/19/2023:Palpable left breast mass for 6 months.  Mammogram and ultrasound revealed large masses.   10:30 position: 4.3 cm with skin thickening and ulceration: Grade 2 IDC with high-grade DCIS ER 95%, PR 95%, Ki67 30%, HER2 2+ by IHC, FISH negative ratio 1.37;  5 o'clock position: 3.3 cm with calcifications spanning 6.7 cm, axilla negative, biopsy: Grade 3 IDC with necrosis ER 95% PR 90% Ki-67 60% HER2 1+ negative   CT CAP 08/03/2023: 2 prominent left breast masses, left axillary lymph nodes, right hepatic lobe lesion concerning for metastatic breast cancer Ultrasound a liver biopsy scheduled for 08/17/2023   Treatment plan: Neoadjuvant chemotherapy with dose Adriamycin and Cytoxan followed by Taxol completed 12/27/2023 Mastectomy left breast Adjuvant radiation Liver directed therapy (ablation vs Y90) Antiestrogen therapy with CDK inhibitors ---------------------------------------------------------------------------------------------------- MRI liver 12/26/2023: Significantly diminished size of hepatic metastases (0.6 cm, 1.4 cm, (used to be 3.6 cm), 0.7 cm) MRI breast 12/31/2023: Significant positive response to chemo.  Both left malignancies decrease in size (3 cm with enhancement) and 1.1 cm CT CAP 12/26/2023: Significant decrease in breast masses, resolution of left axillary lymph nodes, decrease liver metastases   01/24/24: Bilateral Mastectomies Right: Benign Left: 2 residual tumors 2.8 cm and 1.6 cm, Margins neg, DCIS, ER 85%, PR: 25%, Her 2: 1+ Neg, Ki 67: 10%  Recommendation: Anti-estrogen therapy with Verzenio Abemaciclib counseling: I discussed at length the risks and benefits of Abemaciclib in combination with letrozole. Adverse effects of Abemaciclib include decreasing neutrophil count, pneumonia, blood clots in lungs as well as nausea and GI symptoms. Side effects of letrozole include hot flashes, muscle aches and pains, uterine bleeding/spotting/cancer, osteoporosis,  risk of blood clots.  RTC for education with Jonny Ruiz

## 2024-02-07 NOTE — Progress Notes (Signed)
 Patient Care Team: Serena Croissant, MD as PCP - General (Hematology and Oncology) Obgyn, Ma Hillock as Consulting Physician (Obstetrics and Gynecology) Donalee Citrin, MD as Consulting Physician (Neurosurgery) Donnelly Angelica, RN as Oncology Nurse Navigator Pershing Proud, RN as Oncology Nurse Navigator Serena Croissant, MD as Consulting Physician (Hematology and Oncology) Almond Lint, MD as Consulting Physician (General Surgery) Lonie Peak, MD as Attending Physician (Radiation Oncology)  DIAGNOSIS:  Encounter Diagnosis  Name Primary?   Malignant neoplasm of upper-inner quadrant of left breast in female, estrogen receptor positive (HCC) Yes    SUMMARY OF ONCOLOGIC HISTORY: Oncology History  Malignant neoplasm of upper-inner quadrant of left female breast (HCC)  07/19/2023 Initial Diagnosis   Palpable left breast mass for 6 months.  Mammogram and ultrasound revealed large masses.  10:30 position: 4.3 cm with skin thickening and ulceration: Grade 2 IDC with high-grade DCIS ER 95%, PR 95%, Ki67 30%, HER2 2+ by IHC, FISH negative ratio 1.37; 5 o'clock position: 3.3 cm with calcifications spanning 6.7 cm, axilla negative, biopsy: Grade 3 IDC with necrosis ER 95% PR 90% Ki-67 60% HER2 1+ negative   07/26/2023 Cancer Staging   Staging form: Breast, AJCC 8th Edition - Clinical: Stage IIIB (cT4b, cN0, cM0, G3, ER+, PR+, HER2-) - Signed by Serena Croissant, MD on 07/26/2023 Histologic grading system: 3 grade system   08/15/2023 -  Chemotherapy   Patient is on Treatment Plan : BREAST ADJUVANT DOSE DENSE AC q14d / PACLitaxel q7d     08/17/2023 Initial Biopsy   Liver biopsy: consistent with metastatic carcinoma, breast primary.     08/21/2023 Cancer Staging   Staging form: Breast, AJCC 8th Edition - Pathologic: Stage IV (pM1) - Signed by Loa Socks, NP on 08/21/2023     CHIEF COMPLIANT: Follow-up after recent mastectomy surgery  HISTORY OF PRESENT ILLNESS: History of Present  Illness The patient, with a history of cancer, presents for a follow-up visit after undergoing chemotherapy and liver ablation. The patient reports that the tumor, initially measuring 6.7 cm, has significantly reduced in size to two small lesions measuring 2.4-1.3 cm. The patient is currently on tamoxifen and has stopped having menstrual cycles since starting the medication. The patient is scheduled for radiation therapy and is considering a hysterectomy. The patient reports no other symptoms or concerns at this time.     ALLERGIES:  has no allergies on file.  MEDICATIONS:  Current Outpatient Medications  Medication Sig Dispense Refill   acetaminophen (TYLENOL) 500 MG tablet Take 2 tablets (1,000 mg total) by mouth every 6 (six) hours. 30 tablet 0   Cholecalciferol (VITAMIN D3) 5000 units CAPS Take 5,000 Units by mouth daily.     citalopram (CELEXA) 40 MG tablet Take 1 tablet (40 mg total) by mouth daily. 90 tablet 1   gabapentin (NEURONTIN) 300 MG capsule Take 1 capsule (300 mg total) by mouth 2 (two) times daily. 60 capsule 2   ibuprofen (ADVIL) 800 MG tablet Take 1 tablet (800 mg total) by mouth every 8 (eight) hours as needed. 60 tablet 2   lidocaine-prilocaine (EMLA) cream Apply to affected area once 30 g 3   LORazepam (ATIVAN) 0.5 MG tablet Take 1 tablet (0.5 mg total) by mouth 2 (two) times daily as needed for anxiety. 30 tablet 1   methocarbamol (ROBAXIN) 500 MG tablet Take 1 tablet (500 mg total) by mouth every 6 (six) hours as needed for muscle spasms. 30 tablet 2   oxyCODONE (OXY IR/ROXICODONE) 5 MG immediate release tablet  Take 1-2 tablets (5-10 mg total) by mouth every 6 (six) hours as needed for moderate pain (pain score 4-6). 30 tablet 0   senna (SENOKOT) 8.6 MG TABS tablet Take 1 tablet (8.6 mg total) by mouth 2 (two) times daily. 120 tablet 0   tamoxifen (NOLVADEX) 20 MG tablet Take 1 tablet (20 mg total) by mouth daily. Stop 1 week before surgery 30 tablet 1   No current  facility-administered medications for this visit.    PHYSICAL EXAMINATION: ECOG PERFORMANCE STATUS: 1 - Symptomatic but completely ambulatory  Vitals:   02/07/24 1056  Pulse: 65  Temp: 98 F (36.7 C)  SpO2: 100%   Filed Weights   02/07/24 1056  Weight: 136 lb 14.4 oz (62.1 kg)   LABORATORY DATA:  I have reviewed the data as listed    Latest Ref Rng & Units 01/16/2024    8:19 AM 12/27/2023    9:30 AM 12/20/2023    8:45 AM  CMP  Glucose 70 - 99 mg/dL 629  528  413   BUN 6 - 20 mg/dL 14  11  10    Creatinine 0.44 - 1.00 mg/dL 2.44  0.10  2.72   Sodium 135 - 145 mmol/L 138  136  137   Potassium 3.5 - 5.1 mmol/L 3.8  4.1  3.6   Chloride 98 - 111 mmol/L 104  104  104   CO2 22 - 32 mmol/L 27  28  29    Calcium 8.9 - 10.3 mg/dL 9.0  9.0  9.2   Total Protein 6.5 - 8.1 g/dL 6.6  6.2  6.7   Total Bilirubin 0.0 - 1.2 mg/dL 0.5  0.2  0.3   Alkaline Phos 38 - 126 U/L 56  68  71   AST 15 - 41 U/L 21  14  14    ALT 0 - 44 U/L 18  11  12      Lab Results  Component Value Date   WBC 4.0 01/16/2024   HGB 11.9 (L) 01/16/2024   HCT 35.0 (L) 01/16/2024   MCV 93.6 01/16/2024   PLT 241 01/16/2024   NEUTROABS 2.6 01/16/2024    ASSESSMENT & PLAN:  Malignant neoplasm of upper-inner quadrant of left female breast (HCC) breast (HCC) 07/19/2023:Palpable left breast mass for 6 months.  Mammogram and ultrasound revealed large masses.   10:30 position: 4.3 cm with skin thickening and ulceration: Grade 2 IDC with high-grade DCIS ER 95%, PR 95%, Ki67 30%, HER2 2+ by IHC, FISH negative ratio 1.37;  5 o'clock position: 3.3 cm with calcifications spanning 6.7 cm, axilla negative, biopsy: Grade 3 IDC with necrosis ER 95% PR 90% Ki-67 60% HER2 1+ negative   CT CAP 08/03/2023: 2 prominent left breast masses, left axillary lymph nodes, right hepatic lobe lesion concerning for metastatic breast cancer Ultrasound a liver biopsy scheduled for 08/17/2023   Treatment plan: Neoadjuvant chemotherapy with dose  Adriamycin and Cytoxan followed by Taxol completed 12/27/2023 Mastectomy left breast 01/24/2024 Adjuvant radiation Liver directed therapy (underwent microwave ablation 01/16/2024) Antiestrogen therapy with CDK inhibitors (currently on tamoxifen, will switch to anastrozole after hysterectomy) ---------------------------------------------------------------------------------------------------- MRI liver 12/26/2023: Significantly diminished size of hepatic metastases (0.6 cm, 1.4 cm, (used to be 3.6 cm), 0.7 cm) MRI breast 12/31/2023: Significant positive response to chemo.  Both left malignancies decrease in size (3 cm with enhancement) and 1.1 cm CT CAP 12/26/2023: Significant decrease in breast masses, resolution of left axillary lymph nodes, decrease liver metastases   01/24/24: Bilateral Mastectomies Right:  Benign Left: 2 residual tumors 2.8 cm and 1.6 cm, Margins neg, DCIS, ER 85%, PR: 25%, Her 2: 1+ Neg, Ki 67: 10%  Recommendation: Anti-estrogen therapy with Verzenio Abemaciclib counseling: I discussed at length the risks and benefits of Abemaciclib in combination with letrozole. Adverse effects of Abemaciclib include decreasing neutrophil count, pneumonia, blood clots in lungs as well as nausea and GI symptoms. Side effects of letrozole include hot flashes, muscle aches and pains, uterine bleeding/spotting/cancer, osteoporosis, risk of blood clots.  I will refer the patient for genetic counseling. I also support her decision for hysterectomy and bilateral salpingo-oophorectomy.  She will do this after radiation is completed.  CT scans in 3 months. Liver MRI in 3 months as well. RTC at the end of radiation to commence Verzinio.  ------------------------------------- Assessment and Plan Assessment & Plan Malignant neoplasm of upper-inner quadrant of left breast, estrogen receptor positive Breast cancer responded well to treatment with significant tumor reduction. Successful microwave ablation  of liver metastasis achieved. Small residual liver lesion to be monitored. Abemaciclib planned post-radiation. Informed of potential side effects. Tamoxifen to continue until radiation and hysterectomy, then switch to anastrozole post-hysterectomy. - Continue tamoxifen until radiation and hysterectomy. - Start abemaciclib post-radiation at 100 mg twice daily, titrate based on tolerance. - Educate on managing diarrhea with Imodium: 2 tablets with first loose stool, 1 tablet with each subsequent loose stool, max 8 per day. - Monitor liver function and neutrophil count regularly. - Switch to anastrozole post-hysterectomy. - Schedule liver MRI every three months. - Order CT chest, abdomen, and pelvis in three months.  Hysterectomy and oophorectomy planning Hysterectomy and oophorectomy planned to reduce cancer risk due to family history and tamoxifen use. Genetic testing considered to support surgery. Plans to switch to anastrozole post-surgery. - Refer to gynecologist for hysterectomy and oophorectomy. - Order genetic testing to support surgical decision. - Continue tamoxifen until one week before surgery, then discontinue. - Switch to anastrozole post-surgery.  Radiation therapy planning Radiation therapy planned post-surgery to allow healing. Prefers slower, smaller dose approach. Radiation oncologist to finalize plan. - Schedule radiation therapy approximately six weeks post-surgery. - Consult with radiation oncologist to finalize radiation plan.  Follow-up Follow-up planned post-radiation to assess status and adjust treatment. CT scan scheduled in three months. - Schedule follow-up appointment for the last day of radiation therapy to discuss further treatment plans. - Order CT chest, abdomen, and pelvis in three months.      No orders of the defined types were placed in this encounter.  The patient has a good understanding of the overall plan. she agrees with it. she will call with  any problems that may develop before the next visit here. Total time spent: 30 mins including face to face time and time spent for planning, charting and co-ordination of care   Ann Meek, MD 02/07/24

## 2024-02-13 ENCOUNTER — Other Ambulatory Visit: Payer: Self-pay | Admitting: Hematology and Oncology

## 2024-02-14 ENCOUNTER — Other Ambulatory Visit (HOSPITAL_COMMUNITY): Payer: Self-pay

## 2024-02-14 MED ORDER — LORAZEPAM 0.5 MG PO TABS
0.5000 mg | ORAL_TABLET | Freq: Two times a day (BID) | ORAL | 1 refills | Status: DC | PRN
Start: 2024-02-14 — End: 2024-08-27
  Filled 2024-02-14: qty 30, 15d supply, fill #0
  Filled 2024-05-30: qty 30, 15d supply, fill #1

## 2024-02-15 NOTE — Progress Notes (Signed)
 Location of Breast Cancer: Malignant Neoplasm of Upper Inner Quadrant of Left Breast   Histology per Pathology Report:    Receptor Status: ER(95 Positive), PR (90% Positive), Her2-neu (Negative), Ki-67(60%)  Did patient present with symptoms (if so, please note symptoms) or was this found on screening mammography?:  07/19/2023 Dr.Gudena  Palpable Left Breast Mass for 6 Months,  Mammogram  Past/Anticipated interventions by surgeon, if any: 01/24/2024 Dr. Cherlynn Cornfield Bilateral Mastectomy   Past/Anticipated interventions by medical oncology, if any:  02/07/2024 Dr. Lee Public   Lymphedema issues, if any: Patient denies   Pain issues, if any:  None, having some tightness on her arm, uses motrin. Goes to physical therapy  SAFETY ISSUES: Prior radiation? None Pacemaker/ICD? None Possible current pregnancy? None Is the patient on methotrexate? None  Current Complaints / other details:   None BP (!) 112/56 (BP Location: Left Arm, Patient Position: Sitting, Cuff Size: Normal)   Pulse 63   Temp (!) 97.4 F (36.3 C)   Resp 18   Ht 5\' 6"  (1.676 m)   Wt 133 lb 12.8 oz (60.7 kg)   SpO2 100%   BMI 21.60 kg/m      Wt Readings from Last 3 Encounters:  02/19/24 133 lb 12.8 oz (60.7 kg)  02/07/24 136 lb 14.4 oz (62.1 kg)  01/24/24 137 lb 12.6 oz (62.5 kg)

## 2024-02-16 DIAGNOSIS — Z419 Encounter for procedure for purposes other than remedying health state, unspecified: Secondary | ICD-10-CM | POA: Diagnosis not present

## 2024-02-18 ENCOUNTER — Encounter: Payer: Self-pay | Admitting: *Deleted

## 2024-02-19 ENCOUNTER — Other Ambulatory Visit: Payer: Self-pay

## 2024-02-19 ENCOUNTER — Ambulatory Visit: Attending: General Surgery | Admitting: Physical Therapy

## 2024-02-19 DIAGNOSIS — M6281 Muscle weakness (generalized): Secondary | ICD-10-CM | POA: Insufficient documentation

## 2024-02-19 DIAGNOSIS — M25611 Stiffness of right shoulder, not elsewhere classified: Secondary | ICD-10-CM | POA: Insufficient documentation

## 2024-02-19 DIAGNOSIS — Z483 Aftercare following surgery for neoplasm: Secondary | ICD-10-CM | POA: Diagnosis not present

## 2024-02-19 DIAGNOSIS — M25612 Stiffness of left shoulder, not elsewhere classified: Secondary | ICD-10-CM | POA: Insufficient documentation

## 2024-02-19 NOTE — Therapy (Signed)
 [facility] [address] [place], [place], [address] Phone: [phone]   Fax:  [phone]  Patient Details  Name: Ann Daugherty MRN: [id number] Date of Birth: 05/28/1971 Referring Provider:  Almond Lint, MD  Encounter Date: 02/19/2024  OUTPATIENT PHYSICAL THERAPY ONCOLOGY EVALUATION  Patient Name: Ann Daugherty MRN: [id number] DOB:Apr 06, 1971, 53 y.o., female Today's Date: 02/19/2024  END OF SESSION:  PT End of Session - 02/19/24 1107     Visit Number 1    Number of Visits 17    Date for PT Re-Evaluation 04/20/24    PT Start Time 0900    PT Stop Time 1000    PT Time Calculation (min) 60 min    Activity Tolerance Patient tolerated treatment well    Behavior During Therapy WFL for tasks assessed/performed             Past Medical History:  Diagnosis Date   ADD (attention deficit disorder) 2015   Does not take any medication   Anxiety    Cancer (HCC)    breast   Depression    GERD (gastroesophageal reflux disease)    History of chicken pox    PONV (postoperative nausea and vomiting)    Past Surgical History:  Procedure Laterality Date   BACK SURGERY     BREAST BIOPSY Left 07/19/2023   [place] LT BREAST BX W LOC DEV EA ADD LESION IMG BX SPEC US GUIDE 07/19/2023 [facility]   BREAST BIOPSY Left 07/19/2023   [place] LT BREAST BX W LOC DEV 1ST LESION IMG BX SPEC US GUIDE 07/19/2023 [facility]   BREAST BIOPSY Right 08/09/2023   [place] RT BREAST BX W LOC DEV 1ST LESION IMG BX SPEC US GUIDE 08/09/2023 [facility]   BREAST BIOPSY Right 08/09/2023   [place] RT BREAST BX W LOC DEV EA ADD LESION IMG BX SPEC US GUIDE 08/09/2023 [facility]   IR GUIDED LIVER TUMOR ABLATION RFA PERC     IR RADIOLOGIST EVAL & MGMT  01/08/2024   IR RADIOLOGIST EVAL & MGMT  01/31/2024   LAMINECTOMY     2007, 2011   PORTACATH PLACEMENT Right 08/14/2023   Procedure: INSERTION PORT-A-CATH WITH ULTRASOUND GUIDEANCE;  Surgeon: Almond Lint,  MD;  Location: [facility];  Service: General;  Laterality: Right;   SIMPLE MASTECTOMY WITH AXILLARY SENTINEL NODE BIOPSY Bilateral 01/24/2024   Procedure: BILATERAL MASTECTOMY;  Surgeon: Almond Lint, MD;  Location: [facility];  Service: General;  Laterality: Bilateral;  PEC BLOCK   VAGINAL DELIVERY  2004, 2006   Patient Active Problem List   Diagnosis Date Noted   Primary cancer of upper inner quadrant of left breast (HCC) 01/24/2024   Port-A-Cath in place 08/15/2023   Malignant neoplasm of upper-inner quadrant of left female breast (HCC) 07/20/2023   Depression, major, single episode, mild (HCC) 04/05/2021   Tobacco abuse 01/30/2018   Anxiety 01/30/2018   Attention deficit disorder 01/30/2018   Vitamin D deficiency 01/30/2018    PCP:   REFERRING PROVIDER: Almond Lint, MD  REFERRING DIAG: C50.112 (ICD-10-CM) - Malignant neoplasm of central portion of left female breast Z17.0 (ICD-10-CM) - Estrogen receptor positive status (ER+)  THERAPY DIAG:  Stiffness of left shoulder, not elsewhere classified  Aftercare following surgery for neoplasm  Muscle weakness (generalized)  Stiffness of right shoulder, not elsewhere classified  ONSET DATE: 07/2023  Rationale for Evaluation and Treatment: Rehabilitation  SUBJECTIVE:  SUBJECTIVE STATEMENT: Pt wants to have the best possible outcome and wants to get through radiation as good as possible  Pt  wants to improve her posture as she is adjusting to the new body image.  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 The 5 o'clock  mass had grade 3 invasive ductal carcinoma that is ER/PR positive, her 2 negative, and Ki 67 60%. The 10:30 mass was grade 2 invasive ductal carcinoma, Er/PR +, her 2 negative and Ki 67 30%. Pt had neoadjuvant chemotherapy during which she had about 30# weight loss.  She metastasis to liver and underwent a liver ablation 01/16/2024. Pt had bilataeral mastectomy on 3/20/ with no lymph nodes removed.  She will start radiation this week.    PAIN:  Are you having pain? yes NPRS scale: 3/10 Pain location: axolla anterior and lateral chest  Pain orientation: Right and Left  PAIN TYPE: tight Pain description: intermittent  Aggravating factors: stretching  Relieving factors: rest  PRECAUTIONS: Other: recent surgery, will be having radiaiton   WEIGHT BEARING RESTRICTIONS: No  FALLS:  Has patient fallen in last 6 months? 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  Mom had had bilateral mastectomies and bilateral knee replacement and colon cancer   HAND DOMINANCE: right   PRIOR LEVEL OF FUNCTION: Independent  PATIENT GOALS: pt want to improve her range of motion and have the best possible outcome from surgery and wants to impove her general strenth    OBJECTIVE: Note: Objective measures were completed at Evaluation unless otherwise noted.  COGNITION: Overall cognitive status: Within functional limits for tasks assessed   PALPATION: Mild fullness at lateral chest below incision on both sides with more on left side   OBSERVATIONS / OTHER ASSESSMENTS: pt with well healing inciscion across front of chest with steristrips still intact in central portion.   Bilateral drain sites with scab intacdt   SENSATION: Pt reports numbness across front of chest and lateral chest   POSTURE: forward head and shoulders . Left ribs rotated forward and prominent in standing. Pt had decreased scapular stabalization on left with scapula moving away from midline with active left shoulder elevation   UPPER EXTREMITY AROM/PROM:  A/PROM RIGHT   eval   Shoulder extension 65  Shoulder flexion 140  Shoulder abduction 135  Shoulder internal rotation   Shoulder external rotation 90    (Blank rows = not tested)  A/PROM LEFT   eval  Shoulder extension 65  Shoulder flexion 155  Shoulder abduction 170  Shoulder internal rotation   Shoulder external rotation 90    (Blank rows = not tested)  CERVICAL AROM: All within normal limits:    UPPER EXTREMITY STRENGTH: shoulders 3-5/5 due to pain from surgery . Otherwise 4+/5 pt reports general weakness from chemo as she feels she has lost muscle mass   LYMPHEDEMA ASSESSMENTS:   SURGERY TYPE/DATE: 01/24/2024 bilateral mastectomy  NUMBER OF LYMPH NODES REMOVED: 0  CHEMOTHERAPY: yes  RADIATION:yes, will start this week   HORMONE TREATMENT: yes  INFECTIONS: no  LYMPHEDEMA ASSESSMENTS:   LANDMARK RIGHT  eval  10 cm proximal to olecranon process 27  Olecranon process 23  10 cm proximal to ulnar styloid process 19.5  Just proximal to ulnar styloid process 15.3  Across hand at thumb web space 19  At base of 2nd digit 5.8  (Blank rows = not tested)  LANDMARK LEFT  eval  10 cm proximal to olecranon process 27  Olecranon process 24  10 cm proximal to ulnar styloid process 19.7  Just proximal to ulnar styloid process 15.7  Across hand at thumb web space 18.7  At base of 2nd digit 5.8  (Blank rows = not tested)    FUNCTIONAL TESTS:  30 seconds chair stand test  13 stit to stand in 30 seconds from low mat height      QUICK DASH SURVEY: 40.91                                                                                                                              TREATMENT DATE:  02/19/2024/  Eval perfomed , results, goals and treatment plan discussed. Instructed in supine dowel exercise, Issued amazing breast forms.    PATIENT EDUCATION:  Education details: supine dowel exercises  Person educated: Patient Education method: Explanation, Demonstration, Tactile cues, Verbal cues, and Handouts Education comprehension: verbalized understanding and returned demonstration  HOME EXERCISE PROGRAM: Supine dowel exercise   ASSESSMENT:  CLINICAL IMPRESSION: Patient is a 53 y.o. female who was seen today for physical therapy evaluation and treatment for post surgical bilateral mastectomy and post chemotherapy evaluation and treatment .   OBJECTIVE IMPAIRMENTS: decreased endurance, decreased ROM, decreased strength, increased edema, increased fascial restrictions, impaired perceived functional ability, impaired UE functional use, postural dysfunction, and pain.   ACTIVITY LIMITATIONS: carrying, lifting, reach over head, hygiene/grooming, and endurance   PARTICIPATION LIMITATIONS:  will be having radiaiton   PERSONAL FACTORS: 3+ comorbidities: past chemo, liver mets and monitoring, will have radiation  are also affecting patient's functional outcome.   REHAB POTENTIAL: Good  CLINICAL DECISION MAKING: Evolving/moderate complexity  EVALUATION COMPLEXITY: Moderate  GOALS: Goals reviewed with patient? Yes  SHORT TERM GOALS: Target date: 03/20/2024  Right and left shoulder flexion will be 160  Baseline: right is 140, left is 155 Goal status: INITIAL  2.  Pt will be independent in a basic home exercise program for shoulder ROM  Baseline: no knowledge  Goal status: INITIAL   LONG TERM GOALS: Target date: 04/21/2024  Pt will have painfree right shoulder abduction of 170 degrees  Baseline: 135 with pain  Goal status: INITIAL  2.  Pt will be independent in managing trunk fullness  with compression and manual lymph drainage  Baseline: no knowledge Goal status: INITIAL  3.  Pt will be independent in a stregnthening exercises program that she  can do at home  Baseline: no knowledge  Goal status: INITIAL  4.  Pt will decrease Quick Dash score to < 10  Baseline: 40.91 Goal status: INITIAL  5.  Pt will increase 30 second sit to stand to 16 Baseline: 13 Goal status: INITIAL  PLAN:  PT FREQUENCY: 2x/week  PT DURATION: 8 weeks  PLANNED INTERVENTIONS: 97164- PT Re-evaluation, 97750- Physical Performance Testing, 97110-Therapeutic exercises, 97530- Therapeutic activity, 97112- Neuromuscular re-education, 97535- Self Care, [address]- Manual therapy, Patient/Family education, Manual lymph drainage, Scar mobilization, and Compression bandaging  PLAN FOR NEXT SESSION: gentle manual lymph drainage to lateral chest , issue white foam patches for lateral chest. P/AA/AROM to both shoulders. Look at scapular symmetry. Progress exercises   Lafonda Piety. Bevin Bucks PT  Molinda Angelica, PT 02/19/2024, 11:09 AM   Molinda Angelica, PT 02/19/2024, 11:09 AM  [facility] [address] [place], [place], [address] Phone: [phone]   Fax:  [phone]

## 2024-02-19 NOTE — Patient Instructions (Signed)
SHOULDER: Flexion - Supine (Cane)        Cancer Rehab [phone]    Hold cane in both hands. Raise arms up overhead. Do not allow back to arch. Hold _5__ seconds. Do __5-10__ times; __1-2__ times a day.   SELF ASSISTED WITH OBJECT: Shoulder Abduction / Adduction - Supine    Hold cane with both hands. Move both arms from side to side, keep elbows straight.  Hold when stretch felt for __5__ seconds. Repeat __5-10__ times; __1-2__ times a day. Once this becomes easier progress to third picture bringing affected arm towards ear by staying out to side. Same hold for _5_seconds. Repeat  _5-10_ times, _1-2_ times/day.  Shoulder Blade Stretch    Clasp fingers behind head with elbows touching in front of face. Pull elbows back while pressing shoulder blades together. Relax and hold as tolerated, can place pillow under elbow here for comfort as needed and to allow for prolonged stretch.  Repeat __5__ times. Do __1-2__ sessions per day.     SHOULDER: External Rotation - Supine (Cane)    Hold cane with both hands. Rotate arm away from body. Keep elbow on floor and next to body. _5-10__ reps per set, hold 5 seconds, _1-2__ sets per day. Add towel to keep elbow at side.  Copyright  VHI. All rights reserved.       

## 2024-02-19 NOTE — Progress Notes (Signed)
 Radiation Oncology         [phone] ________________________________  Name: Ann Daugherty MRN: [id number]  Date: 02/20/2024  DOB: 03-Jun-1971  Follow-Up Visit Note  Outpatient  CC: Serena Croissant, MD  Serena Croissant, MD  Diagnosis:   No diagnosis found.  1) Left Breast, LOQ, Invasive Ductal Carcinoma, ER+ / PR+ / Her2- / Grade 3   2) Left Breast UOQ, Invasive ductal carcinoma with high-grade DCIS, ER+ / PR+ / Her2-, Grade 2   Cancer Staging  Malignant neoplasm of upper-inner quadrant of left female breast (HCC) Staging form: Breast, AJCC 8th Edition - Clinical: Stage IIIB (cT4b, cN0, cM0, G3, ER+, PR+, HER2-) - Signed by Serena Croissant, MD on 07/26/2023 - Pathologic: Stage IV (pM1) - Signed by Loa Socks, NP on 08/21/2023   CHIEF COMPLAINT: Here to discuss management of multifocal left breast cancer  Narrative:  The patient returns today for follow-up. She was last seen on 07/27/23 for her initial consultation.      Patient decided to proceed with a bilateral mastectomy on date of 01/24/24 under the care of Dr. Almond Lint. Left breast/nodal surgery revealed: two tumor foci. First foci tumor size of 2.8 cm ; histology of grade 3 invasive ductal carcinoma; second tumor foci with tumor size of 1.3 cm and histology of grade 2invasive ductal carcinoma. Ductal carcinoma in situ, intermediate to high-grade with focal necrosis and calcifications. margin status to invasive disease of negative with closest is the posterior margin at 0.6 cm ; nodal status  not examined; ER status: 95%, positive, strong staining intensity; PR status 90%, positive, strong staining, Her2 status negative 1+; Ki-67: 60%.   Right breast biopsy showed fibrocystic change with adenosis, fibroadenomatoid change and calcifications which is negative for carcinoma. Both tumors show a different histomorphology and section of the tissue submitted from between the 2 tumors is negative for in situ or  invasive  carcinoma. Hence, the two tumors are judged to be 2 separate primaries.   Systemic therapy, if applicable, involved breast adjuvant dose dense AC q14d / PACLitaxel q7d initiated on 08/15/23.   Since consultation date, she underwent the following imaging:   --NM whole body on 08/08/23 showing no evidence of metastatic disease.  --CT a/p on 08/01/23 showing round lesion in the posterior right hepatic lobe is highly concerning for metastatic breast carcinoma. No evidence of metastatic adenopathy in the abdomen pelvis. --MRI abdomen on 12/21/23 showing significantly diminished size of hepatic metastases in the anterior liver dome and posterior right lobe of the liver. Additional tiny metastasis not appreciated by prior CT of medial hepatic segment VII, measuring 0.7 cm was also noted. --CT c/a/p on 12/24/23 showing significant decrease in size of left breast masses and resolution of previously seen borderline enlarged left axillary lymph nodes. Scan also noted decreased hepatic metastatic disease and no new or progressive disease identified. --MRI bilateral breast on 12/30/23 showing significant positive response to neoadjuvant chemotherapy. Both left breast malignancies have notably decreased in size  In light of CT findings, she underwent US  liver biopsy on 08/17/23 showing metastatic carcinoma, consistent with breast primary. The tumor cells are positive for GATA3 and are negative SOX10. tumor cells are negative for her2 (0+). estrogen receptor:       positive, 90%, moderate to strong staining; progesterone receptor: positive, 90%, moderate to strong staining; ki-67:  45%          Symptomatically, the patient reports: ***        ALLERGIES:  has no allergies on file.  Meds: Current Outpatient Medications  Medication Sig Dispense Refill   acetaminophen (TYLENOL) 500 MG tablet Take 2 tablets (1,000 mg total) by mouth every 6 (six) hours. 30 tablet 0   Cholecalciferol (VITAMIN D3) 5000 units CAPS Take  5,000 Units by mouth daily.     citalopram (CELEXA) 40 MG tablet Take 1 tablet (40 mg total) by mouth daily. 90 tablet 1   gabapentin (NEURONTIN) 300 MG capsule Take 1 capsule (300 mg total) by mouth 2 (two) times daily. 60 capsule 2   ibuprofen (ADVIL) 800 MG tablet Take 1 tablet (800 mg total) by mouth every 8 (eight) hours as needed. 60 tablet 2   lidocaine-prilocaine (EMLA) cream Apply to affected area once 30 g 3   LORazepam (ATIVAN) 0.5 MG tablet Take 1 tablet (0.5 mg total) by mouth 2 (two) times daily as needed for anxiety. 30 tablet 1   methocarbamol (ROBAXIN) 500 MG tablet Take 1 tablet (500 mg total) by mouth every 6 (six) hours as needed for muscle spasms. 30 tablet 2   oxyCODONE (OXY IR/ROXICODONE) 5 MG immediate release tablet Take 1-2 tablets (5-10 mg total) by mouth every 6 (six) hours as needed for moderate pain (pain score 4-6). 30 tablet 0   senna (SENOKOT) 8.6 MG TABS tablet Take 1 tablet (8.6 mg total) by mouth 2 (two) times daily. 120 tablet 0   tamoxifen (NOLVADEX) 20 MG tablet Take 1 tablet (20 mg total) by mouth daily. Stop 1 week before surgery 30 tablet 1   No current facility-administered medications for this encounter.    Physical Findings:  vitals were not taken for this visit. .     General: Alert and oriented, in no acute distress HEENT: Head is normocephalic. Extraocular movements are intact. Oropharynx is clear. Neck: Neck is supple, no palpable cervical or supraclavicular lymphadenopathy. Heart: Regular in rate and rhythm with no murmurs, rubs, or gallops. Chest: Clear to auscultation bilaterally, with no rhonchi, wheezes, or rales. Abdomen: Soft, nontender, nondistended, with no rigidity or guarding. Extremities: No cyanosis or edema. Lymphatics: see Neck Exam Musculoskeletal: symmetric strength and muscle tone throughout. Neurologic: No obvious focalities. Speech is fluent.  Psychiatric: Judgment and insight are intact. Affect is appropriate. Breast  exam reveals ***  Lab Findings: Lab Results  Component Value Date   WBC 4.0 01/16/2024   HGB 11.9 (L) 01/16/2024   HCT 35.0 (L) 01/16/2024   MCV 93.6 01/16/2024   PLT 241 01/16/2024    @LASTCHEMISTRY @  Radiographic Findings: IR Radiologist Eval & Mgmt Result Date: 01/31/2024 EXAM: NEW PATIENT OFFICE VISIT CHIEF COMPLAINT: SEE NOTE IN EPIC HISTORY OF PRESENT ILLNESS: SEE NOTE IN EPIC REVIEW OF SYSTEMS: SEE NOTE IN EPIC PHYSICAL EXAMINATION: SEE NOTE IN EPIC ASSESSMENT AND PLAN: SEE NOTE IN EPIC Electronically Signed   By: Malachy Moan M.D.   On: 01/31/2024 09:06    Impression/Plan: We discussed adjuvant radiotherapy today.  I recommend *** in order to ***.  I reviewed the logistics, benefits, risks, and potential side effects of this treatment in detail. Risks may include but not necessary be limited to acute and late injury tissue in the radiation fields such as skin irritation (change in color/pigmentation, itching, dryness, pain, peeling). She may experience fatigue. We also discussed possible risk of long term cosmetic changes or scar tissue. There is also a smaller risk for lung toxicity, ***cardiac toxicity, ***brachial plexopathy, ***lymphedema, ***musculoskeletal changes, ***rib fragility or ***induction of a second malignancy, ***late chronic  non-healing soft tissue wound.    The patient asked good questions which I answered to her satisfaction. She is enthusiastic about proceeding with treatment. A consent form has been *** signed and placed in her chart.  A total of *** medically necessary complex treatment devices will be fabricated and supervised by me: *** fields with MLCs for custom blocks to protect heart, and lungs;  and, a Vac-lok. MORE COMPLEX DEVICES MAY BE MADE IN DOSIMETRY FOR FIELD IN FIELD BEAMS FOR DOSE HOMOGENEITY.  I have requested : 3D Simulation which is medically necessary to give adequate dose to at risk tissues while sparing lungs and heart.  I have  requested a DVH of the following structures: lungs, heart, *** lumpectomy cavity.    The patient will receive *** Gy in *** fractions to the *** with *** fields.  This will be *** followed by a boost.  On date of service, in total, I spent *** minutes on this encounter. Patient was seen in person.  _____________________________________   Colie Dawes, MD  This document serves as a record of services personally performed by Colie Dawes, MD. It was created on her behalf by Lucky Sable, a trained medical scribe. The creation of this record is based on the scribe's personal observations and the provider's statements to them. This document has been checked and approved by the attending provider.

## 2024-02-20 ENCOUNTER — Other Ambulatory Visit: Payer: Self-pay

## 2024-02-20 ENCOUNTER — Ambulatory Visit
Admission: RE | Admit: 2024-02-20 | Discharge: 2024-02-20 | Disposition: A | Discharge status: Home / Self Care | Source: Ambulatory Visit | Attending: Radiation Oncology | Admitting: Radiation Oncology

## 2024-02-20 ENCOUNTER — Ambulatory Visit: Admitting: Physical Therapy

## 2024-02-20 ENCOUNTER — Encounter: Payer: Self-pay | Admitting: Radiation Oncology

## 2024-02-20 VITALS — BP 112/56 | HR 63 | Temp 97.4°F | Resp 18 | Ht 66.0 in | Wt 133.8 lb

## 2024-02-20 DIAGNOSIS — Z483 Aftercare following surgery for neoplasm: Secondary | ICD-10-CM

## 2024-02-20 DIAGNOSIS — Z170 Estrogen receptor positive status [ER+]: Secondary | ICD-10-CM | POA: Diagnosis not present

## 2024-02-20 DIAGNOSIS — C787 Secondary malignant neoplasm of liver and intrahepatic bile duct: Secondary | ICD-10-CM | POA: Diagnosis not present

## 2024-02-20 DIAGNOSIS — C50212 Malignant neoplasm of upper-inner quadrant of left female breast: Secondary | ICD-10-CM | POA: Insufficient documentation

## 2024-02-20 DIAGNOSIS — Z79899 Other long term (current) drug therapy: Secondary | ICD-10-CM | POA: Diagnosis not present

## 2024-02-20 DIAGNOSIS — M6281 Muscle weakness (generalized): Secondary | ICD-10-CM | POA: Diagnosis not present

## 2024-02-20 DIAGNOSIS — M25612 Stiffness of left shoulder, not elsewhere classified: Secondary | ICD-10-CM | POA: Diagnosis not present

## 2024-02-20 DIAGNOSIS — M25611 Stiffness of right shoulder, not elsewhere classified: Secondary | ICD-10-CM | POA: Diagnosis not present

## 2024-02-20 DIAGNOSIS — Z79810 Long term (current) use of selective estrogen receptor modulators (SERMs): Secondary | ICD-10-CM | POA: Diagnosis not present

## 2024-02-20 LAB — PREGNANCY, URINE: Preg Test, Ur: NEGATIVE

## 2024-02-20 NOTE — Therapy (Signed)
 [facility] [address] [place], [place], [address] Phone: [phone]   Fax:  [phone]  Patient Details  Name: Ann Daugherty MRN: [id number] Date of Birth: September 20, 1971 Referring Provider:  Almond Lint, MD  Encounter Date: 02/20/2024  OUTPATIENT PHYSICAL THERAPY ONCOLOGY EVALUATION  Patient Name: Ann Daugherty MRN: [id number] DOB:[birth year], 53 y.o., female Today's Date: 02/20/2024  END OF SESSION:  PT End of Session - 02/20/24 1056     Visit Number 2    Number of Visits 17    Date for PT Re-Evaluation 04/20/24    PT Start Time 1000    PT Stop Time 1056    PT Time Calculation (min) 56 min    Activity Tolerance Patient tolerated treatment well    Behavior During Therapy WFL for tasks assessed/performed              Past Medical History:  Diagnosis Date   ADD (attention deficit disorder) 2015   Does not take any medication   Anxiety    Cancer (HCC)    breast   Depression    GERD (gastroesophageal reflux disease)    History of chicken pox    PONV (postoperative nausea and vomiting)    Past Surgical History:  Procedure Laterality Date   BACK SURGERY     BREAST BIOPSY Left 07/19/2023   [place] LT BREAST BX W LOC DEV EA ADD LESION IMG BX SPEC US GUIDE 07/19/2023 [facility]   BREAST BIOPSY Left 07/19/2023   [place] LT BREAST BX W LOC DEV 1ST LESION IMG BX SPEC US GUIDE 07/19/2023 [facility]   BREAST BIOPSY Right 08/09/2023   [place] RT BREAST BX W LOC DEV 1ST LESION IMG BX SPEC US GUIDE 08/09/2023 [facility]   BREAST BIOPSY Right 08/09/2023   [place] RT BREAST BX W LOC DEV EA ADD LESION IMG BX SPEC US GUIDE 08/09/2023 [facility]   IR GUIDED LIVER TUMOR ABLATION RFA PERC     IR RADIOLOGIST EVAL & MGMT  01/08/2024   IR RADIOLOGIST EVAL & MGMT  01/31/2024   LAMINECTOMY     2007, 2011   PORTACATH PLACEMENT Right 08/14/2023   Procedure: INSERTION PORT-A-CATH WITH ULTRASOUND GUIDEANCE;  Surgeon: Almond Lint, MD;  Location: [facility];  Service: General;  Laterality: Right;   SIMPLE MASTECTOMY WITH AXILLARY SENTINEL NODE BIOPSY Bilateral 01/24/2024   Procedure: BILATERAL MASTECTOMY;  Surgeon: Almond Lint, MD;  Location: [facility];  Service: General;  Laterality: Bilateral;  PEC BLOCK   VAGINAL DELIVERY  2004, 2006   Patient Active Problem List   Diagnosis Date Noted   Primary cancer of upper inner quadrant of left breast (HCC) 01/24/2024   Port-A-Cath in place 08/15/2023   Malignant neoplasm of upper-inner quadrant of left female breast (HCC) 07/20/2023   Depression, major, single episode, mild (HCC) 04/05/2021   Tobacco abuse 01/30/2018   Anxiety 01/30/2018   Attention deficit disorder 01/30/2018   Vitamin D deficiency 01/30/2018    PCP:   REFERRING PROVIDER: Almond Lint, MD  REFERRING DIAG: C50.112 (ICD-10-CM) - Malignant neoplasm of central portion of left female breast Z17.0 (ICD-10-CM) - Estrogen receptor positive status (ER+)  THERAPY DIAG:  Aftercare following surgery for neoplasm  Stiffness of left shoulder, not elsewhere classified  Muscle weakness (generalized)  Stiffness of right shoulder, not elsewhere classified  ONSET DATE: 07/2023  Rationale for Evaluation and Treatment: Rehabilitation  SUBJECTIVE:  SUBJECTIVE STATEMENT 02/20/2024: pt comes in with no pain today. She has been working on deep breathing since yesterday She notices more tightness in her right side and axilla   02/19/2024 Pt wants to have the best possible outcome and wants to get through radiation as good as possible  Pt  wants to improve her posture as she is adjusting to the new body image.  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 The 5 o'clock mass had grade 3 invasive ductal carcinoma that is ER/PR positive, her 2 negative, and Ki 67 60%. The 10:30 mass was grade 2 invasive ductal carcinoma, Er/PR +, her 2 negative and Ki 67 30%. Pt had neoadjuvant chemotherapy during which she had about 30# weight loss.  She metastasis to liver and underwent a liver ablation 01/16/2024. Pt had bilataeral mastectomy on 3/20/ with no lymph nodes removed.  She will start radiation this week.    PAIN:  Are you having pain? no  PRECAUTIONS: Other: recent surgery, will be having radiaiton   WEIGHT BEARING RESTRICTIONS: No  FALLS:  Has patient fallen in last 6 months? 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  Mom had had bilateral mastectomies and bilateral knee replacement and colon cancer   HAND DOMINANCE: right   PRIOR LEVEL OF FUNCTION: Independent  PATIENT GOALS: pt want to improve her range of motion and have the best possible outcome from surgery and wants to impove her general strenth    OBJECTIVE: Note: Objective measures were completed at Evaluation unless otherwise noted.  COGNITION: Overall cognitive status: Within functional limits for tasks assessed   PALPATION: Mild fullness at lateral chest below incision on both sides with more on left side   OBSERVATIONS / OTHER ASSESSMENTS: pt with well healing inciscion across front of chest with steristrips still intact in central portion.  Bilateral drain sites with scab intacdt    SENSATION: Pt reports numbness across front of chest and lateral chest   POSTURE: forward head and shoulders . Left ribs rotated forward and prominent in standing. Pt had decreased scapular stabalization on left with scapula moving away from midline with active left shoulder elevation   UPPER EXTREMITY AROM/PROM:  A/PROM RIGHT   eval   Shoulder extension 65  Shoulder flexion 140  Shoulder abduction 135  Shoulder internal rotation   Shoulder external rotation 90    (Blank rows = not tested)  A/PROM LEFT   eval  Shoulder extension 65  Shoulder flexion 155  Shoulder abduction 170  Shoulder internal rotation   Shoulder external rotation 90    (Blank rows = not tested)  CERVICAL AROM: All within normal limits:    UPPER EXTREMITY STRENGTH: shoulders 3-5/5 due to pain from surgery . Otherwise 4+/5 pt reports general weakness from chemo as she feels she has lost muscle mass   LYMPHEDEMA ASSESSMENTS:   SURGERY TYPE/DATE: 01/24/2024 bilateral mastectomy   NUMBER  OF LYMPH NODES REMOVED: 0  CHEMOTHERAPY: yes  RADIATION:yes, will start this week   HORMONE TREATMENT: yes  INFECTIONS: no  LYMPHEDEMA ASSESSMENTS:   LANDMARK RIGHT  eval  10 cm proximal to olecranon process 27  Olecranon process 23  10 cm proximal to ulnar styloid process 19.5  Just proximal to ulnar styloid process 15.3  Across hand at thumb web space 19  At base of 2nd digit 5.8  (Blank rows = not tested)  LANDMARK LEFT  eval  10 cm proximal to olecranon process 27  Olecranon process 24  10 cm proximal to ulnar styloid process 19.7  Just proximal to ulnar styloid process 15.7  Across hand at thumb web space 18.7  At base of 2nd digit 5.8  (Blank rows = not tested)    FUNCTIONAL TESTS:  30 seconds chair stand test  13 stit to stand in 30 seconds from low mat height      QUICK DASH SURVEY: 40.91                                                                                                                              TREATMENT DATE:  02/20/2024:  Lymphatic treatment:Instructed in self MLD and performed MLD: short neck . Superficial abdominals and deep breathing. Left inguinal nodes. Left axillary-inginal anastamosis with use of mirror to monitor pulling on incision. Left axillary nodes. Central chest and moved to doing same sequence on right chest and axilla.  Noted small amount of cording in right axilla ( though pt did not have lymph node removed) that seemed to soften a bit with MLD and soft tissue work to this area.  Gave pt 2 foam patches to wear at lateral chest inside bra at areas of fullness  Exercise: in hook lying: pelvic tilts and supine marches with core stable.  In sitting while wearing compression bra: lymphatic flow series: neck and thoracic and UE ROM with breathing and good postrue.  Wall slides with folded towel   02/19/2024/  Eval perfomed , results, goals and treatment plan discussed. Instructed in supine dowel exercise, Issued amazing breast forms.    PATIENT EDUCATION:  Education details: supine dowel exercises  Person educated: Patient Education method: Explanation, Demonstration, Tactile cues, Verbal cues, and Handouts Education comprehension: verbalized understanding and returned demonstration  HOME EXERCISE PROGRAM: Supine dowel exercise   ASSESSMENT:  CLINICAL IMPRESSION: Patient is a 53 y.o. female who was seen today for physical therapy evaluation and treatment for post surgical bilateral mastectomy and post chemotherapy evaluation and treatment .   OBJECTIVE IMPAIRMENTS: decreased endurance, decreased ROM, decreased strength, increased edema, increased fascial restrictions, impaired perceived functional ability, impaired UE functional use, postural dysfunction, and pain.   ACTIVITY LIMITATIONS: carrying, lifting, reach over head, hygiene/grooming, and endurance   PARTICIPATION LIMITATIONS:  will be having radiaiton   PERSONAL FACTORS: 3+  comorbidities: past chemo, liver mets and monitoring, will have radiation  are also affecting patient's functional  outcome.   REHAB POTENTIAL: Good  CLINICAL DECISION MAKING: Evolving/moderate complexity  EVALUATION COMPLEXITY: Moderate  GOALS: Goals reviewed with patient? Yes  SHORT TERM GOALS: Target date: 03/20/2024  Right and left shoulder flexion will be 160  Baseline: right is 140, left is 155 Goal status: INITIAL  2.  Pt will be independent in a basic home exercise program for shoulder ROM  Baseline: no knowledge  Goal status: INITIAL   LONG TERM GOALS: Target date: 04/21/2024  Pt will have painfree right shoulder abduction of 170 degrees  Baseline: 135 with pain  Goal status: INITIAL  2.  Pt will be independent in managing trunk fullness with compression and manual lymph drainage  Baseline: no knowledge Goal status: INITIAL  3.  Pt will be independent in a stregnthening exercises program that she can do at home  Baseline: no knowledge  Goal status: INITIAL  4.  Pt will decrease Quick Dash score to < 10  Baseline: 40.91 Goal status: INITIAL  5.  Pt will increase 30 second sit to stand to 16 Baseline: 13 Goal status: INITIAL  PLAN:  PT FREQUENCY: 2x/week  PT DURATION: 8 weeks  PLANNED INTERVENTIONS: 97164- PT Re-evaluation, 97750- Physical Performance Testing, 97110-Therapeutic exercises, 97530- Therapeutic activity, 97112- Neuromuscular re-education, 97535- Self Care, 95621- Manual therapy, Patient/Family education, Manual lymph drainage, Scar mobilization, and Compression bandaging  PLAN FOR NEXT SESSION:  focus on right side and cording in right axilla: review gentle manual lymph drainage to lateral chest  How did foam patches work? P/AA/AROM to both shoulders. Look at scapular symmetry. Progress exercises   Lafonda Piety. Ann Daugherty PT  Ann Daugherty, PT 02/20/2024, 10:57 AM   Ann Daugherty, PT 02/20/2024, 10:57 AM  [facility] [address] [place], [place], [address] Phone: [phone]   Fax:  [phone] [address] [place], [place], [address] Phone: [phone]   Fax:  [phone]  Patient Details  Name: KAYLEANN MCCAFFERY MRN: [id number] Date of Birth: 1971/08/24 Referring Provider:  Lockie Rima, MD  Encounter Date: 02/20/2024   Ann Daugherty, PT 02/20/2024, 10:57 AM  [facility] [address] [place], [place], [address] Phone: [phone]   Fax:  [phone]

## 2024-02-20 NOTE — Patient Instructions (Signed)
 [email or web address] Lymphatic flow series with Shoosh Lettick Crotzer     Folded towel up the wall

## 2024-02-21 ENCOUNTER — Inpatient Hospital Stay: Payer: Medicaid Other

## 2024-02-21 ENCOUNTER — Other Ambulatory Visit: Payer: Self-pay

## 2024-02-21 DIAGNOSIS — C50212 Malignant neoplasm of upper-inner quadrant of left female breast: Secondary | ICD-10-CM | POA: Diagnosis not present

## 2024-02-21 DIAGNOSIS — Z170 Estrogen receptor positive status [ER+]: Secondary | ICD-10-CM | POA: Diagnosis not present

## 2024-02-21 DIAGNOSIS — Z95828 Presence of other vascular implants and grafts: Secondary | ICD-10-CM

## 2024-02-21 DIAGNOSIS — Z79810 Long term (current) use of selective estrogen receptor modulators (SERMs): Secondary | ICD-10-CM | POA: Diagnosis not present

## 2024-02-21 DIAGNOSIS — C787 Secondary malignant neoplasm of liver and intrahepatic bile duct: Secondary | ICD-10-CM | POA: Diagnosis not present

## 2024-02-21 MED ORDER — HEPARIN SOD (PORK) LOCK FLUSH 100 UNIT/ML IV SOLN
500.0000 [IU] | Freq: Once | INTRAVENOUS | Status: AC
  Administered 2024-02-21: 500 [IU]

## 2024-02-21 MED ORDER — SODIUM CHLORIDE 0.9% FLUSH
10.0000 mL | Freq: Once | INTRAVENOUS | Status: AC
  Administered 2024-02-21: 10 mL

## 2024-02-21 NOTE — Research (Signed)
 U9811, ICE COMPRESS: RANDOMIZED TRIAL OF LIMB CRYOCOMPRESSION VERSUS CONTINUOUS COMPRESSION VERSUS LOW CYCLIC COMPRESSION FOR THE PREVENTION  OF TAXANE-INDUCED PERIPHERAL NEUROPATHY  Research RN saw pt while she was in flush. Pt was doing well and reported feeling "great" overall. She was happy her hair was growing back and that her energy was returning. She also noted the that her neuropathy has improved. Rn did let pt know that a research appointment had been added on 03-04-24 at 1130 am (after radiation), to get pro's and assessments for week 24 of study. Pt was open to this plan. Pt knows to reach out with any questions or concerns.   Ramonia Burns BSN RN Clinical Research Nurse [facility] Direct Dial: [phone] 02/21/2024  9:05 AM

## 2024-02-25 ENCOUNTER — Ambulatory Visit

## 2024-02-25 DIAGNOSIS — Z483 Aftercare following surgery for neoplasm: Secondary | ICD-10-CM

## 2024-02-25 DIAGNOSIS — M25611 Stiffness of right shoulder, not elsewhere classified: Secondary | ICD-10-CM

## 2024-02-25 DIAGNOSIS — M25612 Stiffness of left shoulder, not elsewhere classified: Secondary | ICD-10-CM

## 2024-02-25 DIAGNOSIS — M6281 Muscle weakness (generalized): Secondary | ICD-10-CM | POA: Diagnosis not present

## 2024-02-25 NOTE — Therapy (Signed)
 [facility] [address] [place], [place], [address] Phone: [phone]   Fax:  [phone]  Patient Details  Name: Ann Daugherty MRN: [id number] Date of Birth: 01-Mar-1971 Referring Provider:  Lockie Rima, MD  Encounter Date: 02/25/2024  OUTPATIENT PHYSICAL THERAPY ONCOLOGY TREATMENT  Patient Name: Ann Daugherty MRN: [id number] DOB:1971-07-22, 53 y.o., female Today's Date: 02/25/2024  END OF SESSION:  PT End of Session - 02/25/24 1103     Visit Number 3    Number of Visits 17    Date for PT Re-Evaluation 04/20/24    PT Start Time 1059    PT Stop Time 1202    PT Time Calculation (min) 63 min    Activity Tolerance Patient tolerated treatment well    Behavior During Therapy WFL for tasks assessed/performed              Past Medical History:  Diagnosis Date   ADD (attention deficit disorder) 2015   Does not take any medication   Anxiety    Cancer (HCC)    breast   Depression    GERD (gastroesophageal reflux disease)    History of chicken pox    PONV (postoperative nausea and vomiting)    Past Surgical History:  Procedure Laterality Date   BACK SURGERY     BREAST BIOPSY Left 07/19/2023   US  LT BREAST BX W LOC DEV EA ADD LESION IMG BX SPEC US  GUIDE 07/19/2023 [facility]   BREAST BIOPSY Left 07/19/2023   US  LT BREAST BX W LOC DEV 1ST LESION IMG BX SPEC US  GUIDE 07/19/2023 [facility]   BREAST BIOPSY Right 08/09/2023   US  RT BREAST BX W LOC DEV 1ST LESION IMG BX SPEC US  GUIDE 08/09/2023 [facility]   BREAST BIOPSY Right 08/09/2023   US  RT BREAST BX W LOC DEV EA ADD LESION IMG BX SPEC US  GUIDE 08/09/2023 [facility]   IR GUIDED LIVER TUMOR ABLATION RFA PERC     IR RADIOLOGIST EVAL & MGMT  01/08/2024   IR RADIOLOGIST EVAL & MGMT  01/31/2024   LAMINECTOMY     2007, 2011   PORTACATH PLACEMENT Right 08/14/2023   Procedure: INSERTION PORT-A-CATH WITH ULTRASOUND GUIDEANCE;  Surgeon: Lockie Rima,  MD;  Location: [facility];  Service: General;  Laterality: Right;   SIMPLE MASTECTOMY WITH AXILLARY SENTINEL NODE BIOPSY Bilateral 01/24/2024   Procedure: BILATERAL MASTECTOMY;  Surgeon: Lockie Rima, MD;  Location: [facility];  Service: General;  Laterality: Bilateral;  PEC BLOCK   VAGINAL DELIVERY  2004, 2006   Patient Active Problem List   Diagnosis Date Noted   Primary cancer of upper inner quadrant of left breast (HCC) 01/24/2024   Port-A-Cath in place 08/15/2023   Malignant neoplasm of upper-inner quadrant of left female breast (HCC) 07/20/2023   Depression, major, single episode, mild (HCC) 04/05/2021   Tobacco abuse 01/30/2018   Anxiety 01/30/2018   Attention deficit disorder 01/30/2018   Vitamin D  deficiency 01/30/2018    PCP:   REFERRING PROVIDER: Lockie Rima, MD  REFERRING DIAG: C50.112 (ICD-10-CM) - Malignant neoplasm of central portion of left female breast Z17.0 (ICD-10-CM) - Estrogen receptor positive status (ER+)  THERAPY DIAG:  Aftercare following surgery for neoplasm  Stiffness of left shoulder, not elsewhere classified  Muscle weakness (generalized)  Stiffness of right shoulder, not elsewhere classified  ONSET DATE: 07/2023  Rationale for Evaluation and Treatment: Rehabilitation  SUBJECTIVE:  SUBJECTIVE STATEMENT I can already tell the stretches are helping. I was even able to play pickle ball for a short time with my son over the weekend. I was sore but it felt good to play with him again.   Eval: Pt wants to have the best possible outcome and wants to get through radiation as good as possible  Pt  wants to improve her posture as she is adjusting to the new body image.  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 The 5 o'clock mass had grade 3 invasive ductal carcinoma that is ER/PR positive, her 2 negative, and Ki 67 60%. The 10:30 mass was grade 2 invasive ductal carcinoma, Er/PR +, her 2 negative and Ki 67 30%. Pt had neoadjuvant chemotherapy during which she had about 30# weight loss.  She metastasis to liver and underwent a liver ablation 01/16/2024. Pt had bilataeral mastectomy on 3/20/ with no lymph nodes removed.  She will start radiation this week.    PAIN:  Are you having pain? no  PRECAUTIONS: Other: recent surgery, will be having radiaiton   WEIGHT BEARING RESTRICTIONS: No  FALLS:  Has patient fallen in last 6 months? 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  Mom had had bilateral mastectomies and bilateral knee replacement and colon cancer   HAND DOMINANCE: right   PRIOR LEVEL OF FUNCTION: Independent  PATIENT GOALS: pt want to improve her range of motion and have the best possible outcome from surgery and wants to impove her general strenth    OBJECTIVE: Note: Objective measures were completed at Evaluation unless otherwise noted.  COGNITION: Overall cognitive status: Within functional limits for tasks assessed   PALPATION: Mild fullness at lateral chest below incision on both sides with more on left side   OBSERVATIONS / OTHER ASSESSMENTS: pt with well healing inciscion across front of chest with steristrips still intact in central portion.  Bilateral drain sites with scab  intacdt   SENSATION: Pt reports numbness across front of chest and lateral chest   POSTURE: forward head and shoulders . Left ribs rotated forward and prominent in standing. Pt had decreased scapular stabalization on left with scapula moving away from midline with active left shoulder elevation   UPPER EXTREMITY AROM/PROM:  A/PROM RIGHT   eval   Shoulder extension 65  Shoulder flexion 140  Shoulder abduction 135  Shoulder internal rotation   Shoulder external rotation 90    (Blank rows = not tested)  A/PROM LEFT   eval  Shoulder extension 65  Shoulder flexion 155  Shoulder abduction 170  Shoulder internal rotation   Shoulder external rotation 90    (Blank rows = not tested)  CERVICAL AROM: All within normal limits:    UPPER EXTREMITY STRENGTH: shoulders 3-5/5 due to pain from surgery . Otherwise 4+/5 pt reports general weakness from chemo as she feels she has lost muscle mass   LYMPHEDEMA  ASSESSMENTS:   SURGERY TYPE/DATE: 01/24/2024 bilateral mastectomy   NUMBER OF LYMPH NODES REMOVED: 0  CHEMOTHERAPY: yes  RADIATION:yes, will start this week   HORMONE TREATMENT: yes  INFECTIONS: no  LYMPHEDEMA ASSESSMENTS:   LANDMARK RIGHT  eval  10 cm proximal to olecranon process 27  Olecranon process 23  10 cm proximal to ulnar styloid process 19.5  Just proximal to ulnar styloid process 15.3  Across hand at thumb web space 19  At base of 2nd digit 5.8  (Blank rows = not tested)  LANDMARK LEFT  eval  10 cm proximal to olecranon process 27  Olecranon process 24  10 cm proximal to ulnar styloid process 19.7  Just proximal to ulnar styloid process 15.7  Across hand at thumb web space 18.7  At base of 2nd digit 5.8  (Blank rows = not tested)    FUNCTIONAL TESTS:  30 seconds chair stand test  13 stit to stand in 30 seconds from low mat height      QUICK DASH SURVEY: 40.91                                                                                                                              TREATMENT DATE:  02/25/24: Therapeutic Exercises Pulleys into flex and abd x 3 mins each, tactile and VC's to decrease bil scapular compensations, then used mirror for visual cuing to decrease scapular compensations. Ball roll up wall into flex x 10, then bil abd x 5 each returning therapist demo Attempt supine of half foam roll but pt reports this max stretch so just laid on foam x 2 mins, then stopped Manual Therapy P/ROM: In supine to Lt shoulder into flex, abd and D2 with scapular depression throughout by therapist, then same to Rt side and spent more time on Rt side MFR to cording that extends from Rt mastectomy incision to axilla and into medial upper arm, also Lt LTR 3x for increased myofascial pull MLD: Short neck, 5 diaphragmatic breaths, Rt inguinal nodes, Rt axillo-inguinal anastomosis, and focused on small area of edema at Rt lateral trunk  02/20/2024:  Lymphatic treatment:Instructed in self MLD and performed MLD: short neck . Superficial abdominals and deep breathing. Left inguinal nodes. Left axillary-inginal anastamosis with use of mirror to monitor pulling on incision. Left axillary nodes. Central chest and moved to doing same sequence on right chest and axilla.  Noted small amount of cording in right axilla ( though pt did not have lymph node removed) that seemed to soften a bit with MLD and soft tissue work to this area.  Gave pt 2 foam patches to wear at lateral chest inside bra at areas of fullness  Exercise: in hook lying: pelvic tilts and supine marches with core stable.  In sitting while wearing compression bra: lymphatic flow series: neck and thoracic and UE ROM with breathing and good postrue.  Wall slides with folded towel   02/19/2024  Eval perfomed ,  results, goals and treatment plan discussed. Instructed in supine dowel exercise, Issued amazing breast forms.    PATIENT EDUCATION:  Education details: supine dowel exercises  Person  educated: Patient Education method: Explanation, Demonstration, Tactile cues, Verbal cues, and Handouts Education comprehension: verbalized understanding and returned demonstration  HOME EXERCISE PROGRAM: Supine dowel exercise   ASSESSMENT:  CLINICAL IMPRESSION: Today progressed pt to include AA/ROM stretches for bil UE shouders. She tolerated these well and reported feeling good stretches with all. Did benefit from VC's to decr scapular compensations and for encouragement not to push into end ROM if she has pain. Trial of supine on half foam roll but too much stretch across incisions so did not cont with this today. Then continued with manual therapy working to decrease fascial restrictions, more so on the Rt side to help improve her end shoulder ROMs. Pt reports feeling looser after session and will benefit from continued stretching and education about posture/correct scapular engagement with A/AA/ROM.     OBJECTIVE IMPAIRMENTS: decreased endurance, decreased ROM, decreased strength, increased edema, increased fascial restrictions, impaired perceived functional ability, impaired UE functional use, postural dysfunction, and pain.   ACTIVITY LIMITATIONS: carrying, lifting, reach over head, hygiene/grooming, and endurance   PARTICIPATION LIMITATIONS:  will be having radiaiton   PERSONAL FACTORS: 3+ comorbidities: past chemo, liver mets and monitoring, will have radiation  are also affecting patient's functional outcome.   REHAB POTENTIAL: Good  CLINICAL DECISION MAKING: Evolving/moderate complexity  EVALUATION COMPLEXITY: Moderate  GOALS: Goals reviewed with patient? Yes  SHORT TERM GOALS: Target date: 03/20/2024  Right and left shoulder flexion will be 160  Baseline: right is 140, left is 155 Goal status: INITIAL  2.  Pt will be independent in a basic home exercise program for shoulder ROM  Baseline: no knowledge  Goal status: INITIAL   LONG TERM GOALS: Target date:  04/21/2024  Pt will have painfree right shoulder abduction of 170 degrees  Baseline: 135 with pain  Goal status: INITIAL  2.  Pt will be independent in managing trunk fullness with compression and manual lymph drainage  Baseline: no knowledge Goal status: INITIAL  3.  Pt will be independent in a stregnthening exercises program that she can do at home  Baseline: no knowledge  Goal status: INITIAL  4.  Pt will decrease Quick Dash score to < 10  Baseline: 40.91 Goal status: INITIAL  5.  Pt will increase 30 second sit to stand to 16 Baseline: 13 Goal status: INITIAL  PLAN:  PT FREQUENCY: 2x/week  PT DURATION: 8 weeks  PLANNED INTERVENTIONS: 97164- PT Re-evaluation, 97750- Physical Performance Testing, 97110-Therapeutic exercises, 97530- Therapeutic activity, 97112- Neuromuscular re-education, 97535- Self Care, 46962- Manual therapy, Patient/Family education, Manual lymph drainage, Scar mobilization, and Compression bandaging  PLAN FOR NEXT SESSION:  focus on right side and cording in right axilla: review gentle manual lymph drainage to lateral chest  How did foam patches work? P/AA/AROM to both shoulders. Look at scapular symmetry. Progress exercises and later instruct in Strength ABC Program.   Lafonda Piety. Bevin Bucks PT  Denyce Flank, PTA 02/25/2024, 12:08 PM   Denyce Flank, PTA 02/25/2024, 12:08 PM  [facility] [address] [place], [place], [address] Phone: [phone]   Fax:  [phone]   Patient Details  Name: Ann Daugherty MRN: [id number] Date of Birth: Jan 08, 1971 Referring Provider:  Lockie Rima, MD  Encounter Date: 02/25/2024

## 2024-02-26 ENCOUNTER — Encounter: Payer: Self-pay | Admitting: *Deleted

## 2024-02-26 DIAGNOSIS — C50212 Malignant neoplasm of upper-inner quadrant of left female breast: Secondary | ICD-10-CM

## 2024-02-28 ENCOUNTER — Telehealth: Payer: Self-pay | Admitting: Hematology and Oncology

## 2024-02-28 NOTE — Telephone Encounter (Signed)
 I left a detailed message of the patient's appointment details. I informed the patient to call back if appointments do not work.

## 2024-02-29 ENCOUNTER — Other Ambulatory Visit: Payer: Self-pay

## 2024-02-29 DIAGNOSIS — C50412 Malignant neoplasm of upper-outer quadrant of left female breast: Secondary | ICD-10-CM | POA: Diagnosis not present

## 2024-02-29 DIAGNOSIS — Z87891 Personal history of nicotine dependence: Secondary | ICD-10-CM | POA: Diagnosis not present

## 2024-02-29 DIAGNOSIS — C50512 Malignant neoplasm of lower-outer quadrant of left female breast: Secondary | ICD-10-CM | POA: Insufficient documentation

## 2024-02-29 DIAGNOSIS — C50212 Malignant neoplasm of upper-inner quadrant of left female breast: Secondary | ICD-10-CM | POA: Diagnosis not present

## 2024-02-29 DIAGNOSIS — Z510 Encounter for antineoplastic radiation therapy: Secondary | ICD-10-CM | POA: Insufficient documentation

## 2024-02-29 DIAGNOSIS — Z170 Estrogen receptor positive status [ER+]: Secondary | ICD-10-CM | POA: Insufficient documentation

## 2024-03-04 ENCOUNTER — Other Ambulatory Visit: Payer: Self-pay

## 2024-03-04 ENCOUNTER — Inpatient Hospital Stay

## 2024-03-04 ENCOUNTER — Ambulatory Visit
Admission: RE | Admit: 2024-03-04 | Discharge: 2024-03-04 | Disposition: A | Discharge status: Home / Self Care | Source: Ambulatory Visit | Attending: Radiation Oncology | Admitting: Radiation Oncology

## 2024-03-04 DIAGNOSIS — Z170 Estrogen receptor positive status [ER+]: Secondary | ICD-10-CM

## 2024-03-04 DIAGNOSIS — C50412 Malignant neoplasm of upper-outer quadrant of left female breast: Secondary | ICD-10-CM | POA: Diagnosis not present

## 2024-03-04 DIAGNOSIS — Z87891 Personal history of nicotine dependence: Secondary | ICD-10-CM | POA: Diagnosis not present

## 2024-03-04 DIAGNOSIS — C50512 Malignant neoplasm of lower-outer quadrant of left female breast: Secondary | ICD-10-CM | POA: Diagnosis not present

## 2024-03-04 DIAGNOSIS — Z510 Encounter for antineoplastic radiation therapy: Secondary | ICD-10-CM | POA: Diagnosis not present

## 2024-03-04 LAB — RAD ONC ARIA SESSION SUMMARY

## 2024-03-04 NOTE — Research (Signed)
 A2130, ICE COMPRESS: RANDOMIZED TRIAL OF LIMB CRYOCOMPRESSION VERSUS CONTINUOUS COMPRESSION VERSUS LOW CYCLIC COMPRESSION FOR THE PREVENTION  OF TAXANE-INDUCED PERIPHERAL NEUROPATHY   Patient arrives today Unaccompanied for her week 24 follow up. Confirmed patient does not have wounds, sores, or lesions to extremities. Patient has not had any vaccinations since last visit.   ADVERSE EVENTS: Patient reports no adverse events from study intervention (Paxman machine). Pt denies any motor neuropathy symptoms, but reports ongoing sensory neuropathy symptoms in her feet bilaterally (right worse than left). Pt reports the numbness remains in her middle toes but this has improved. Patient reports it feels like a "sock in stuck" on the three middle toes bilaterally on both feet. Pt also reports that this sensory neuropathy does not interfere with her everyday life function.  Dr. Lee Public previously stated the sensory neuropathy was not related to the paxman machine (study intervention) but is probably related to the Taxol . RN assessed pt hands and feet bilaterally, and they had normal color, temperature, and sensation.    PRO's:  Pt 24 week PROs completed.    Assessments:  Pt 24 week assessments (nueropen, tuning fork, get up and go) were completed.  SOLICITED ADVERSE EVENTS:    Adverse Event CTCAE Grade Relationship to Study Intervention Onset Resolved Action Taken Relationship to chemotherapy Comments  Skin atrophy 0 NA            Skin hyperpigmentation 0 NA            Skin hypopigmentation 0 NA            Skin induration 0 NA            Skin ulceration 0 NA            Rash maculopapular 0 NA            Nail changes 0 NA            Cold intolerance (general disorders and administration site conditions- other) 0 NA            Frostbite (skin and subcutaneous tissue disorders- other) 0 NA            Fatigue 1 Not related to paxman  machine     none   Pt reports mild fatigue today. Stating she is  still able to perform everyday task but does need intermittent rest periods.   Peripheral Sensory Neuropathy   1 Not related to [employer].  11-27-23 per pt report   Paclitaxel  dose reduced on 11/28/23. Probably related to Taxol  per Dr. Lee Public.  Pt reports this mild numbness and tingling (middle toes of both feet) does not interfere with her daily function of ADL's and just "feels funny".     Disposition:  All study assessments completed and pt was walked to door by [employer]. Pt had no question or concerns at this time. Pt knows she can call research staff with any concerns. Research RN will follow-up with pt for her 52 week check. RN will call her with this appointment.   Ramonia Burns BSN RN Clinical Research Nurse [facility] Direct Dial: [phone] 03/04/2024  12:29 PM

## 2024-03-05 ENCOUNTER — Other Ambulatory Visit: Payer: Self-pay

## 2024-03-05 ENCOUNTER — Ambulatory Visit
Admission: RE | Admit: 2024-03-05 | Discharge: 2024-03-05 | Disposition: A | Discharge status: Home / Self Care | Source: Ambulatory Visit | Attending: Radiation Oncology | Admitting: Radiation Oncology

## 2024-03-05 DIAGNOSIS — Z87891 Personal history of nicotine dependence: Secondary | ICD-10-CM | POA: Diagnosis not present

## 2024-03-05 DIAGNOSIS — Z510 Encounter for antineoplastic radiation therapy: Secondary | ICD-10-CM | POA: Diagnosis not present

## 2024-03-05 DIAGNOSIS — C50512 Malignant neoplasm of lower-outer quadrant of left female breast: Secondary | ICD-10-CM | POA: Diagnosis not present

## 2024-03-05 DIAGNOSIS — C50412 Malignant neoplasm of upper-outer quadrant of left female breast: Secondary | ICD-10-CM | POA: Diagnosis not present

## 2024-03-05 DIAGNOSIS — Z170 Estrogen receptor positive status [ER+]: Secondary | ICD-10-CM | POA: Diagnosis not present

## 2024-03-05 LAB — RAD ONC ARIA SESSION SUMMARY
Course Elapsed Days: 1
Plan Fractions Treated to Date: 1
Plan Fractions Treated to Date: 2
Plan Prescribed Dose Per Fraction: 2 Gy
Plan Prescribed Dose Per Fraction: 2 Gy
Plan Total Fractions Prescribed: 12
Plan Total Fractions Prescribed: 25
Plan Total Prescribed Dose: 24 Gy
Plan Total Prescribed Dose: 50 Gy
Reference Point Dosage Given to Date: 2 Gy
Reference Point Dosage Given to Date: 4 Gy
Reference Point Session Dosage Given: 2 Gy
Reference Point Session Dosage Given: 2 Gy
Session Number: 2

## 2024-03-06 ENCOUNTER — Other Ambulatory Visit: Payer: Self-pay

## 2024-03-06 ENCOUNTER — Ambulatory Visit
Admission: RE | Admit: 2024-03-06 | Discharge: 2024-03-06 | Disposition: A | Discharge status: Home / Self Care | Source: Ambulatory Visit | Attending: Radiation Oncology | Admitting: Radiation Oncology

## 2024-03-06 DIAGNOSIS — C50212 Malignant neoplasm of upper-inner quadrant of left female breast: Secondary | ICD-10-CM | POA: Insufficient documentation

## 2024-03-06 LAB — RAD ONC ARIA SESSION SUMMARY
Course Elapsed Days: 2
Plan Fractions Treated to Date: 2
Plan Fractions Treated to Date: 3
Plan Prescribed Dose Per Fraction: 2 Gy
Plan Prescribed Dose Per Fraction: 2 Gy
Plan Total Fractions Prescribed: 13
Plan Total Fractions Prescribed: 25
Plan Total Prescribed Dose: 26 Gy
Plan Total Prescribed Dose: 50 Gy
Reference Point Dosage Given to Date: 4 Gy
Reference Point Dosage Given to Date: 6 Gy
Reference Point Session Dosage Given: 2 Gy
Reference Point Session Dosage Given: 2 Gy
Session Number: 3

## 2024-03-07 ENCOUNTER — Other Ambulatory Visit: Payer: Self-pay

## 2024-03-07 ENCOUNTER — Ambulatory Visit
Admission: RE | Admit: 2024-03-07 | Discharge: 2024-03-07 | Disposition: A | Discharge status: Home / Self Care | Source: Ambulatory Visit | Attending: Radiation Oncology

## 2024-03-07 DIAGNOSIS — C50212 Malignant neoplasm of upper-inner quadrant of left female breast: Secondary | ICD-10-CM | POA: Diagnosis not present

## 2024-03-07 LAB — RAD ONC ARIA SESSION SUMMARY
Course Elapsed Days: 3
Plan Fractions Treated to Date: 2
Plan Fractions Treated to Date: 4
Plan Prescribed Dose Per Fraction: 2 Gy
Plan Prescribed Dose Per Fraction: 2 Gy
Plan Total Fractions Prescribed: 12
Plan Total Fractions Prescribed: 25
Plan Total Prescribed Dose: 24 Gy
Plan Total Prescribed Dose: 50 Gy
Reference Point Dosage Given to Date: 4 Gy
Reference Point Dosage Given to Date: 8 Gy
Reference Point Session Dosage Given: 2 Gy
Reference Point Session Dosage Given: 2 Gy
Session Number: 4

## 2024-03-10 ENCOUNTER — Other Ambulatory Visit: Payer: Self-pay | Admitting: Hematology and Oncology

## 2024-03-10 ENCOUNTER — Other Ambulatory Visit: Payer: Self-pay

## 2024-03-10 ENCOUNTER — Ambulatory Visit
Admission: RE | Admit: 2024-03-10 | Discharge: 2024-03-10 | Disposition: A | Discharge status: Home / Self Care | Source: Ambulatory Visit | Attending: Radiation Oncology

## 2024-03-10 ENCOUNTER — Ambulatory Visit
Admission: RE | Admit: 2024-03-10 | Discharge: 2024-03-10 | Disposition: A | Discharge status: Home / Self Care | Source: Ambulatory Visit | Attending: Radiation Oncology | Admitting: Radiation Oncology

## 2024-03-10 ENCOUNTER — Ambulatory Visit: Attending: General Surgery

## 2024-03-10 ENCOUNTER — Other Ambulatory Visit (HOSPITAL_COMMUNITY): Payer: Self-pay

## 2024-03-10 DIAGNOSIS — Z510 Encounter for antineoplastic radiation therapy: Secondary | ICD-10-CM | POA: Diagnosis not present

## 2024-03-10 DIAGNOSIS — Z483 Aftercare following surgery for neoplasm: Secondary | ICD-10-CM | POA: Diagnosis not present

## 2024-03-10 DIAGNOSIS — M6281 Muscle weakness (generalized): Secondary | ICD-10-CM | POA: Insufficient documentation

## 2024-03-10 DIAGNOSIS — M25612 Stiffness of left shoulder, not elsewhere classified: Secondary | ICD-10-CM | POA: Insufficient documentation

## 2024-03-10 DIAGNOSIS — M25611 Stiffness of right shoulder, not elsewhere classified: Secondary | ICD-10-CM | POA: Diagnosis not present

## 2024-03-10 DIAGNOSIS — C50212 Malignant neoplasm of upper-inner quadrant of left female breast: Secondary | ICD-10-CM | POA: Diagnosis not present

## 2024-03-10 DIAGNOSIS — Z170 Estrogen receptor positive status [ER+]: Secondary | ICD-10-CM | POA: Diagnosis not present

## 2024-03-10 LAB — RAD ONC ARIA SESSION SUMMARY
Course Elapsed Days: 6
Plan Fractions Treated to Date: 3
Plan Fractions Treated to Date: 5
Plan Prescribed Dose Per Fraction: 2 Gy
Plan Prescribed Dose Per Fraction: 2 Gy
Plan Total Fractions Prescribed: 13
Plan Total Fractions Prescribed: 25
Plan Total Prescribed Dose: 26 Gy
Plan Total Prescribed Dose: 50 Gy
Reference Point Dosage Given to Date: 10 Gy
Reference Point Dosage Given to Date: 6 Gy
Reference Point Session Dosage Given: 2 Gy
Reference Point Session Dosage Given: 2 Gy
Session Number: 5

## 2024-03-10 MED ORDER — ALRA NON-METALLIC DEODORANT (RAD-ONC): 1.0000 | Freq: Once | TOPICAL | Status: DC

## 2024-03-10 MED ORDER — TAMOXIFEN CITRATE 20 MG PO TABS
20.0000 mg | ORAL_TABLET | Freq: Every day | ORAL | 1 refills | Status: DC
  Filled 2024-03-10: qty 30, 30d supply, fill #0
  Filled 2024-04-08: qty 30, 30d supply, fill #1

## 2024-03-10 MED ORDER — ALRA NON-METALLIC DEODORANT (RAD-ONC)
1.0000 | Freq: Once | TOPICAL | Status: AC
  Administered 2024-03-10: 1 via TOPICAL

## 2024-03-10 MED ORDER — RADIAPLEXRX EX GEL: Freq: Once | CUTANEOUS | Status: AC

## 2024-03-10 MED ORDER — RADIAPLEXRX EX GEL: Freq: Once | CUTANEOUS | Status: DC

## 2024-03-10 NOTE — Patient Instructions (Addendum)
 Ann Daugherty

## 2024-03-10 NOTE — Therapy (Signed)
 [facility] [address] [place], [place], [address] Phone: [phone]   Fax:  [phone]  Patient Details  Name: Ann Daugherty MRN: [id number] Date of Birth: 1971-07-03 Referring Provider:  Lockie Rima, MD  Encounter Date: 03/10/2024  OUTPATIENT PHYSICAL THERAPY ONCOLOGY TREATMENT  Patient Name: Ann Daugherty MRN: [id number] DOB:06-Feb-1971, 53 y.o., female Today's Date: 03/10/2024  END OF SESSION:  PT End of Session - 03/10/24 1209     Visit Number 4    Number of Visits 17    Date for PT Re-Evaluation 04/20/24    PT Start Time 1206    PT Stop Time 1302    PT Time Calculation (min) 56 min    Activity Tolerance Patient tolerated treatment well    Behavior During Therapy WFL for tasks assessed/performed              Past Medical History:  Diagnosis Date   ADD (attention deficit disorder) 2015   Does not take any medication   Anxiety    Cancer (HCC)    breast   Depression    GERD (gastroesophageal reflux disease)    History of chicken pox    PONV (postoperative nausea and vomiting)    Past Surgical History:  Procedure Laterality Date   BACK SURGERY     BREAST BIOPSY Left 07/19/2023   US  LT BREAST BX W LOC DEV EA ADD LESION IMG BX SPEC US  GUIDE 07/19/2023 [facility]   BREAST BIOPSY Left 07/19/2023   US  LT BREAST BX W LOC DEV 1ST LESION IMG BX SPEC US  GUIDE 07/19/2023 [facility]   BREAST BIOPSY Right 08/09/2023   US  RT BREAST BX W LOC DEV 1ST LESION IMG BX SPEC US  GUIDE 08/09/2023 [facility]   BREAST BIOPSY Right 08/09/2023   US  RT BREAST BX W LOC DEV EA ADD LESION IMG BX SPEC US  GUIDE 08/09/2023 [facility]   IR GUIDED LIVER TUMOR ABLATION RFA PERC     IR RADIOLOGIST EVAL & MGMT  01/08/2024   IR RADIOLOGIST EVAL & MGMT  01/31/2024   LAMINECTOMY     2007, 2011   PORTACATH PLACEMENT Right 08/14/2023   Procedure: INSERTION PORT-A-CATH WITH ULTRASOUND GUIDEANCE;  Surgeon: Lockie Rima,  MD;  Location: [facility];  Service: General;  Laterality: Right;   SIMPLE MASTECTOMY WITH AXILLARY SENTINEL NODE BIOPSY Bilateral 01/24/2024   Procedure: BILATERAL MASTECTOMY;  Surgeon: Lockie Rima, MD;  Location: [facility];  Service: General;  Laterality: Bilateral;  PEC BLOCK   VAGINAL DELIVERY  2004, 2006   Patient Active Problem List   Diagnosis Date Noted   Primary cancer of upper inner quadrant of left breast (HCC) 01/24/2024   Port-A-Cath in place 08/15/2023   Malignant neoplasm of upper-inner quadrant of left female breast (HCC) 07/20/2023   Depression, major, single episode, mild (HCC) 04/05/2021   Tobacco abuse 01/30/2018   Anxiety 01/30/2018   Attention deficit disorder 01/30/2018   Vitamin D  deficiency 01/30/2018    PCP:   REFERRING PROVIDER: Lockie Rima, MD  REFERRING DIAG: C50.112 (ICD-10-CM) - Malignant neoplasm of central portion of left female breast Z17.0 (ICD-10-CM) - Estrogen receptor positive status (ER+)  THERAPY DIAG:  Aftercare following surgery for neoplasm  Stiffness of left shoulder, not elsewhere classified  Muscle weakness (generalized)  Stiffness of right shoulder, not elsewhere classified  ONSET DATE: 07/2023  Rationale for Evaluation and Treatment: Rehabilitation  SUBJECTIVE:  SUBJECTIVE STATEMENT The foam she gave me is helping me and I can tell when I don't wear the compression because the swelling gets worse, more so in my Rt breast surprisingly.   Eval: Pt wants to have the best possible outcome and wants to get through radiation as good as possible  Pt  wants to improve her posture as she is adjusting to the new body image.  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 The 5 o'clock mass had grade 3 invasive ductal carcinoma that is ER/PR positive, her 2 negative, and Ki 67 60%. The 10:30 mass was grade 2 invasive ductal carcinoma, Er/PR +, her 2 negative and Ki 67 30%. Pt had neoadjuvant chemotherapy during which she had about 30# weight loss.  She metastasis to liver and underwent a liver ablation 01/16/2024. Pt had bilataeral mastectomy on 3/20/ with no lymph nodes removed.  She will start radiation this week.    PAIN:  Are you having pain? no  PRECAUTIONS: Other: recent surgery, will be having radiaiton   WEIGHT BEARING RESTRICTIONS: No  FALLS:  Has patient fallen in last 6 months? 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  Mom had had bilateral mastectomies and bilateral knee replacement and colon cancer   HAND DOMINANCE: right   PRIOR LEVEL OF FUNCTION: Independent  PATIENT GOALS: pt want to improve her range of motion and have the best possible outcome from surgery and wants to impove her general strenth    OBJECTIVE: Note: Objective measures were completed at Evaluation unless otherwise noted.  COGNITION: Overall cognitive status: Within functional limits for tasks assessed   PALPATION: Mild fullness at lateral chest below incision on both sides with more on left side   OBSERVATIONS / OTHER ASSESSMENTS: pt with well healing inciscion across front of chest with steristrips still intact in central portion.  Bilateral drain sites with scab intacdt   SENSATION: Pt  reports numbness across front of chest and lateral chest   POSTURE: forward head and shoulders . Left ribs rotated forward and prominent in standing. Pt had decreased scapular stabalization on left with scapula moving away from midline with active left shoulder elevation   UPPER EXTREMITY AROM/PROM:  A/PROM RIGHT   eval   Shoulder extension 65  Shoulder flexion 140  Shoulder abduction 135  Shoulder internal rotation   Shoulder external rotation 90    (Blank rows = not tested)  A/PROM LEFT   eval  Shoulder extension 65  Shoulder flexion 155  Shoulder abduction 170  Shoulder internal rotation   Shoulder external rotation 90    (Blank rows = not tested)  CERVICAL AROM: All within normal limits:    UPPER EXTREMITY STRENGTH: shoulders 3-5/5 due to pain from surgery . Otherwise 4+/5 pt reports general weakness from chemo as she feels she has lost muscle mass   LYMPHEDEMA ASSESSMENTS:   SURGERY TYPE/DATE: 01/24/2024 bilateral mastectomy  NUMBER OF LYMPH NODES REMOVED: 0  CHEMOTHERAPY: yes  RADIATION:yes, will start this week   HORMONE TREATMENT: yes  INFECTIONS: no  LYMPHEDEMA ASSESSMENTS:   LANDMARK RIGHT  eval  10 cm proximal to olecranon process 27  Olecranon process 23  10 cm proximal to ulnar styloid process 19.5  Just proximal to ulnar styloid process 15.3  Across hand at thumb web space 19  At base of 2nd digit 5.8  (Blank rows = not tested)  LANDMARK LEFT  eval  10 cm proximal to olecranon process 27  Olecranon process 24  10 cm proximal to ulnar styloid process 19.7  Just proximal to ulnar styloid process 15.7  Across hand at thumb web space 18.7  At base of 2nd digit 5.8  (Blank rows = not tested)    FUNCTIONAL TESTS:  30 seconds chair stand test  13 stit to stand in 30 seconds from low mat height      QUICK DASH SURVEY: 40.91                                                                                                                              TREATMENT DATE:  03/10/24: Therapeutic Exercises Pulleys into flex and abd x 2 mins each reminding pt to decrease bil scapular compensations with end range stretches Roll yellow ball up wall into flex and abd x 10 each Therapeutic Activities Supine over full foam roll for supine scapular series with yellow theraband x 10 each returning therapist demo for each and VC's during for core engagement for increased stability Manual Therapy P/ROM: In supine to Lt shoulder into flex, abd and D2 with scapular depression throughout, then same to Rt side and spent more time on Rt side MFR to cording that extends from Rt mastectomy incision to axilla and into medial upper arm with prolonged holds when Rt UE in end range stretch and relaxed on mat STM to each pectoralis insertion where palpably tight, avoiding area of port on Rt side MLD: Short neck, 5 diaphragmatic breaths, Rt inguinal nodes, Rt axillo-inguinal anastomosis, and focused on small area of edema at Rt lateral trunk Kinesiotape trial along Rt axillo-inguinal anastomosis with bucket inferior to transverse watershed and 3 fingers running up to edema at lateral trunk (2 fingers inferior and 1 superior to mastectomy incision). Pt was instructed on doffing after 2-3 days or when edges begin to unfurl and to assess skin after.    02/25/24: Therapeutic Exercises Pulleys into flex and abd x 3 mins each, tactile and VC's to decrease bil scapular compensations, then used mirror for visual cuing to decrease scapular compensations. Ball roll up wall into flex x 10, then bil abd x 5 each returning therapist demo Attempt supine of half foam roll but pt reports this max stretch so just laid on foam x 2 mins, then stopped Manual Therapy P/ROM: In supine to Lt shoulder into flex, abd and D2 with scapular depression throughout by  therapist, then same to Rt side and spent more time on Rt side MFR to cording that extends from Rt mastectomy incision to  axilla and into medial upper arm, also Lt LTR 3x for increased myofascial pull MLD: Short neck, 5 diaphragmatic breaths, Rt inguinal nodes, Rt axillo-inguinal anastomosis, and focused on small area of edema at Rt lateral trunk  02/20/2024:  Lymphatic treatment:Instructed in self MLD and performed MLD: short neck . Superficial abdominals and deep breathing. Left inguinal nodes. Left axillary-inginal anastamosis with use of mirror to monitor pulling on incision. Left axillary nodes. Central chest and moved to doing same sequence on right chest and axilla.  Noted small amount of cording in right axilla ( though pt did not have lymph node removed) that seemed to soften a bit with MLD and soft tissue work to this area.  Gave pt 2 foam patches to wear at lateral chest inside bra at areas of fullness  Exercise: in hook lying: pelvic tilts and supine marches with core stable.  In sitting while wearing compression bra: lymphatic flow series: neck and thoracic and UE ROM with breathing and good postrue.  Wall slides with folded towel     PATIENT EDUCATION:  Education details: supine scapular series with yellow theraband Person educated: Patient Education method: Explanation, Demonstration, Tactile cues, Verbal cues, and Handouts Education comprehension: verbalized understanding and returned demonstration and VC's during  HOME EXERCISE PROGRAM: Supine dowel exercise Supine scapular series   ASSESSMENT:  CLINICAL IMPRESSION: Continued with AA/ROM end ROM stretching and progressed pt to include postural strength with supine scap series. Pt reports feeling challenged by these and good stretches also felt. Then continued with manual therapy working to decrease end ROM tightness and trial of kinesiotape to Rt lateral trunk.     OBJECTIVE IMPAIRMENTS: decreased endurance, decreased ROM, decreased strength, increased edema, increased fascial restrictions, impaired perceived functional ability, impaired UE  functional use, postural dysfunction, and pain.   ACTIVITY LIMITATIONS: carrying, lifting, reach over head, hygiene/grooming, and endurance   PARTICIPATION LIMITATIONS:  will be having radiaiton   PERSONAL FACTORS: 3+ comorbidities: past chemo, liver mets and monitoring, will have radiation  are also affecting patient's functional outcome.   REHAB POTENTIAL: Good  CLINICAL DECISION MAKING: Evolving/moderate complexity  EVALUATION COMPLEXITY: Moderate  GOALS: Goals reviewed with patient? Yes  SHORT TERM GOALS: Target date: 03/20/2024  Right and left shoulder flexion will be 160  Baseline: right is 140, left is 155 Goal status: INITIAL  2.  Pt will be independent in a basic home exercise program for shoulder ROM  Baseline: no knowledge  Goal status: INITIAL   LONG TERM GOALS: Target date: 04/21/2024  Pt will have painfree right shoulder abduction of 170 degrees  Baseline: 135 with pain  Goal status: INITIAL  2.  Pt will be independent in managing trunk fullness with compression and manual lymph drainage  Baseline: no knowledge Goal status: INITIAL  3.  Pt will be independent in a stregnthening exercises program that she can do at home  Baseline: no knowledge  Goal status: INITIAL  4.  Pt will decrease Quick Dash score to < 10  Baseline: 40.91 Goal status: INITIAL  5.  Pt will increase 30 second sit to stand to 16 Baseline: 13 Goal status: INITIAL  PLAN:  PT FREQUENCY: 2x/week  PT DURATION: 8 weeks  PLANNED INTERVENTIONS: 97164- PT Re-evaluation, 97750- Physical Performance Testing, 97110-Therapeutic exercises, 97530- Therapeutic activity, [id number]- Neuromuscular re-education, 97535- Self Care, [address]- Manual therapy, Patient/Family education, Manual  lymph drainage, Scar mobilization, and Compression bandaging  PLAN FOR NEXT SESSION:  focus on right side and cording in right axilla; review gentle manual lymph drainage to lateral chest  How did foam patches work?  P/AA/AROM to both shoulders. Look at scapular symmetry. Progress exercises and later instruct in Strength ABC Program.    Denyce Flank, PTA 03/10/2024, 1:12 PM   Denyce Flank, PTA 03/10/2024, 1:12 PM  [facility] [address] [place], [place], [address] Phone: [phone]   Fax:  [phone]   Patient Details  Name: JALI COCKFIELD MRN: [id number] Date of Birth: Mar 10, 1971 Referring Provider:  Lockie Rima, MD  Encounter Date: 03/10/2024  Over Head Pull: Narrow and Wide Grip   Cancer Rehab [phone]   On back, knees bent, feet flat, band across thighs, elbows straight but relaxed. Pull hands apart (start). Keeping elbows straight, bring arms up and over head, hands toward floor. Keep pull steady on band. Hold momentarily. Return slowly, keeping pull steady, back to start. Then do same with a wider grip on the band (past shoulder width) Repeat _5-10__ times. Band color __yellow____   Side Pull: Double Arm   On back, knees bent, feet flat. Arms perpendicular to body, shoulder level, elbows straight but relaxed. Pull arms out to sides, elbows straight. Resistance band comes across collarbones, hands toward floor. Hold momentarily. Slowly return to starting position. Repeat _5-10__ times. Band color _yellow____   Sword   On back, knees bent, feet flat, left hand on left hip, right hand above left. Pull right arm DIAGONALLY (hip to shoulder) across chest. Bring right arm along head toward floor. Hold momentarily. Slowly return to starting position. Repeat _5-10__ times. Do with left arm. Band color _yellow_____   Shoulder Rotation: Double Arm   On back, knees bent, feet flat, elbows tucked at sides, bent 90, hands palms up. Pull hands apart and down toward floor, keeping elbows near sides. Hold momentarily. Slowly return to starting position. Repeat _5-10__ times. Band color __yellow____

## 2024-03-11 ENCOUNTER — Other Ambulatory Visit: Payer: Self-pay

## 2024-03-11 ENCOUNTER — Ambulatory Visit
Admission: RE | Admit: 2024-03-11 | Discharge: 2024-03-11 | Disposition: A | Discharge status: Home / Self Care | Source: Ambulatory Visit | Attending: Radiation Oncology

## 2024-03-11 DIAGNOSIS — C50212 Malignant neoplasm of upper-inner quadrant of left female breast: Secondary | ICD-10-CM | POA: Diagnosis not present

## 2024-03-11 LAB — RAD ONC ARIA SESSION SUMMARY
Course Elapsed Days: 7
Plan Fractions Treated to Date: 3
Plan Fractions Treated to Date: 6
Plan Prescribed Dose Per Fraction: 2 Gy
Plan Prescribed Dose Per Fraction: 2 Gy
Plan Total Fractions Prescribed: 12
Plan Total Fractions Prescribed: 25
Plan Total Prescribed Dose: 24 Gy
Plan Total Prescribed Dose: 50 Gy
Reference Point Dosage Given to Date: 12 Gy
Reference Point Dosage Given to Date: 6 Gy
Reference Point Session Dosage Given: 2 Gy
Reference Point Session Dosage Given: 2 Gy
Session Number: 6

## 2024-03-12 ENCOUNTER — Ambulatory Visit
Admission: RE | Admit: 2024-03-12 | Discharge: 2024-03-12 | Disposition: A | Discharge status: Home / Self Care | Source: Ambulatory Visit | Attending: Radiation Oncology

## 2024-03-12 ENCOUNTER — Other Ambulatory Visit: Payer: Self-pay

## 2024-03-12 DIAGNOSIS — C50212 Malignant neoplasm of upper-inner quadrant of left female breast: Secondary | ICD-10-CM | POA: Diagnosis not present

## 2024-03-12 LAB — RAD ONC ARIA SESSION SUMMARY
Course Elapsed Days: 8
Plan Fractions Treated to Date: 4
Plan Fractions Treated to Date: 7
Plan Prescribed Dose Per Fraction: 2 Gy
Plan Prescribed Dose Per Fraction: 2 Gy
Plan Total Fractions Prescribed: 13
Plan Total Fractions Prescribed: 25
Plan Total Prescribed Dose: 26 Gy
Plan Total Prescribed Dose: 50 Gy
Reference Point Dosage Given to Date: 14 Gy
Reference Point Dosage Given to Date: 8 Gy
Reference Point Session Dosage Given: 2 Gy
Reference Point Session Dosage Given: 2 Gy
Session Number: 7

## 2024-03-13 ENCOUNTER — Ambulatory Visit
Admission: RE | Admit: 2024-03-13 | Discharge: 2024-03-13 | Disposition: A | Discharge status: Home / Self Care | Source: Ambulatory Visit | Attending: Radiation Oncology | Admitting: Radiation Oncology

## 2024-03-13 ENCOUNTER — Ambulatory Visit

## 2024-03-13 ENCOUNTER — Other Ambulatory Visit: Payer: Self-pay

## 2024-03-13 DIAGNOSIS — M6281 Muscle weakness (generalized): Secondary | ICD-10-CM

## 2024-03-13 DIAGNOSIS — Z483 Aftercare following surgery for neoplasm: Secondary | ICD-10-CM | POA: Diagnosis not present

## 2024-03-13 DIAGNOSIS — C50212 Malignant neoplasm of upper-inner quadrant of left female breast: Secondary | ICD-10-CM | POA: Diagnosis not present

## 2024-03-13 DIAGNOSIS — M25612 Stiffness of left shoulder, not elsewhere classified: Secondary | ICD-10-CM

## 2024-03-13 DIAGNOSIS — M25611 Stiffness of right shoulder, not elsewhere classified: Secondary | ICD-10-CM

## 2024-03-13 LAB — RAD ONC ARIA SESSION SUMMARY
Course Elapsed Days: 9
Plan Fractions Treated to Date: 4
Plan Fractions Treated to Date: 8
Plan Prescribed Dose Per Fraction: 2 Gy
Plan Prescribed Dose Per Fraction: 2 Gy
Plan Total Fractions Prescribed: 12
Plan Total Fractions Prescribed: 25
Plan Total Prescribed Dose: 24 Gy
Plan Total Prescribed Dose: 50 Gy
Reference Point Dosage Given to Date: 16 Gy
Reference Point Dosage Given to Date: 8 Gy
Reference Point Session Dosage Given: 2 Gy
Reference Point Session Dosage Given: 2 Gy
Session Number: 8

## 2024-03-13 NOTE — Patient Instructions (Addendum)
 Ann Daugherty

## 2024-03-13 NOTE — Therapy (Addendum)
 [facility] [address] [place], [place], [address] Phone: [phone]   Fax:  [phone]  Patient Details  Name: Ann Daugherty MRN: [id number] Date of Birth: [birth year] Referring Provider:  Lockie Rima, MD  Encounter Date: 03/13/2024  OUTPATIENT PHYSICAL THERAPY ONCOLOGY TREATMENT  Patient Name: Ann Daugherty MRN: [id number] DOB:[birth year], 53 y.o., female Today's Date: 03/13/2024  END OF SESSION:  PT End of Session - 03/13/24 0905     Visit Number 5    Number of Visits 17    Date for PT Re-Evaluation 04/20/24    PT Start Time 0900    PT Stop Time 1001    PT Time Calculation (min) 61 min    Activity Tolerance Patient tolerated treatment well    Behavior During Therapy WFL for tasks assessed/performed              Past Medical History:  Diagnosis Date   ADD (attention deficit disorder) 2015   Does not take any medication   Anxiety    Cancer (HCC)    breast   Depression    GERD (gastroesophageal reflux disease)    History of chicken pox    PONV (postoperative nausea and vomiting)    Past Surgical History:  Procedure Laterality Date   BACK SURGERY     BREAST BIOPSY Left 07/19/2023   US  LT BREAST BX W LOC DEV EA ADD LESION IMG BX SPEC US  GUIDE 07/19/2023 [facility]   BREAST BIOPSY Left 07/19/2023   US  LT BREAST BX W LOC DEV 1ST LESION IMG BX SPEC US  GUIDE 07/19/2023 [facility]   BREAST BIOPSY Right 08/09/2023   US  RT BREAST BX W LOC DEV 1ST LESION IMG BX SPEC US  GUIDE 08/09/2023 [facility]   BREAST BIOPSY Right 08/09/2023   US  RT BREAST BX W LOC DEV EA ADD LESION IMG BX SPEC US  GUIDE 08/09/2023 [facility]   IR GUIDED LIVER TUMOR ABLATION RFA PERC     IR RADIOLOGIST EVAL & MGMT  01/08/2024   IR RADIOLOGIST EVAL & MGMT  01/31/2024   LAMINECTOMY     2007, 2011   PORTACATH PLACEMENT Right 08/14/2023   Procedure: INSERTION PORT-A-CATH WITH ULTRASOUND GUIDEANCE;  Surgeon: Lockie Rima,  MD;  Location: [facility];  Service: General;  Laterality: Right;   SIMPLE MASTECTOMY WITH AXILLARY SENTINEL NODE BIOPSY Bilateral 01/24/2024   Procedure: BILATERAL MASTECTOMY;  Surgeon: Lockie Rima, MD;  Location: [facility];  Service: General;  Laterality: Bilateral;  PEC BLOCK   VAGINAL DELIVERY  2004, 2006   Patient Active Problem List   Diagnosis Date Noted   Primary cancer of upper inner quadrant of left breast (HCC) 01/24/2024   Port-A-Cath in place 08/15/2023   Malignant neoplasm of upper-inner quadrant of left female breast (HCC) 07/20/2023   Depression, major, single episode, mild (HCC) 04/05/2021   Tobacco abuse 01/30/2018   Anxiety 01/30/2018   Attention deficit disorder 01/30/2018   Vitamin D  deficiency 01/30/2018    PCP:   REFERRING PROVIDER: Lockie Rima, MD  REFERRING DIAG: C50.112 (ICD-10-CM) - Malignant neoplasm of central portion of left female breast Z17.0 (ICD-10-CM) - Estrogen receptor positive status (ER+)  THERAPY DIAG:  Aftercare following surgery for neoplasm  Stiffness of left shoulder, not elsewhere classified  Muscle weakness (generalized)  Stiffness of right shoulder, not elsewhere classified  ONSET DATE: 07/2023  Rationale for Evaluation and Treatment: Rehabilitation  SUBJECTIVE:  SUBJECTIVE STATEMENT I did the theraband exercises since I was here and they went well. So far my skin is doing okay from radiation but it's still early. Kinesiotape really seemed to help the area of edema at my Rt lateral trunk! It's not as fluidy feeling today.   Eval: Pt wants to have the best possible outcome and wants to get through radiation as good as possible  Pt  wants to improve her posture as she is adjusting to the new body image.  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 The 5 o'clock mass had grade 3 invasive ductal carcinoma that is ER/PR positive, her 2 negative, and Ki 67 60%. The 10:30 mass was grade 2 invasive ductal carcinoma, Er/PR +, her 2 negative and Ki 67 30%. Pt had neoadjuvant chemotherapy during which she had about 30# weight loss.  She metastasis to liver and underwent a liver ablation 01/16/2024. Pt had bilataeral mastectomy on 3/20/ with no lymph nodes removed.  She will start radiation this week.    PAIN:  Are you having pain? no  PRECAUTIONS: Other: recent surgery, will be having radiaiton   WEIGHT BEARING RESTRICTIONS: No  FALLS:  Has patient fallen in last 6 months? 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  Mom had had bilateral mastectomies and bilateral knee replacement and colon cancer   HAND DOMINANCE: right   PRIOR LEVEL OF FUNCTION: Independent  PATIENT GOALS: pt want to improve her range of motion and have the best possible outcome from surgery and wants to impove her general strenth    OBJECTIVE: Note: Objective measures were completed at Evaluation unless otherwise noted.  COGNITION: Overall cognitive status: Within functional limits for tasks assessed   PALPATION: Mild fullness at lateral chest below incision on both sides with more on left side   OBSERVATIONS / OTHER ASSESSMENTS: pt with well healing inciscion across front of chest with steristrips still  intact in central portion.  Bilateral drain sites with scab intacdt   SENSATION: Pt reports numbness across front of chest and lateral chest   POSTURE: forward head and shoulders . Left ribs rotated forward and prominent in standing. Pt had decreased scapular stabalization on left with scapula moving away from midline with active left shoulder elevation   UPPER EXTREMITY AROM/PROM:  A/PROM RIGHT   eval   Shoulder extension 65  Shoulder flexion 140  Shoulder abduction 135  Shoulder internal rotation   Shoulder external rotation 90    (Blank rows = not tested)  A/PROM LEFT   eval  Shoulder extension 65  Shoulder flexion 155  Shoulder abduction 170  Shoulder internal rotation   Shoulder external rotation 90    (Blank rows = not tested)  CERVICAL AROM: All within normal limits:    UPPER EXTREMITY STRENGTH: shoulders 3-5/5 due to pain from surgery . Otherwise 4+/5 pt reports general weakness from chemo as she feels  she has lost muscle mass   LYMPHEDEMA ASSESSMENTS:   SURGERY TYPE/DATE: 01/24/2024 bilateral mastectomy   NUMBER OF LYMPH NODES REMOVED: 0  CHEMOTHERAPY: yes  RADIATION:yes, will start this week   HORMONE TREATMENT: yes  INFECTIONS: no  LYMPHEDEMA ASSESSMENTS:   LANDMARK RIGHT  eval  10 cm proximal to olecranon process 27  Olecranon process 23  10 cm proximal to ulnar styloid process 19.5  Just proximal to ulnar styloid process 15.3  Across hand at thumb web space 19  At base of 2nd digit 5.8  (Blank rows = not tested)  LANDMARK LEFT  eval  10 cm proximal to olecranon process 27  Olecranon process 24  10 cm proximal to ulnar styloid process 19.7  Just proximal to ulnar styloid process 15.7  Across hand at thumb web space 18.7  At base of 2nd digit 5.8  (Blank rows = not tested)    FUNCTIONAL TESTS:  30 seconds chair stand test  13 stit to stand in 30 seconds from low mat height      QUICK DASH SURVEY: 40.91                                                                                                                              TREATMENT DATE:  03/13/24: Therapeutic Exercises Pulleys into flex and abd x 2 mins each reminding pt to decrease bil scapular compensations with end range stretches Roll yellow ball up wall into flex and abd x 10 each Therapeutic Activities Standing with back against wall/core engaged, and head and shoulders back against wall for bil UE 3 way raises with 2# to shoulder height only into flex, scaption and abd x 10 each returning therapist demo Wall Push Ups with VC's for cervical retraction and elbows staying down to decrease scap compensation, x 10 reps Modified Downward Dog on wall Manual Therapy  P/ROM: In supine to Lt shoulder into flex, abd and D2 with scapular depression throughout, then same to Rt side and spent more time on Rt side MFR to cording that extends from Rt mastectomy incision to axilla and into medial upper arm with prolonged holds when Rt UE in end range stretch and relaxed on mat STM to each pectoralis insertion where palpably tight, avoiding area of port on Rt side MLD: Short neck, 5 diaphragmatic breaths, Rt inguinal nodes, Rt axillo-inguinal anastomosis, and focused on small area of edema at Rt lateral trunk Issued another piece of K-Tape as pts was still in place and not coming off yet. Instructed her how to apply. She was able to verbalize good understanding.   03/10/24: Therapeutic Exercises Pulleys into flex and abd x 2 mins each reminding pt to decrease bil scapular compensations with end range stretches Roll yellow ball up wall into flex and abd x 10 each Therapeutic Activities Supine over full foam roll for supine scapular series with yellow theraband x 10 each returning therapist demo for each and VC's during  for core engagement for increased stability Manual Therapy P/ROM: In supine to Lt shoulder into flex, abd and D2 with scapular depression throughout, then same  to Rt side and spent more time on Rt side MFR to cording that extends from Rt mastectomy incision to axilla and into medial upper arm with prolonged holds when Rt UE in end range stretch and relaxed on mat STM to each pectoralis insertion where palpably tight, avoiding area of port on Rt side MLD: Short neck, 5 diaphragmatic breaths, Rt inguinal nodes, Rt axillo-inguinal anastomosis, and focused on small area of edema at Rt lateral trunk Kinesiotape trial along Rt axillo-inguinal anastomosis with bucket inferior to transverse watershed and 3 fingers running up to edema at lateral trunk (2 fingers inferior and 1 superior to mastectomy incision). Pt was instructed on doffing after 2-3 days or when edges begin to unfurl and to assess skin after.    02/25/24: Therapeutic Exercises Pulleys into flex and abd x 3 mins each, tactile and VC's to decrease bil scapular compensations, then used mirror for visual cuing to decrease scapular compensations. Ball roll up wall into flex x 10, then bil abd x 5 each returning therapist demo Attempt supine of half foam roll but pt reports this max stretch so just laid on foam x 2 mins, then stopped Manual Therapy P/ROM: In supine to Lt shoulder into flex, abd and D2 with scapular depression throughout by therapist, then same to Rt side and spent more time on Rt side MFR to cording that extends from Rt mastectomy incision to axilla and into medial upper arm, also Lt LTR 3x for increased myofascial pull MLD: Short neck, 5 diaphragmatic breaths, Rt inguinal nodes, Rt axillo-inguinal anastomosis, and focused on small area of edema at Rt lateral trunk  02/20/2024:  Lymphatic treatment:Instructed in self MLD and performed MLD: short neck . Superficial abdominals and deep breathing. Left inguinal nodes. Left axillary-inginal anastamosis with use of mirror to monitor pulling on incision. Left axillary nodes. Central chest and moved to doing same sequence on right chest and  axilla.  Noted small amount of cording in right axilla ( though pt did not have lymph node removed) that seemed to soften a bit with MLD and soft tissue work to this area.  Gave pt 2 foam patches to wear at lateral chest inside bra at areas of fullness  Exercise: in hook lying: pelvic tilts and supine marches with core stable.  In sitting while wearing compression bra: lymphatic flow series: neck and thoracic and UE ROM with breathing and good postrue.  Wall slides with folded towel     PATIENT EDUCATION:  Education details: Standing bil UE 3 way raises  Person educated: Patient Education method: Explanation, Demonstration, Tactile cues, Verbal cues, and Handouts Education comprehension: verbalized understanding and returned demonstration and VC's during  HOME EXERCISE PROGRAM: Supine dowel exercise Supine scapular series  Standing bil UE 3 way raises  ASSESSMENT:  CLINICAL IMPRESSION: Pt reports feeling K-Tape really helpful so issued her a second piece with education how to apply. Progressed HEP to include bil UE 3 way raises that pt can alternate with theraband exs at home and was instructed in this. Then continued manual therapy to decrease bil upper quadrant tightness.     OBJECTIVE IMPAIRMENTS: decreased endurance, decreased ROM, decreased strength, increased edema, increased fascial restrictions, impaired perceived functional ability, impaired UE functional use, postural dysfunction, and pain.   ACTIVITY LIMITATIONS: carrying, lifting, reach over head, hygiene/grooming, and endurance   PARTICIPATION  LIMITATIONS: will be having radiaiton   PERSONAL FACTORS: 3+ comorbidities: past chemo, liver mets and monitoring, will have radiation are also affecting patient's functional outcome.   REHAB POTENTIAL: Good  CLINICAL DECISION MAKING: Evolving/moderate complexity  EVALUATION COMPLEXITY: Moderate  GOALS: Goals reviewed with patient? Yes  SHORT TERM GOALS: Target date:  03/20/2024  Right and left shoulder flexion will be 160  Baseline: right is 140, left is 155 Goal status: INITIAL  2.  Pt will be independent in a basic home exercise program for shoulder ROM  Baseline: no knowledge  Goal status: INITIAL   LONG TERM GOALS: Target date: 04/21/2024  Pt will have painfree right shoulder abduction of 170 degrees  Baseline: 135 with pain  Goal status: INITIAL  2.  Pt will be independent in managing trunk fullness with compression and manual lymph drainage  Baseline: no knowledge Goal status: INITIAL  3.  Pt will be independent in a stregnthening exercises program that she can do at home  Baseline: no knowledge  Goal status: INITIAL  4.  Pt will decrease Quick Dash score to < 10  Baseline: 40.91 Goal status: INITIAL  5.  Pt will increase 30 second sit to stand to 16 Baseline: 13 Goal status: INITIAL  PLAN:  PT FREQUENCY: 2x/week  PT DURATION: 8 weeks  PLANNED INTERVENTIONS: 97164- PT Re-evaluation, 97750- Physical Performance Testing, 97110-Therapeutic exercises, 97530- Therapeutic activity, [id number]- Neuromuscular re-education, 97535- Self Care, 16109- Manual therapy, Patient/Family education, Manual lymph drainage, Scar mobilization, and Compression bandaging  PLAN FOR NEXT SESSION:  Review supine scap and bil UE 3 way raises prn. focus on right side and cording in right axilla; review gentle manual lymph drainage to lateral chest; P/AA/AROM to both shoulders. Look at scapular symmetry. Progress exercises and later instruct in Strength ABC Program.    Denyce Flank, PTA 03/13/2024, 10:03 AM   Denyce Flank, PTA 03/13/2024, 10:03 AM  [facility] [address] [place], [place], [address] Phone: [phone]   Fax:  [phone]   Patient Details  Name: Ann Daugherty MRN: [id number] Date of Birth: June 04, 1971 Referring Provider:  Lockie Rima, MD  Encounter Date: 03/13/2024  3  Way Raises:      Starting Position:  Leaning against wall, walk feet a few inches away from the wall and make tummy tight (tuck hips underneath you) Press back/shoulders/head against wall as much as possible. Keep thumbs up to ceiling, elbows straight and shoulders relaxed/down throughout.  1. Lift arms in front to shoulder height 2. Lift arms a little wider into a "V" to shoulder height 3. Lift arms out to sides in a "T" to shoulder height  Perform 10 times in each direction. Hold 1-2 lbs to start with and work up to 2-3 sets of 10/day. Perform 3-4 times/week. Increase weight as able, decreasing sets of 10 each time you increase weights, then slowly working your way back up to 2-3 sets each time.      Wall Push Ups keeping elbows down and chin tucked 1-2 sets of 10 Modified downward dog on wall x 5 reps, 5 sec holds  Cancer Rehab [phone]

## 2024-03-14 ENCOUNTER — Other Ambulatory Visit: Payer: Self-pay

## 2024-03-14 ENCOUNTER — Ambulatory Visit
Admission: RE | Admit: 2024-03-14 | Discharge: 2024-03-14 | Disposition: A | Discharge status: Home / Self Care | Source: Ambulatory Visit | Attending: Radiation Oncology

## 2024-03-14 DIAGNOSIS — C50212 Malignant neoplasm of upper-inner quadrant of left female breast: Secondary | ICD-10-CM | POA: Diagnosis not present

## 2024-03-14 LAB — RAD ONC ARIA SESSION SUMMARY
Course Elapsed Days: 10
Plan Fractions Treated to Date: 5
Plan Fractions Treated to Date: 9
Plan Prescribed Dose Per Fraction: 2 Gy
Plan Prescribed Dose Per Fraction: 2 Gy
Plan Total Fractions Prescribed: 13
Plan Total Fractions Prescribed: 25
Plan Total Prescribed Dose: 26 Gy
Plan Total Prescribed Dose: 50 Gy
Reference Point Dosage Given to Date: 10 Gy
Reference Point Dosage Given to Date: 18 Gy
Reference Point Session Dosage Given: 2 Gy
Reference Point Session Dosage Given: 2 Gy
Session Number: 9

## 2024-03-17 ENCOUNTER — Ambulatory Visit
Admission: RE | Admit: 2024-03-17 | Discharge: 2024-03-17 | Disposition: A | Discharge status: Home / Self Care | Source: Ambulatory Visit | Attending: Radiation Oncology

## 2024-03-17 ENCOUNTER — Other Ambulatory Visit: Payer: Self-pay

## 2024-03-17 ENCOUNTER — Ambulatory Visit

## 2024-03-17 ENCOUNTER — Ambulatory Visit
Admission: RE | Admit: 2024-03-17 | Discharge: 2024-03-17 | Disposition: A | Discharge status: Home / Self Care | Source: Ambulatory Visit | Attending: Radiation Oncology | Admitting: Radiation Oncology

## 2024-03-17 DIAGNOSIS — Z483 Aftercare following surgery for neoplasm: Secondary | ICD-10-CM | POA: Diagnosis not present

## 2024-03-17 DIAGNOSIS — C50212 Malignant neoplasm of upper-inner quadrant of left female breast: Secondary | ICD-10-CM | POA: Diagnosis not present

## 2024-03-17 DIAGNOSIS — M25612 Stiffness of left shoulder, not elsewhere classified: Secondary | ICD-10-CM

## 2024-03-17 DIAGNOSIS — M6281 Muscle weakness (generalized): Secondary | ICD-10-CM | POA: Diagnosis not present

## 2024-03-17 DIAGNOSIS — Z510 Encounter for antineoplastic radiation therapy: Secondary | ICD-10-CM | POA: Diagnosis not present

## 2024-03-17 DIAGNOSIS — M25611 Stiffness of right shoulder, not elsewhere classified: Secondary | ICD-10-CM | POA: Diagnosis not present

## 2024-03-17 DIAGNOSIS — Z170 Estrogen receptor positive status [ER+]: Secondary | ICD-10-CM | POA: Diagnosis not present

## 2024-03-17 DIAGNOSIS — Z419 Encounter for procedure for purposes other than remedying health state, unspecified: Secondary | ICD-10-CM | POA: Diagnosis not present

## 2024-03-17 LAB — RAD ONC ARIA SESSION SUMMARY
Course Elapsed Days: 13
Plan Fractions Treated to Date: 10
Plan Fractions Treated to Date: 5
Plan Prescribed Dose Per Fraction: 2 Gy
Plan Prescribed Dose Per Fraction: 2 Gy
Plan Total Fractions Prescribed: 12
Plan Total Fractions Prescribed: 25
Plan Total Prescribed Dose: 24 Gy
Plan Total Prescribed Dose: 50 Gy
Reference Point Dosage Given to Date: 10 Gy
Reference Point Dosage Given to Date: 20 Gy
Reference Point Session Dosage Given: 2 Gy
Reference Point Session Dosage Given: 2 Gy
Session Number: 10

## 2024-03-17 NOTE — Therapy (Signed)
 [facility] [address] [place], [place], [address] Phone: [phone]   Fax:  [phone]  Patient Details  Name: Ann Daugherty MRN: [id number] Date of Birth: October 29, 1971 Referring Provider:  Lockie Rima, MD  Encounter Date: 03/17/2024  OUTPATIENT PHYSICAL THERAPY ONCOLOGY TREATMENT  Patient Name: Ann Daugherty MRN: [id number] DOB:Aug 12, 1971, 53 y.o., female Today's Date: 03/17/2024  END OF SESSION:  PT End of Session - 03/17/24 1209     Visit Number 6    Number of Visits 17    Date for PT Re-Evaluation 04/20/24    PT Start Time 1206   pt arrived late   PT Stop Time 1310    PT Time Calculation (min) 64 min    Activity Tolerance Patient tolerated treatment well    Behavior During Therapy WFL for tasks assessed/performed              Past Medical History:  Diagnosis Date   ADD (attention deficit disorder) 2015   Does not take any medication   Anxiety    Cancer (HCC)    breast   Depression    GERD (gastroesophageal reflux disease)    History of chicken pox    PONV (postoperative nausea and vomiting)    Past Surgical History:  Procedure Laterality Date   BACK SURGERY     BREAST BIOPSY Left 07/19/2023   US  LT BREAST BX W LOC DEV EA ADD LESION IMG BX SPEC US  GUIDE 07/19/2023 [facility]   BREAST BIOPSY Left 07/19/2023   US  LT BREAST BX W LOC DEV 1ST LESION IMG BX SPEC US  GUIDE 07/19/2023 [facility]   BREAST BIOPSY Right 08/09/2023   US  RT BREAST BX W LOC DEV 1ST LESION IMG BX SPEC US  GUIDE 08/09/2023 [facility]   BREAST BIOPSY Right 08/09/2023   US  RT BREAST BX W LOC DEV EA ADD LESION IMG BX SPEC US  GUIDE 08/09/2023 [facility]   IR GUIDED LIVER TUMOR ABLATION RFA PERC     IR RADIOLOGIST EVAL & MGMT  01/08/2024   IR RADIOLOGIST EVAL & MGMT  01/31/2024   LAMINECTOMY     2007, 2011   PORTACATH PLACEMENT Right 08/14/2023   Procedure: INSERTION PORT-A-CATH WITH ULTRASOUND GUIDEANCE;   Surgeon: Lockie Rima, MD;  Location: [facility];  Service: General;  Laterality: Right;   SIMPLE MASTECTOMY WITH AXILLARY SENTINEL NODE BIOPSY Bilateral 01/24/2024   Procedure: BILATERAL MASTECTOMY;  Surgeon: Lockie Rima, MD;  Location: [facility];  Service: General;  Laterality: Bilateral;  PEC BLOCK   VAGINAL DELIVERY  2004, 2006   Patient Active Problem List   Diagnosis Date Noted   Primary cancer of upper inner quadrant of left breast (HCC) 01/24/2024   Port-A-Cath in place 08/15/2023   Malignant neoplasm of upper-inner quadrant of left female breast (HCC) 07/20/2023   Depression, major, single episode, mild (HCC) 04/05/2021   Tobacco abuse 01/30/2018   Anxiety 01/30/2018   Attention deficit disorder 01/30/2018   Vitamin D  deficiency 01/30/2018    PCP:   REFERRING PROVIDER: Lockie Rima, MD  REFERRING DIAG: C50.112 (ICD-10-CM) - Malignant neoplasm of central portion of left female breast Z17.0 (ICD-10-CM) - Estrogen receptor positive status (ER+)  THERAPY DIAG:  Aftercare following surgery for neoplasm  Stiffness of left shoulder, not elsewhere classified  Muscle weakness (generalized)  Stiffness of right shoulder, not elsewhere classified  ONSET DATE: 07/2023  Rationale for Evaluation and Treatment: Rehabilitation  SUBJECTIVE:  SUBJECTIVE STATEMENT I did the bil UE 3 way raises and doorway stretch over the weekend and those are going great. The tape made a big difference. I haven't put the other tape on yet that you gave me and I fell asleep without my compression bra last night so it's more swollen today at the Rt lateral trunk.   Eval: Pt wants to have the best possible outcome and wants to get through radiation as good as possible  Pt  wants to improve her posture as she is  adjusting to the new body image.  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 The 5 o'clock mass had grade 3 invasive ductal carcinoma that is ER/PR positive, her 2 negative, and Ki 67 60%. The 10:30 mass was grade 2 invasive ductal carcinoma, Er/PR +, her 2 negative and Ki 67 30%. Pt had neoadjuvant chemotherapy during which she had about 30# weight loss.  She metastasis to liver and underwent a liver ablation 01/16/2024. Pt had bilataeral mastectomy on 3/20/ with no lymph nodes removed.  She will start radiation this week.    PAIN:  Are you having pain? no  PRECAUTIONS: Other: recent surgery, will be having radiaiton   WEIGHT BEARING RESTRICTIONS: No  FALLS:  Has patient fallen in last 6 months? 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  Mom had had bilateral mastectomies and bilateral knee replacement and colon cancer   HAND DOMINANCE: right   PRIOR LEVEL OF FUNCTION: Independent  PATIENT GOALS: pt want to improve her range of motion and have the best possible outcome from surgery and wants to impove her general strenth    OBJECTIVE: Note: Objective measures were completed at Evaluation unless otherwise noted.  COGNITION: Overall cognitive status: Within functional limits for tasks assessed   PALPATION: Mild fullness at lateral chest below incision on both sides with more on left side   OBSERVATIONS / OTHER ASSESSMENTS: pt with  well healing inciscion across front of chest with steristrips still intact in central portion.  Bilateral drain sites with scab intacdt   SENSATION: Pt reports numbness across front of chest and lateral chest   POSTURE: forward head and shoulders . Left ribs rotated forward and prominent in standing. Pt had decreased scapular stabalization on left with scapula moving away from midline with active left shoulder elevation   UPPER EXTREMITY AROM/PROM:  A/PROM RIGHT   eval   Shoulder extension 65  Shoulder flexion 140  Shoulder abduction 135  Shoulder internal rotation   Shoulder external rotation 90    (Blank rows = not tested)  A/PROM LEFT   eval  Shoulder extension 65  Shoulder flexion 155  Shoulder abduction 170  Shoulder internal rotation   Shoulder external rotation 90    (Blank rows = not tested)  CERVICAL AROM: All within normal limits:    UPPER EXTREMITY STRENGTH: shoulders 3-5/5 due to pain from surgery .  Otherwise 4+/5 pt reports general weakness from chemo as she feels she has lost muscle mass   LYMPHEDEMA ASSESSMENTS:   SURGERY TYPE/DATE: 01/24/2024 bilateral mastectomy   NUMBER OF LYMPH NODES REMOVED: 0  CHEMOTHERAPY: yes  RADIATION:yes, will start this week   HORMONE TREATMENT: yes  INFECTIONS: no  LYMPHEDEMA ASSESSMENTS:   LANDMARK RIGHT  eval  10 cm proximal to olecranon process 27  Olecranon process 23  10 cm proximal to ulnar styloid process 19.5  Just proximal to ulnar styloid process 15.3  Across hand at thumb web space 19  At base of 2nd digit 5.8  (Blank rows = not tested)  LANDMARK LEFT  eval  10 cm proximal to olecranon process 27  Olecranon process 24  10 cm proximal to ulnar styloid process 19.7  Just proximal to ulnar styloid process 15.7  Across hand at thumb web space 18.7  At base of 2nd digit 5.8  (Blank rows = not tested)    FUNCTIONAL TESTS:  30 seconds chair stand test  13 stit to stand in 30 seconds from low mat  height      QUICK DASH SURVEY: 40.91                                                                                                                             TREATMENT DATE:  03/17/24: Therapeutic Exercise Roll yellow ball up wall into flex and abd x 10 each Modified downward dog on wall x5 reps, 5 sec holds Doorway pectoralis stretch x 5 reps, 20 sec each Therapeutic Activities  Prone "Y's and I's" 2 x 5 each with 1# and forehead on rolled towel, handout issued Free Motion Machine 7#  2 x 10 with core engaged and in mini squat position returning therapist demo, then bil shoulder ext x 10 reps, 3# with VC's for scapular engagement at end motions Manual Therapy P/ROM: In supine to Lt shoulder into flex, abd and D2 with scapular depression throughout, then same to Rt side and spent more time on Rt side MFR to cording that extends from Rt mastectomy incision to axilla and into medial upper arm with prolonged holds when Rt UE in end range stretch; some improvement noted with this today STM to each pectoralis insertion where palpably tight, avoiding area of port on Rt side and area of radiation on Lt side MLD: Short neck, 5 diaphragmatic breaths, Rt inguinal nodes, Rt axillo-inguinal anastomosis, and focused on small area of edema at Rt lateral trunk Kinesiotape along Rt axillo-inguinal anastomosis with bucket inferior to transverse watershed and 3 fingers running up to edema at lateral trunk (2 fingers inferior and 1 superior to mastectomy incision)  03/13/24: Therapeutic Exercises Pulleys into flex and abd x 2 mins each reminding pt to decrease bil scapular compensations with end range stretches Roll yellow ball up wall into flex and abd x 10 each Therapeutic Activities Standing with back against wall/core engaged, and head and shoulders back  against wall for bil UE 3 way raises with 2# to shoulder height only into flex, scaption and abd x 10 each returning therapist demo Wall Push Ups  with VC's for cervical retraction and elbows staying down to decrease scap compensation, x 10 reps Modified Downward Dog on wall Manual Therapy  P/ROM: In supine to Lt shoulder into flex, abd and D2 with scapular depression throughout, then same to Rt side and spent more time on Rt side MFR to cording that extends from Rt mastectomy incision to axilla and into medial upper arm with prolonged holds when Rt UE in end range stretch and relaxed on mat STM to each pectoralis insertion where palpably tight, avoiding area of port on Rt side MLD: Short neck, 5 diaphragmatic breaths, Rt inguinal nodes, Rt axillo-inguinal anastomosis, and focused on small area of edema at Rt lateral trunk Issued another piece of K-Tape as pts was still in place and not coming off yet. Instructed her how to apply. She was able to verbalize good understanding.   03/10/24: Therapeutic Exercises Pulleys into flex and abd x 2 mins each reminding pt to decrease bil scapular compensations with end range stretches Roll yellow ball up wall into flex and abd x 10 each Therapeutic Activities Supine over full foam roll for supine scapular series with yellow theraband x 10 each returning therapist demo for each and VC's during for core engagement for increased stability Manual Therapy P/ROM: In supine to Lt shoulder into flex, abd and D2 with scapular depression throughout, then same to Rt side and spent more time on Rt side MFR to cording that extends from Rt mastectomy incision to axilla and into medial upper arm with prolonged holds when Rt UE in end range stretch and relaxed on mat STM to each pectoralis insertion where palpably tight, avoiding area of port on Rt side MLD: Short neck, 5 diaphragmatic breaths, Rt inguinal nodes, Rt axillo-inguinal anastomosis, and focused on small area of edema at Rt lateral trunk Kinesiotape trial along Rt axillo-inguinal anastomosis with bucket inferior to transverse watershed and 3 fingers running  up to edema at lateral trunk (2 fingers inferior and 1 superior to mastectomy incision). Pt was instructed on doffing after 2-3 days or when edges begin to unfurl and to assess skin after.      PATIENT EDUCATION:  Education details: Prone "I, Y, and T's"  Person educated: Patient Education method: Explanation, Demonstration, Tactile cues, Verbal cues, and Handouts Education comprehension: verbalized understanding and returned demonstration and VC's during, will benefit from further review  HOME EXERCISE PROGRAM: Supine dowel exercise Supine scapular series  Standing bil UE 3 way raises Prone "I, Y, and T's"  ASSESSMENT:  CLINICAL IMPRESSION: Continued with postural strength and since pt is enjoying learning new exercises she can progress to later also instructed her in prone "I, Y, and T's" today. Only able to get into position today on mat for "Y and I's" but pt was instructed in how to also add "T" when she is able to scoot closer to EOB at home. Pt able to verbalize a good understanding. Then continued with end AA/ROM stretching in a variety of positions. Manual therapy continued as well to help improve end bil shoulders ROM and decrease Rt lateral trunk edema. Pt felt kinesiotape was very beneficial so reapplied this today as done before, see above.     OBJECTIVE IMPAIRMENTS: decreased endurance, decreased ROM, decreased strength, increased edema, increased fascial restrictions, impaired perceived functional ability, impaired UE functional use,  postural dysfunction, and pain.   ACTIVITY LIMITATIONS: carrying, lifting, reach over head, hygiene/grooming, and endurance   PARTICIPATION LIMITATIONS: will be having radiaiton   PERSONAL FACTORS: 3+ comorbidities: past chemo, liver mets and monitoring, will have radiation are also affecting patient's functional outcome.   REHAB POTENTIAL: Good  CLINICAL DECISION MAKING: Evolving/moderate complexity  EVALUATION COMPLEXITY:  Moderate  GOALS: Goals reviewed with patient? Yes  SHORT TERM GOALS: Target date: 03/20/2024  Right and left shoulder flexion will be 160  Baseline: right is 140, left is 155 Goal status: INITIAL  2.  Pt will be independent in a basic home exercise program for shoulder ROM  Baseline: no knowledge  Goal status: INITIAL   LONG TERM GOALS: Target date: 04/21/2024  Pt will have painfree right shoulder abduction of 170 degrees  Baseline: 135 with pain  Goal status: INITIAL  2.  Pt will be independent in managing trunk fullness with compression and manual lymph drainage  Baseline: no knowledge Goal status: INITIAL  3.  Pt will be independent in a stregnthening exercises program that she can do at home  Baseline: no knowledge  Goal status: INITIAL  4.  Pt will decrease Quick Dash score to < 10  Baseline: 40.91 Goal status: INITIAL  5.  Pt will increase 30 second sit to stand to 16 Baseline: 13 Goal status: INITIAL  PLAN:  PT FREQUENCY: 2x/week  PT DURATION: 8 weeks  PLANNED INTERVENTIONS: 97164- PT Re-evaluation, 97750- Physical Performance Testing, 97110-Therapeutic exercises, 97530- Therapeutic activity, 97112- Neuromuscular re-education, 97535- Self Care, 16109- Manual therapy, Patient/Family education, Manual lymph drainage, Scar mobilization, and Compression bandaging  PLAN FOR NEXT SESSION:  Review prone "I, Y, T's" prn; cont manual therapy focus on right side and cording in right axilla and pocket of lateral trunk edema; P/AA/AROM to both shoulders. Later instruct in Strength ABC Program, possibly near the end of radiation.    Denyce Flank, PTA 03/17/2024, 1:23 PM   [facility] [address] [place], [place], [address] Phone: [phone]   Fax:  [phone]   CHEST: Doorway, Bilateral - Standing    Standing in doorway with one foot in front of the other, place hands on wall with elbows bent at shoulder  height. Lean forward. Hold _20-30__ seconds. _2-3__ reps per set, _2__ sets per day.  Strengthening: Horizontal Abduction - with External Rotation (Prone)    Holding __1-2__ pound weights, raise arms out from sides, pinching shoulder blades. Keep elbows straight, thumbs up. This is "T" position. Repeat __10__ times per set. Do _1-2___ sets per session. Do __1__ sessions every other day.  Then do same with arms in "Y" position, and last "I" position.     Cancer Rehab [phone]

## 2024-03-18 ENCOUNTER — Ambulatory Visit
Admission: RE | Admit: 2024-03-18 | Discharge: 2024-03-18 | Disposition: A | Discharge status: Home / Self Care | Source: Ambulatory Visit | Attending: Radiation Oncology

## 2024-03-18 ENCOUNTER — Other Ambulatory Visit: Payer: Self-pay

## 2024-03-18 ENCOUNTER — Ambulatory Visit

## 2024-03-18 DIAGNOSIS — C50212 Malignant neoplasm of upper-inner quadrant of left female breast: Secondary | ICD-10-CM | POA: Diagnosis not present

## 2024-03-18 LAB — RAD ONC ARIA SESSION SUMMARY
Course Elapsed Days: 14
Plan Fractions Treated to Date: 11
Plan Fractions Treated to Date: 6
Plan Prescribed Dose Per Fraction: 2 Gy
Plan Prescribed Dose Per Fraction: 2 Gy
Plan Total Fractions Prescribed: 13
Plan Total Fractions Prescribed: 25
Plan Total Prescribed Dose: 26 Gy
Plan Total Prescribed Dose: 50 Gy
Reference Point Dosage Given to Date: 12 Gy
Reference Point Dosage Given to Date: 22 Gy
Reference Point Session Dosage Given: 2 Gy
Reference Point Session Dosage Given: 2 Gy
Session Number: 11

## 2024-03-19 ENCOUNTER — Ambulatory Visit

## 2024-03-19 ENCOUNTER — Ambulatory Visit
Admission: RE | Admit: 2024-03-19 | Discharge: 2024-03-19 | Disposition: A | Discharge status: Home / Self Care | Source: Ambulatory Visit | Attending: Radiation Oncology | Admitting: Radiation Oncology

## 2024-03-19 ENCOUNTER — Other Ambulatory Visit: Payer: Self-pay

## 2024-03-19 DIAGNOSIS — M25611 Stiffness of right shoulder, not elsewhere classified: Secondary | ICD-10-CM | POA: Diagnosis not present

## 2024-03-19 DIAGNOSIS — Z510 Encounter for antineoplastic radiation therapy: Secondary | ICD-10-CM | POA: Diagnosis not present

## 2024-03-19 DIAGNOSIS — M6281 Muscle weakness (generalized): Secondary | ICD-10-CM | POA: Diagnosis not present

## 2024-03-19 DIAGNOSIS — M25612 Stiffness of left shoulder, not elsewhere classified: Secondary | ICD-10-CM | POA: Diagnosis not present

## 2024-03-19 DIAGNOSIS — Z483 Aftercare following surgery for neoplasm: Secondary | ICD-10-CM | POA: Diagnosis not present

## 2024-03-19 DIAGNOSIS — Z170 Estrogen receptor positive status [ER+]: Secondary | ICD-10-CM | POA: Diagnosis not present

## 2024-03-19 DIAGNOSIS — C50212 Malignant neoplasm of upper-inner quadrant of left female breast: Secondary | ICD-10-CM | POA: Diagnosis not present

## 2024-03-19 LAB — RAD ONC ARIA SESSION SUMMARY
Course Elapsed Days: 15
Plan Fractions Treated to Date: 12
Plan Fractions Treated to Date: 6
Plan Prescribed Dose Per Fraction: 2 Gy
Plan Prescribed Dose Per Fraction: 2 Gy
Plan Total Fractions Prescribed: 12
Plan Total Fractions Prescribed: 25
Plan Total Prescribed Dose: 24 Gy
Plan Total Prescribed Dose: 50 Gy
Reference Point Dosage Given to Date: 12 Gy
Reference Point Dosage Given to Date: 24 Gy
Reference Point Session Dosage Given: 2 Gy
Reference Point Session Dosage Given: 2 Gy
Session Number: 12

## 2024-03-19 NOTE — Therapy (Signed)
 [facility] [address] [place], [place], [address] Phone: [phone]   Fax:  [phone]  Patient Details  Name: Ann Daugherty MRN: [id number] Date of Birth: 09/27/1971 Referring Provider:  Lockie Rima, MD  Encounter Date: 03/19/2024  OUTPATIENT PHYSICAL THERAPY ONCOLOGY TREATMENT  Patient Name: Ann Daugherty MRN: [id number] DOB:Jun 11, 1971, 53 y.o., female Today's Date: 03/19/2024  END OF SESSION:  PT End of Session - 03/19/24 1205     Visit Number 7    Number of Visits 17    Date for PT Re-Evaluation 04/20/24    PT Start Time 1202    PT Stop Time 1258    PT Time Calculation (min) 56 min    Activity Tolerance Patient tolerated treatment well    Behavior During Therapy WFL for tasks assessed/performed              Past Medical History:  Diagnosis Date   ADD (attention deficit disorder) 2015   Does not take any medication   Anxiety    Cancer (HCC)    breast   Depression    GERD (gastroesophageal reflux disease)    History of chicken pox    PONV (postoperative nausea and vomiting)    Past Surgical History:  Procedure Laterality Date   BACK SURGERY     BREAST BIOPSY Left 07/19/2023   US  LT BREAST BX W LOC DEV EA ADD LESION IMG BX SPEC US  GUIDE 07/19/2023 [facility]   BREAST BIOPSY Left 07/19/2023   US  LT BREAST BX W LOC DEV 1ST LESION IMG BX SPEC US  GUIDE 07/19/2023 [facility]   BREAST BIOPSY Right 08/09/2023   US  RT BREAST BX W LOC DEV 1ST LESION IMG BX SPEC US  GUIDE 08/09/2023 [facility]   BREAST BIOPSY Right 08/09/2023   US  RT BREAST BX W LOC DEV EA ADD LESION IMG BX SPEC US  GUIDE 08/09/2023 [facility]   IR GUIDED LIVER TUMOR ABLATION RFA PERC     IR RADIOLOGIST EVAL & MGMT  01/08/2024   IR RADIOLOGIST EVAL & MGMT  01/31/2024   LAMINECTOMY     2007, 2011   PORTACATH PLACEMENT Right 08/14/2023   Procedure: INSERTION PORT-A-CATH WITH ULTRASOUND GUIDEANCE;  Surgeon: Lockie Rima,  MD;  Location: [facility];  Service: General;  Laterality: Right;   SIMPLE MASTECTOMY WITH AXILLARY SENTINEL NODE BIOPSY Bilateral 01/24/2024   Procedure: BILATERAL MASTECTOMY;  Surgeon: Lockie Rima, MD;  Location: [facility];  Service: General;  Laterality: Bilateral;  PEC BLOCK   VAGINAL DELIVERY  2004, 2006   Patient Active Problem List   Diagnosis Date Noted   Primary cancer of upper inner quadrant of left breast (HCC) 01/24/2024   Port-A-Cath in place 08/15/2023   Malignant neoplasm of upper-inner quadrant of left female breast (HCC) 07/20/2023   Depression, major, single episode, mild (HCC) 04/05/2021   Tobacco abuse 01/30/2018   Anxiety 01/30/2018   Attention deficit disorder 01/30/2018   Vitamin D  deficiency 01/30/2018    PCP:   REFERRING PROVIDER: Lockie Rima, MD  REFERRING DIAG: C50.112 (ICD-10-CM) - Malignant neoplasm of central portion of left female breast Z17.0 (ICD-10-CM) - Estrogen receptor positive status (ER+)  THERAPY DIAG:  Aftercare following surgery for neoplasm  Stiffness of left shoulder, not elsewhere classified  Muscle weakness (generalized)  Stiffness of right shoulder, not elsewhere classified  ONSET DATE: 07/2023  Rationale for Evaluation and Treatment: Rehabilitation  SUBJECTIVE:  SUBJECTIVE STATEMENT My skin is getting redder on my Lt chest. I can't sleep in my compression bra anymore because it just rubs on that reddened skin. The kinesiotape on the Rt side is really helping the Rt lateral trunk edema.   Eval: Pt wants to have the best possible outcome and wants to get through radiation as good as possible  Pt  wants to improve her posture as she is adjusting to the new body image.  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 The 5 o'clock mass had grade 3 invasive ductal carcinoma that is ER/PR positive, her 2 negative, and Ki 67 60%. The 10:30 mass was grade 2 invasive ductal carcinoma, Er/PR +, her 2 negative and Ki 67 30%. Pt had neoadjuvant chemotherapy during which she had about 30# weight loss.  She metastasis to liver and underwent a liver ablation 01/16/2024. Pt had bilataeral mastectomy on 3/20/ with no lymph nodes removed.  She will start radiation this week.    PAIN:  Are you having pain? No, just tenderness at Lt chest wall at area of radiation  PRECAUTIONS: Other: recent surgery, will be having radiaiton   WEIGHT BEARING RESTRICTIONS: No  FALLS:  Has patient fallen in last 6 months? 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  Mom had had bilateral mastectomies and bilateral knee replacement and colon cancer   HAND DOMINANCE: right   PRIOR LEVEL OF FUNCTION: Independent  PATIENT GOALS: pt want to improve her range of motion and have the best possible outcome from surgery and wants to impove her general strenth    OBJECTIVE: Note: Objective measures were completed at Evaluation unless otherwise noted.  COGNITION: Overall cognitive status: Within functional limits for tasks assessed   PALPATION: Mild fullness at lateral chest below incision on both sides with more on left side   OBSERVATIONS / OTHER ASSESSMENTS: pt with well healing inciscion across front of  chest with steristrips still intact in central portion.  Bilateral drain sites with scab intacdt   SENSATION: Pt reports numbness across front of chest and lateral chest   POSTURE: forward head and shoulders . Left ribs rotated forward and prominent in standing. Pt had decreased scapular stabalization on left with scapula moving away from midline with active left shoulder elevation   UPPER EXTREMITY AROM/PROM:  A/PROM RIGHT   eval   Shoulder extension 65  Shoulder flexion 140  Shoulder abduction 135  Shoulder internal rotation   Shoulder external rotation 90    (Blank rows = not tested)  A/PROM LEFT   eval  Shoulder extension 65  Shoulder flexion 155  Shoulder abduction 170  Shoulder internal rotation   Shoulder external rotation 90    (Blank rows = not tested)  CERVICAL AROM: All within normal limits:    UPPER EXTREMITY STRENGTH: shoulders 3-5/5 due to pain from surgery . Otherwise 4+/5 pt reports general weakness from chemo  as she feels she has lost muscle mass   LYMPHEDEMA ASSESSMENTS:   SURGERY TYPE/DATE: 01/24/2024 bilateral mastectomy   NUMBER OF LYMPH NODES REMOVED: 0  CHEMOTHERAPY: yes  RADIATION:yes, will start this week   HORMONE TREATMENT: yes  INFECTIONS: no  LYMPHEDEMA ASSESSMENTS:   LANDMARK RIGHT  eval  10 cm proximal to olecranon process 27  Olecranon process 23  10 cm proximal to ulnar styloid process 19.5  Just proximal to ulnar styloid process 15.3  Across hand at thumb web space 19  At base of 2nd digit 5.8  (Blank rows = not tested)  LANDMARK LEFT  eval  10 cm proximal to olecranon process 27  Olecranon process 24  10 cm proximal to ulnar styloid process 19.7  Just proximal to ulnar styloid process 15.7  Across hand at thumb web space 18.7  At base of 2nd digit 5.8  (Blank rows = not tested)    FUNCTIONAL TESTS:  30 seconds chair stand test  13 stit to stand in 30 seconds from low mat height      QUICK DASH SURVEY:  40.91                                                                                                                             TREATMENT DATE:  03/19/24: Therapeutic Exercise Roll yellow ball up wall into flex and abd x 10 each Modified downward dog on wall x5 reps, 5 sec holds Doorway pectoralis stretch x 5 reps, 20 sec each Therapeutic Activities Free Motion Machine 7#  2 x 10 with core engaged and in mini squat position returning therapist demo, then bil shoulder ext 2 x 10 reps, 3# with VC's for scapular engagement at end motions; tricep ext with 7# x 10 each, Lt then Rt returning therapist demo and VC's during for correct muscle engagement Prone "I, Y, and T's" 2 x 5 each with 1#  Manual Therapy P/ROM: In supine to Lt shoulder into flex, abd and D2 with scapular depression throughout, then same to Rt side and spent more time on Rt side MFR to cording that extends from Rt mastectomy incision to axilla and into medial upper arm with prolonged holds when Rt UE in end range stretch; some improvement continued to be noted with this today STM to each pectoralis insertion where palpably tight, avoiding area of port on Rt side and area of radiation on Lt side MLD: Short neck, 5 diaphragmatic breaths, Rt inguinal nodes, Rt axillo-inguinal anastomosis, and focused on small area of edema at Rt lateral trunk throughout P/ROM and STM  03/17/24: Therapeutic Exercise Roll yellow ball up wall into flex and abd x 10 each Modified downward dog on wall x5 reps, 5 sec holds Doorway pectoralis stretch x 5 reps, 20 sec each Therapeutic Activities  Prone "Y's and I's" 2 x 5 each with 1# and forehead on rolled towel, handout issued Free Motion Machine 7#  2 x 10 with core  engaged and in mini squat position returning therapist demo, then bil shoulder ext x 10 reps, 3# with VC's for scapular engagement at end motions Manual Therapy P/ROM: In supine to Lt shoulder into flex, abd and D2 with scapular  depression throughout, then same to Rt side and spent more time on Rt side MFR to cording that extends from Rt mastectomy incision to axilla and into medial upper arm with prolonged holds when Rt UE in end range stretch; some improvement noted with this today STM to each pectoralis insertion where palpably tight, avoiding area of port on Rt side and area of radiation on Lt side MLD: Short neck, 5 diaphragmatic breaths, Rt inguinal nodes, Rt axillo-inguinal anastomosis, and focused on small area of edema at Rt lateral trunk Kinesiotape along Rt axillo-inguinal anastomosis with bucket inferior to transverse watershed and 3 fingers running up to edema at lateral trunk (2 fingers inferior and 1 superior to mastectomy incision)  03/13/24: Therapeutic Exercises Pulleys into flex and abd x 2 mins each reminding pt to decrease bil scapular compensations with end range stretches Roll yellow ball up wall into flex and abd x 10 each Therapeutic Activities Standing with back against wall/core engaged, and head and shoulders back against wall for bil UE 3 way raises with 2# to shoulder height only into flex, scaption and abd x 10 each returning therapist demo Wall Push Ups with VC's for cervical retraction and elbows staying down to decrease scap compensation, x 10 reps Modified Downward Dog on wall Manual Therapy  P/ROM: In supine to Lt shoulder into flex, abd and D2 with scapular depression throughout, then same to Rt side and spent more time on Rt side MFR to cording that extends from Rt mastectomy incision to axilla and into medial upper arm with prolonged holds when Rt UE in end range stretch and relaxed on mat STM to each pectoralis insertion where palpably tight, avoiding area of port on Rt side MLD: Short neck, 5 diaphragmatic breaths, Rt inguinal nodes, Rt axillo-inguinal anastomosis, and focused on small area of edema at Rt lateral trunk Issued another piece of K-Tape as pts was still in place and not  coming off yet. Instructed her how to apply. She was able to verbalize good understanding.       PATIENT EDUCATION:  Education details: Prone "I, Y, and T's"  Person educated: Patient Education method: Explanation, Demonstration, Tactile cues, Verbal cues, and Handouts Education comprehension: verbalized understanding and returned demonstration and VC's during, will benefit from further review  HOME EXERCISE PROGRAM: Supine dowel exercise Supine scapular series  Standing bil UE 3 way raises Prone "I, Y, and T's"  ASSESSMENT:  CLINICAL IMPRESSION: Pts skin is becoming redder and some discomfort being reported now so avoided STM to Lt chest wall in radiated area. Progressed postural/scapular strength and stability, see above. Pt was challenged by new exercises and reports noting her posture awareness is also improved. Cording is also some improved in general though still tight and limiting to her end Rt shoulder ROM. Her kinesiotape was still in place from [place] so left this. Pt still has piece issued to her last week that she can reapply this weekend prn.     OBJECTIVE IMPAIRMENTS: decreased endurance, decreased ROM, decreased strength, increased edema, increased fascial restrictions, impaired perceived functional ability, impaired UE functional use, postural dysfunction, and pain.   ACTIVITY LIMITATIONS: carrying, lifting, reach over head, hygiene/grooming, and endurance   PARTICIPATION LIMITATIONS: will be having radiaiton   PERSONAL FACTORS:  3+ comorbidities: past chemo, liver mets and monitoring, will have radiation are also affecting patient's functional outcome.   REHAB POTENTIAL: Good  CLINICAL DECISION MAKING: Evolving/moderate complexity  EVALUATION COMPLEXITY: Moderate  GOALS: Goals reviewed with patient? Yes  SHORT TERM GOALS: Target date: 03/20/2024  Right and left shoulder flexion will be 160  Baseline: right is 140, left is 155 Goal status: INITIAL  2.  Pt  will be independent in a basic home exercise program for shoulder ROM  Baseline: no knowledge  Goal status: INITIAL   LONG TERM GOALS: Target date: 04/21/2024  Pt will have painfree right shoulder abduction of 170 degrees  Baseline: 135 with pain  Goal status: INITIAL  2.  Pt will be independent in managing trunk fullness with compression and manual lymph drainage  Baseline: no knowledge Goal status: INITIAL  3.  Pt will be independent in a stregnthening exercises program that she can do at home  Baseline: no knowledge  Goal status: INITIAL  4.  Pt will decrease Quick Dash score to < 10  Baseline: 40.91 Goal status: INITIAL  5.  Pt will increase 30 second sit to stand to 16 Baseline: 13 Goal status: INITIAL  PLAN:  PT FREQUENCY: 2x/week  PT DURATION: 8 weeks  PLANNED INTERVENTIONS: 97164- PT Re-evaluation, 97750- Physical Performance Testing, 97110-Therapeutic exercises, 97530- Therapeutic activity, 97112- Neuromuscular re-education, 97535- Self Care, [id number]- Manual therapy, Patient/Family education, Manual lymph drainage, Scar mobilization, and Compression bandaging  PLAN FOR NEXT SESSION:  Review prone "I, Y, T's" prn; cont manual therapy focus on right side and cording in right axilla and pocket of lateral trunk edema; P/AA/AROM to both shoulders. Later instruct in Strength ABC Program, possibly near the end of radiation.    Denyce Flank, PTA 03/19/2024, 1:08 PM   [facility] [address] [place], [place], [address] Phone: [phone]   Fax:  [phone]   CHEST: Doorway, Bilateral - Standing    Standing in doorway with one foot in front of the other, place hands on wall with elbows bent at shoulder height. Lean forward. Hold _20-30__ seconds. _2-3__ reps per set, _2__ sets per day.  Strengthening: Horizontal Abduction - with External Rotation (Prone)    Holding __1-2__ pound weights, raise arms out from  sides, pinching shoulder blades. Keep elbows straight, thumbs up. This is "T" position. Repeat __10__ times per set. Do _1-2___ sets per session. Do __1__ sessions every other day.  Then do same with arms in "Y" position, and last "I" position.     Cancer Rehab [phone]

## 2024-03-20 ENCOUNTER — Ambulatory Visit
Admission: RE | Admit: 2024-03-20 | Discharge: 2024-03-20 | Disposition: A | Discharge status: Home / Self Care | Source: Ambulatory Visit | Attending: Radiation Oncology

## 2024-03-20 ENCOUNTER — Other Ambulatory Visit: Payer: Self-pay

## 2024-03-20 ENCOUNTER — Encounter: Payer: Self-pay | Admitting: Hematology and Oncology

## 2024-03-20 DIAGNOSIS — C50212 Malignant neoplasm of upper-inner quadrant of left female breast: Secondary | ICD-10-CM | POA: Diagnosis not present

## 2024-03-20 LAB — RAD ONC ARIA SESSION SUMMARY
Course Elapsed Days: 16
Plan Fractions Treated to Date: 13
Plan Fractions Treated to Date: 7
Plan Prescribed Dose Per Fraction: 2 Gy
Plan Prescribed Dose Per Fraction: 2 Gy
Plan Total Fractions Prescribed: 13
Plan Total Fractions Prescribed: 25
Plan Total Prescribed Dose: 26 Gy
Plan Total Prescribed Dose: 50 Gy
Reference Point Dosage Given to Date: 14 Gy
Reference Point Dosage Given to Date: 26 Gy
Reference Point Session Dosage Given: 2 Gy
Reference Point Session Dosage Given: 2 Gy
Session Number: 13

## 2024-03-21 ENCOUNTER — Other Ambulatory Visit: Payer: Self-pay

## 2024-03-21 ENCOUNTER — Ambulatory Visit
Admission: RE | Admit: 2024-03-21 | Discharge: 2024-03-21 | Disposition: A | Discharge status: Home / Self Care | Source: Ambulatory Visit | Attending: Radiation Oncology | Admitting: Radiation Oncology

## 2024-03-21 DIAGNOSIS — C50212 Malignant neoplasm of upper-inner quadrant of left female breast: Secondary | ICD-10-CM | POA: Diagnosis not present

## 2024-03-21 LAB — RAD ONC ARIA SESSION SUMMARY
Course Elapsed Days: 17
Plan Fractions Treated to Date: 14
Plan Fractions Treated to Date: 7
Plan Prescribed Dose Per Fraction: 2 Gy
Plan Prescribed Dose Per Fraction: 2 Gy
Plan Total Fractions Prescribed: 12
Plan Total Fractions Prescribed: 25
Plan Total Prescribed Dose: 24 Gy
Plan Total Prescribed Dose: 50 Gy
Reference Point Dosage Given to Date: 14 Gy
Reference Point Dosage Given to Date: 28 Gy
Reference Point Session Dosage Given: 2 Gy
Reference Point Session Dosage Given: 2 Gy
Session Number: 14

## 2024-03-24 ENCOUNTER — Other Ambulatory Visit: Payer: Self-pay

## 2024-03-24 ENCOUNTER — Ambulatory Visit
Admission: RE | Admit: 2024-03-24 | Discharge: 2024-03-24 | Disposition: A | Discharge status: Home / Self Care | Source: Ambulatory Visit | Attending: Radiation Oncology | Admitting: Radiation Oncology

## 2024-03-24 ENCOUNTER — Ambulatory Visit

## 2024-03-24 DIAGNOSIS — Z510 Encounter for antineoplastic radiation therapy: Secondary | ICD-10-CM | POA: Diagnosis not present

## 2024-03-24 DIAGNOSIS — C50212 Malignant neoplasm of upper-inner quadrant of left female breast: Secondary | ICD-10-CM | POA: Diagnosis not present

## 2024-03-24 DIAGNOSIS — Z170 Estrogen receptor positive status [ER+]: Secondary | ICD-10-CM | POA: Diagnosis not present

## 2024-03-24 LAB — RAD ONC ARIA SESSION SUMMARY
Course Elapsed Days: 20
Plan Fractions Treated to Date: 15
Plan Fractions Treated to Date: 8
Plan Prescribed Dose Per Fraction: 2 Gy
Plan Prescribed Dose Per Fraction: 2 Gy
Plan Total Fractions Prescribed: 13
Plan Total Fractions Prescribed: 25
Plan Total Prescribed Dose: 26 Gy
Plan Total Prescribed Dose: 50 Gy
Reference Point Dosage Given to Date: 16 Gy
Reference Point Dosage Given to Date: 30 Gy
Reference Point Session Dosage Given: 2 Gy
Reference Point Session Dosage Given: 2 Gy
Session Number: 15

## 2024-03-25 ENCOUNTER — Ambulatory Visit
Admission: RE | Admit: 2024-03-25 | Discharge: 2024-03-25 | Disposition: A | Discharge status: Home / Self Care | Source: Ambulatory Visit | Attending: Radiation Oncology | Admitting: Radiation Oncology

## 2024-03-25 ENCOUNTER — Ambulatory Visit

## 2024-03-25 ENCOUNTER — Other Ambulatory Visit: Payer: Self-pay

## 2024-03-25 DIAGNOSIS — M25611 Stiffness of right shoulder, not elsewhere classified: Secondary | ICD-10-CM | POA: Diagnosis not present

## 2024-03-25 DIAGNOSIS — Z483 Aftercare following surgery for neoplasm: Secondary | ICD-10-CM

## 2024-03-25 DIAGNOSIS — M25612 Stiffness of left shoulder, not elsewhere classified: Secondary | ICD-10-CM | POA: Diagnosis not present

## 2024-03-25 DIAGNOSIS — C50212 Malignant neoplasm of upper-inner quadrant of left female breast: Secondary | ICD-10-CM | POA: Diagnosis not present

## 2024-03-25 DIAGNOSIS — M6281 Muscle weakness (generalized): Secondary | ICD-10-CM

## 2024-03-25 LAB — RAD ONC ARIA SESSION SUMMARY
Course Elapsed Days: 21
Plan Fractions Treated to Date: 16
Plan Fractions Treated to Date: 8
Plan Prescribed Dose Per Fraction: 2 Gy
Plan Prescribed Dose Per Fraction: 2 Gy
Plan Total Fractions Prescribed: 12
Plan Total Fractions Prescribed: 25
Plan Total Prescribed Dose: 24 Gy
Plan Total Prescribed Dose: 50 Gy
Reference Point Dosage Given to Date: 16 Gy
Reference Point Dosage Given to Date: 32 Gy
Reference Point Session Dosage Given: 2 Gy
Reference Point Session Dosage Given: 2 Gy
Session Number: 16

## 2024-03-25 NOTE — Therapy (Signed)
 [facility] [address] [place], [place], [address] Phone: [phone]   Fax:  [phone]  Patient Details  Name: Ann Daugherty MRN: [id number] Date of Birth: 1971/04/02 Referring Provider:  Lockie Rima, MD  Encounter Date: 03/25/2024  OUTPATIENT PHYSICAL THERAPY ONCOLOGY TREATMENT  Patient Name: Ann Daugherty MRN: [id number] DOB:1971/01/16, 53 y.o., female Today's Date: 03/25/2024  END OF SESSION:  PT End of Session - 03/25/24 0900     Visit Number 8    Number of Visits 17    Date for PT Re-Evaluation 04/20/24    PT Start Time 0857    PT Stop Time 0958    PT Time Calculation (min) 61 min    Activity Tolerance Patient tolerated treatment well    Behavior During Therapy [facility] for tasks assessed/performed              Past Medical History:  Diagnosis Date   ADD (attention deficit disorder) 2015   Does not take any medication   Anxiety    Cancer (HCC)    breast   Depression    GERD (gastroesophageal reflux disease)    History of chicken pox    PONV (postoperative nausea and vomiting)    Past Surgical History:  Procedure Laterality Date   BACK SURGERY     BREAST BIOPSY Left 07/19/2023   US  LT BREAST BX W LOC DEV EA ADD LESION IMG BX SPEC US  GUIDE 07/19/2023 [facility]   BREAST BIOPSY Left 07/19/2023   US  LT BREAST BX W LOC DEV 1ST LESION IMG BX SPEC US  GUIDE 07/19/2023 [facility]   BREAST BIOPSY Right 08/09/2023   US  RT BREAST BX W LOC DEV 1ST LESION IMG BX SPEC US  GUIDE 08/09/2023 [facility]   BREAST BIOPSY Right 08/09/2023   US  RT BREAST BX W LOC DEV EA ADD LESION IMG BX SPEC US  GUIDE 08/09/2023 [facility]   IR GUIDED LIVER TUMOR ABLATION RFA PERC     IR RADIOLOGIST EVAL & MGMT  01/08/2024   IR RADIOLOGIST EVAL & MGMT  01/31/2024   LAMINECTOMY     2007, 2011   PORTACATH PLACEMENT Right 08/14/2023   Procedure: INSERTION PORT-A-CATH WITH ULTRASOUND GUIDEANCE;  Surgeon: Lockie Rima,  MD;  Location: [facility];  Service: General;  Laterality: Right;   SIMPLE MASTECTOMY WITH AXILLARY SENTINEL NODE BIOPSY Bilateral 01/24/2024   Procedure: BILATERAL MASTECTOMY;  Surgeon: Lockie Rima, MD;  Location: [facility];  Service: General;  Laterality: Bilateral;  PEC BLOCK   VAGINAL DELIVERY  2004, 2006   Patient Active Problem List   Diagnosis Date Noted   Primary cancer of upper inner quadrant of left breast (HCC) 01/24/2024   Port-A-Cath in place 08/15/2023   Malignant neoplasm of upper-inner quadrant of left female breast (HCC) 07/20/2023   Depression, major, single episode, mild (HCC) 04/05/2021   Tobacco abuse 01/30/2018   Anxiety 01/30/2018   Attention deficit disorder 01/30/2018   Vitamin D  deficiency 01/30/2018    PCP:   REFERRING PROVIDER: Lockie Rima, MD  REFERRING DIAG: C50.112 (ICD-10-CM) - Malignant neoplasm of central portion of left female breast Z17.0 (ICD-10-CM) - Estrogen receptor positive status (ER+)  THERAPY DIAG:  Aftercare following surgery for neoplasm  Stiffness of left shoulder, not elsewhere classified  Muscle weakness (generalized)  Stiffness of right shoulder, not elsewhere classified  ONSET DATE: 07/2023  Rationale for Evaluation and Treatment: Rehabilitation  SUBJECTIVE:  SUBJECTIVE STATEMENT I found out I will be having boost treatments for my last 5 days. I'm just starting to notice my skin changing a little. I'm starting to feel tightness at my Lt chest from it too.   Eval: Pt wants to have the best possible outcome and wants to get through radiation as good as possible  Pt  wants to improve her posture as she is adjusting to the new body image.  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 The 5 o'clock mass had grade 3 invasive ductal carcinoma that is ER/PR positive, her 2 negative, and Ki 67 60%. The 10:30 mass was grade 2 invasive ductal carcinoma, Er/PR +, her 2 negative and Ki 67 30%. Pt had neoadjuvant chemotherapy during which she had about 30# weight loss.  She metastasis to liver and underwent a liver ablation 01/16/2024. Pt had bilataeral mastectomy on 3/20/ with no lymph nodes removed.  She will start radiation this week.    PAIN:  Are you having pain? No, just tenderness at Lt chest wall at area of radiation  PRECAUTIONS: Other: recent surgery, will be having radiaiton   WEIGHT BEARING RESTRICTIONS: No  FALLS:  Has patient fallen in last 6 months? 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  Mom had had bilateral mastectomies and bilateral knee replacement and colon cancer   HAND DOMINANCE: right   PRIOR LEVEL OF FUNCTION: Independent  PATIENT GOALS: pt want to improve her range of motion and have the best possible outcome from surgery and wants to impove her general strenth    OBJECTIVE: Note: Objective measures were completed at Evaluation unless otherwise noted.  COGNITION: Overall cognitive status: Within functional limits for tasks assessed   PALPATION: Mild fullness at lateral chest below incision on both sides with more on left side   OBSERVATIONS / OTHER ASSESSMENTS: pt with well healing inciscion across front of chest with steristrips still  intact in central portion.  Bilateral drain sites with scab intacdt   SENSATION: Pt reports numbness across front of chest and lateral chest   POSTURE: forward head and shoulders . Left ribs rotated forward and prominent in standing. Pt had decreased scapular stabalization on left with scapula moving away from midline with active left shoulder elevation   UPPER EXTREMITY AROM/PROM:  A/PROM RIGHT   eval   Shoulder extension 65  Shoulder flexion 140  Shoulder abduction 135  Shoulder internal rotation   Shoulder external rotation 90    (Blank rows = not tested)  A/PROM LEFT   eval  Shoulder extension 65  Shoulder flexion 155  Shoulder abduction 170  Shoulder internal rotation   Shoulder external rotation 90    (Blank rows = not tested)  CERVICAL AROM: All within normal limits:    UPPER EXTREMITY STRENGTH: shoulders 3-5/5 due to pain from surgery . Otherwise 4+/5 pt reports general weakness from chemo as she feels  she has lost muscle mass   LYMPHEDEMA ASSESSMENTS:   SURGERY TYPE/DATE: 01/24/2024 bilateral mastectomy   NUMBER OF LYMPH NODES REMOVED: 0  CHEMOTHERAPY: yes  RADIATION:yes, will start this week   HORMONE TREATMENT: yes  INFECTIONS: no  LYMPHEDEMA ASSESSMENTS:   LANDMARK RIGHT  eval  10 cm proximal to olecranon process 27  Olecranon process 23  10 cm proximal to ulnar styloid process 19.5  Just proximal to ulnar styloid process 15.3  Across hand at thumb web space 19  At base of 2nd digit 5.8  (Blank rows = not tested)  LANDMARK LEFT  eval  10 cm proximal to olecranon process 27  Olecranon process 24  10 cm proximal to ulnar styloid process 19.7  Just proximal to ulnar styloid process 15.7  Across hand at thumb web space 18.7  At base of 2nd digit 5.8  (Blank rows = not tested)    FUNCTIONAL TESTS:  30 seconds chair stand test  13 stit to stand in 30 seconds from low mat height      QUICK DASH SURVEY: 40.91                                                                                                                              TREATMENT DATE:  03/25/24; Therapeutic Exercise Roll yellow ball up wall into flex and abd x 10 each Modified downward dog on wall x5 reps, 5 sec holds Doorway pectoralis stretch x 5 reps, 20 sec each at varying heights Therapeutic Activities Free Motion Machine 7#  2 x 10 with core engaged and in mini squat position standing on airex returning therapist demo, then bil shoulder ext 2 x 10 reps, 7# with good technique Supine over full foam roll for following for increased A/ROM and improved posture: Bil UE horz abd x 10, bil UE scaption into a "V" x 10, and then bil UE abd in a "snow angel" x 10, 5 sec holds returning therapist demo for review for each Manual Therapy P/ROM: In supine to Lt shoulder into flex, abd and D2 with scapular depression throughout, then same to Rt side and spent more time on Rt side MFR to cording that extends from Rt mastectomy incision to axilla and into medial upper arm with prolonged holds when Rt UE in end range stretch; some improvement continued to be noted with this today STM to each pectoralis insertion where palpably tight, avoiding area of port on Rt side and area of radiation on Lt side MLD: Short neck, 5 diaphragmatic breaths, Rt inguinal nodes, Rt axillo-inguinal anastomosis, and focused on small area of edema at Rt lateral trunk throughout P/ROM and STM; this area of edema much smaller today. Pt was able to tolerate wearing her compression bra yesterday  03/19/24: Therapeutic Exercise Roll yellow ball up wall into flex and abd x 10 each Modified downward dog on wall x5 reps, 5 sec holds Doorway pectoralis stretch x 5 reps, 20 sec  each Therapeutic Activities Free Motion Machine 7#  2 x 10 with core engaged and in mini squat position returning therapist demo, then bil shoulder ext 2 x 10 reps, 3# with VC's for scapular engagement at end motions; tricep ext with  7# x 10 each, Lt then Rt returning therapist demo and VC's during for correct muscle engagement Prone "I, Y, and T's" 2 x 5 each with 1#  Manual Therapy P/ROM: In supine to Lt shoulder into flex, abd and D2 with scapular depression throughout, then same to Rt side and spent more time on Rt side MFR to cording that extends from Rt mastectomy incision to axilla and into medial upper arm with prolonged holds when Rt UE in end range stretch; some improvement continued to be noted with this today STM to each pectoralis insertion where palpably tight, avoiding area of port on Rt side and area of radiation on Lt side MLD: Short neck, 5 diaphragmatic breaths, Rt inguinal nodes, Rt axillo-inguinal anastomosis, and focused on small area of edema at Rt lateral trunk throughout P/ROM and STM  03/17/24: Therapeutic Exercise Roll yellow ball up wall into flex and abd x 10 each Modified downward dog on wall x5 reps, 5 sec holds Doorway pectoralis stretch x 5 reps, 20 sec each Therapeutic Activities  Prone "Y's and I's" 2 x 5 each with 1# and forehead on rolled towel, handout issued Free Motion Machine 7#  2 x 10 with core engaged and in mini squat position returning therapist demo, then bil shoulder ext x 10 reps, 3# with VC's for scapular engagement at end motions Manual Therapy P/ROM: In supine to Lt shoulder into flex, abd and D2 with scapular depression throughout, then same to Rt side and spent more time on Rt side MFR to cording that extends from Rt mastectomy incision to axilla and into medial upper arm with prolonged holds when Rt UE in end range stretch; some improvement noted with this today STM to each pectoralis insertion where palpably tight, avoiding area of port on Rt side and area of radiation on Lt side MLD: Short neck, 5 diaphragmatic breaths, Rt inguinal nodes, Rt axillo-inguinal anastomosis, and focused on small area of edema at Rt lateral trunk Kinesiotape along Rt axillo-inguinal  anastomosis with bucket inferior to transverse watershed and 3 fingers running up to edema at lateral trunk (2 fingers inferior and 1 superior to mastectomy incision)      PATIENT EDUCATION:  Education details: Prone "I, Y, and T's"  Person educated: Patient Education method: Explanation, Demonstration, Tactile cues, Verbal cues, and Handouts Education comprehension: verbalized understanding and returned demonstration and VC's during, will benefit from further review  HOME EXERCISE PROGRAM: Supine dowel exercise Supine scapular series  Standing bil UE 3 way raises Prone "I, Y, and T's"  ASSESSMENT:  CLINICAL IMPRESSION: Pt reports her sternum and ribs are starting to really feel sore but she was able to tolerate all exercises well and reports feeling good stretches with these. She does notice her end Lt shoulder A/ROM is beginning to become limited due to tightness. Pt will benefit from continued physical therapy as she conts to undergo radiation due to the effects of radiation will cont to affect her tissue elasticity. PT will help her to limit loss of end ROM.     OBJECTIVE IMPAIRMENTS: decreased endurance, decreased ROM, decreased strength, increased edema, increased fascial restrictions, impaired perceived functional ability, impaired UE functional use, postural dysfunction, and pain.   ACTIVITY LIMITATIONS: carrying, lifting, reach over  head, hygiene/grooming, and endurance   PARTICIPATION LIMITATIONS: will be having radiaiton   PERSONAL FACTORS: 3+ comorbidities: past chemo, liver mets and monitoring, will have radiation are also affecting patient's functional outcome.   REHAB POTENTIAL: Good  CLINICAL DECISION MAKING: Evolving/moderate complexity  EVALUATION COMPLEXITY: Moderate  GOALS: Goals reviewed with patient? Yes  SHORT TERM GOALS: Target date: 03/20/2024  Right and left shoulder flexion will be 160  Baseline: right is 140, left is 155 Goal status:  INITIAL  2.  Pt will be independent in a basic home exercise program for shoulder ROM  Baseline: no knowledge  Goal status: INITIAL   LONG TERM GOALS: Target date: 04/21/2024  Pt will have painfree right shoulder abduction of 170 degrees  Baseline: 135 with pain  Goal status: INITIAL  2.  Pt will be independent in managing trunk fullness with compression and manual lymph drainage  Baseline: no knowledge Goal status: INITIAL  3.  Pt will be independent in a stregnthening exercises program that she can do at home  Baseline: no knowledge  Goal status: INITIAL  4.  Pt will decrease Quick Dash score to < 10  Baseline: 40.91 Goal status: INITIAL  5.  Pt will increase 30 second sit to stand to 16 Baseline: 13 Goal status: INITIAL  PLAN:  PT FREQUENCY: 2x/week  PT DURATION: 8 weeks  PLANNED INTERVENTIONS: 97164- PT Re-evaluation, 97750- Physical Performance Testing, 97110-Therapeutic exercises, 97530- Therapeutic activity, 97112- Neuromuscular re-education, 97535- Self Care, 40981- Manual therapy, Patient/Family education, Manual lymph drainage, Scar mobilization, and Compression bandaging  PLAN FOR NEXT SESSION:  Review prone "I, Y, T's" prn; cont manual therapy focus on right side and cording in right axilla and pocket of lateral trunk edema; P/AA/AROM to both shoulders. Later instruct in Strength ABC Program, possibly near the end of radiation.    Denyce Flank, PTA 03/25/2024, 10:00 AM   [facility] [address] [place], [place], [address] Phone: [phone]   Fax:  [phone]   CHEST: Doorway, Bilateral - Standing    Standing in doorway with one foot in front of the other, place hands on wall with elbows bent at shoulder height. Lean forward. Hold _20-30__ seconds. _2-3__ reps per set, _2__ sets per day.  Strengthening: Horizontal Abduction - with External Rotation (Prone)    Holding __1-2__ pound weights,  raise arms out from sides, pinching shoulder blades. Keep elbows straight, thumbs up. This is "T" position. Repeat __10__ times per set. Do _1-2___ sets per session. Do __1__ sessions every other day.  Then do same with arms in "Y" position, and last "I" position.     Cancer Rehab [phone]

## 2024-03-26 ENCOUNTER — Other Ambulatory Visit: Payer: Self-pay

## 2024-03-26 ENCOUNTER — Ambulatory Visit
Admission: RE | Admit: 2024-03-26 | Discharge: 2024-03-26 | Disposition: A | Discharge status: Home / Self Care | Source: Ambulatory Visit | Attending: Radiation Oncology | Admitting: Radiation Oncology

## 2024-03-26 DIAGNOSIS — C50212 Malignant neoplasm of upper-inner quadrant of left female breast: Secondary | ICD-10-CM | POA: Diagnosis not present

## 2024-03-26 LAB — RAD ONC ARIA SESSION SUMMARY
Course Elapsed Days: 22
Plan Fractions Treated to Date: 17
Plan Fractions Treated to Date: 9
Plan Prescribed Dose Per Fraction: 2 Gy
Plan Prescribed Dose Per Fraction: 2 Gy
Plan Total Fractions Prescribed: 13
Plan Total Fractions Prescribed: 25
Plan Total Prescribed Dose: 26 Gy
Plan Total Prescribed Dose: 50 Gy
Reference Point Dosage Given to Date: 18 Gy
Reference Point Dosage Given to Date: 34 Gy
Reference Point Session Dosage Given: 2 Gy
Reference Point Session Dosage Given: 2 Gy
Session Number: 17

## 2024-03-27 ENCOUNTER — Other Ambulatory Visit: Payer: Self-pay

## 2024-03-27 ENCOUNTER — Ambulatory Visit

## 2024-03-27 ENCOUNTER — Ambulatory Visit
Admission: RE | Admit: 2024-03-27 | Discharge: 2024-03-27 | Disposition: A | Discharge status: Home / Self Care | Source: Ambulatory Visit | Attending: Radiation Oncology | Admitting: Radiation Oncology

## 2024-03-27 DIAGNOSIS — M25611 Stiffness of right shoulder, not elsewhere classified: Secondary | ICD-10-CM | POA: Diagnosis not present

## 2024-03-27 DIAGNOSIS — M25612 Stiffness of left shoulder, not elsewhere classified: Secondary | ICD-10-CM | POA: Diagnosis not present

## 2024-03-27 DIAGNOSIS — C50212 Malignant neoplasm of upper-inner quadrant of left female breast: Secondary | ICD-10-CM | POA: Diagnosis not present

## 2024-03-27 DIAGNOSIS — Z483 Aftercare following surgery for neoplasm: Secondary | ICD-10-CM | POA: Diagnosis not present

## 2024-03-27 DIAGNOSIS — M6281 Muscle weakness (generalized): Secondary | ICD-10-CM | POA: Diagnosis not present

## 2024-03-27 LAB — RAD ONC ARIA SESSION SUMMARY
Course Elapsed Days: 23
Plan Fractions Treated to Date: 18
Plan Fractions Treated to Date: 9
Plan Prescribed Dose Per Fraction: 2 Gy
Plan Prescribed Dose Per Fraction: 2 Gy
Plan Total Fractions Prescribed: 12
Plan Total Fractions Prescribed: 25
Plan Total Prescribed Dose: 24 Gy
Plan Total Prescribed Dose: 50 Gy
Reference Point Dosage Given to Date: 18 Gy
Reference Point Dosage Given to Date: 36 Gy
Reference Point Session Dosage Given: 2 Gy
Reference Point Session Dosage Given: 2 Gy
Session Number: 18

## 2024-03-27 NOTE — Therapy (Signed)
 [facility] [address] [place], [place], [address] Phone: [phone]   Fax:  [phone]  Patient Details  Name: Ann Daugherty MRN: [id number] Date of Birth: Jan 07, 1971 Referring Provider:  Lockie Rima, MD  Encounter Date: 03/27/2024  OUTPATIENT PHYSICAL THERAPY ONCOLOGY TREATMENT  Patient Name: Ann Daugherty MRN: [id number] DOB:[birth year], 53 y.o., female Today's Date: 03/27/2024  END OF SESSION:  PT End of Session - 03/27/24 0907     Visit Number 9    Number of Visits 17    Date for PT Re-Evaluation 04/20/24    PT Start Time 0905    PT Stop Time 1002    PT Time Calculation (min) 57 min    Activity Tolerance Patient tolerated treatment well    Behavior During Therapy WFL for tasks assessed/performed              Past Medical History:  Diagnosis Date   ADD (attention deficit disorder) 2015   Does not take any medication   Anxiety    Cancer (HCC)    breast   Depression    GERD (gastroesophageal reflux disease)    History of chicken pox    PONV (postoperative nausea and vomiting)    Past Surgical History:  Procedure Laterality Date   BACK SURGERY     BREAST BIOPSY Left 07/19/2023   US  LT BREAST BX W LOC DEV EA ADD LESION IMG BX SPEC US  GUIDE 07/19/2023 [facility]   BREAST BIOPSY Left 07/19/2023   US  LT BREAST BX W LOC DEV 1ST LESION IMG BX SPEC US  GUIDE 07/19/2023 [facility]   BREAST BIOPSY Right 08/09/2023   US  RT BREAST BX W LOC DEV 1ST LESION IMG BX SPEC US  GUIDE 08/09/2023 [facility]   BREAST BIOPSY Right 08/09/2023   US  RT BREAST BX W LOC DEV EA ADD LESION IMG BX SPEC US  GUIDE 08/09/2023 [facility]   IR GUIDED LIVER TUMOR ABLATION RFA PERC     IR RADIOLOGIST EVAL & MGMT  01/08/2024   IR RADIOLOGIST EVAL & MGMT  01/31/2024   LAMINECTOMY     2007, 2011   PORTACATH PLACEMENT Right 08/14/2023   Procedure: INSERTION PORT-A-CATH WITH ULTRASOUND GUIDEANCE;  Surgeon: Lockie Rima,  MD;  Location: [facility];  Service: General;  Laterality: Right;   SIMPLE MASTECTOMY WITH AXILLARY SENTINEL NODE BIOPSY Bilateral 01/24/2024   Procedure: BILATERAL MASTECTOMY;  Surgeon: Lockie Rima, MD;  Location: [facility];  Service: General;  Laterality: Bilateral;  PEC BLOCK   VAGINAL DELIVERY  2004, 2006   Patient Active Problem List   Diagnosis Date Noted   Primary cancer of upper inner quadrant of left breast (HCC) 01/24/2024   Port-A-Cath in place 08/15/2023   Malignant neoplasm of upper-inner quadrant of left female breast (HCC) 07/20/2023   Depression, major, single episode, mild (HCC) 04/05/2021   Tobacco abuse 01/30/2018   Anxiety 01/30/2018   Attention deficit disorder 01/30/2018   Vitamin D  deficiency 01/30/2018    PCP:   REFERRING PROVIDER: Lockie Rima, MD  REFERRING DIAG: C50.112 (ICD-10-CM) - Malignant neoplasm of central portion of left female breast Z17.0 (ICD-10-CM) - Estrogen receptor positive status (ER+)  THERAPY DIAG:  Aftercare following surgery for neoplasm  Stiffness of left shoulder, not elsewhere classified  Muscle weakness (generalized)  Stiffness of right shoulder, not elsewhere classified  ONSET DATE: 07/2023  Rationale for Evaluation and Treatment: Rehabilitation  SUBJECTIVE:  SUBJECTIVE STATEMENT I can tell my skin is getting more and more sensitive but the nurse at radiation said it looks better than some people at this stage. I am still able to wear the compression bra during the day but I can not sleep in it at night due to skin discomfort and my hot flashes!  Eval: Pt wants to have the best possible outcome and wants to get through radiation as good as possible  Pt  wants to improve her posture as she is adjusting to the new body image.  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 The 5 o'clock mass had grade 3 invasive ductal carcinoma that is ER/PR positive, her 2 negative, and Ki 67 60%. The 10:30 mass was grade 2 invasive ductal carcinoma, Er/PR +, her 2 negative and Ki 67 30%. Pt had neoadjuvant chemotherapy during which she had about 30# weight loss.  She metastasis to liver and underwent a liver ablation 01/16/2024. Pt had bilataeral mastectomy on 3/20/ with no lymph nodes removed.  She will start radiation this week.    PAIN:  Are you having pain? No, just tenderness at Lt chest wall at area of radiation  PRECAUTIONS: Other: recent surgery, will be having radiaiton   WEIGHT BEARING RESTRICTIONS: No  FALLS:  Has patient fallen in last 6 months? 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  Mom had had bilateral mastectomies and bilateral knee replacement and colon cancer   HAND DOMINANCE: right   PRIOR LEVEL OF FUNCTION: Independent  PATIENT GOALS: pt want to improve her range of motion and have the best possible outcome from surgery and wants to impove her general strenth    OBJECTIVE: Note: Objective measures were completed at Evaluation unless otherwise noted.  COGNITION: Overall cognitive status: Within functional limits for tasks assessed   PALPATION: Mild fullness at lateral chest below incision on both sides with more on left side   OBSERVATIONS / OTHER  ASSESSMENTS: pt with well healing inciscion across front of chest with steristrips still intact in central portion.  Bilateral drain sites with scab intacdt   SENSATION: Pt reports numbness across front of chest and lateral chest   POSTURE: forward head and shoulders . Left ribs rotated forward and prominent in standing. Pt had decreased scapular stabalization on left with scapula moving away from midline with active left shoulder elevation   UPPER EXTREMITY AROM/PROM:  A/PROM RIGHT   eval   Shoulder extension 65  Shoulder flexion 140  Shoulder abduction 135  Shoulder internal rotation   Shoulder external rotation 90    (Blank rows = not tested)  A/PROM LEFT   eval  Shoulder extension 65  Shoulder flexion 155  Shoulder abduction 170  Shoulder internal rotation   Shoulder external rotation 90    (Blank rows = not tested)  CERVICAL AROM: All within normal limits:    UPPER EXTREMITY STRENGTH: shoulders  3-5/5 due to pain from surgery . Otherwise 4+/5 pt reports general weakness from chemo as she feels she has lost muscle mass   LYMPHEDEMA ASSESSMENTS:   SURGERY TYPE/DATE: 01/24/2024 bilateral mastectomy   NUMBER OF LYMPH NODES REMOVED: 0  CHEMOTHERAPY: yes  RADIATION:yes, will start this week   HORMONE TREATMENT: yes  INFECTIONS: no  LYMPHEDEMA ASSESSMENTS:   LANDMARK RIGHT  eval  10 cm proximal to olecranon process 27  Olecranon process 23  10 cm proximal to ulnar styloid process 19.5  Just proximal to ulnar styloid process 15.3  Across hand at thumb web space 19  At base of 2nd digit 5.8  (Blank rows = not tested)  LANDMARK LEFT  eval  10 cm proximal to olecranon process 27  Olecranon process 24  10 cm proximal to ulnar styloid process 19.7  Just proximal to ulnar styloid process 15.7  Across hand at thumb web space 18.7  At base of 2nd digit 5.8  (Blank rows = not tested)    FUNCTIONAL TESTS:  30 seconds chair stand test  13 stit to stand in 30  seconds from low mat height      QUICK DASH SURVEY: 40.91                                                                                                                             TREATMENT DATE:  03/27/24: Therapeutic Exercise Roll yellow ball up wall into flex and abd x 10 each Therapeutic Activities Supine over full foam roll for following for increased A/ROM and improved posture: Bil UE horz abd x 10, bil UE scaption into a "V" x 10, and then bil UE abd in a "snow angel" x 10, 5 sec holds returning therapist demo for review for each Prone laying diagonal on mat table: Rows with 2# 2 x 12, then 1# for I, Y, T's 2 x 12 with VC's for prolonged rest between sets. Pt reports feeling good joint distraction stretch between sets while holding weights. Manual Therapy P/ROM: In supine to Lt shoulder into flex, abd and D2 with scapular depression throughout, then same to Rt side and spent more time on Rt side MFR to cording that extends from Rt mastectomy incision to axilla and into medial upper arm with prolonged holds when Rt UE in end range stretch; some improvement continued to be noted with this today STM to each pectoralis insertion where palpably tight, avoiding area of port on Rt side and area of radiation on Lt side MLD: Short neck, 5 diaphragmatic breaths, Rt inguinal nodes, Rt axillo-inguinal anastomosis, and focused on small area of edema at Rt lateral trunk throughout P/ROM and STM Kinesiotape along Rt axillo-inguinal anastomosis with bucket inferior to transverse watershed and 3 fingers running up to edema at lateral trunk (2 fingers inferior and 1 superior to mastectomy incision)   03/25/24: Therapeutic Exercise Roll yellow ball up wall into flex and abd x 10 each Modified downward  dog on wall x5 reps, 5 sec holds Doorway pectoralis stretch x 5 reps, 20 sec each at varying heights Therapeutic Activities Free Motion Machine 7#  2 x 10 with core engaged and in mini squat position  standing on airex returning therapist demo, then bil shoulder ext 2 x 10 reps, 7# with good technique Supine over full foam roll for following for increased A/ROM and improved posture: Bil UE horz abd x 10, bil UE scaption into a "V" x 10, and then bil UE abd in a "snow angel" x 10, 5 sec holds returning therapist demo for review for each Manual Therapy P/ROM: In supine to Lt shoulder into flex, abd and D2 with scapular depression throughout, then same to Rt side and spent more time on Rt side MFR to cording that extends from Rt mastectomy incision to axilla and into medial upper arm with prolonged holds when Rt UE in end range stretch; some improvement continued to be noted with this today STM to each pectoralis insertion where palpably tight, avoiding area of port on Rt side and area of radiation on Lt side MLD: Short neck, 5 diaphragmatic breaths, Rt inguinal nodes, Rt axillo-inguinal anastomosis, and focused on small area of edema at Rt lateral trunk throughout P/ROM and STM; this area of edema much smaller today. Pt was able to tolerate wearing her compression bra yesterday  03/19/24: Therapeutic Exercise Roll yellow ball up wall into flex and abd x 10 each Modified downward dog on wall x5 reps, 5 sec holds Doorway pectoralis stretch x 5 reps, 20 sec each Therapeutic Activities Free Motion Machine 7#  2 x 10 with core engaged and in mini squat position returning therapist demo, then bil shoulder ext 2 x 10 reps, 3# with VC's for scapular engagement at end motions; tricep ext with 7# x 10 each, Lt then Rt returning therapist demo and VC's during for correct muscle engagement Prone "I, Y, and T's" 2 x 5 each with 1#  Manual Therapy P/ROM: In supine to Lt shoulder into flex, abd and D2 with scapular depression throughout, then same to Rt side and spent more time on Rt side MFR to cording that extends from Rt mastectomy incision to axilla and into medial upper arm with prolonged holds when Rt UE in  end range stretch; some improvement continued to be noted with this today STM to each pectoralis insertion where palpably tight, avoiding area of port on Rt side and area of radiation on Lt side MLD: Short neck, 5 diaphragmatic breaths, Rt inguinal nodes, Rt axillo-inguinal anastomosis, and focused on small area of edema at Rt lateral trunk throughout P/ROM and STM  03/17/24: Therapeutic Exercise Roll yellow ball up wall into flex and abd x 10 each Modified downward dog on wall x5 reps, 5 sec holds Doorway pectoralis stretch x 5 reps, 20 sec each Therapeutic Activities  Prone "Y's and I's" 2 x 5 each with 1# and forehead on rolled towel, handout issued Free Motion Machine 7#  2 x 10 with core engaged and in mini squat position returning therapist demo, then bil shoulder ext x 10 reps, 3# with VC's for scapular engagement at end motions Manual Therapy P/ROM: In supine to Lt shoulder into flex, abd and D2 with scapular depression throughout, then same to Rt side and spent more time on Rt side MFR to cording that extends from Rt mastectomy incision to axilla and into medial upper arm with prolonged holds when Rt UE in end range stretch; some  improvement noted with this today STM to each pectoralis insertion where palpably tight, avoiding area of port on Rt side and area of radiation on Lt side MLD: Short neck, 5 diaphragmatic breaths, Rt inguinal nodes, Rt axillo-inguinal anastomosis, and focused on small area of edema at Rt lateral trunk Kinesiotape along Rt axillo-inguinal anastomosis with bucket inferior to transverse watershed and 3 fingers running up to edema at lateral trunk (2 fingers inferior and 1 superior to mastectomy incision)      PATIENT EDUCATION:  Education details: Prone "I, Y, and T's"  Person educated: Patient Education method: Explanation, Demonstration, Tactile cues, Verbal cues, and Handouts Education comprehension: verbalized understanding and returned demonstration and  VC's during, will benefit from further review  HOME EXERCISE PROGRAM: Supine dowel exercise Supine scapular series  Standing bil UE 3 way raises Prone "I, Y, and T's"  ASSESSMENT:  CLINICAL IMPRESSION: Continued with AA/A/ROM stretching, postural strength and flexibility and manual therapy. Continued with MT working to decrease bil upper quadrant tightness and kinesiotape.     OBJECTIVE IMPAIRMENTS: decreased endurance, decreased ROM, decreased strength, increased edema, increased fascial restrictions, impaired perceived functional ability, impaired UE functional use, postural dysfunction, and pain.   ACTIVITY LIMITATIONS: carrying, lifting, reach over head, hygiene/grooming, and endurance   PARTICIPATION LIMITATIONS: will be having radiaiton   PERSONAL FACTORS: 3+ comorbidities: past chemo, liver mets and monitoring, will have radiation are also affecting patient's functional outcome.   REHAB POTENTIAL: Good  CLINICAL DECISION MAKING: Evolving/moderate complexity  EVALUATION COMPLEXITY: Moderate  GOALS: Goals reviewed with patient? Yes  SHORT TERM GOALS: Target date: 03/20/2024  Right and left shoulder flexion will be 160  Baseline: right is 140, left is 155 Goal status: INITIAL  2.  Pt will be independent in a basic home exercise program for shoulder ROM  Baseline: no knowledge  Goal status: INITIAL   LONG TERM GOALS: Target date: 04/21/2024  Pt will have painfree right shoulder abduction of 170 degrees  Baseline: 135 with pain  Goal status: INITIAL  2.  Pt will be independent in managing trunk fullness with compression and manual lymph drainage  Baseline: no knowledge Goal status: INITIAL  3.  Pt will be independent in a stregnthening exercises program that she can do at home  Baseline: no knowledge  Goal status: INITIAL  4.  Pt will decrease Quick Dash score to < 10  Baseline: 40.91 Goal status: INITIAL  5.  Pt will increase 30 second sit to stand to  16 Baseline: 13 Goal status: INITIAL  PLAN:  PT FREQUENCY: 2x/week  PT DURATION: 8 weeks  PLANNED INTERVENTIONS: 97164- PT Re-evaluation, 97750- Physical Performance Testing, 97110-Therapeutic exercises, 97530- Therapeutic activity, 97112- Neuromuscular re-education, 97535- Self Care, 30865- Manual therapy, Patient/Family education, Manual lymph drainage, Scar mobilization, and Compression bandaging  PLAN FOR NEXT SESSION:  Review prone "I, Y, T's" prn; cont manual therapy focus on right side and cording in right axilla and pocket of lateral trunk edema; P/AA/AROM to both shoulders. Later instruct in Strength ABC Program, possibly near the end of radiation.    Denyce Flank, PTA 03/27/2024, 10:09 AM   [facility] [address] [place], [place], [address] Phone: [phone]   Fax:  [phone]   CHEST: Doorway, Bilateral - Standing    Standing in doorway with one foot in front of the other, place hands on wall with elbows bent at shoulder height. Lean forward. Hold _20-30__ seconds. _2-3__ reps per set, _2__ sets per day.  Strengthening: Horizontal Abduction - with External Rotation (Prone)    Holding __1-2__ pound weights, raise arms out from sides, pinching shoulder blades. Keep elbows straight, thumbs up. This is "T" position. Repeat __10__ times per set. Do _1-2___ sets per session. Do __1__ sessions every other day.  Then do same with arms in "Y" position, and last "I" position.     Cancer Rehab [phone]

## 2024-03-28 ENCOUNTER — Ambulatory Visit
Admission: RE | Admit: 2024-03-28 | Discharge: 2024-03-28 | Disposition: A | Discharge status: Home / Self Care | Source: Ambulatory Visit | Attending: Radiation Oncology | Admitting: Radiation Oncology

## 2024-03-28 ENCOUNTER — Other Ambulatory Visit: Payer: Self-pay

## 2024-03-28 DIAGNOSIS — C50212 Malignant neoplasm of upper-inner quadrant of left female breast: Secondary | ICD-10-CM | POA: Diagnosis not present

## 2024-03-28 LAB — RAD ONC ARIA SESSION SUMMARY
Course Elapsed Days: 24
Plan Fractions Treated to Date: 10
Plan Fractions Treated to Date: 19
Plan Prescribed Dose Per Fraction: 2 Gy
Plan Prescribed Dose Per Fraction: 2 Gy
Plan Total Fractions Prescribed: 13
Plan Total Fractions Prescribed: 25
Plan Total Prescribed Dose: 26 Gy
Plan Total Prescribed Dose: 50 Gy
Reference Point Dosage Given to Date: 20 Gy
Reference Point Dosage Given to Date: 38 Gy
Reference Point Session Dosage Given: 2 Gy
Reference Point Session Dosage Given: 2 Gy
Session Number: 19

## 2024-04-01 ENCOUNTER — Other Ambulatory Visit: Payer: Self-pay

## 2024-04-01 ENCOUNTER — Ambulatory Visit
Admission: RE | Admit: 2024-04-01 | Discharge: 2024-04-01 | Disposition: A | Discharge status: Home / Self Care | Source: Ambulatory Visit | Attending: Radiation Oncology

## 2024-04-01 ENCOUNTER — Ambulatory Visit: Admitting: Radiation Oncology

## 2024-04-01 DIAGNOSIS — C50212 Malignant neoplasm of upper-inner quadrant of left female breast: Secondary | ICD-10-CM | POA: Diagnosis not present

## 2024-04-01 DIAGNOSIS — Z510 Encounter for antineoplastic radiation therapy: Secondary | ICD-10-CM | POA: Diagnosis not present

## 2024-04-01 DIAGNOSIS — Z170 Estrogen receptor positive status [ER+]: Secondary | ICD-10-CM | POA: Diagnosis not present

## 2024-04-01 LAB — RAD ONC ARIA SESSION SUMMARY
Course Elapsed Days: 28
Plan Fractions Treated to Date: 10
Plan Fractions Treated to Date: 20
Plan Prescribed Dose Per Fraction: 2 Gy
Plan Prescribed Dose Per Fraction: 2 Gy
Plan Total Fractions Prescribed: 12
Plan Total Fractions Prescribed: 25
Plan Total Prescribed Dose: 24 Gy
Plan Total Prescribed Dose: 50 Gy
Reference Point Dosage Given to Date: 20 Gy
Reference Point Dosage Given to Date: 40 Gy
Reference Point Session Dosage Given: 2 Gy
Reference Point Session Dosage Given: 2 Gy
Session Number: 20

## 2024-04-02 ENCOUNTER — Ambulatory Visit

## 2024-04-02 ENCOUNTER — Ambulatory Visit
Admission: RE | Admit: 2024-04-02 | Discharge: 2024-04-02 | Disposition: A | Discharge status: Home / Self Care | Source: Ambulatory Visit | Attending: Radiation Oncology | Admitting: Radiation Oncology

## 2024-04-02 ENCOUNTER — Other Ambulatory Visit: Payer: Self-pay

## 2024-04-02 DIAGNOSIS — M6281 Muscle weakness (generalized): Secondary | ICD-10-CM | POA: Diagnosis not present

## 2024-04-02 DIAGNOSIS — M25611 Stiffness of right shoulder, not elsewhere classified: Secondary | ICD-10-CM

## 2024-04-02 DIAGNOSIS — M25612 Stiffness of left shoulder, not elsewhere classified: Secondary | ICD-10-CM

## 2024-04-02 DIAGNOSIS — Z483 Aftercare following surgery for neoplasm: Secondary | ICD-10-CM

## 2024-04-02 DIAGNOSIS — C50212 Malignant neoplasm of upper-inner quadrant of left female breast: Secondary | ICD-10-CM | POA: Diagnosis not present

## 2024-04-02 LAB — RAD ONC ARIA SESSION SUMMARY
Course Elapsed Days: 29
Plan Fractions Treated to Date: 11
Plan Fractions Treated to Date: 21
Plan Prescribed Dose Per Fraction: 2 Gy
Plan Prescribed Dose Per Fraction: 2 Gy
Plan Total Fractions Prescribed: 13
Plan Total Fractions Prescribed: 25
Plan Total Prescribed Dose: 26 Gy
Plan Total Prescribed Dose: 50 Gy
Reference Point Dosage Given to Date: 22 Gy
Reference Point Dosage Given to Date: 42 Gy
Reference Point Session Dosage Given: 2 Gy
Reference Point Session Dosage Given: 2 Gy
Session Number: 21

## 2024-04-02 NOTE — Therapy (Signed)
 [facility] [address] [place], [place], [address] Phone: [phone]   Fax:  [phone]  Patient Details  Name: Ann Daugherty MRN: [id number] Date of Birth: 1971-08-09 Referring Provider:  Lockie Rima, MD  Encounter Date: 04/02/2024  OUTPATIENT PHYSICAL THERAPY ONCOLOGY TREATMENT  Patient Name: Ann Daugherty MRN: [id number] DOB:08-Feb-1971, 53 y.o., female Today's Date: 04/02/2024  END OF SESSION:  PT End of Session - 04/02/24 1212     Visit Number 10    Number of Visits 17    Date for PT Re-Evaluation 04/20/24    PT Start Time 1206    PT Stop Time 1301    PT Time Calculation (min) 55 min    Activity Tolerance Patient tolerated treatment well    Behavior During Therapy WFL for tasks assessed/performed              Past Medical History:  Diagnosis Date   ADD (attention deficit disorder) 2015   Does not take any medication   Anxiety    Cancer (HCC)    breast   Depression    GERD (gastroesophageal reflux disease)    History of chicken pox    PONV (postoperative nausea and vomiting)    Past Surgical History:  Procedure Laterality Date   BACK SURGERY     BREAST BIOPSY Left 07/19/2023   US  LT BREAST BX W LOC DEV EA ADD LESION IMG BX SPEC US  GUIDE 07/19/2023 [facility]   BREAST BIOPSY Left 07/19/2023   US  LT BREAST BX W LOC DEV 1ST LESION IMG BX SPEC US  GUIDE 07/19/2023 [facility]   BREAST BIOPSY Right 08/09/2023   US  RT BREAST BX W LOC DEV 1ST LESION IMG BX SPEC US  GUIDE 08/09/2023 [facility]   BREAST BIOPSY Right 08/09/2023   US  RT BREAST BX W LOC DEV EA ADD LESION IMG BX SPEC US  GUIDE 08/09/2023 [facility]   IR GUIDED LIVER TUMOR ABLATION RFA PERC     IR RADIOLOGIST EVAL & MGMT  01/08/2024   IR RADIOLOGIST EVAL & MGMT  01/31/2024   LAMINECTOMY     2007, 2011   PORTACATH PLACEMENT Right 08/14/2023   Procedure: INSERTION PORT-A-CATH WITH ULTRASOUND GUIDEANCE;  Surgeon: Lockie Rima, MD;  Location: [facility];  Service: General;  Laterality: Right;   SIMPLE MASTECTOMY WITH AXILLARY SENTINEL NODE BIOPSY Bilateral 01/24/2024   Procedure: BILATERAL MASTECTOMY;  Surgeon: Lockie Rima, MD;  Location: [facility];  Service: General;  Laterality: Bilateral;  PEC BLOCK   VAGINAL DELIVERY  2004, 2006   Patient Active Problem List   Diagnosis Date Noted   Primary cancer of upper inner quadrant of left breast (HCC) 01/24/2024   Port-A-Cath in place 08/15/2023   Malignant neoplasm of upper-inner quadrant of left female breast (HCC) 07/20/2023   Depression, major, single episode, mild (HCC) 04/05/2021   Tobacco abuse 01/30/2018   Anxiety 01/30/2018   Attention deficit disorder 01/30/2018   Vitamin D  deficiency 01/30/2018    PCP:   REFERRING PROVIDER: Lockie Rima, MD  REFERRING DIAG: C50.112 (ICD-10-CM) - Malignant neoplasm of central portion of left female breast Z17.0 (ICD-10-CM) - Estrogen receptor positive status (ER+)  THERAPY DIAG:  Aftercare following surgery for neoplasm  Stiffness of left shoulder, not elsewhere classified  Muscle weakness (generalized)  Stiffness of right shoulder, not elsewhere classified  ONSET DATE: 07/2023  Rationale for Evaluation and Treatment: Rehabilitation  SUBJECTIVE:  SUBJECTIVE STATEMENT My Lt armpit feels like it's going to tear. It's super sensitive today. The radiation tech today told me to go get Neosporin with lidocaine  in it.   Eval: Pt wants to have the best possible outcome and wants to get through radiation as good as possible  Pt  wants to improve her posture as she is adjusting to the new body image.  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 The 5 o'clock mass had grade 3 invasive ductal carcinoma that is ER/PR positive, her 2 negative, and Ki 67 60%. The 10:30 mass was grade 2 invasive ductal carcinoma, Er/PR +, her 2 negative and Ki 67 30%. Pt had neoadjuvant chemotherapy during which she had about 30# weight loss.  She metastasis to liver and underwent a liver ablation 01/16/2024. Pt had bilataeral mastectomy on 3/20/ with no lymph nodes removed.  She will start radiation this week.    PAIN:  Are you having pain? No, just tenderness at Lt chest wall at area of radiation  PRECAUTIONS: Other: recent surgery, will be having radiaiton   WEIGHT BEARING RESTRICTIONS: No  FALLS:  Has patient fallen in last 6 months? 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  Mom had had bilateral mastectomies and bilateral knee replacement and colon cancer   HAND DOMINANCE: right   PRIOR LEVEL OF FUNCTION: Independent  PATIENT GOALS: pt want to improve her range of motion and have the best possible outcome from surgery and wants to impove her general strenth    OBJECTIVE: Note: Objective measures were completed at Evaluation unless otherwise noted.  COGNITION: Overall cognitive status: Within functional limits for tasks assessed   PALPATION: Mild fullness at lateral chest below incision on both sides with more on left side   OBSERVATIONS / OTHER ASSESSMENTS: pt with well healing inciscion across front of chest with steristrips still intact in central portion.   Bilateral drain sites with scab intacdt   SENSATION: Pt reports numbness across front of chest and lateral chest   POSTURE: forward head and shoulders . Left ribs rotated forward and prominent in standing. Pt had decreased scapular stabalization on left with scapula moving away from midline with active left shoulder elevation   UPPER EXTREMITY AROM/PROM:  A/PROM RIGHT   eval   Shoulder extension 65  Shoulder flexion 140  Shoulder abduction 135  Shoulder internal rotation   Shoulder external rotation 90    (Blank rows = not tested)  A/PROM LEFT   eval  Shoulder extension 65  Shoulder flexion 155  Shoulder abduction 170  Shoulder internal rotation   Shoulder external rotation 90    (Blank rows = not tested)  CERVICAL AROM: All within normal limits:    UPPER EXTREMITY STRENGTH: shoulders 3-5/5 due to pain from surgery . Otherwise 4+/5 pt reports general weakness from chemo as she feels she has lost muscle mass   LYMPHEDEMA ASSESSMENTS:  SURGERY TYPE/DATE: 01/24/2024 bilateral mastectomy   NUMBER OF LYMPH NODES REMOVED: 0  CHEMOTHERAPY: yes  RADIATION:yes, will start this week   HORMONE TREATMENT: yes  INFECTIONS: no  LYMPHEDEMA ASSESSMENTS:   LANDMARK RIGHT  eval  10 cm proximal to olecranon process 27  Olecranon process 23  10 cm proximal to ulnar styloid process 19.5  Just proximal to ulnar styloid process 15.3  Across hand at thumb web space 19  At base of 2nd digit 5.8  (Blank rows = not tested)  LANDMARK LEFT  eval  10 cm proximal to olecranon process 27  Olecranon process 24  10 cm proximal to ulnar styloid process 19.7  Just proximal to ulnar styloid process 15.7  Across hand at thumb web space 18.7  At base of 2nd digit 5.8  (Blank rows = not tested)    FUNCTIONAL TESTS:  30 seconds chair stand test  13 stit to stand in 30 seconds from low mat height      QUICK DASH SURVEY: 40.91                                                                                                                              TREATMENT DATE:  04/02/24: Therapeutic Exercises Pulleys into flex x 1 min but stopped due to increased discomfort at Lt axilla.  Manual Therapy P/ROM: In supine to Rt shoulder into flex, abd and D2 with scapular depression throughout by therapist MFR to cording that extends from Rt mastectomy incision to axilla and into medial upper arm with prolonged holds when Rt UE in end range stretch; this seemed some worsened today due to increase edema at Rt lateral trunk/chest inferior to mastectomy incision as pt isn't able to wear her compression bra right now consistently due to increased skin changes at Lt chest wall  STM to Rt pectoralis insertion where palpably tight, avoiding area of port on Rt side MLD: Short neck, 5 diaphragmatic breaths, Rt inguinal nodes, Rt axillo-inguinal anastomosis, and focused on small area of edema at Rt lateral trunk throughout P/ROM and STM  03/27/24: Therapeutic Exercise Roll yellow ball up wall into flex and abd x 10 each Therapeutic Activities Supine over full foam roll for following for increased A/ROM and improved posture: Bil UE horz abd x 10, bil UE scaption into a "V" x 10, and then bil UE abd in a "snow angel" x 10, 5 sec holds returning therapist demo for review for each Prone laying diagonal on mat table: Rows with 2# 2 x 12, then 1# for I, Y, T's 2 x 12 with VC's for prolonged rest between sets. Pt reports feeling good joint distraction stretch between sets while holding weights. Manual Therapy P/ROM: In supine to Lt shoulder into flex, abd and D2 with scapular depression throughout, then same to Rt side and spent more time on Rt side MFR to cording that extends from Rt mastectomy incision to axilla and into medial upper arm  with prolonged holds when Rt UE in end range stretch; some improvement continued to be noted with this today STM to each pectoralis insertion where palpably tight,  avoiding area of port on Rt side and area of radiation on Lt side MLD: Short neck, 5 diaphragmatic breaths, Rt inguinal nodes, Rt axillo-inguinal anastomosis, and focused on small area of edema at Rt lateral trunk throughout P/ROM and STM Kinesiotape along Rt axillo-inguinal anastomosis with bucket inferior to transverse watershed and 3 fingers running up to edema at lateral trunk (2 fingers inferior and 1 superior to mastectomy incision)   03/25/24: Therapeutic Exercise Roll yellow ball up wall into flex and abd x 10 each Modified downward dog on wall x5 reps, 5 sec holds Doorway pectoralis stretch x 5 reps, 20 sec each at varying heights Therapeutic Activities Free Motion Machine 7#  2 x 10 with core engaged and in mini squat position standing on airex returning therapist demo, then bil shoulder ext 2 x 10 reps, 7# with good technique Supine over full foam roll for following for increased A/ROM and improved posture: Bil UE horz abd x 10, bil UE scaption into a "V" x 10, and then bil UE abd in a "snow angel" x 10, 5 sec holds returning therapist demo for review for each Manual Therapy P/ROM: In supine to Lt shoulder into flex, abd and D2 with scapular depression throughout, then same to Rt side and spent more time on Rt side MFR to cording that extends from Rt mastectomy incision to axilla and into medial upper arm with prolonged holds when Rt UE in end range stretch; some improvement continued to be noted with this today STM to each pectoralis insertion where palpably tight, avoiding area of port on Rt side and area of radiation on Lt side MLD: Short neck, 5 diaphragmatic breaths, Rt inguinal nodes, Rt axillo-inguinal anastomosis, and focused on small area of edema at Rt lateral trunk throughout P/ROM and STM; this area of edema much smaller today. Pt was able to tolerate wearing her compression bra yesterday      PATIENT EDUCATION:  Education details: Prone "I, Y, and T's"  Person  educated: Patient Education method: Explanation, Demonstration, Tactile cues, Verbal cues, and Handouts Education comprehension: verbalized understanding and returned demonstration and VC's during, will benefit from further review  HOME EXERCISE PROGRAM: Supine dowel exercise Supine scapular series  Standing bil UE 3 way raises Prone "I, Y, and T's"  ASSESSMENT:  CLINICAL IMPRESSION: Today pt comes in reporting increased skin changes to Lt chest wall due to radiation. She wanted to try the pulleys for ROM but increased skin stretch that felt like tearing so stopped. Instead focused on Rt upper quadrant tightness. Her Rt lateral trunk edema is worsened today which made her cording some worse as she is unable to wear her compression bra right now.     OBJECTIVE IMPAIRMENTS: decreased endurance, decreased ROM, decreased strength, increased edema, increased fascial restrictions, impaired perceived functional ability, impaired UE functional use, postural dysfunction, and pain.   ACTIVITY LIMITATIONS: carrying, lifting, reach over head, hygiene/grooming, and endurance   PARTICIPATION LIMITATIONS: will be having radiaiton   PERSONAL FACTORS: 3+ comorbidities: past chemo, liver mets and monitoring, will have radiation are also affecting patient's functional outcome.   REHAB POTENTIAL: Good  CLINICAL DECISION MAKING: Evolving/moderate complexity  EVALUATION COMPLEXITY: Moderate  GOALS: Goals reviewed with patient? Yes  SHORT TERM GOALS: Target date: 03/20/2024  Right and left shoulder flexion will be 160  Baseline: right  is 140, left is 155 Goal status: INITIAL  2.  Pt will be independent in a basic home exercise program for shoulder ROM  Baseline: no knowledge  Goal status: INITIAL   LONG TERM GOALS: Target date: 04/21/2024  Pt will have painfree right shoulder abduction of 170 degrees  Baseline: 135 with pain  Goal status: INITIAL  2.  Pt will be independent in managing trunk  fullness with compression and manual lymph drainage  Baseline: no knowledge Goal status: INITIAL  3.  Pt will be independent in a stregnthening exercises program that she can do at home  Baseline: no knowledge  Goal status: INITIAL  4.  Pt will decrease Quick Dash score to < 10  Baseline: 40.91 Goal status: INITIAL  5.  Pt will increase 30 second sit to stand to 16 Baseline: 13 Goal status: INITIAL  PLAN:  PT FREQUENCY: 2x/week  PT DURATION: 8 weeks  PLANNED INTERVENTIONS: 97164- PT Re-evaluation, 97750- Physical Performance Testing, 97110-Therapeutic exercises, 97530- Therapeutic activity, 97112- Neuromuscular re-education, 97535- Self Care, 16109- Manual therapy, Patient/Family education, Manual lymph drainage, Scar mobilization, and Compression bandaging  PLAN FOR NEXT SESSION:  Cont following as able due to skin changes from radiation: Review prone "I, Y, T's" prn; cont manual therapy focus on right side and cording in right axilla and pocket of lateral trunk edema; P/AA/AROM to both shoulders. Later instruct in Strength ABC Program, possibly near the end of radiation.    Denyce Flank, PTA 04/02/2024, 1:10 PM   [facility] [address] [place], [place], [address] Phone: [phone]   Fax:  [phone]   CHEST: Doorway, Bilateral - Standing    Standing in doorway with one foot in front of the other, place hands on wall with elbows bent at shoulder height. Lean forward. Hold _20-30__ seconds. _2-3__ reps per set, _2__ sets per day.  Strengthening: Horizontal Abduction - with External Rotation (Prone)    Holding __1-2__ pound weights, raise arms out from sides, pinching shoulder blades. Keep elbows straight, thumbs up. This is "T" position. Repeat __10__ times per set. Do _1-2___ sets per session. Do __1__ sessions every other day.  Then do same with arms in "Y" position, and last "I" position.     Cancer  Rehab [phone]

## 2024-04-03 ENCOUNTER — Ambulatory Visit
Admission: RE | Admit: 2024-04-03 | Discharge: 2024-04-03 | Disposition: A | Discharge status: Home / Self Care | Source: Ambulatory Visit | Attending: Radiation Oncology | Admitting: Radiation Oncology

## 2024-04-03 ENCOUNTER — Other Ambulatory Visit: Payer: Self-pay

## 2024-04-03 DIAGNOSIS — C50212 Malignant neoplasm of upper-inner quadrant of left female breast: Secondary | ICD-10-CM | POA: Diagnosis not present

## 2024-04-03 LAB — RAD ONC ARIA SESSION SUMMARY
Course Elapsed Days: 30
Plan Fractions Treated to Date: 11
Plan Fractions Treated to Date: 22
Plan Prescribed Dose Per Fraction: 2 Gy
Plan Prescribed Dose Per Fraction: 2 Gy
Plan Total Fractions Prescribed: 12
Plan Total Fractions Prescribed: 25
Plan Total Prescribed Dose: 24 Gy
Plan Total Prescribed Dose: 50 Gy
Reference Point Dosage Given to Date: 22 Gy
Reference Point Dosage Given to Date: 44 Gy
Reference Point Session Dosage Given: 2 Gy
Reference Point Session Dosage Given: 2 Gy
Session Number: 22

## 2024-04-04 ENCOUNTER — Other Ambulatory Visit: Payer: Self-pay

## 2024-04-04 ENCOUNTER — Ambulatory Visit

## 2024-04-04 ENCOUNTER — Ambulatory Visit
Admission: RE | Admit: 2024-04-04 | Discharge: 2024-04-04 | Disposition: A | Discharge status: Home / Self Care | Source: Ambulatory Visit | Attending: Radiation Oncology | Admitting: Radiation Oncology

## 2024-04-04 DIAGNOSIS — M25611 Stiffness of right shoulder, not elsewhere classified: Secondary | ICD-10-CM

## 2024-04-04 DIAGNOSIS — Z483 Aftercare following surgery for neoplasm: Secondary | ICD-10-CM

## 2024-04-04 DIAGNOSIS — M6281 Muscle weakness (generalized): Secondary | ICD-10-CM

## 2024-04-04 DIAGNOSIS — C50212 Malignant neoplasm of upper-inner quadrant of left female breast: Secondary | ICD-10-CM | POA: Diagnosis not present

## 2024-04-04 DIAGNOSIS — M25612 Stiffness of left shoulder, not elsewhere classified: Secondary | ICD-10-CM

## 2024-04-04 LAB — RAD ONC ARIA SESSION SUMMARY
Course Elapsed Days: 31
Plan Fractions Treated to Date: 12
Plan Fractions Treated to Date: 23
Plan Prescribed Dose Per Fraction: 2 Gy
Plan Prescribed Dose Per Fraction: 2 Gy
Plan Total Fractions Prescribed: 13
Plan Total Fractions Prescribed: 25
Plan Total Prescribed Dose: 26 Gy
Plan Total Prescribed Dose: 50 Gy
Reference Point Dosage Given to Date: 24 Gy
Reference Point Dosage Given to Date: 46 Gy
Reference Point Session Dosage Given: 2 Gy
Reference Point Session Dosage Given: 2 Gy
Session Number: 23

## 2024-04-04 NOTE — Therapy (Signed)
 [facility] [address] [place], [place], [address] Phone: [phone]   Fax:  [phone]  Patient Details  Name: Ann Daugherty MRN: [id number] Date of Birth: 02-Aug-1971 Referring Provider:  Lockie Rima, MD  Encounter Date: 04/04/2024  OUTPATIENT PHYSICAL THERAPY ONCOLOGY TREATMENT  Patient Name: Ann Daugherty MRN: [id number] DOB:[birth year], 53 y.o., female Today's Date: 04/04/2024  END OF SESSION:  PT End of Session - 04/04/24 0908     Visit Number 11    Number of Visits 17    Date for PT Re-Evaluation 04/20/24    Authorization Type [employer] authorized 12 visits 02/19/24-04/19/24    Authorization - Visit Number 11    Authorization - Number of Visits 12    PT Start Time 0904    PT Stop Time 0957    PT Time Calculation (min) 53 min    Activity Tolerance Patient tolerated treatment well    Behavior During Therapy WFL for tasks assessed/performed              Past Medical History:  Diagnosis Date   ADD (attention deficit disorder) 2015   Does not take any medication   Anxiety    Cancer (HCC)    breast   Depression    GERD (gastroesophageal reflux disease)    History of chicken pox    PONV (postoperative nausea and vomiting)    Past Surgical History:  Procedure Laterality Date   BACK SURGERY     BREAST BIOPSY Left 07/19/2023   US  LT BREAST BX W LOC DEV EA ADD LESION IMG BX SPEC US  GUIDE 07/19/2023 [facility]   BREAST BIOPSY Left 07/19/2023   US  LT BREAST BX W LOC DEV 1ST LESION IMG BX SPEC US  GUIDE 07/19/2023 [facility]   BREAST BIOPSY Right 08/09/2023   US  RT BREAST BX W LOC DEV 1ST LESION IMG BX SPEC US  GUIDE 08/09/2023 [facility]   BREAST BIOPSY Right 08/09/2023   US  RT BREAST BX W LOC DEV EA ADD LESION IMG BX SPEC US  GUIDE 08/09/2023 [facility]   IR GUIDED LIVER TUMOR ABLATION RFA PERC     IR RADIOLOGIST EVAL & MGMT  01/08/2024   IR RADIOLOGIST EVAL & MGMT  01/31/2024   LAMINECTOMY      2007, 2011   PORTACATH PLACEMENT Right 08/14/2023   Procedure: INSERTION PORT-A-CATH WITH ULTRASOUND GUIDEANCE;  Surgeon: Lockie Rima, MD;  Location: [facility];  Service: General;  Laterality: Right;   SIMPLE MASTECTOMY WITH AXILLARY SENTINEL NODE BIOPSY Bilateral 01/24/2024   Procedure: BILATERAL MASTECTOMY;  Surgeon: Lockie Rima, MD;  Location: [facility];  Service: General;  Laterality: Bilateral;  PEC BLOCK   VAGINAL DELIVERY  2004, 2006   Patient Active Problem List   Diagnosis Date Noted   Primary cancer of upper inner quadrant of left breast (HCC) 01/24/2024   Port-A-Cath in place 08/15/2023   Malignant neoplasm of upper-inner quadrant of left female breast (HCC) 07/20/2023   Depression, major, single episode, mild (HCC) 04/05/2021   Tobacco abuse 01/30/2018   Anxiety 01/30/2018   Attention deficit disorder 01/30/2018   Vitamin D  deficiency 01/30/2018    PCP:   REFERRING PROVIDER: Lockie Rima, MD  REFERRING DIAG: C50.112 (ICD-10-CM) - Malignant neoplasm of central portion of left female breast Z17.0 (ICD-10-CM) - Estrogen receptor positive status (ER+)  THERAPY DIAG:  Aftercare following surgery for neoplasm  Stiffness of left shoulder, not elsewhere classified  Muscle weakness (generalized)  Stiffness of right shoulder,  not elsewhere classified  ONSET DATE: 07/2023  Rationale for Evaluation and Treatment: Rehabilitation  SUBJECTIVE:                                                                                                                                                                                           SUBJECTIVE STATEMENT I've had to stop wearing a bra. My skin is so tender now. And can I have kinesiotape again on my Rt lateral trunk? It really helps the edema, especially now that I can't wear my any bra.   Eval: Pt wants to have the best possible outcome and wants to get through radiation as good as possible  Pt  wants to improve  her posture as she is adjusting to the new body image.  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 The 5 o'clock mass had grade 3 invasive ductal carcinoma that is ER/PR positive, her 2 negative, and Ki 67 60%. The 10:30 mass was grade 2 invasive ductal carcinoma, Er/PR +, her 2 negative and Ki 67 30%. Pt had neoadjuvant chemotherapy during which she had about 30# weight loss.  She metastasis to liver and underwent a liver ablation 01/16/2024. Pt had bilataeral mastectomy on 3/20/ with no lymph nodes removed.  She will start radiation this week.    PAIN:  Are you having pain? No, just tenderness at Lt chest wall at area of radiation  PRECAUTIONS: Other: recent surgery, will be having radiaiton   WEIGHT BEARING RESTRICTIONS: No  FALLS:  Has patient fallen in last 6 months? 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  Mom had had bilateral mastectomies and bilateral knee replacement and colon cancer   HAND DOMINANCE: right   PRIOR LEVEL OF FUNCTION: Independent  PATIENT GOALS: pt want to improve her range of motion and have the best possible outcome from surgery and wants to impove her general strenth    OBJECTIVE: Note: Objective measures were completed at Evaluation unless otherwise noted.  COGNITION: Overall cognitive status: Within functional limits for tasks assessed  PALPATION: Mild fullness at lateral chest below incision on  both sides with more on left side   OBSERVATIONS / OTHER ASSESSMENTS: pt with well healing inciscion across front of chest with steristrips still intact in central portion.  Bilateral drain sites with scab intacdt   SENSATION: Pt reports numbness across front of chest and lateral chest   POSTURE: forward head and shoulders . Left ribs rotated forward and prominent in standing. Pt had decreased scapular stabalization on left with scapula moving away from midline with active left shoulder elevation   UPPER EXTREMITY AROM/PROM:  A/PROM RIGHT   eval   Shoulder extension 65  Shoulder flexion 140  Shoulder abduction 135  Shoulder internal rotation   Shoulder external rotation 90    (Blank rows = not tested)  A/PROM LEFT   eval  Shoulder extension 65  Shoulder flexion 155  Shoulder abduction 170  Shoulder internal rotation   Shoulder external rotation 90    (Blank rows = not tested)  CERVICAL AROM: All within normal limits:    UPPER EXTREMITY STRENGTH: shoulders 3-5/5 due to pain from surgery . Otherwise 4+/5 pt reports general weakness from chemo as she feels she has lost muscle mass   LYMPHEDEMA ASSESSMENTS:   SURGERY TYPE/DATE: 01/24/2024 bilateral mastectomy   NUMBER OF LYMPH NODES REMOVED: 0  CHEMOTHERAPY: yes  RADIATION:yes, will start this week   HORMONE TREATMENT: yes  INFECTIONS: no  LYMPHEDEMA ASSESSMENTS:   LANDMARK RIGHT  eval  10 cm proximal to olecranon process 27  Olecranon process 23  10 cm proximal to ulnar styloid process 19.5  Just proximal to ulnar styloid process 15.3  Across hand at thumb web space 19  At base of 2nd digit 5.8  (Blank rows = not tested)  LANDMARK LEFT  eval  10 cm proximal to olecranon process 27  Olecranon process 24  10 cm proximal to ulnar styloid process 19.7  Just proximal to ulnar styloid process 15.7  Across hand at thumb web space 18.7  At base of 2nd digit 5.8  (Blank rows = not tested)    FUNCTIONAL  TESTS:  30 seconds chair stand test  13 stit to stand in 30 seconds from low mat height      QUICK DASH SURVEY: 40.91                                                                                                                             TREATMENT DATE:  04/04/24: Manual Therapy P/ROM: In supine to Rt shoulder into flex, abd and D2 with scapular depression throughout by therapist MFR to cording that extends from Rt mastectomy incision to axilla and into medial upper arm with prolonged holds when Rt UE in end range stretch; this seemed some worsened today due to increase edema at Rt lateral trunk/chest inferior to mastectomy incision as pt isn't able to wear her compression bra right now consistently due to increased skin changes at [facility]  chest wall  STM to Rt pectoralis insertion where palpably tight, avoiding area of port on Rt side MLD: Short neck, 5 diaphragmatic breaths, Rt inguinal nodes, Rt axillo-inguinal anastomosis, and focused on small area of edema at Rt lateral trunk throughout P/ROM and STM Kinesiotape along Rt axillo-inguinal anastomosis with bucket inferior to transverse watershed and 3 fingers running up to edema at lateral trunk (2 fingers inferior and 1 superior to mastectomy incision)  04/02/24: Therapeutic Exercises Pulleys into flex x 1 min but stopped due to increased discomfort at Lt axilla.  Manual Therapy P/ROM: In supine to Rt shoulder into flex, abd and D2 with scapular depression throughout by therapist MFR to cording that extends from Rt mastectomy incision to axilla and into medial upper arm with prolonged holds when Rt UE in end range stretch; this seemed some worsened today due to increase edema at Rt lateral trunk/chest inferior to mastectomy incision as pt isn't able to wear her compression bra right now consistently due to increased skin changes at Lt chest wall  STM to Rt pectoralis insertion where palpably tight, avoiding area of port on Rt side MLD:  Short neck, 5 diaphragmatic breaths, Rt inguinal nodes, Rt axillo-inguinal anastomosis, and focused on small area of edema at Rt lateral trunk throughout P/ROM and STM  03/27/24: Therapeutic Exercise Roll yellow ball up wall into flex and abd x 10 each Therapeutic Activities Supine over full foam roll for following for increased A/ROM and improved posture: Bil UE horz abd x 10, bil UE scaption into a "V" x 10, and then bil UE abd in a "snow angel" x 10, 5 sec holds returning therapist demo for review for each Prone laying diagonal on mat table: Rows with 2# 2 x 12, then 1# for I, Y, T's 2 x 12 with VC's for prolonged rest between sets. Pt reports feeling good joint distraction stretch between sets while holding weights. Manual Therapy P/ROM: In supine to Lt shoulder into flex, abd and D2 with scapular depression throughout, then same to Rt side and spent more time on Rt side MFR to cording that extends from Rt mastectomy incision to axilla and into medial upper arm with prolonged holds when Rt UE in end range stretch; some improvement continued to be noted with this today STM to each pectoralis insertion where palpably tight, avoiding area of port on Rt side and area of radiation on Lt side MLD: Short neck, 5 diaphragmatic breaths, Rt inguinal nodes, Rt axillo-inguinal anastomosis, and focused on small area of edema at Rt lateral trunk throughout P/ROM and STM Kinesiotape along Rt axillo-inguinal anastomosis with bucket inferior to transverse watershed and 3 fingers running up to edema at lateral trunk (2 fingers inferior and 1 superior to mastectomy incision      PATIENT EDUCATION:  Education details: Prone "I, Y, and T's"  Person educated: Patient Education method: Explanation, Demonstration, Tactile cues, Verbal cues, and Handouts Education comprehension: verbalized understanding and returned demonstration and VC's during, will benefit from further review  HOME EXERCISE PROGRAM: Supine  dowel exercise Supine scapular series  Standing bil UE 3 way raises Prone "I, Y, and T's"  ASSESSMENT:  CLINICAL IMPRESSION: Continued with focus on manual therapy as pts Lt chest wall is very reddened and tender as she is towards the end of her radiation. So Rt upper quadrant manual therapy performed and applied kinesiotape at end of session as pt is unable to tolerate wearing her compression bra due to Lt side currently. And as her  Rt lateral trunk edema worsens her Rt shoulder A/ROM becomes more limited. Pt reports tightness in Rt upper quadrant feeling improved by end of session.     OBJECTIVE IMPAIRMENTS: decreased endurance, decreased ROM, decreased strength, increased edema, increased fascial restrictions, impaired perceived functional ability, impaired UE functional use, postural dysfunction, and pain.   ACTIVITY LIMITATIONS: carrying, lifting, reach over head, hygiene/grooming, and endurance   PARTICIPATION LIMITATIONS: will be having radiaiton   PERSONAL FACTORS: 3+ comorbidities: past chemo, liver mets and monitoring, will have radiation are also affecting patient's functional outcome.   REHAB POTENTIAL: Good  CLINICAL DECISION MAKING: Evolving/moderate complexity  EVALUATION COMPLEXITY: Moderate  GOALS: Goals reviewed with patient? Yes  SHORT TERM GOALS: Target date: 03/20/2024  Right and left shoulder flexion will be 160  Baseline: right is 140, left is 155 Goal status: INITIAL  2.  Pt will be independent in a basic home exercise program for shoulder ROM  Baseline: no knowledge  Goal status: INITIAL   LONG TERM GOALS: Target date: 04/21/2024  Pt will have painfree right shoulder abduction of 170 degrees  Baseline: 135 with pain  Goal status: INITIAL  2.  Pt will be independent in managing trunk fullness with compression and manual lymph drainage  Baseline: no knowledge Goal status: INITIAL  3.  Pt will be independent in a stregnthening exercises program  that she can do at home  Baseline: no knowledge  Goal status: INITIAL  4.  Pt will decrease Quick Dash score to < 10  Baseline: 40.91 Goal status: INITIAL  5.  Pt will increase 30 second sit to stand to 16 Baseline: 13 Goal status: INITIAL  PLAN:  PT FREQUENCY: 2x/week  PT DURATION: 8 weeks  PLANNED INTERVENTIONS: 97164- PT Re-evaluation, 97750- Physical Performance Testing, 97110-Therapeutic exercises, 97530- Therapeutic activity, 97112- Neuromuscular re-education, 97535- Self Care, 40981- Manual therapy, Patient/Family education, Manual lymph drainage, Scar mobilization, and Compression bandaging  PLAN FOR NEXT SESSION:  Ins auth renewal request next. Cont following as able due to skin changes from radiation: Resume prone "I, Y, T's" once pts skin heals and she can tolerate prone laying again; cont manual therapy focus on right side and cording in right axilla and pocket of lateral trunk edema and kinesiotape here prn; P/AA/AROM to both shoulders. Later instruct in Strength ABC Program, after she has healed from radiation.    Denyce Flank, PTA 04/04/2024, 10:02 AM   [facility] [address] [place], [place], [address] Phone: [phone]   Fax:  [phone]   CHEST: Doorway, Bilateral - Standing    Standing in doorway with one foot in front of the other, place hands on wall with elbows bent at shoulder height. Lean forward. Hold _20-30__ seconds. _2-3__ reps per set, _2__ sets per day.  Strengthening: Horizontal Abduction - with External Rotation (Prone)    Holding __1-2__ pound weights, raise arms out from sides, pinching shoulder blades. Keep elbows straight, thumbs up. This is "T" position. Repeat __10__ times per set. Do _1-2___ sets per session. Do __1__ sessions every other day.  Then do same with arms in "Y" position, and last "I" position.     [facility] [phone]

## 2024-04-07 ENCOUNTER — Ambulatory Visit

## 2024-04-07 ENCOUNTER — Ambulatory Visit: Admitting: Radiation Oncology

## 2024-04-08 ENCOUNTER — Ambulatory Visit

## 2024-04-08 ENCOUNTER — Other Ambulatory Visit: Payer: Self-pay | Admitting: Interventional Radiology

## 2024-04-08 ENCOUNTER — Ambulatory Visit
Admission: RE | Admit: 2024-04-08 | Discharge: 2024-04-08 | Disposition: A | Discharge status: Home / Self Care | Source: Ambulatory Visit | Attending: Radiation Oncology | Admitting: Radiation Oncology

## 2024-04-08 ENCOUNTER — Other Ambulatory Visit: Payer: Self-pay | Admitting: Radiation Oncology

## 2024-04-08 ENCOUNTER — Other Ambulatory Visit (HOSPITAL_COMMUNITY): Payer: Self-pay

## 2024-04-08 ENCOUNTER — Other Ambulatory Visit: Payer: Self-pay

## 2024-04-08 DIAGNOSIS — C50212 Malignant neoplasm of upper-inner quadrant of left female breast: Secondary | ICD-10-CM | POA: Insufficient documentation

## 2024-04-08 DIAGNOSIS — C189 Malignant neoplasm of colon, unspecified: Secondary | ICD-10-CM

## 2024-04-08 DIAGNOSIS — C787 Secondary malignant neoplasm of liver and intrahepatic bile duct: Secondary | ICD-10-CM

## 2024-04-08 LAB — RAD ONC ARIA SESSION SUMMARY
Course Elapsed Days: 35
Plan Fractions Treated to Date: 12
Plan Fractions Treated to Date: 24
Plan Prescribed Dose Per Fraction: 2 Gy
Plan Prescribed Dose Per Fraction: 2 Gy
Plan Total Fractions Prescribed: 12
Plan Total Fractions Prescribed: 25
Plan Total Prescribed Dose: 24 Gy
Plan Total Prescribed Dose: 50 Gy
Reference Point Dosage Given to Date: 24 Gy
Reference Point Dosage Given to Date: 48 Gy
Reference Point Session Dosage Given: 2 Gy
Reference Point Session Dosage Given: 2 Gy
Session Number: 24

## 2024-04-08 MED ORDER — OXYCODONE HCL 5 MG PO TABS
5.0000 mg | ORAL_TABLET | Freq: Four times a day (QID) | ORAL | 0 refills | Status: DC | PRN
  Filled 2024-04-08: qty 40, 5d supply, fill #0

## 2024-04-09 ENCOUNTER — Ambulatory Visit
Admission: RE | Admit: 2024-04-09 | Discharge: 2024-04-09 | Disposition: A | Discharge status: Home / Self Care | Source: Ambulatory Visit | Attending: Radiation Oncology | Admitting: Radiation Oncology

## 2024-04-09 ENCOUNTER — Other Ambulatory Visit (HOSPITAL_COMMUNITY): Payer: Self-pay

## 2024-04-09 ENCOUNTER — Encounter: Payer: Self-pay | Admitting: Hematology and Oncology

## 2024-04-09 ENCOUNTER — Other Ambulatory Visit: Payer: Self-pay

## 2024-04-09 DIAGNOSIS — C50212 Malignant neoplasm of upper-inner quadrant of left female breast: Secondary | ICD-10-CM | POA: Diagnosis not present

## 2024-04-09 DIAGNOSIS — Z510 Encounter for antineoplastic radiation therapy: Secondary | ICD-10-CM | POA: Diagnosis not present

## 2024-04-09 DIAGNOSIS — Z170 Estrogen receptor positive status [ER+]: Secondary | ICD-10-CM | POA: Diagnosis not present

## 2024-04-09 LAB — RAD ONC ARIA SESSION SUMMARY
Course Elapsed Days: 36
Plan Fractions Treated to Date: 13
Plan Fractions Treated to Date: 25
Plan Prescribed Dose Per Fraction: 2 Gy
Plan Prescribed Dose Per Fraction: 2 Gy
Plan Total Fractions Prescribed: 13
Plan Total Fractions Prescribed: 25
Plan Total Prescribed Dose: 26 Gy
Plan Total Prescribed Dose: 50 Gy
Reference Point Dosage Given to Date: 26 Gy
Reference Point Dosage Given to Date: 50 Gy
Reference Point Session Dosage Given: 2 Gy
Reference Point Session Dosage Given: 2 Gy
Session Number: 25

## 2024-04-10 ENCOUNTER — Other Ambulatory Visit: Payer: Self-pay

## 2024-04-10 ENCOUNTER — Ambulatory Visit
Admission: RE | Admit: 2024-04-10 | Discharge: 2024-04-10 | Disposition: A | Discharge status: Home / Self Care | Source: Ambulatory Visit | Attending: Radiation Oncology | Admitting: Radiation Oncology

## 2024-04-10 ENCOUNTER — Ambulatory Visit: Attending: General Surgery

## 2024-04-10 DIAGNOSIS — M25611 Stiffness of right shoulder, not elsewhere classified: Secondary | ICD-10-CM | POA: Diagnosis not present

## 2024-04-10 DIAGNOSIS — Z483 Aftercare following surgery for neoplasm: Secondary | ICD-10-CM | POA: Insufficient documentation

## 2024-04-10 DIAGNOSIS — C50212 Malignant neoplasm of upper-inner quadrant of left female breast: Secondary | ICD-10-CM | POA: Diagnosis not present

## 2024-04-10 DIAGNOSIS — M25612 Stiffness of left shoulder, not elsewhere classified: Secondary | ICD-10-CM | POA: Diagnosis not present

## 2024-04-10 DIAGNOSIS — M6281 Muscle weakness (generalized): Secondary | ICD-10-CM | POA: Diagnosis not present

## 2024-04-10 LAB — RAD ONC ARIA SESSION SUMMARY
Course Elapsed Days: 37
Plan Fractions Treated to Date: 1
Plan Prescribed Dose Per Fraction: 2 Gy
Plan Total Fractions Prescribed: 5
Plan Total Prescribed Dose: 10 Gy
Reference Point Dosage Given to Date: 2 Gy
Reference Point Session Dosage Given: 2 Gy
Session Number: 26

## 2024-04-10 NOTE — Therapy (Addendum)
 [facility] [address] [place], [place], [address] Phone: [phone]   Fax:  [phone]  Patient Details  Name: Ann Daugherty MRN: [id number] Date of Birth: 28-Nov-1970 Referring Provider:  Aron Shoulders, MD  Encounter Date: 04/10/2024  OUTPATIENT PHYSICAL THERAPY ONCOLOGY TREATMENT  Patient Name: Ann Daugherty MRN: [id number] DOB:[birth year], 53 y.o., female Today's Date: 04/10/2024  END OF SESSION:  PT End of Session - 04/10/24 1309     Visit Number 12    Number of Visits 20    Date for PT Re-Evaluation 05/08/24    Authorization Type [employer] authorized 12 visits 02/19/24-04/19/24 new auth sent 04/10/24    Authorization - Visit Number 12    Authorization - Number of Visits 12    PT Start Time 1202    PT Stop Time 1259    PT Time Calculation (min) 57 min    Activity Tolerance Patient tolerated treatment well    Behavior During Therapy WFL for tasks assessed/performed              Past Medical History:  Diagnosis Date   ADD (attention deficit disorder) 2015   Does not take any medication   Anxiety    Cancer (HCC)    breast   Depression    GERD (gastroesophageal reflux disease)    History of chicken pox    PONV (postoperative nausea and vomiting)    Past Surgical History:  Procedure Laterality Date   BACK SURGERY     BREAST BIOPSY Left 07/19/2023   US  LT BREAST BX W LOC DEV EA ADD LESION IMG BX SPEC US  GUIDE 07/19/2023 [facility]   BREAST BIOPSY Left 07/19/2023   US  LT BREAST BX W LOC DEV 1ST LESION IMG BX SPEC US  GUIDE 07/19/2023 [facility]   BREAST BIOPSY Right 08/09/2023   US  RT BREAST BX W LOC DEV 1ST LESION IMG BX SPEC US  GUIDE 08/09/2023 [facility]   BREAST BIOPSY Right 08/09/2023   US  RT BREAST BX W LOC DEV EA ADD LESION IMG BX SPEC US  GUIDE 08/09/2023 [facility]   IR GUIDED LIVER TUMOR ABLATION RFA PERC     IR RADIOLOGIST EVAL & MGMT  01/08/2024   IR RADIOLOGIST EVAL & MGMT   01/31/2024   LAMINECTOMY     2007, 2011   PORTACATH PLACEMENT Right 08/14/2023   Procedure: INSERTION PORT-A-CATH WITH ULTRASOUND GUIDEANCE;  Surgeon: Aron Shoulders, MD;  Location: [facility];  Service: General;  Laterality: Right;   SIMPLE MASTECTOMY WITH AXILLARY SENTINEL NODE BIOPSY Bilateral 01/24/2024   Procedure: BILATERAL MASTECTOMY;  Surgeon: Aron Shoulders, MD;  Location: [facility];  Service: General;  Laterality: Bilateral;  PEC BLOCK   VAGINAL DELIVERY  2004, 2006   Patient Active Problem List   Diagnosis Date Noted   Primary cancer of upper inner quadrant of left breast (HCC) 01/24/2024   Port-A-Cath in place 08/15/2023   Malignant neoplasm of upper-inner quadrant of left female breast (HCC) 07/20/2023   Depression, major, single episode, mild (HCC) 04/05/2021   Tobacco abuse 01/30/2018   Anxiety 01/30/2018   Attention deficit disorder 01/30/2018   Vitamin D  deficiency 01/30/2018    PCP:   REFERRING PROVIDER: Aron Shoulders, MD  REFERRING DIAG: C50.112 (ICD-10-CM) - Malignant neoplasm of central portion of left female breast Z17.0 (ICD-10-CM) - Estrogen receptor positive status (ER+)  THERAPY DIAG:  Aftercare following surgery for neoplasm  Stiffness of left shoulder, not elsewhere classified  Muscle weakness (generalized)  Stiffness of right shoulder, not elsewhere classified  ONSET DATE: 07/2023  Rationale for Evaluation and Treatment: Rehabilitation  SUBJECTIVE:                                                                                                                                                                                           SUBJECTIVE STATEMENT I'm sorry I had to cancel my last visit. I was hurting so bad from radiation I started crying on the table. They finally prescribed me some pain medication so I could sleep because I wasn't even able to sleep. My skin is hurting so bad and is so tender. I can't wear my compression and I've  been stretching less because everything pulls my skin right now.   Eval: Pt wants to have the best possible outcome and wants to get through radiation as good as possible  Pt  wants to improve her posture as she is adjusting to the new body image.  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 The 5 o'clock mass had grade 3 invasive ductal carcinoma that is ER/PR positive, her 2 negative, and Ki 67 60%. The 10:30 mass was grade 2 invasive ductal carcinoma, Er/PR +, her 2 negative and Ki 67 30%. Pt had neoadjuvant chemotherapy during which she had about 30# weight loss.  She metastasis to liver and underwent a liver ablation 01/16/2024. Pt had bilataeral mastectomy on 3/20/ with no lymph nodes removed.  She will start radiation this week.    PAIN:  Are you having pain? Not currently except extremely tender to touch/sensitive, it doesn't hurt as bad now because I took a pain pill. But it was waking me up at night and keeping me from sleeping across my Lt chest.   PRECAUTIONS: Other: recent surgery, will be having radiaiton   WEIGHT BEARING RESTRICTIONS: No  FALLS:  Has patient fallen in last 6 months? 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  Mom had had bilateral mastectomies and bilateral knee replacement and colon cancer   HAND DOMINANCE: right  PRIOR LEVEL OF FUNCTION: Independent  PATIENT GOALS: pt want to improve her range of motion and  have the best possible outcome from surgery and wants to impove her general strenth    OBJECTIVE: Note: Objective measures were completed at Evaluation unless otherwise noted.  COGNITION: Overall cognitive status: Within functional limits for tasks assessed   PALPATION: Mild fullness at lateral chest below incision on both sides with more on left side   OBSERVATIONS / OTHER ASSESSMENTS: pt with well healing inciscion across front of chest with steristrips still intact in central portion.  Bilateral drain sites with scab intacdt   SENSATION: Pt reports numbness across front of chest and lateral chest   POSTURE: forward head and shoulders . Left ribs rotated forward and prominent in standing. Pt had decreased scapular stabalization on left with scapula moving away from midline with active left shoulder elevation   UPPER EXTREMITY AROM/PROM:  A/PROM RIGHT   eval  Right 04/10/24  Shoulder extension 65 65  Shoulder flexion 140 160  Shoulder abduction 135 155  Shoulder internal rotation    Shoulder external rotation 90     (Blank rows = not tested)  A/PROM LEFT   eval Left 04/10/24  Shoulder extension 65 50  Shoulder flexion 155 142  Shoulder abduction 170 147  Shoulder internal rotation    Shoulder external rotation 90     (Blank rows = not tested)  CERVICAL AROM: All within normal limits:    UPPER EXTREMITY STRENGTH: shoulders 3-5/5 due to pain from surgery . Otherwise 4+/5 pt reports general weakness from chemo as she feels she has lost muscle mass   LYMPHEDEMA ASSESSMENTS:   SURGERY TYPE/DATE: 01/24/2024 bilateral mastectomy   NUMBER OF LYMPH NODES REMOVED: 0  CHEMOTHERAPY: yes  RADIATION:yes, will start this week   HORMONE TREATMENT: yes  INFECTIONS: no  LYMPHEDEMA ASSESSMENTS:   LANDMARK RIGHT  eval  10 cm proximal to olecranon process 27  Olecranon process 23  10 cm proximal to ulnar styloid process 19.5  Just proximal to ulnar styloid process 15.3   Across hand at thumb web space 19  At base of 2nd digit 5.8  (Blank rows = not tested)  LANDMARK LEFT  eval  10 cm proximal to olecranon process 27  Olecranon process 24  10 cm proximal to ulnar styloid process 19.7  Just proximal to ulnar styloid process 15.7  Across hand at thumb web space 18.7  At base of 2nd digit 5.8  (Blank rows = not tested)    FUNCTIONAL TESTS:  30 seconds chair stand test  13 stit to stand in 30 seconds from low mat height      QUICK DASH SURVEY: 40.91; 04/10/24 - 43.18 (pt currently at end of 6 weeks of radiation which has increased her UE limitations/pain which is to be expected)  TREATMENT DATE:  04/10/24: Avoided AA/A/ROM stretches as pt reports she feels everything pulls her skin right now. So focused on manual therapy to Rt side only.  Quick DASH done for insurance auth/goal renewal but not sit-stand as pt reports increased fatigue from radiation Manual Therapy P/ROM: In supine to Rt shoulder into flex, abd and D2 with scapular depression throughout by therapist MFR fairly aggressively to cording that extends from Rt mastectomy incision to axilla and into medial upper arm and slightly inferior to mastectomy incision where pt also feels pull with prolonged holds when Rt UE in end range stretch; this some better today as pt still had kinesiotape on and she reports this has been helping since she can't currently wear compression due to radiated skin on Lt chest. Also prolonged pressure on Rt mastectomy incision where cording palpably meets scar tissue and some release felt towards end of session. Edema at chest was visibly more so improved after as well.  STM to Rt pectoralis insertion where palpably tight, avoiding area of port on Rt side MLD: Short neck, 5 diaphragmatic breaths, Rt inguinal nodes, Rt axillo-inguinal anastomosis, and  focused on small area of edema at Rt lateral trunk throughout P/ROM and STM Kineiotape removed today from last week and did not reapply today so her skin can rest before reapplication. Issued piece that pt can don at home no sooner than 24 hrs, she verbalized understanding.   04/04/24: Manual Therapy P/ROM: In supine to Rt shoulder into flex, abd and D2 with scapular depression throughout by therapist MFR to cording that extends from Rt mastectomy incision to axilla and into medial upper arm with prolonged holds when Rt UE in end range stretch; this seemed some worsened today due to increase edema at Rt lateral trunk/chest inferior to mastectomy incision as pt isn't able to wear her compression bra right now consistently due to increased skin changes at Lt chest wall  STM to Rt pectoralis insertion where palpably tight, avoiding area of port on Rt side MLD: Short neck, 5 diaphragmatic breaths, Rt inguinal nodes, Rt axillo-inguinal anastomosis, and focused on small area of edema at Rt lateral trunk throughout P/ROM and STM Kinesiotape along Rt axillo-inguinal anastomosis with bucket inferior to transverse watershed and 3 fingers running up to edema at lateral trunk (2 fingers inferior and 1 superior to mastectomy incision)  04/02/24: Therapeutic Exercises Pulleys into flex x 1 min but stopped due to increased discomfort at Lt axilla.  Manual Therapy P/ROM: In supine to Rt shoulder into flex, abd and D2 with scapular depression throughout by therapist MFR to cording that extends from Rt mastectomy incision to axilla and into medial upper arm with prolonged holds when Rt UE in end range stretch; this seemed some worsened today due to increase edema at Rt lateral trunk/chest inferior to mastectomy incision as pt isn't able to wear her compression bra right now consistently due to increased skin changes at Lt chest wall  STM to Rt pectoralis insertion where palpably tight, avoiding area of port on Rt  side MLD: Short neck, 5 diaphragmatic breaths, Rt inguinal nodes, Rt axillo-inguinal anastomosis, and focused on small area of edema at Rt lateral trunk throughout P/ROM and STM       PATIENT EDUCATION:  Education details: Prone I, Y, and T's  Person educated: Patient Education method: Explanation, Demonstration, Tactile cues, Verbal cues, and Handouts Education comprehension: verbalized understanding and returned demonstration and VC's during, will benefit from further review  HOME EXERCISE PROGRAM: Supine dowel  exercise Supine scapular series  Standing bil UE 3 way raises Prone I, Y, and T's  ASSESSMENT:  CLINICAL IMPRESSION: Pt reports noting feeling significant reduction of tolerance to A/ROM exercises and wear of compression due to near end of her 6.5 weeks of radiation with boost treatments. Her Quick DASH  has slightly worsened which is to be expected due to nature of radiation from 40.91 to 43.18. Pt has been working hard on compliance with her Rt UE stretching but is also starting to feel limited with this as it pulls across her Lt chest where she needs to be careful to prevent skin tearing. Did not reassess sit to stand today due to increased fatigue from radiation, however pt reports her endurance was much improved right before radiation began and she looks forward to returning to that level of activity once radiation complete and she can. Today continued with focus on Rt UE P/ROM with manual therapy techniques to decrease cording in Rt UE and trunk. Pt reports relisf felt after session today with improved end A/ROM noted and cording some softened. Pt will benefit from continued physical therapy at this time to help her return to PLOF once radiation completed next week.     OBJECTIVE IMPAIRMENTS: decreased endurance, decreased ROM, decreased strength, increased edema, increased fascial restrictions, impaired perceived functional ability, impaired UE functional use, postural  dysfunction, and pain.   ACTIVITY LIMITATIONS: carrying, lifting, reach over head, hygiene/grooming, and endurance   PARTICIPATION LIMITATIONS: will be having radiaiton   PERSONAL FACTORS: 3+ comorbidities: past chemo, liver mets and monitoring, will have radiation are also affecting patient's functional outcome.   REHAB POTENTIAL: Good  CLINICAL DECISION MAKING: Evolving/moderate complexity  EVALUATION COMPLEXITY: Moderate  GOALS: Goals reviewed with patient? Yes  SHORT TERM GOALS: Target date: 03/20/2024  Right and left shoulder flexion will be 160  Baseline: right is 140, left is 155; 04/10/24 - Rt 155 and Lt 142 degrees Goal status: ONGOING  2.  Pt will be independent in a basic home exercise program for shoulder ROM  Baseline: no knowledge ; 04/10/24 - pt doing well with initial HEP  Goal status: MET   LONG TERM GOALS: Target date: 04/21/2024  Pt will have painfree right shoulder abduction of 170 degrees  Baseline: 135 with pain ; Rt abd 155 degrees and less pain from cording Goal status: ONGOING  2.  Pt will be independent in managing trunk fullness with compression and manual lymph drainage  Baseline: no knowledge; 04/10/24 - pt was independent in managing except can't wear compression due to radiation, however kinesiotape is helping Goal status: PARTIALLY MET  3.  Pt will be independent in a stregnthening exercises program that she can do at home  Baseline: no knowledge; 04/10/24 - pt was progressing very well with this just had to hold strengthening due to radiation  Goal status: ONGOING  4.  Pt will decrease Quick Dash score to < 10  Baseline: 40.91; 04/10/24 - 43.18, slight increase to be expected as pt is nearing end of 6.5 weeks of radiation with boost Goal status: ONGOING  5.  Pt will increase 30 second sit to stand to 16 Baseline: 13 Goal status: ONGOING  PLAN:  PT FREQUENCY: 2x/week  PT DURATION: 8 weeks  PLANNED INTERVENTIONS: [id number]- PT Re-evaluation,  97750- Physical Performance Testing, 97110-Therapeutic exercises, 97530- Therapeutic activity, [id number]- Neuromuscular re-education, 97535- Self Care, [id number]- Manual therapy, Patient/Family education, Manual lymph drainage, Scar mobilization, and Compression bandaging  PLAN FOR NEXT SESSION:  Ins  shara mcbride  and MD renewal sent. Resume kinesiotape if skin healed. Cont following as able due to skin changes from radiation: In next 1-2 weeks resume prone I, Y, T's once pts skin heals and she can tolerate prone laying again; cont manual therapy focus on right side and cording in right axilla and pocket of lateral trunk edema and kinesiotape here prn; P/AA/AROM to both shoulders. Later instruct in Strength ABC Program, after she has healed from radiation.    Ann Daugherty, PTA 04/10/2024, 2:00 PM    [facility] [address] [place], [place], [address] Phone: [phone]   Fax:  [phone]   CHEST: Doorway, Bilateral - Standing    Standing in doorway with one foot in front of the other, place hands on wall with elbows bent at shoulder height. Lean forward. Hold _20-30__ seconds. _2-3__ reps per set, _2__ sets per day.  Strengthening: Horizontal Abduction - with External Rotation (Prone)    Holding __1-2__ pound weights, raise arms out from sides, pinching shoulder blades. Keep elbows straight, thumbs up. This is T position. Repeat __10__ times per set. Do _1-2___ sets per session. Do __1__ sessions every other day.  Then do same with arms in Y position, and last I position.     Cancer Rehab [phone]   PHYSICAL THERAPY DISCHARGE SUMMARY  Visits from Start of Care: 12  Current functional level related to goals / functional outcomes: See above   Remaining deficits: Skin desquamation from radiation leading to poor ROM and inability to perform therapy.     Education / Equipment: Had to stop therapy due to insurance denial  of more visits.   Plan: Patient agrees to discharge.  Patient is being discharged due to meeting the stated rehab goals.

## 2024-04-11 ENCOUNTER — Other Ambulatory Visit: Payer: Self-pay

## 2024-04-11 ENCOUNTER — Ambulatory Visit
Admission: RE | Admit: 2024-04-11 | Discharge: 2024-04-11 | Disposition: A | Discharge status: Home / Self Care | Source: Ambulatory Visit | Attending: Radiation Oncology | Admitting: Radiation Oncology

## 2024-04-11 DIAGNOSIS — C50212 Malignant neoplasm of upper-inner quadrant of left female breast: Secondary | ICD-10-CM | POA: Diagnosis not present

## 2024-04-11 LAB — RAD ONC ARIA SESSION SUMMARY
Course Elapsed Days: 38
Plan Fractions Treated to Date: 2
Plan Prescribed Dose Per Fraction: 2 Gy
Plan Total Fractions Prescribed: 5
Plan Total Prescribed Dose: 10 Gy
Reference Point Dosage Given to Date: 4 Gy
Reference Point Session Dosage Given: 2 Gy
Session Number: 27

## 2024-04-12 ENCOUNTER — Ambulatory Visit (HOSPITAL_COMMUNITY)
Admission: RE | Admit: 2024-04-12 | Discharge: 2024-04-12 | Disposition: A | Discharge status: Home / Self Care | Source: Ambulatory Visit | Attending: Interventional Radiology | Admitting: Interventional Radiology

## 2024-04-12 DIAGNOSIS — R932 Abnormal findings on diagnostic imaging of liver and biliary tract: Secondary | ICD-10-CM | POA: Diagnosis not present

## 2024-04-12 DIAGNOSIS — C787 Secondary malignant neoplasm of liver and intrahepatic bile duct: Secondary | ICD-10-CM | POA: Insufficient documentation

## 2024-04-12 MED ORDER — GADOBUTROL 1 MMOL/ML IV SOLN
6.0000 mL | Freq: Once | INTRAVENOUS | Status: AC | PRN
  Administered 2024-04-12: 6 mL via INTRAVENOUS

## 2024-04-14 ENCOUNTER — Ambulatory Visit
Admission: RE | Admit: 2024-04-14 | Discharge: 2024-04-14 | Disposition: A | Discharge status: Home / Self Care | Source: Ambulatory Visit | Attending: Radiation Oncology | Admitting: Radiation Oncology

## 2024-04-14 ENCOUNTER — Ambulatory Visit
Admission: RE | Admit: 2024-04-14 | Discharge: 2024-04-14 | Disposition: A | Discharge status: Home / Self Care | Source: Ambulatory Visit | Attending: Radiation Oncology

## 2024-04-14 ENCOUNTER — Inpatient Hospital Stay (HOSPITAL_BASED_OUTPATIENT_CLINIC_OR_DEPARTMENT_OTHER): Attending: Hematology and Oncology | Admitting: Hematology and Oncology

## 2024-04-14 ENCOUNTER — Other Ambulatory Visit: Payer: Self-pay

## 2024-04-14 VITALS — BP 109/60 | HR 73 | Temp 98.1°F | Resp 18 | Ht 66.0 in | Wt 136.9 lb

## 2024-04-14 DIAGNOSIS — K769 Liver disease, unspecified: Secondary | ICD-10-CM | POA: Insufficient documentation

## 2024-04-14 DIAGNOSIS — Z170 Estrogen receptor positive status [ER+]: Secondary | ICD-10-CM | POA: Insufficient documentation

## 2024-04-14 DIAGNOSIS — Z79810 Long term (current) use of selective estrogen receptor modulators (SERMs): Secondary | ICD-10-CM | POA: Insufficient documentation

## 2024-04-14 DIAGNOSIS — C50212 Malignant neoplasm of upper-inner quadrant of left female breast: Secondary | ICD-10-CM | POA: Diagnosis not present

## 2024-04-14 LAB — RAD ONC ARIA SESSION SUMMARY
Course Elapsed Days: 41
Plan Fractions Treated to Date: 3
Plan Prescribed Dose Per Fraction: 2 Gy
Plan Total Fractions Prescribed: 5
Plan Total Prescribed Dose: 10 Gy
Reference Point Dosage Given to Date: 6 Gy
Reference Point Session Dosage Given: 2 Gy
Session Number: 28

## 2024-04-14 NOTE — Assessment & Plan Note (Signed)
 07/19/2023:Palpable left breast mass for 6 months.  Mammogram and ultrasound revealed large masses.   10:30 position: 4.3 cm with skin thickening and ulceration: Grade 2 IDC with high-grade DCIS ER 95%, PR 95%, Ki67 30%, HER2 2+ by IHC, FISH negative ratio 1.37;  5 o'clock position: 3.3 cm with calcifications spanning 6.7 cm, axilla negative, biopsy: Grade 3 IDC with necrosis ER 95% PR 90% Ki-67 60% HER2 1+ negative   CT CAP 08/03/2023: 2 prominent left breast masses, left axillary lymph nodes, right hepatic lobe lesion concerning for metastatic breast cancer Ultrasound a liver biopsy scheduled for 08/17/2023   Treatment plan: Neoadjuvant chemotherapy with dose Adriamycin  and Cytoxan  followed by Taxol  completed 12/27/2023 Mastectomy left breast 01/24/2024 Adjuvant radiation Liver directed therapy (underwent microwave ablation 01/16/2024) Antiestrogen therapy with CDK inhibitors (currently on tamoxifen , will switch to anastrozole after hysterectomy) ---------------------------------------------------------------------------------------------------- MRI liver 12/26/2023: Significantly diminished size of hepatic metastases (0.6 cm, 1.4 cm, (used to be 3.6 cm), 0.7 cm) MRI breast 12/31/2023: Significant positive response to chemo.  Both left malignancies decrease in size (3 cm with enhancement) and 1.1 cm CT CAP 12/26/2023: Significant decrease in breast masses, resolution of left axillary lymph nodes, decrease liver metastases   01/24/24: Bilateral Mastectomies Right: Benign Left: 2 residual tumors 2.8 cm and 1.6 cm, Margins neg, DCIS, ER 85%, PR: 25%, Her 2: 1+ Neg, Ki 67: 10%   Recommendation: Anti-estrogen therapy with Verzenio Abemaciclib toxicities:  I also support her decision for hysterectomy and bilateral salpingo-oophorectomy.  She will do this after radiation is completed.   Liver MRI 04/12/2024: Stable 6 mm subcapsular left hepatic dome lesion similar, wedge-shaped deformity right hepatic  lobe 1 x 2 cm without enhancement (treated metastatic lesion)  Return to clinic 1 month with labs and follow-up RTC at the end of radiation to commence Verzinio.

## 2024-04-14 NOTE — Progress Notes (Signed)
 Patient Care Team: Cameron Cea, MD as PCP - General (Hematology and Oncology) Obgyn, Corbin Dess as Consulting Physician (Obstetrics and Gynecology) Gearl Keens, MD as Consulting Physician (Neurosurgery) Alane Hsu, RN as Oncology Nurse Navigator Auther Bo, RN as Oncology Nurse Navigator Cameron Cea, MD as Consulting Physician (Hematology and Oncology) Lockie Rima, MD as Consulting Physician (General Surgery) Colie Dawes, MD as Attending Physician (Radiation Oncology)  DIAGNOSIS:  Encounter Diagnosis  Name Primary?   Malignant neoplasm of upper-inner quadrant of left breast in female, estrogen receptor positive (HCC) Yes    SUMMARY OF ONCOLOGIC HISTORY: Oncology History  Malignant neoplasm of upper-inner quadrant of left female breast (HCC)  07/19/2023 Initial Diagnosis   Palpable left breast mass for 6 months.  Mammogram and ultrasound revealed large masses.  10:30 position: 4.3 cm with skin thickening and ulceration: Grade 2 IDC with high-grade DCIS ER 95%, PR 95%, Ki67 30%, HER2 2+ by IHC, FISH negative ratio 1.37; 5 o'clock position: 3.3 cm with calcifications spanning 6.7 cm, axilla negative, biopsy: Grade 3 IDC with necrosis ER 95% PR 90% Ki-67 60% HER2 1+ negative   07/26/2023 Cancer Staging   Staging form: Breast, AJCC 8th Edition - Clinical: Stage IIIB (cT4b, cN0, cM0, G3, ER+, PR+, HER2-) - Signed by Cameron Cea, MD on 07/26/2023 Histologic grading system: 3 grade system   08/15/2023 -  Chemotherapy   Patient is on Treatment Plan : BREAST ADJUVANT DOSE DENSE AC q14d / PACLitaxel  q7d     08/17/2023 Initial Biopsy   Liver biopsy: consistent with metastatic carcinoma, breast primary.     08/21/2023 Cancer Staging   Staging form: Breast, AJCC 8th Edition - Pathologic: Stage IV (pM1) - Signed by Percival Brace, NP on 08/21/2023     CHIEF COMPLIANT: Follow-up after recent MRI of the abdomen  HISTORY OF PRESENT ILLNESS:  History of Present  Illness Ann Daugherty "[facility]" is a 53 year old female with a history of liver lesions and breast cancer who presents for follow-up regarding her treatment and upcoming hysterectomy.  She is on tamoxifen  and tolerates it well. A stable 6 mm lesion is present on the left hepatic dome, consistent with previous imaging. She has undergone ablation for other areas with no new findings. Routine liver scans occur every three months. She is considering a PET scan as a baseline, alternating with CT scans in the future. She previously underwent microwave ablation and was satisfied with the procedure, avoiding more invasive options.     ALLERGIES:  has no known allergies.  MEDICATIONS:  Current Outpatient Medications  Medication Sig Dispense Refill   acetaminophen  (TYLENOL ) 500 MG tablet Take 2 tablets (1,000 mg total) by mouth every 6 (six) hours. 30 tablet 0   Cholecalciferol (VITAMIN D3) 5000 units CAPS Take 5,000 Units by mouth daily.     citalopram  (CELEXA ) 40 MG tablet Take 1 tablet (40 mg total) by mouth daily. 90 tablet 1   gabapentin  (NEURONTIN ) 300 MG capsule Take 1 capsule (300 mg total) by mouth 2 (two) times daily. 60 capsule 2   ibuprofen  (ADVIL ) 800 MG tablet Take 1 tablet (800 mg total) by mouth every 8 (eight) hours as needed. 60 tablet 2   lidocaine -prilocaine  (EMLA ) cream Apply to affected area once 30 g 3   LORazepam  (ATIVAN ) 0.5 MG tablet Take 1 tablet (0.5 mg total) by mouth 2 (two) times daily as needed for anxiety. 30 tablet 1   methocarbamol  (ROBAXIN ) 500 MG tablet Take 1 tablet (500  mg total) by mouth every 6 (six) hours as needed for muscle spasms. 30 tablet 2   oxyCODONE  (OXY IR/ROXICODONE ) 5 MG immediate release tablet Take 1-2 tablets (5-10 mg total) by mouth every 6 (six) hours as needed for severe pain (pain score 7-10). 40 tablet 0   senna (SENOKOT) 8.6 MG TABS tablet Take 1 tablet (8.6 mg total) by mouth 2 (two) times daily. 120 tablet 0   tamoxifen  (NOLVADEX ) 20 MG  tablet Take 1 tablet (20 mg total) by mouth daily. Stop 1 week before surgery 30 tablet 1   No current facility-administered medications for this visit.    PHYSICAL EXAMINATION: ECOG PERFORMANCE STATUS: 1 - Symptomatic but completely ambulatory  Vitals:   04/14/24 1142  BP: 109/60  Pulse: 73  Resp: 18  Temp: 98.1 F (36.7 C)  SpO2: 99%   Filed Weights   04/14/24 1142  Weight: 136 lb 14.4 oz (62.1 kg)      LABORATORY DATA:  I have reviewed the data as listed    Latest Ref Rng & Units 01/16/2024    8:19 AM 12/27/2023    9:30 AM 12/20/2023    8:45 AM  CMP  Glucose 70 - 99 mg/dL 981  191  478   BUN 6 - 20 mg/dL 14  11  10    Creatinine 0.44 - 1.00 mg/dL 2.95  6.21  3.08   Sodium 135 - 145 mmol/L 138  136  137   Potassium 3.5 - 5.1 mmol/L 3.8  4.1  3.6   Chloride 98 - 111 mmol/L 104  104  104   CO2 22 - 32 mmol/L 27  28  29    Calcium 8.9 - 10.3 mg/dL 9.0  9.0  9.2   Total Protein 6.5 - 8.1 g/dL 6.6  6.2  6.7   Total Bilirubin 0.0 - 1.2 mg/dL 0.5  0.2  0.3   Alkaline Phos 38 - 126 U/L 56  68  71   AST 15 - 41 U/L 21  14  14    ALT 0 - 44 U/L 18  11  12      Lab Results  Component Value Date   WBC 4.0 01/16/2024   HGB 11.9 (L) 01/16/2024   HCT 35.0 (L) 01/16/2024   MCV 93.6 01/16/2024   PLT 241 01/16/2024   NEUTROABS 2.6 01/16/2024    ASSESSMENT & PLAN:  Malignant neoplasm of upper-inner quadrant of left female breast (HCC) 07/19/2023:Palpable left breast mass for 6 months.  Mammogram and ultrasound revealed large masses.   10:30 position: 4.3 cm with skin thickening and ulceration: Grade 2 IDC with high-grade DCIS ER 95%, PR 95%, Ki67 30%, HER2 2+ by IHC, FISH negative ratio 1.37;  5 o'clock position: 3.3 cm with calcifications spanning 6.7 cm, axilla negative, biopsy: Grade 3 IDC with necrosis ER 95% PR 90% Ki-67 60% HER2 1+ negative   CT CAP 08/03/2023: 2 prominent left breast masses, left axillary lymph nodes, right hepatic lobe lesion concerning for metastatic  breast cancer Ultrasound a liver biopsy scheduled for 08/17/2023   Treatment plan: Neoadjuvant chemotherapy with dose Adriamycin  and Cytoxan  followed by Taxol  completed 12/27/2023 Mastectomy left breast 01/24/2024 Adjuvant radiation Liver directed therapy (underwent microwave ablation 01/16/2024) Antiestrogen therapy with CDK inhibitors (currently on tamoxifen , will switch to anastrozole after hysterectomy) ---------------------------------------------------------------------------------------------------- MRI liver 12/26/2023: Significantly diminished size of hepatic metastases (0.6 cm, 1.4 cm, (used to be 3.6 cm), 0.7 cm) MRI breast 12/31/2023: Significant positive response to chemo.  Both left malignancies  decrease in size (3 cm with enhancement) and 1.1 cm CT CAP 12/26/2023: Significant decrease in breast masses, resolution of left axillary lymph nodes, decrease liver metastases   01/24/24: Bilateral Mastectomies Right: Benign Left: 2 residual tumors 2.8 cm and 1.6 cm, Margins neg, DCIS, ER 85%, PR: 25%, Her 2: 1+ Neg, Ki 67: 10%   Recommendation: Anti-estrogen therapy with Verzenio (will start after she recovers from hysterectomy)    I also support her decision for hysterectomy and bilateral salpingo-oophorectomy.  She will do this after radiation is completed.   Liver MRI 04/12/2024: Stable 6 mm subcapsular left hepatic dome lesion similar, wedge-shaped deformity right hepatic lobe 1 x 2 cm without enhancement (treated metastatic lesion)  Return to clinic 2 month with labs and follow-up RTC in 2 months to start Verzinio with letrozole (once hysterectomy and BSO are done) ------------------------------------- Assessment and Plan Assessment & Plan Malignant neoplasm of upper-inner quadrant of left breast, estrogen receptor positive Currently on tamoxifen . Scheduled for hysterectomy consultation. Tamoxifen  to be stopped pre-surgery due to thromboembolic risk. Post-surgery medication  transition considered. Discussed concerns about medication interruption and cancer progression risk. - Stop tamoxifen  one week before hysterectomy. - Resume tamoxifen  one week after hysterectomy until further discussion. - Consider switching to Verzilium post-recovery. - Recheck in two months to discuss medication transition.  Hepatic lesion Lesion well-managed with no new findings. Routine imaging planned. PET scan scheduled to establish baseline and assess for active disease. Discussed radiation exposure and approval process for PET scans. - Order PET scan in two months to establish baseline and assess for active disease. - Schedule follow-up appointment one week after PET scan to review results. - Consider CT scan if insurance issues arise.  Follow-up Routine imaging and lab work planned post-hysterectomy. Pharmacist class scheduled before new medications. - Schedule routine labs with PET scan. - Arrange hour-long class with pharmacist before starting new medications.      No orders of the defined types were placed in this encounter.  The patient has a good understanding of the overall plan. she agrees with it. she will call with any problems that may develop before the next visit here. Total time spent: 30 mins including face to face time and time spent for planning, charting and co-ordination of care   Margert Sheerer, MD 04/14/24

## 2024-04-15 ENCOUNTER — Ambulatory Visit

## 2024-04-15 ENCOUNTER — Other Ambulatory Visit: Payer: Self-pay

## 2024-04-15 ENCOUNTER — Ambulatory Visit
Admission: RE | Admit: 2024-04-15 | Discharge: 2024-04-15 | Disposition: A | Discharge status: Home / Self Care | Source: Ambulatory Visit | Attending: Radiation Oncology

## 2024-04-15 DIAGNOSIS — C50212 Malignant neoplasm of upper-inner quadrant of left female breast: Secondary | ICD-10-CM | POA: Diagnosis not present

## 2024-04-15 LAB — RAD ONC ARIA SESSION SUMMARY
Course Elapsed Days: 42
Plan Fractions Treated to Date: 4
Plan Prescribed Dose Per Fraction: 2 Gy
Plan Total Fractions Prescribed: 5
Plan Total Prescribed Dose: 10 Gy
Reference Point Dosage Given to Date: 8 Gy
Reference Point Session Dosage Given: 2 Gy
Session Number: 29

## 2024-04-16 ENCOUNTER — Ambulatory Visit
Admission: RE | Admit: 2024-04-16 | Discharge: 2024-04-16 | Disposition: A | Discharge status: Home / Self Care | Source: Ambulatory Visit | Attending: Radiation Oncology | Admitting: Radiation Oncology

## 2024-04-16 ENCOUNTER — Other Ambulatory Visit: Payer: Self-pay

## 2024-04-16 ENCOUNTER — Ambulatory Visit

## 2024-04-16 DIAGNOSIS — Z510 Encounter for antineoplastic radiation therapy: Secondary | ICD-10-CM | POA: Diagnosis not present

## 2024-04-16 DIAGNOSIS — C50212 Malignant neoplasm of upper-inner quadrant of left female breast: Secondary | ICD-10-CM | POA: Diagnosis not present

## 2024-04-16 DIAGNOSIS — Z170 Estrogen receptor positive status [ER+]: Secondary | ICD-10-CM | POA: Diagnosis not present

## 2024-04-16 LAB — RAD ONC ARIA SESSION SUMMARY
Course Elapsed Days: 43
Plan Fractions Treated to Date: 5
Plan Prescribed Dose Per Fraction: 2 Gy
Plan Total Fractions Prescribed: 5
Plan Total Prescribed Dose: 10 Gy
Reference Point Dosage Given to Date: 10 Gy
Reference Point Session Dosage Given: 2 Gy
Session Number: 30

## 2024-04-17 ENCOUNTER — Ambulatory Visit

## 2024-04-17 ENCOUNTER — Ambulatory Visit
Admission: RE | Admit: 2024-04-17 | Discharge: 2024-04-17 | Disposition: A | Discharge status: Home / Self Care | Source: Ambulatory Visit | Attending: Interventional Radiology | Admitting: Interventional Radiology

## 2024-04-17 DIAGNOSIS — Z419 Encounter for procedure for purposes other than remedying health state, unspecified: Secondary | ICD-10-CM | POA: Diagnosis not present

## 2024-04-17 DIAGNOSIS — C50912 Malignant neoplasm of unspecified site of left female breast: Secondary | ICD-10-CM | POA: Diagnosis not present

## 2024-04-17 DIAGNOSIS — C787 Secondary malignant neoplasm of liver and intrahepatic bile duct: Secondary | ICD-10-CM

## 2024-04-17 HISTORY — PX: IR RADIOLOGIST EVAL & MGMT: IMG5224

## 2024-04-17 NOTE — Progress Notes (Signed)
 This encounter was conducted via the [employer] providing interactive audio and visual communication.  The patient provided verbal consent to conduct a virtual appointment.  The patient was located at their primary residence during this encounter.  Chief Complaint: Patient was seen in consultation today for breast cancer metastatic to the liver at the request of Sahas Sluka K  Referring Physician(s): Krissa Utke K  History of Present Illness: Ann Daugherty is a 53 y.o. female with a history of left breast cancer stage IV -  (cT4b, cN0, cM1, G3, ER+, PR+, HER2-).  She has lesions in her liver and underwent biopsy of one of the liver lesions in October 2024 confirming metastatic carcinoma from breast primary.   She has recently completed neoadjuvant chemotherapy with Adriamycin  and Cytoxan  followed by Taxol  completed in February 2025.   She is now a candidate for potential liver directed therapy.  The dominant lesion in segment 6 has regressed from 3.6 cm to 1.4 cm and is an excellent candidate for percutaneous microwave ablation.   There is a smaller subcentimeter lesion in the anterior dome which is questionable and warrants close observation.   She had a technically successful percutaneous thermal ablation of the segment 6 lesion on 01/16/2024.  The procedure went well and without complication and she was discharged home the same day.   MRI 04/12/24 - Nonenhancing area in the right hepatic lobe, segment 6 measuring 1.0 x 2.0 cm. There is no abnormal enhancement. Findings favor treated metastatic lesion. - There is a stable approximately 6 x 6 mm enhancing lesion in the subcapsular left hepatic dome, segment 4A, which appears grossly similar to the prior study. No new suspicious liver lesions seen.  We had a video conference visit today for her 3 month follow-up evaluation.    She states that her treatments have been life-changing and her mental and emotional state is  greatly improved knowing that the tumors are out of her body.  She is extremely pleased with the entire process and has no complaints.  Past Medical History:  Diagnosis Date   ADD (attention deficit disorder) 2015   Does not take any medication   Anxiety    Cancer (HCC)    breast   Depression    GERD (gastroesophageal reflux disease)    History of chicken pox    PONV (postoperative nausea and vomiting)     Past Surgical History:  Procedure Laterality Date   BACK SURGERY     BREAST BIOPSY Left 07/19/2023   US  LT BREAST BX W LOC DEV EA ADD LESION IMG BX SPEC US  GUIDE 07/19/2023 [facility]   BREAST BIOPSY Left 07/19/2023   US  LT BREAST BX W LOC DEV 1ST LESION IMG BX SPEC US  GUIDE 07/19/2023 [facility]   BREAST BIOPSY Right 08/09/2023   US  RT BREAST BX W LOC DEV 1ST LESION IMG BX SPEC US  GUIDE 08/09/2023 [facility]   BREAST BIOPSY Right 08/09/2023   US  RT BREAST BX W LOC DEV EA ADD LESION IMG BX SPEC US  GUIDE 08/09/2023 [facility]   IR GUIDED LIVER TUMOR ABLATION RFA PERC     IR RADIOLOGIST EVAL & MGMT  01/08/2024   IR RADIOLOGIST EVAL & MGMT  01/31/2024   LAMINECTOMY     2007, 2011   PORTACATH PLACEMENT Right 08/14/2023   Procedure: INSERTION PORT-A-CATH WITH ULTRASOUND Vicenta Graft;  Surgeon: Lockie Rima, MD;  Location: [facility];  Service: General;  Laterality: Right;   SIMPLE MASTECTOMY WITH AXILLARY SENTINEL NODE  BIOPSY Bilateral 01/24/2024   Procedure: BILATERAL MASTECTOMY;  Surgeon: Lockie Rima, MD;  Location: [facility];  Service: General;  Laterality: Bilateral;  PEC BLOCK   VAGINAL DELIVERY  2004, 2006    Allergies: Patient has no known allergies.  Medications: Prior to Admission medications   Medication Sig Start Date End Date Taking? Authorizing Provider  acetaminophen  (TYLENOL ) 500 MG tablet Take 2 tablets (1,000 mg total) by mouth every 6 (six) hours. 01/24/24   Lockie Rima, MD  Cholecalciferol (VITAMIN D3) 5000 units CAPS  Take 5,000 Units by mouth daily. 12/07/16   [provider]  citalopram  (CELEXA ) 40 MG tablet Take 1 tablet (40 mg total) by mouth daily. 10/24/23   Percival Brace, NP  gabapentin  (NEURONTIN ) 300 MG capsule Take 1 capsule (300 mg total) by mouth 2 (two) times daily. 01/24/24   Lockie Rima, MD  ibuprofen  (ADVIL ) 800 MG tablet Take 1 tablet (800 mg total) by mouth every 8 (eight) hours as needed. 01/24/24   Lockie Rima, MD  lidocaine -prilocaine  (EMLA ) cream Apply to affected area once 08/10/23   Gudena, Vinay, MD  LORazepam  (ATIVAN ) 0.5 MG tablet Take 1 tablet (0.5 mg total) by mouth 2 (two) times daily as needed for anxiety. 02/14/24   Gudena, Vinay, MD  methocarbamol  (ROBAXIN ) 500 MG tablet Take 1 tablet (500 mg total) by mouth every 6 (six) hours as needed for muscle spasms. 01/24/24   Lockie Rima, MD  oxyCODONE  (OXY IR/ROXICODONE ) 5 MG immediate release tablet Take 1-2 tablets (5-10 mg total) by mouth every 6 (six) hours as needed for severe pain (pain score 7-10). 04/08/24   Colie Dawes, MD  senna (SENOKOT) 8.6 MG TABS tablet Take 1 tablet (8.6 mg total) by mouth 2 (two) times daily. 01/24/24   Lockie Rima, MD  tamoxifen  (NOLVADEX ) 20 MG tablet Take 1 tablet (20 mg total) by mouth daily. Stop 1 week before surgery 03/10/24   Cameron Cea, MD     Family History  Problem Relation Age of Onset   Breast cancer Mother 55   Colon cancer Mother 42   Asthma Mother    Hypertension Mother    Hearing loss Mother    Hypertension Father    Diabetes Father    Hyperlipidemia Father    Learning disabilities Son    ADD / ADHD Son    Ovarian cancer Maternal Grandmother    Hypertension Maternal Grandfather    Hyperlipidemia Maternal Grandfather    Heart disease Maternal Grandfather    Stroke Maternal Grandfather    Mental illness Paternal Grandmother    Hypertension Paternal Grandfather     Social History   Socioeconomic History   Marital status: Divorced    Spouse name: Not on  file   Number of children: 2   Years of education: Not on file   Highest education level: Not on file  Occupational History   Not on file  Tobacco Use   Smoking status: Former    Current packs/day: 0.00    Average packs/day: 0.5 packs/day for 30.0 years (15.0 ttl pk-yrs)    Types: Cigarettes    Start date: 04/07/1991    Quit date: 04/06/2021    Years since quitting: 3.0   Smokeless tobacco: Current   Tobacco comments:    Uses a jewel  Vaping Use   Vaping status: Every Day   Substances: Nicotine  Substance and Sexual Activity   Alcohol use: Yes    Comment: 3-4 drinks a year  Drug use: No   Sexual activity: Not Currently    Birth control/protection: None  Other Topics Concern   Not on file  Social History Narrative   2  sons: 20 y.o. and 30 y.o.    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   Social Drivers of Health   Financial Resource Strain: Not on file  Food Insecurity: Food Insecurity Present (02/19/2024)   Hunger Vital Sign    Worried About Running Out of Food in the Last Year: Sometimes true    Ran Out of Food in the Last Year: Sometimes true  Transportation Needs: No Transportation Needs (02/19/2024)   PRAPARE - Administrator, Civil Service (Medical): No    Lack of Transportation (Non-Medical): No  Physical Activity: Not on file  Stress: Not on file  Social Connections: Not on file    ECOG Status: 0 - Asymptomatic  Review of Systems: A 12 point ROS discussed and pertinent positives are indicated in the HPI above.  All other systems are negative.  Review of Systems  Vital Signs: There were no vitals taken for this visit.    Physical Exam Constitutional:      General: She is not in acute distress.    Appearance: Normal appearance. She is normal weight.  HENT:     Head: Normocephalic and atraumatic.   Eyes:     General: No scleral icterus.  Pulmonary:     Effort: Pulmonary effort is normal.   Neurological:     Mental Status: She is alert  and oriented to person, place, and time.   Psychiatric:        Mood and Affect: Mood normal.        Behavior: Behavior normal.       Imaging: MR ABDOMEN W WO CONTRAST Result Date: 04/12/2024 CLINICAL DATA:  History provided by technologist Cancer, metastatic to liver EXAM: MRI ABDOMEN WITHOUT AND WITH CONTRAST TECHNIQUE: Multiplanar multisequence MR imaging of the abdomen was performed both before and after the administration of intravenous contrast. CONTRAST:  6mL GADAVIST  GADOBUTROL  1 MMOL/ML IV SOLN COMPARISON:  CT scan chest, abdomen and pelvis from 12/24/2023 and MRI abdomen from 12/21/2023. FINDINGS: Lower chest: Unremarkable MR appearance to the lung bases. No pleural effusion. No pericardial effusion. Normal heart size. Hepatobiliary: The liver is normal in size. Noncirrhotic configuration. *There is a peripheral triangular/wedge-shaped mildly T2 hyperintense, nonenhancing area in the right hepatic lobe, segment 6 measuring 1.0 x 2.0 cm (series 16, image 40). There is no abnormal enhancement. Findings favor treated metastatic lesion. *There is a stable approximately 6 x 6 mm enhancing lesion in the subcapsular left hepatic dome, segment 4A (series 20, image 20), which appears grossly similar to the prior study. *Previously described 7 mm lesion in the medial hepatic segment 7 (series 6, image 3 of prior exam dated 12/21/2023), is not seen on today's exam. No new suspicious liver lesion seen. The hepatic and portal veins are patent. No intrahepatic or extrahepatic bile duct dilatation. No choledocholithiasis. Unremarkable gallbladder. Pancreas: No mass, inflammatory changes or other parenchymal abnormality identified. No main pancreatic duct dilation. Spleen:  Within normal limits in size and appearance. No focal mass. Adrenals/Urinary Tract: Unremarkable adrenal glands. No hydroureteronephrosis. No suspicious renal mass. Stomach/Bowel: Visualized portions within the abdomen are unremarkable. No  disproportionate dilation of bowel loops. Vascular/Lymphatic: No pathologically enlarged lymph nodes identified. No abdominal aortic aneurysm demonstrated. No ascites. Other:  None. Musculoskeletal: No suspicious bone lesions identified. IMPRESSION: 1. There is a  stable approximately 6 x 6 mm enhancing lesion in the subcapsular left hepatic dome, segment 4A, which appears grossly similar to the prior study. No new suspicious liver lesions seen. 2. There is a peripheral triangular/wedge-shaped mildly T2 hyperintense, nonenhancing area in the right hepatic lobe, segment 6 measuring 1.0 x 2.0 cm. There is no abnormal enhancement. Findings favor treated metastatic lesion. 3. Multiple other nonacute observations, as described above. Electronically Signed   By: Beula Brunswick M.D.   On: 04/12/2024 10:31    Labs:  CBC: Recent Labs    12/12/23 1224 12/20/23 0845 12/27/23 0909 01/16/24 0819  WBC 5.6 4.3 6.1 4.0  HGB 11.2* 12.0 11.5* 11.9*  HCT 33.4* 35.6* 34.4* 35.0*  PLT 281 344 305 241    COAGS: Recent Labs    08/17/23 1115 01/16/24 0819  INR 1.0 1.0    BMP: Recent Labs    12/12/23 1224 12/20/23 0845 12/27/23 0930 01/16/24 0819  NA 136 137 136 138  K 3.5 3.6 4.1 3.8  CL 104 104 104 104  CO2 27 29 28 27   GLUCOSE 99 130* 100* 106*  BUN 13 10 11 14   CALCIUM 9.3 9.2 9.0 9.0  CREATININE 0.55 0.66 0.60 0.62  GFRNONAA >60 >60 >60 >60    LIVER FUNCTION TESTS: Recent Labs    12/12/23 1224 12/20/23 0845 12/27/23 0930 01/16/24 0819  BILITOT 0.3 0.3 0.2 0.5  AST 13* 14* 14* 21  ALT 13 12 11 18   ALKPHOS 68 71 68 56  PROT 6.5 6.7 6.2* 6.6  ALBUMIN 4.1 4.1 4.0 3.7    TUMOR MARKERS: No results for input(s): AFPTM, CEA, CA199, CHROMGRNA in the last 8760 hours.  Assessment and Plan:  53 year-old female doing very well 3 months post MWA of breast cancer met to the liver.  MRI shows excellent response to treatment with no residual enhancing tissue.  The other tiny  subcentimeter subcapsular nodule remains unchanged.  We will continue close surveillance.   1.) MRI with gado and clinic visit in 3 months time.     Electronically Signed: Roxie Cord 04/17/2024, 3:25 PM   This encounter was conducted via the [employer] providing interactive audio and visual communication.  The patient provided verbal consent to conduct a virtual appointment.  The patient was located at their primary residence during this encounter.   I spent a total of 15 Minutes in face to face in clinical consultation, greater than 50% of which was counseling/coordinating care for breast cancer mets to liver

## 2024-04-17 NOTE — Radiation Completion Notes (Signed)
 Patient Name: Ann Daugherty, Ann Daugherty MRN: [id number] Date of Birth: 1970-12-12 Referring Physician: Cameron Cea, M.D. Date of Service: 2024-04-17 Radiation Oncologist: Colie Dawes, M.D. [facility]                             RADIATION ONCOLOGY END OF TREATMENT NOTE     Diagnosis: C50.212 Malignant neoplasm of upper-inner quadrant of left female breast Staging on 2023-07-26: Malignant neoplasm of upper-inner quadrant of left female breast (HCC) T=cT4b, N=cN0, M=cM0 Intent: Curative     ==========DELIVERED PLANS==========  First Treatment Date: 2024-03-04 Last Treatment Date: 2024-04-16   Plan Name: CW_L_BH_BO Site: Chest Wall, Left Technique: 3D Mode: Photon Dose Per Fraction: 2 Gy Prescribed Dose (Delivered / Prescribed): 26 Gy / 26 Gy Prescribed Fxs (Delivered / Prescribed): 13 / 13   Plan Name: CW_L_SCV_BH Site: Chest Wall, Left Technique: 3D Mode: Photon Dose Per Fraction: 2 Gy Prescribed Dose (Delivered / Prescribed): 50 Gy / 50 Gy Prescribed Fxs (Delivered / Prescribed): 25 / 25   Plan Name: CW_L_Bst_BO Site: Chest Wall, Left Technique: Electron Mode: Electron Dose Per Fraction: 2 Gy Prescribed Dose (Delivered / Prescribed): 10 Gy / 10 Gy Prescribed Fxs (Delivered / Prescribed): 5 / 5   Plan Name: CW_L_BH Site: Chest Wall, Left Technique: 3D Mode: Photon Dose Per Fraction: 2 Gy Prescribed Dose (Delivered / Prescribed): 24 Gy / 24 Gy Prescribed Fxs (Delivered / Prescribed): 12 / 12     ==========ON TREATMENT VISIT DATES========== 2024-03-10, 2024-03-17, 2024-03-24, 2024-03-26, 2024-04-08, 2024-04-14     ==========UPCOMING VISITS==========       ==========APPENDIX - ON TREATMENT VISIT NOTES==========   See weekly On Treatment Notes in Epic for details in the Media tab (listed as Progress notes on the On Treatment Visit Dates listed above).

## 2024-04-21 ENCOUNTER — Encounter: Payer: Self-pay | Admitting: Obstetrics and Gynecology

## 2024-04-21 ENCOUNTER — Ambulatory Visit (INDEPENDENT_AMBULATORY_CARE_PROVIDER_SITE_OTHER): Admitting: Obstetrics and Gynecology

## 2024-04-21 ENCOUNTER — Other Ambulatory Visit (HOSPITAL_COMMUNITY)
Admission: RE | Admit: 2024-04-21 | Discharge: 2024-04-21 | Disposition: A | Discharge status: Home / Self Care | Source: Ambulatory Visit | Attending: Obstetrics and Gynecology | Admitting: Obstetrics and Gynecology

## 2024-04-21 VITALS — BP 101/66 | HR 76 | Ht 66.0 in | Wt 133.0 lb

## 2024-04-21 DIAGNOSIS — Z170 Estrogen receptor positive status [ER+]: Secondary | ICD-10-CM | POA: Diagnosis not present

## 2024-04-21 DIAGNOSIS — Z124 Encounter for screening for malignant neoplasm of cervix: Secondary | ICD-10-CM | POA: Diagnosis not present

## 2024-04-21 DIAGNOSIS — Z01818 Encounter for other preprocedural examination: Secondary | ICD-10-CM | POA: Diagnosis not present

## 2024-04-21 DIAGNOSIS — C50212 Malignant neoplasm of upper-inner quadrant of left female breast: Secondary | ICD-10-CM

## 2024-04-21 NOTE — Progress Notes (Signed)
   NEW GYNECOLOGY VISIT  Subjective:  Ann Daugherty is a 53 y.o. G4P0 with stage IV ER/PR+, HER2- breast cancer (mets to liver) presenting for discussion of hysterectomy/BSO  Initial diagnosis 07/2023. She underwent neoadjuvant chemotherapy 08/15/23-12/27/23 followed by bilateral mastectomies 01/24/24. She then completed adjuvant RT to the chest wall from 03/04/24-04/16/24. She also completed microwave ablation of her larger liver lesion. She is currently on tamoxifen  and her med onc is Dr. Odean.   She was recommended hysterectomy/BSO after completion of her radiation therapy to allow for treatment with Verzenio & anastrozole as well as reduce uterine cancer risk given her history of tamoxifen  use. She was perimenopausal prior to her diagnosis and reports that her periods stopped once she started tamoxifen .  She has never had abdominal surgery. SVD x2. She vapes nicotine regularly.  She had a NILM/HPV positive pap 07/17/23.   I personally reviewed notes from Dr. Odean on 01/03/24 & 04/14/24, Dr. Aron on 02/15/24 and Dr. Izell on 04/16/24.   Objective:   Vitals:   04/21/24 0832  BP: 101/66  Pulse: 76  Weight: 133 lb (60.3 kg)  Height: 5' 6 (1.676 m)   General:  Alert, oriented and cooperative. Patient is in no acute distress.  Skin: Skin is warm and dry. No rash noted.   Cardiovascular: Normal heart rate noted  Respiratory: Normal respiratory effort, no problems with respiration noted  Abdomen: Soft, non-tender, non-distended   Pelvic: NEFG. Uterus small, mobile. Cervix visually normal, pap collected. No adnexal mass or tenderness  Exam performed in the presence of a chaperone  Assessment and Plan:  Ann Daugherty is a 53 y.o.   - Diagnosis: history of metastatic ER+/PR+/HER2- breast cancer, history of tamoxifen  use - Planned surgery: TLH/BSO/cystoscopy - Risks of surgery include but are not limited to:  Bleeding - Can bleed enough to need transfusion or need for additional  surgeries I.e. conversation to open surgery.  Infection - The vagina can develop cuff infection Injury to surrounding organs/tissues (i.e. bowel/bladder/ureters) - discussed how each of these is managed I.e. bladder injury requires catheter for 10-14 days after surgery Need for additional procedures - Would be specific to a complication or injury Wound complications - No specific wound unless converted to open surgery or laparoscopic Hospital re-admission - In the event of a delayed complication being recognized I.e. ureteral injury discussed Conversion to open surgery - reviewed some complications may necessitate or it may be the only way to complete the surgery.   VTE - Discussed risk of blood clots following delivery - We discussed alternatives to hysterectomy including birth control options that will work better to regulate bleeding, I.e. IUD, endometrial ablation. She desires definitive management.  - We discussed postop restrictions, precautions and expectations - Preop testing needed: per anesthesia - All questions answered - STOP tamoxifen  1 week prior to surgery, will transition to anastrozole post operatively per med onc - Pt requests date in July, she will be 6 weeks s/p adjuvant RT by 7/29   Future Appointments  Date Time Provider Department Center  05/22/2024  9:20 AM Izell Domino, MD [facility] None  06/09/2024  9:00 AM [facility] None  06/16/2024  9:00 AM [name], MD [facility] None  07/11/2024 10:00 AM Causey, Morna Pickle, NP [facility] None   Kieth JAYSON Carolin, MD

## 2024-04-21 NOTE — Progress Notes (Signed)
 Pt is new to office to discuss possible hysterectomy. Pt states that her oncologist recommend surgery due to Estrogen positive CA.

## 2024-04-22 ENCOUNTER — Other Ambulatory Visit: Payer: Self-pay | Admitting: Adult Health

## 2024-04-22 ENCOUNTER — Other Ambulatory Visit (HOSPITAL_COMMUNITY): Payer: Self-pay

## 2024-04-22 MED ORDER — CITALOPRAM HYDROBROMIDE 40 MG PO TABS
40.0000 mg | ORAL_TABLET | Freq: Every day | ORAL | 1 refills | Status: DC
  Filled 2024-04-22 – 2024-05-08 (×2): qty 90, 90d supply, fill #0
  Filled 2024-08-10: qty 90, 90d supply, fill #1

## 2024-04-23 ENCOUNTER — Ambulatory Visit: Payer: Self-pay | Admitting: Obstetrics and Gynecology

## 2024-04-23 LAB — CYTOLOGY - PAP
Comment: NEGATIVE
Comment: NEGATIVE
Comment: NEGATIVE
Diagnosis: UNDETERMINED — AB
HPV 16: NEGATIVE
HPV 18 / 45: POSITIVE — AB
High risk HPV: POSITIVE — AB

## 2024-04-28 ENCOUNTER — Encounter: Payer: Self-pay | Admitting: Obstetrics and Gynecology

## 2024-04-30 ENCOUNTER — Telehealth: Payer: Self-pay

## 2024-04-30 NOTE — Telephone Encounter (Signed)
 Patient agreed to have surgery scheduled for 06/03/24 @[facility] w/ Dr. Erik at 1:30 pm. I provided pre-op instructions and surgery details over the phone.

## 2024-05-06 ENCOUNTER — Other Ambulatory Visit (HOSPITAL_COMMUNITY): Payer: Self-pay

## 2024-05-07 ENCOUNTER — Other Ambulatory Visit: Payer: Self-pay | Admitting: Obstetrics and Gynecology

## 2024-05-07 DIAGNOSIS — Z170 Estrogen receptor positive status [ER+]: Secondary | ICD-10-CM

## 2024-05-07 DIAGNOSIS — C50212 Malignant neoplasm of upper-inner quadrant of left female breast: Secondary | ICD-10-CM

## 2024-05-08 ENCOUNTER — Other Ambulatory Visit: Payer: Self-pay | Admitting: *Deleted

## 2024-05-08 ENCOUNTER — Other Ambulatory Visit: Payer: Self-pay

## 2024-05-08 ENCOUNTER — Other Ambulatory Visit (HOSPITAL_COMMUNITY): Payer: Self-pay

## 2024-05-08 ENCOUNTER — Other Ambulatory Visit: Payer: Self-pay | Admitting: Hematology and Oncology

## 2024-05-08 DIAGNOSIS — Z170 Estrogen receptor positive status [ER+]: Secondary | ICD-10-CM

## 2024-05-08 MED ORDER — TAMOXIFEN CITRATE 20 MG PO TABS
20.0000 mg | ORAL_TABLET | Freq: Every day | ORAL | 0 refills | Status: DC
  Filled 2024-05-08: qty 30, 30d supply, fill #0

## 2024-05-16 DIAGNOSIS — Z809 Family history of malignant neoplasm, unspecified: Secondary | ICD-10-CM | POA: Diagnosis not present

## 2024-05-16 DIAGNOSIS — Z170 Estrogen receptor positive status [ER+]: Secondary | ICD-10-CM | POA: Diagnosis not present

## 2024-05-16 DIAGNOSIS — C50112 Malignant neoplasm of central portion of left female breast: Secondary | ICD-10-CM | POA: Diagnosis not present

## 2024-05-16 DIAGNOSIS — C787 Secondary malignant neoplasm of liver and intrahepatic bile duct: Secondary | ICD-10-CM | POA: Diagnosis not present

## 2024-05-16 DIAGNOSIS — M25611 Stiffness of right shoulder, not elsewhere classified: Secondary | ICD-10-CM | POA: Diagnosis not present

## 2024-05-17 DIAGNOSIS — Z419 Encounter for procedure for purposes other than remedying health state, unspecified: Secondary | ICD-10-CM | POA: Diagnosis not present

## 2024-05-19 NOTE — Therapy (Incomplete)
 OUTPATIENT PHYSICAL THERAPY  UPPER EXTREMITY ONCOLOGY EVALUATION  Patient Name: Ann Daugherty MRN: [id number] DOB:08/31/1971, 53 y.o., female Today's Date: 05/19/2024  END OF SESSION:   Past Medical History:  Diagnosis Date   ADD (attention deficit disorder) 2015   Does not take any medication   Anxiety    Cancer (HCC)    breast   Depression    GERD (gastroesophageal reflux disease)    History of chicken pox    PONV (postoperative nausea and vomiting)    Past Surgical History:  Procedure Laterality Date   BACK SURGERY     BREAST BIOPSY Left 07/19/2023   US  LT BREAST BX W LOC DEV EA ADD LESION IMG BX SPEC US  GUIDE 07/19/2023 [facility]   BREAST BIOPSY Left 07/19/2023   US  LT BREAST BX W LOC DEV 1ST LESION IMG BX SPEC US  GUIDE 07/19/2023 [facility]   BREAST BIOPSY Right 08/09/2023   US  RT BREAST BX W LOC DEV 1ST LESION IMG BX SPEC US  GUIDE 08/09/2023 [facility]   BREAST BIOPSY Right 08/09/2023   US  RT BREAST BX W LOC DEV EA ADD LESION IMG BX SPEC US  GUIDE 08/09/2023 [facility]   IR GUIDED LIVER TUMOR ABLATION RFA PERC     IR RADIOLOGIST EVAL & MGMT  01/08/2024   IR RADIOLOGIST EVAL & MGMT  01/31/2024   IR RADIOLOGIST EVAL & MGMT  04/17/2024   LAMINECTOMY     2007, 2011   PORTACATH PLACEMENT Right 08/14/2023   Procedure: INSERTION PORT-A-CATH WITH ULTRASOUND GUIDEANCE;  Surgeon: Aron Shoulders, MD;  Location: [facility] OR;  Service: General;  Laterality: Right;   SIMPLE MASTECTOMY WITH AXILLARY SENTINEL NODE BIOPSY Bilateral 01/24/2024   Procedure: BILATERAL MASTECTOMY;  Surgeon: Aron Shoulders, MD;  Location: [facility];  Service: General;  Laterality: Bilateral;  PEC BLOCK   VAGINAL DELIVERY  2004, 2006   Patient Active Problem List   Diagnosis Date Noted   Primary cancer of upper inner quadrant of left breast (HCC) 01/24/2024   Port-A-Cath in place 08/15/2023   Malignant neoplasm of upper-inner quadrant of left female breast (HCC)  07/20/2023   Depression, major, single episode, mild (HCC) 04/05/2021   Tobacco abuse 01/30/2018   Anxiety 01/30/2018   Attention deficit disorder 01/30/2018   Vitamin D  deficiency 01/30/2018       REFERRING PROVIDER: Dr. Shoulders Aron,  REFERRING DIAG: s/p Bilateral Mastectomies  THERAPY DIAG:  No diagnosis found.  ONSET DATE: 01/24/2024  Rationale for Evaluation and Treatment: Rehabilitation  SUBJECTIVE:  SUBJECTIVE STATEMENT: ***  PERTINENT HISTORY:   Patient presented with new diagnosis of left breast cancer 07/2023 Core needle biopsies were performed. The 5 o'clock mass had grade 3 invasive ductal carcinoma that is ER/PR positive, her 2 negative, and Ki 67 60%. The 10:30 mass was grade 2 invasive ductal carcinoma, Er/PR +, her 2 negative and Ki 67 30%. Pt had neoadjuvant chemotherapy during which she had about 30# weight loss.  She metastasis to liver and underwent a liver ablation 01/16/2024. Pt had bilataeral mastectomy on 01/24/24 with no lymph nodes removed.  She is s/p start radiation .      PAIN:  Are you having pain? {yes/no:20286} NPRS scale: ***/10 Pain location: *** Pain orientation: {Pain Orientation:25161}  PAIN TYPE: {type:313116} Pain description: {PAIN DESCRIPTION:21022940}  Aggravating factors: *** Relieving factors: ***  PRECAUTIONS: {Therapy precautions:24002}  RED FLAGS: {PT Red Flags:29287}   WEIGHT BEARING RESTRICTIONS: No  FALLS:  Has patient fallen in last 6 months? {fallsyesno:27318}  LIVING ENVIRONMENT: Lives with: lives alone Lives in: House/apartment  OCCUPATION: Nurse;not currently working?  LEISURE: walks daily  HAND DOMINANCE: right   PRIOR LEVEL OF FUNCTION: Independent  PATIENT GOALS: ***   OBJECTIVE: Note: Objective measures were  completed at Evaluation unless otherwise noted.  COGNITION: Overall cognitive status: Within functional limits for tasks assessed   PALPATION: ***  OBSERVATIONS / OTHER ASSESSMENTS: ***  SENSATION: Light touch: Deficits ***   POSTURE:   forward head and shoulders . Left ribs rotated forward and prominent in standing. Pt had decreased scapular stabilization on left with scapula moving away from midline with active left shoulder elevation  UPPER EXTREMITY AROM/PROM:  A/PROM RIGHT   eval   Shoulder extension   Shoulder flexion   Shoulder abduction   Shoulder internal rotation   Shoulder external rotation     (Blank rows = not tested)  A/PROM LEFT   eval  Shoulder extension   Shoulder flexion   Shoulder abduction   Shoulder internal rotation   Shoulder external rotation     (Blank rows = not tested)  CERVICAL AROM: All within normal limits:    Percent limited  Flexion   Extension   Right lateral flexion   Left lateral flexion   Right rotation   Left rotation     UPPER EXTREMITY STRENGTH:   LYMPHEDEMA ASSESSMENTS:   SURGERY TYPE/DATE: ***  NUMBER OF LYMPH NODES REMOVED: 0  CHEMOTHERAPY: YES  RADIATION:YES  HORMONE TREATMENT: YES  INFECTIONS: NO   LYMPHEDEMA ASSESSMENTS:   LANDMARK RIGHT  eval  At axilla    15 cm proximal to olecranon process   10 cm proximal to olecranon process   Olecranon process   15 cm proximal to ulnar styloid process   10 cm proximal to ulnar styloid process   Just proximal to ulnar styloid process   Across hand at thumb web space   At base of 2nd digit   (Blank rows = not tested)  LANDMARK LEFT  eval  At axilla    15 cm proximal to olecranon process   10 cm proximal to olecranon process   Olecranon process   15 cm proximal to ulnar styloid process   10 cm proximal to ulnar styloid process   Just proximal to ulnar styloid process   Across hand at thumb web space   At base of 2nd digit   (Blank rows = not  tested)   FUNCTIONAL TESTS:  {Functional tests:24029}  GAIT: Distance walked: *** Assistive device utilized: {  Assistive devices:23999} Level of assistance: {Levels of assistance:24026} Comments: ***  L-DEX LYMPHEDEMA SCREENING: The patient was assessed using the L-Dex machine today to produce a lymphedema index baseline score. The patient will be reassessed on a regular basis (typically every 3 months) to obtain new L-Dex scores. If the score is > 6.5 points away from his/her baseline score indicating onset of subclinical lymphedema, it will be recommended to wear a compression garment for 4 weeks, 12 hours per day and then be reassessed. If the score continues to be > 6.5 points from baseline at reassessment, we will initiate lymphedema treatment. Assessing in this manner has a 95% rate of preventing clinically significant lymphedema.  QUICK DASH SURVEY: ***                                                                                                                            TREATMENT DATE: ***    PATIENT EDUCATION:  Education details: *** Person educated: {Person educated:25204} Education method: {Education Method:25205} Education comprehension: {Education Comprehension:25206}  HOME EXERCISE PROGRAM: ***  ASSESSMENT:  CLINICAL IMPRESSION: Patient is a 53 y.o. female who was seen today for physical therapy evaluation and treatment for complaints of right axillary cording and limitations in right shoulder ROM. Physical therapy had previously been discontinued due to skin issues with radiation.    OBJECTIVE IMPAIRMENTS: . decreased endurance, decreased ROM, decreased strength, increased edema, increased fascial restrictions, impaired perceived functional ability, impaired UE functional use, postural dysfunction, and pain.   ACTIVITY LIMITATIONS: carrying, lifting, reach over head, hygiene/grooming, and endurance   PARTICIPATION LIMITATIONS:  {participationrestrictions:25113}  PERSONAL FACTORS:  3+ comorbidities: past chemo, liver mets and monitoring,  radiation are also affecting patient's functional outcome.   REHAB POTENTIAL: Good  CLINICAL DECISION MAKING: Evolving/moderate complexity  EVALUATION COMPLEXITY: Moderate  GOALS: Goals reviewed with patient? Yes  SHORT TERM GOALS: Target date: ***  Pt will be independent in a home exercise program for shoulder ROM  Baseline: Goal status: INITIAL  2.  Pt will decrease Quick Dash score to < 10  Baseline:   Goal status: INITIAL  3.  Pt will be independent in a strengthening exercises program that she can do at home Baseline:  Goal status: INITIAL  4.  Pt will have shoulder flexion Baseline:  Goal status: INITIAL  5.  *** Baseline:  Goal status: INITIAL  6.  *** Baseline:  Goal status: INITIAL  LONG TERM GOALS: Target date: ***  *** Baseline:  Goal status: INITIAL  2.  *** Baseline:  Goal status: INITIAL  3.  *** Baseline:  Goal status: INITIAL  4.  *** Baseline:  Goal status: INITIAL  5.  *** Baseline:  Goal status: INITIAL  6.  *** Baseline:  Goal status: INITIAL  PLAN:  PT FREQUENCY: {rehab frequency:25116}  PT DURATION: {rehab duration:25117}  PLANNED INTERVENTIONS: {rehab planned interventions:25118::Patient/Family education,Balance training,Joint mobilization,Therapeutic exercises,Therapeutic activity,Neuromuscular re-education,Gait training,Self Care}  PLAN FOR NEXT SESSION: ***  Grayce JINNY Sheldon,  PT 05/19/2024, 6:23 PM

## 2024-05-20 ENCOUNTER — Other Ambulatory Visit (HOSPITAL_COMMUNITY)
Admission: RE | Admit: 2024-05-20 | Discharge: 2024-05-20 | Disposition: A | Discharge status: Home / Self Care | Source: Ambulatory Visit | Attending: Obstetrics and Gynecology | Admitting: Obstetrics and Gynecology

## 2024-05-20 ENCOUNTER — Ambulatory Visit: Admitting: Obstetrics and Gynecology

## 2024-05-20 ENCOUNTER — Ambulatory Visit: Attending: General Surgery

## 2024-05-20 VITALS — BP 107/66 | HR 81 | Wt 134.4 lb

## 2024-05-20 DIAGNOSIS — R87810 Cervical high risk human papillomavirus (HPV) DNA test positive: Secondary | ICD-10-CM | POA: Insufficient documentation

## 2024-05-20 DIAGNOSIS — Z3202 Encounter for pregnancy test, result negative: Secondary | ICD-10-CM | POA: Diagnosis not present

## 2024-05-20 DIAGNOSIS — R87610 Atypical squamous cells of undetermined significance on cytologic smear of cervix (ASC-US): Secondary | ICD-10-CM

## 2024-05-20 DIAGNOSIS — N870 Mild cervical dysplasia: Secondary | ICD-10-CM | POA: Diagnosis not present

## 2024-05-20 DIAGNOSIS — R87612 Low grade squamous intraepithelial lesion on cytologic smear of cervix (LGSIL): Secondary | ICD-10-CM

## 2024-05-20 HISTORY — DX: Low grade squamous intraepithelial lesion on cytologic smear of cervix (LGSIL): R87.612

## 2024-05-20 LAB — POCT URINE PREGNANCY: Preg Test, Ur: NEGATIVE

## 2024-05-20 NOTE — Patient Instructions (Signed)
 It is normal to have cramping and bleeding for the next 2-3 days. You should feel better every day.   Please call us  if you have any severe pain, bleeding that soaks more than 1 pad in a hour, have fevers, or feel like you're going to pass out.   You can take tylenol  and ibuprofen  as needed for pain.

## 2024-05-20 NOTE — Progress Notes (Signed)
 Pt presents for colposcopy. Upt negative

## 2024-05-20 NOTE — Progress Notes (Signed)
    GYNECOLOGY OFFICE COLPOSCOPY PROCEDURE NOTE  53 y.o. G4P0 here for colposcopy for ASCUS with POSITIVE high risk HPV pap smear on 04/21/24.   Pregnancy test:  negative Gardasil: n/a - age > 50  Informed consent and review of risks, benefit and alternatives performed. Written consent given.   Speculum inserted into patient's vagina assuring full view of cervix and vaginal walls. 3 swabs of vinegar solution applied to the cervix and vaginal walls and colposcope was used to observe both the cervix and vaginal walls.   Colposcopy adequate? Yes Thin AWE within the canal from 6-11 o;clock; 2 corresponding biopsies obtained at 7 and 11.  ECC collected. Predict CIN I-II All specimens were labeled and sent to pathology.  Speculum  Pt tolerated well with minimal pain and bleeding.   Patient was given post procedure instructions.  Will follow up pathology and manage accordingly; patient will be contacted with results and recommendations.  Routine preventative health maintenance measures emphasized.  Kieth Carolin, MD Obstetrician & Gynecologist, [facility] for [employer], [facility]

## 2024-05-21 ENCOUNTER — Other Ambulatory Visit: Payer: Self-pay | Admitting: *Deleted

## 2024-05-21 DIAGNOSIS — C50212 Malignant neoplasm of upper-inner quadrant of left female breast: Secondary | ICD-10-CM

## 2024-05-21 NOTE — Progress Notes (Addendum)
  Ann Daugherty presents today for follow-up after completing radiation to her left breast on 04/16/2024 Patient is doing well and healing well post radiation Pain: Denies Skin: Skin is intact and healing well. She does use radiaplex on her treatment side and vitamin E oil as well on the opposite site ROM: None Lymphedema: None MedOnc F/U: Gudena, MD on 06/10/2024 Other issues of note:  None  Pt reports Yes No Comments  Tamoxifen  [x]  []    Letrozole []  [x]    Anastrazole []  [x]    Mammogram [x]  Date: 12/30/2023 []

## 2024-05-21 NOTE — Progress Notes (Signed)
 Received message from Pa team stating pt insurance has denied PET scan. Verbal orders received and placed per MD for pt to undergo CT CAP and Bone Scan. Orders placed, pt notified and verbalized understanding.

## 2024-05-22 ENCOUNTER — Ambulatory Visit
Admission: RE | Admit: 2024-05-22 | Discharge: 2024-05-22 | Disposition: A | Discharge status: Home / Self Care | Source: Ambulatory Visit | Attending: Radiation Oncology | Admitting: Radiation Oncology

## 2024-05-22 DIAGNOSIS — C50212 Malignant neoplasm of upper-inner quadrant of left female breast: Secondary | ICD-10-CM

## 2024-05-23 LAB — SURGICAL PATHOLOGY

## 2024-05-24 ENCOUNTER — Ambulatory Visit: Payer: Self-pay | Admitting: Obstetrics and Gynecology

## 2024-05-28 ENCOUNTER — Encounter (HOSPITAL_COMMUNITY): Payer: Self-pay | Admitting: *Deleted

## 2024-05-28 NOTE — Pre-Procedure Instructions (Signed)
 Surgical Instructions   Your procedure is scheduled on :  Tuesday,  05-27-2024. Report to [facility] Main Entrance A at 9:30  A.M., then check in with the Admitting office. Any questions or running late day of surgery: call [phone]  Questions prior to your surgery date: call [phone], Monday-Friday, 8am-4pm. If you experience any cold or flu symptoms such as cough, fever, chills, shortness of breath, etc. between now and your scheduled surgery, please notify your surgeon office.    Remember:  Do not eat any food after midnight the night before your surgery.  You may have clear liquids from midnight night before surgery until 8:30 AM.   Clear liquids allowed are:  Water Carbonated Beverages Clear Tea (may sweeten, no milk, honey, etc.) Black Coffee Only (may sweeten, NO MILK, CREAM OR POWDERED CREAMER of any kind) Sport drinks, like Gatorade.  NO clear liquids after 8:30 AM day of surgery.  This includes no water,  candy,  gum,  and  mints.    Take these medicines the morning of surgery with A SIPS OF WATER : Levothyroxine  (synthroid )   May take these medicines IF NEEDED: AirSupra  inhaler ~~Please bring your inhaler with you day of surgery   One week prior to surgery, STOP taking any Aspirin (unless otherwise instructed by your surgeon) Aleve, Naproxen, Ibuprofen , Motrin , Advil , Goody's, BC's, all herbal medications, fish oil, and non-prescription vitamins.                     Do NOT Smoke (Tobacco/Vaping) and Do Not drink alcohol for 24 hours prior to your procedure.  If you use a CPAP at night, you may bring your mask/headgear for your overnight stay.   You will be asked to remove any contacts, glasses, piercing's, hearing aid's, dentures/partials prior to surgery. Please bring cases for these items if needed.    Patients discharged the day of surgery will not be allowed to drive home, and someone needs to stay with them for 24 hours.  SURGICAL WAITING ROOM  VISITATION Patients may have no more than 2 support people in the waiting area - these visitors may rotate.   Pre-op nurse will coordinate an appropriate time for 1 ADULT support person, who may not rotate, to accompany patient in pre-op.  Children under the age of 13 must have an adult with them who is not the patient and must remain in the main waiting area with an adult.  If the patient needs to stay at the hospital during part of their recovery, the visitor guidelines for inpatient rooms apply.  Please refer to the [facility] website for the visitor guidelines for any additional information.   If you received a COVID test during your pre-op visit  it is requested that you wear a mask when out in public, stay away from anyone that may not be feeling well and notify your surgeon if you develop symptoms. If you have been in contact with anyone that has tested positive in the last 10 days please notify you surgeon.      Pre-operative CHG Bathing Instructions   You can play a key role in reducing the risk of infection after surgery. Your skin needs to be as free of germs as possible. You can reduce the number of germs on your skin by washing with CHG (chlorhexidine gluconate) soap before surgery. CHG is an antiseptic soap that kills germs and continues to kill germs even after washing.   DO NOT use if  you have an allergy to chlorhexidine/CHG or antibacterial soaps. If your skin becomes reddened or irritated, stop using the CHG and notify Pre-op nurse day of surgery              TAKE A SHOWER THE NIGHT BEFORE SURGERY AND THE DAY OF SURGERY    Please keep in mind the following:  DO NOT shave, including legs and underarms, 48 hours prior to surgery.   You may shave your face before/day of surgery.  Place clean sheets on your bed the night before surgery Use a clean washcloth (not used since being washed) for each shower. DO NOT sleep with pet's night before surgery.  CHG Shower  Instructions:  Wash your face and private area with normal soap. If you choose to wash your hair, wash first with your normal shampoo.  After you use shampoo/soap, rinse your hair and body thoroughly to remove shampoo/soap residue.  Turn the water OFF and apply half the bottle of CHG soap to a CLEAN washcloth.  Apply CHG soap ONLY FROM YOUR NECK DOWN TO YOUR TOES (washing for 3-5 minutes)  DO NOT use CHG soap on face, private areas, open wounds, or sores.  Pay special attention to the area where your surgery is being performed.  If you are having back surgery, having someone wash your back for you may be helpful. Wait 2 minutes after CHG soap is applied, then you may rinse off the CHG soap.  Pat dry with a clean towel  Put on clean pajamas    Additional instructions for the day of surgery: DO NOT APPLY any lotions,  powder,  oils,  deodorants (may use underarm deodorant) , cologne/  perfumes  or makeup Do not wear jewelry /  piercing's/  metal/  permanent jewelry must be removed prior to arrival day of surgery .  (No plastic piercing) Do not wear nail polish, gel polish, artificial nails, or any other type of covering on natural finger nails (toe nails are okay) Do not bring valuables to the hospital. [facility] is not responsible for valuables/personal belongings. Put on clean/comfortable clothes.  Please brush your teeth.  Ask your nurse before applying any prescription medications to the skin.

## 2024-05-28 NOTE — Progress Notes (Signed)
 Spoke w/ via phone for pre-op interview--- Lab needs dos----         Lab results------ COVID test -----patient states asymptomatic no test needed Arrive at ------- NPO after MN NO Solid Food.  Clear liquids from MN until--- Pre-Surgery Ensure or G2:  Med rec completed Medications to take morning of surgery ----- Diabetic medication -----  GLP1 agonist last dose: GLP1 instructions:  Patient instructed no nail polish to be worn day of surgery Patient instructed to bring photo id and insurance card day of surgery Patient aware to have Driver (ride ) / caregiver    for 24 hours after surgery -  Patient Special Instructions ----- Pre-Op special Instructions -----  Patient verbalized understanding of instructions that were given at this phone interview. Patient denies chest pain, sob, fever, cough at the interview.

## 2024-05-29 ENCOUNTER — Encounter (HOSPITAL_COMMUNITY): Payer: Self-pay

## 2024-05-29 ENCOUNTER — Other Ambulatory Visit

## 2024-05-29 ENCOUNTER — Ambulatory Visit (HOSPITAL_COMMUNITY)
Admission: RE | Admit: 2024-05-29 | Discharge: 2024-05-29 | Disposition: A | Discharge status: Home / Self Care | Source: Ambulatory Visit | Attending: Hematology and Oncology | Admitting: Hematology and Oncology

## 2024-05-29 ENCOUNTER — Ambulatory Visit (HOSPITAL_COMMUNITY)

## 2024-05-29 ENCOUNTER — Encounter (HOSPITAL_COMMUNITY): Payer: Self-pay | Admitting: Obstetrics and Gynecology

## 2024-05-29 ENCOUNTER — Inpatient Hospital Stay

## 2024-05-29 DIAGNOSIS — C50212 Malignant neoplasm of upper-inner quadrant of left female breast: Secondary | ICD-10-CM | POA: Insufficient documentation

## 2024-05-29 DIAGNOSIS — M19011 Primary osteoarthritis, right shoulder: Secondary | ICD-10-CM | POA: Diagnosis not present

## 2024-05-29 DIAGNOSIS — J9811 Atelectasis: Secondary | ICD-10-CM | POA: Diagnosis not present

## 2024-05-29 DIAGNOSIS — Z170 Estrogen receptor positive status [ER+]: Secondary | ICD-10-CM | POA: Diagnosis not present

## 2024-05-29 DIAGNOSIS — C50919 Malignant neoplasm of unspecified site of unspecified female breast: Secondary | ICD-10-CM | POA: Diagnosis not present

## 2024-05-29 DIAGNOSIS — K769 Liver disease, unspecified: Secondary | ICD-10-CM | POA: Diagnosis not present

## 2024-05-29 DIAGNOSIS — M19012 Primary osteoarthritis, left shoulder: Secondary | ICD-10-CM | POA: Diagnosis not present

## 2024-05-29 MED ORDER — TECHNETIUM TC 99M MEDRONATE IV KIT
20.0000 | PACK | Freq: Once | INTRAVENOUS | Status: AC | PRN
  Administered 2024-05-29: 19 via INTRAVENOUS

## 2024-05-29 MED ORDER — SODIUM CHLORIDE (PF) 0.9 % IJ SOLN
INTRAMUSCULAR | Status: AC
  Filled 2024-05-29: qty 50

## 2024-05-29 MED ORDER — IOHEXOL 300 MG/ML  SOLN
100.0000 mL | Freq: Once | INTRAMUSCULAR | Status: AC | PRN
  Administered 2024-05-29: 100 mL via INTRAVENOUS

## 2024-05-30 ENCOUNTER — Other Ambulatory Visit: Payer: Self-pay

## 2024-05-30 ENCOUNTER — Encounter (HOSPITAL_COMMUNITY)
Admission: RE | Admit: 2024-05-30 | Discharge: 2024-05-30 | Disposition: A | Discharge status: Home / Self Care | Source: Ambulatory Visit | Attending: Obstetrics and Gynecology | Admitting: Obstetrics and Gynecology

## 2024-05-30 DIAGNOSIS — Z170 Estrogen receptor positive status [ER+]: Secondary | ICD-10-CM | POA: Diagnosis not present

## 2024-05-30 DIAGNOSIS — C50212 Malignant neoplasm of upper-inner quadrant of left female breast: Secondary | ICD-10-CM | POA: Diagnosis not present

## 2024-05-30 DIAGNOSIS — Z01812 Encounter for preprocedural laboratory examination: Secondary | ICD-10-CM | POA: Diagnosis not present

## 2024-05-30 DIAGNOSIS — Z01818 Encounter for other preprocedural examination: Secondary | ICD-10-CM | POA: Diagnosis present

## 2024-05-30 LAB — CBC
HCT: 38.5 % (ref 36.0–46.0)
Hemoglobin: 12.7 g/dL (ref 12.0–15.0)
MCH: 31.1 pg (ref 26.0–34.0)
MCHC: 33 g/dL (ref 30.0–36.0)
MCV: 94.4 fL (ref 80.0–100.0)
Platelets: 232 K/uL (ref 150–400)
RBC: 4.08 MIL/uL (ref 3.87–5.11)
RDW: 12.2 % (ref 11.5–15.5)
WBC: 4.1 K/uL (ref 4.0–10.5)
nRBC: 0 % (ref 0.0–0.2)

## 2024-05-30 LAB — TYPE AND SCREEN
ABO/RH(D): O POS
Antibody Screen: NEGATIVE

## 2024-06-03 ENCOUNTER — Ambulatory Visit (HOSPITAL_COMMUNITY)

## 2024-06-03 ENCOUNTER — Ambulatory Visit (HOSPITAL_COMMUNITY)
Admission: RE | Admit: 2024-06-03 | Discharge: 2024-06-03 | Disposition: A | Discharge status: Home / Self Care | Attending: Obstetrics and Gynecology | Admitting: Obstetrics and Gynecology

## 2024-06-03 ENCOUNTER — Encounter (HOSPITAL_COMMUNITY): Payer: Self-pay | Admitting: Obstetrics and Gynecology

## 2024-06-03 ENCOUNTER — Encounter (HOSPITAL_COMMUNITY)
Admission: RE | Disposition: A | Discharge status: Home / Self Care | Payer: Self-pay | Source: Home / Self Care | Attending: Obstetrics and Gynecology

## 2024-06-03 ENCOUNTER — Other Ambulatory Visit: Payer: Self-pay

## 2024-06-03 DIAGNOSIS — Z9221 Personal history of antineoplastic chemotherapy: Secondary | ICD-10-CM | POA: Diagnosis not present

## 2024-06-03 DIAGNOSIS — Z4002 Encounter for prophylactic removal of ovary: Secondary | ICD-10-CM

## 2024-06-03 DIAGNOSIS — Z1721 Progesterone receptor positive status: Secondary | ICD-10-CM | POA: Insufficient documentation

## 2024-06-03 DIAGNOSIS — D251 Intramural leiomyoma of uterus: Secondary | ICD-10-CM | POA: Insufficient documentation

## 2024-06-03 DIAGNOSIS — Z1732 Human epidermal growth factor receptor 2 negative status: Secondary | ICD-10-CM | POA: Insufficient documentation

## 2024-06-03 DIAGNOSIS — N736 Female pelvic peritoneal adhesions (postinfective): Secondary | ICD-10-CM | POA: Insufficient documentation

## 2024-06-03 DIAGNOSIS — Z923 Personal history of irradiation: Secondary | ICD-10-CM | POA: Insufficient documentation

## 2024-06-03 DIAGNOSIS — F418 Other specified anxiety disorders: Secondary | ICD-10-CM | POA: Diagnosis not present

## 2024-06-03 DIAGNOSIS — Z79810 Long term (current) use of selective estrogen receptor modulators (SERMs): Secondary | ICD-10-CM | POA: Insufficient documentation

## 2024-06-03 DIAGNOSIS — Z01818 Encounter for other preprocedural examination: Secondary | ICD-10-CM

## 2024-06-03 DIAGNOSIS — Z170 Estrogen receptor positive status [ER+]: Secondary | ICD-10-CM | POA: Insufficient documentation

## 2024-06-03 DIAGNOSIS — Z4003 Encounter for prophylactic removal of fallopian tube(s): Secondary | ICD-10-CM | POA: Diagnosis not present

## 2024-06-03 DIAGNOSIS — N871 Moderate cervical dysplasia: Secondary | ICD-10-CM | POA: Diagnosis not present

## 2024-06-03 DIAGNOSIS — N838 Other noninflammatory disorders of ovary, fallopian tube and broad ligament: Secondary | ICD-10-CM | POA: Diagnosis not present

## 2024-06-03 DIAGNOSIS — C50212 Malignant neoplasm of upper-inner quadrant of left female breast: Secondary | ICD-10-CM | POA: Insufficient documentation

## 2024-06-03 DIAGNOSIS — F17290 Nicotine dependence, other tobacco product, uncomplicated: Secondary | ICD-10-CM | POA: Insufficient documentation

## 2024-06-03 HISTORY — DX: Generalized anxiety disorder: F41.1

## 2024-06-03 HISTORY — PX: TOTAL LAPAROSCOPIC HYSTERECTOMY WITH BILATERAL SALPINGO OOPHORECTOMY: SHX6845

## 2024-06-03 HISTORY — PX: CYSTOSCOPY: SHX5120

## 2024-06-03 HISTORY — DX: Anemia, unspecified: D64.9

## 2024-06-03 LAB — POCT PREGNANCY, URINE: Preg Test, Ur: NEGATIVE

## 2024-06-03 SURGERY — HYSTERECTOMY, TOTAL, LAPAROSCOPIC, WITH BILATERAL SALPINGO-OOPHORECTOMY
Anesthesia: Regional | Site: Pelvis

## 2024-06-03 MED ORDER — CEFAZOLIN SODIUM-DEXTROSE 2-4 GM/100ML-% IV SOLN
2.0000 g | INTRAVENOUS | Status: AC
  Administered 2024-06-03: 2 g via INTRAVENOUS

## 2024-06-03 MED ORDER — FENTANYL CITRATE (PF) 250 MCG/5ML IJ SOLN
INTRAMUSCULAR | Status: AC
  Filled 2024-06-03: qty 5

## 2024-06-03 MED ORDER — FENTANYL CITRATE (PF) 250 MCG/5ML IJ SOLN
INTRAMUSCULAR | Status: DC | PRN
  Administered 2024-06-03: 100 ug via INTRAVENOUS
  Administered 2024-06-03: 50 ug via INTRAVENOUS
  Administered 2024-06-03: 100 ug via INTRAVENOUS

## 2024-06-03 MED ORDER — FENTANYL CITRATE (PF) 100 MCG/2ML IJ SOLN
25.0000 ug | INTRAMUSCULAR | Status: DC | PRN
  Administered 2024-06-03 (×2): 50 ug via INTRAVENOUS

## 2024-06-03 MED ORDER — APIXABAN 2.5 MG PO TABS
2.5000 mg | ORAL_TABLET | Freq: Two times a day (BID) | ORAL | 0 refills | Status: DC
Start: 2024-06-03 — End: 2024-06-19

## 2024-06-03 MED ORDER — OXYCODONE HCL 5 MG/5ML PO SOLN: 5.0000 mg | Freq: Once | ORAL | Status: DC | PRN

## 2024-06-03 MED ORDER — DEXAMETHASONE SODIUM PHOSPHATE 10 MG/ML IJ SOLN
INTRAMUSCULAR | Status: DC | PRN
  Administered 2024-06-03: 5 mg via INTRAVENOUS

## 2024-06-03 MED ORDER — CELECOXIB 200 MG PO CAPS
400.0000 mg | ORAL_CAPSULE | ORAL | Status: AC
  Administered 2024-06-03: 400 mg via ORAL

## 2024-06-03 MED ORDER — FENTANYL CITRATE (PF) 100 MCG/2ML IJ SOLN
INTRAMUSCULAR | Status: AC
Start: 2024-06-03 — End: 2024-06-03
  Filled 2024-06-03: qty 2

## 2024-06-03 MED ORDER — CHLORHEXIDINE GLUCONATE 0.12 % MT SOLN
OROMUCOSAL | Status: AC
  Filled 2024-06-03: qty 15

## 2024-06-03 MED ORDER — ROCURONIUM BROMIDE 10 MG/ML (PF) SYRINGE
PREFILLED_SYRINGE | INTRAVENOUS | Status: DC | PRN
  Administered 2024-06-03: 60 mg via INTRAVENOUS
  Administered 2024-06-03: 10 mg via INTRAVENOUS

## 2024-06-03 MED ORDER — ACETAMINOPHEN 10 MG/ML IV SOLN
INTRAVENOUS | Status: AC
Start: 2024-06-03 — End: 2024-06-03
  Filled 2024-06-03: qty 100

## 2024-06-03 MED ORDER — MIDAZOLAM HCL 2 MG/2ML IJ SOLN
INTRAMUSCULAR | Status: DC | PRN
  Administered 2024-06-03: 2 mg via INTRAVENOUS

## 2024-06-03 MED ORDER — KETOROLAC TROMETHAMINE 30 MG/ML IJ SOLN
INTRAMUSCULAR | Status: DC | PRN
  Administered 2024-06-03: 30 mg via INTRAVENOUS

## 2024-06-03 MED ORDER — ORAL CARE MOUTH RINSE: 15.0000 mL | Freq: Once | OROMUCOSAL | Status: AC

## 2024-06-03 MED ORDER — OXYCODONE HCL 5 MG PO TABS: 5.0000 mg | ORAL_TABLET | ORAL | 0 refills | Status: DC | PRN

## 2024-06-03 MED ORDER — LACTATED RINGERS IV SOLN: INTRAVENOUS | Status: DC

## 2024-06-03 MED ORDER — ACETAMINOPHEN 500 MG PO TABS
1000.0000 mg | ORAL_TABLET | ORAL | Status: AC
  Administered 2024-06-03: 1000 mg via ORAL

## 2024-06-03 MED ORDER — PROPOFOL 10 MG/ML IV BOLUS
INTRAVENOUS | Status: AC
  Filled 2024-06-03: qty 20

## 2024-06-03 MED ORDER — POVIDONE-IODINE 10 % EX SWAB
2.0000 | Freq: Once | CUTANEOUS | Status: AC
  Administered 2024-06-03: 2 via TOPICAL

## 2024-06-03 MED ORDER — HEMOSTATIC AGENTS (NO CHARGE) OPTIME
TOPICAL | Status: DC | PRN
  Administered 2024-06-03: 1

## 2024-06-03 MED ORDER — 0.9 % SODIUM CHLORIDE (POUR BTL) OPTIME
TOPICAL | Status: DC | PRN
  Administered 2024-06-03: 1000 mL

## 2024-06-03 MED ORDER — CEFAZOLIN SODIUM-DEXTROSE 2-4 GM/100ML-% IV SOLN
INTRAVENOUS | Status: AC
  Filled 2024-06-03: qty 100

## 2024-06-03 MED ORDER — SENNA 8.6 MG PO TABS: 2.0000 | ORAL_TABLET | Freq: Every evening | ORAL | 0 refills | Status: DC | PRN

## 2024-06-03 MED ORDER — IBUPROFEN 800 MG PO TABS: 800.0000 mg | ORAL_TABLET | Freq: Three times a day (TID) | ORAL | 0 refills | Status: DC | PRN

## 2024-06-03 MED ORDER — ACETAMINOPHEN 10 MG/ML IV SOLN
1000.0000 mg | Freq: Once | INTRAVENOUS | Status: DC | PRN
  Administered 2024-06-03: 1000 mg via INTRAVENOUS

## 2024-06-03 MED ORDER — ONDANSETRON HCL 4 MG/2ML IJ SOLN
INTRAMUSCULAR | Status: DC | PRN
  Administered 2024-06-03: 4 mg via INTRAVENOUS

## 2024-06-03 MED ORDER — ACETAMINOPHEN 500 MG PO TABS
1000.0000 mg | ORAL_TABLET | Freq: Four times a day (QID) | ORAL | 0 refills | Status: AC
Start: 2024-06-03 — End: ?

## 2024-06-03 MED ORDER — SUGAMMADEX SODIUM 200 MG/2ML IV SOLN
INTRAVENOUS | Status: DC | PRN
  Administered 2024-06-03: 200 mg via INTRAVENOUS

## 2024-06-03 MED ORDER — PROPOFOL 10 MG/ML IV BOLUS
INTRAVENOUS | Status: DC | PRN
  Administered 2024-06-03: 100 mg via INTRAVENOUS
  Administered 2024-06-03: 100 ug/kg/min via INTRAVENOUS

## 2024-06-03 MED ORDER — SODIUM CHLORIDE 0.9 % IR SOLN
Status: DC | PRN
  Administered 2024-06-03: 1000 mL
  Administered 2024-06-03: 1000 mL via INTRAVESICAL

## 2024-06-03 MED ORDER — ACETAMINOPHEN 500 MG PO TABS
ORAL_TABLET | ORAL | Status: DC
Start: 2024-06-03 — End: 2024-06-03
  Filled 2024-06-03: qty 2

## 2024-06-03 MED ORDER — OXYCODONE HCL 5 MG PO TABS: 5.0000 mg | ORAL_TABLET | Freq: Once | ORAL | Status: DC | PRN

## 2024-06-03 MED ORDER — PHENYLEPHRINE 80 MCG/ML (10ML) SYRINGE FOR IV PUSH (FOR BLOOD PRESSURE SUPPORT)
PREFILLED_SYRINGE | INTRAVENOUS | Status: DC | PRN
  Administered 2024-06-03: 160 ug via INTRAVENOUS

## 2024-06-03 MED ORDER — DROPERIDOL 2.5 MG/ML IJ SOLN: 0.6250 mg | Freq: Once | INTRAMUSCULAR | Status: DC | PRN

## 2024-06-03 MED ORDER — CELECOXIB 200 MG PO CAPS
ORAL_CAPSULE | ORAL | Status: DC
Start: 2024-06-03 — End: 2024-06-03
  Filled 2024-06-03: qty 2

## 2024-06-03 MED ORDER — PHENYLEPHRINE HCL-NACL 20-0.9 MG/250ML-% IV SOLN
INTRAVENOUS | Status: DC | PRN
  Administered 2024-06-03: 20 ug/min via INTRAVENOUS

## 2024-06-03 MED ORDER — MIDAZOLAM HCL 2 MG/2ML IJ SOLN
INTRAMUSCULAR | Status: AC
  Filled 2024-06-03: qty 2

## 2024-06-03 MED ORDER — CHLORHEXIDINE GLUCONATE 0.12 % MT SOLN
15.0000 mL | Freq: Once | OROMUCOSAL | Status: AC
  Administered 2024-06-03: 15 mL via OROMUCOSAL

## 2024-06-03 SURGICAL SUPPLY — 51 items
APPLICATOR ARISTA FLEXITIP XL (MISCELLANEOUS) IMPLANT
BLADE SURG 10 STRL SS (BLADE) IMPLANT
COVER MAYO STAND STRL (DRAPES) ×3 IMPLANT
COVER SURGICAL LIGHT HANDLE (MISCELLANEOUS) IMPLANT
DEFOGGER SCOPE WARM SEASHARP (MISCELLANEOUS) ×3 IMPLANT
DERMABOND ADVANCED .7 DNX12 (GAUZE/BANDAGES/DRESSINGS) ×3 IMPLANT
DRAPE SURG IRRIG POUCH 19X23 (DRAPES) ×3 IMPLANT
DURAPREP 26ML APPLICATOR (WOUND CARE) ×3 IMPLANT
GLOVE BIO SURGEON STRL SZ7 (GLOVE) ×3 IMPLANT
GLOVE BIOGEL PI IND STRL 7.0 (GLOVE) ×12 IMPLANT
GOWN STRL REUS W/ TWL XL LVL3 (GOWN DISPOSABLE) ×6 IMPLANT
HEMOSTAT ARISTA ABSORB 3G PWDR (HEMOSTASIS) IMPLANT
HIBICLENS CHG 4% 4OZ BTL (MISCELLANEOUS) ×6 IMPLANT
IRRIGATION SUCT STRKRFLW 2 WTP (MISCELLANEOUS) ×3 IMPLANT
KIT PINK PAD W/HEAD ARM REST (MISCELLANEOUS) ×3 IMPLANT
KIT TURNOVER KIT B (KITS) ×3 IMPLANT
LHOOK LAP DISP 36CM (ELECTROSURGICAL) ×3 IMPLANT
LIGASURE LAP MARYLAND 5MM 37CM (ELECTROSURGICAL) IMPLANT
NDL INSUFFLATION 14GA 120MM (NEEDLE) IMPLANT
NEEDLE INSUFFLATION 14GA 120MM (NEEDLE) IMPLANT
NS IRRIG 1000ML POUR BTL (IV SOLUTION) ×3 IMPLANT
OCCLUDER COLPOPNEUMO (BALLOONS) ×3 IMPLANT
PACK LAPAROSCOPY BASIN (CUSTOM PROCEDURE TRAY) ×3 IMPLANT
PACK ROBOTIC GOWN (GOWN DISPOSABLE) ×3 IMPLANT
PAD POSITIONING PINK XL (MISCELLANEOUS) ×3 IMPLANT
PENCIL BUTTON HOLSTER BLD 10FT (ELECTRODE) ×3 IMPLANT
POUCH LAPAROSCOPIC INSTRUMENT (MISCELLANEOUS) ×3 IMPLANT
SCISSORS LAP 5X35 DISP (ENDOMECHANICALS) IMPLANT
SEALER TISSUE G2 STRG ARTC 35C (ENDOMECHANICALS) IMPLANT
SEALER TISSUE X1 STRG JAW 37MM (SHEATH) IMPLANT
SET CYSTO W/LG BORE CLAMP LF (SET/KITS/TRAYS/PACK) ×3 IMPLANT
SET TUBE SMOKE EVAC HIGH FLOW (TUBING) ×3 IMPLANT
SLEEVE ADV FIXATION 5X100MM (TROCAR) ×6 IMPLANT
SUT MNCRL AB 4-0 PS2 18 (SUTURE) IMPLANT
SUT PDS AB 2-0 CT2 27 (SUTURE) ×6 IMPLANT
SUT STRATA PDS 2-0 23 CT-1 (SUTURE) ×3 IMPLANT
SUT VIC AB 0 CT1 27XBRD ANBCTR (SUTURE) ×3 IMPLANT
SUT VIC AB 4-0 PS2 18 (SUTURE) ×3 IMPLANT
SUT VICRYL 0 UR6 27IN ABS (SUTURE) ×6 IMPLANT
SUT VLOC 180 0 9IN GS21 (SUTURE) ×3 IMPLANT
SYR 10ML LL (SYRINGE) ×3 IMPLANT
SYR 50ML LL SCALE MARK (SYRINGE) ×3 IMPLANT
TIP UTERINE 6.7X10CM GRN DISP (MISCELLANEOUS) IMPLANT
TIP UTERINE 6.7X6CM WHT DISP (MISCELLANEOUS) IMPLANT
TIP UTERINE 6.7X8CM BLUE DISP (MISCELLANEOUS) IMPLANT
TOWEL GREEN STERILE FF (TOWEL DISPOSABLE) ×6 IMPLANT
TRAY FOLEY W/BAG SLVR 14FR (SET/KITS/TRAYS/PACK) ×3 IMPLANT
TROCAR ADV FIXATION 11X100MM (TROCAR) ×3 IMPLANT
TROCAR ADV FIXATION 5X100MM (TROCAR) ×3 IMPLANT
TROCAR BALLN 12MMX100 BLUNT (TROCAR) IMPLANT
UNDERPAD 30X36 HEAVY ABSORB (UNDERPADS AND DIAPERS) ×3 IMPLANT

## 2024-06-03 NOTE — H&P (Signed)
 PRE OPERATIVE HISTORY AND PHYSICAL   Subjective:  Ann Daugherty is a 53 y.o. G4P0 with stage IV ER/PR+, HER2- breast cancer (mets to liver) presenting for scheduled TLH/BSO/cysto  Initial diagnosis 07/2023. She underwent neoadjuvant chemotherapy 08/15/23-12/27/23 followed by bilateral mastectomies 01/24/24. She then completed adjuvant RT to the chest wall from 03/04/24-04/16/24. She also completed microwave ablation of her larger liver lesion. She is currently on tamoxifen  and her med onc is Dr. Odean.    She was recommended hysterectomy/BSO after completion of her radiation therapy to allow for treatment with Verzenio & anastrozole as well as reduce uterine cancer risk given her history of tamoxifen  use. She was perimenopausal prior to her diagnosis and reports that her periods stopped once she started tamoxifen .   She has never had abdominal surgery. SVD x2. She vapes nicotine regularly.  Past Medical History:  Diagnosis Date   ADD (attention deficit disorder)    Anemia    Breast cancer metastasized to liver, unspecified laterality (HCC) 08/2023   01-16-2024 s/p microwave ablation large liver lesion w/ residual small lesion   Depression    GAD (generalized anxiety disorder)    GERD (gastroesophageal reflux disease)    History of antineoplastic chemotherapy 08/2023   08-15-2023  to  20-20-2025   History of external beam radiation therapy 01/24/2024   left chest wall   s/p bilateral mastectomy   01-24-2024  to  04-16-2024   Malignant neoplasm of upper-inner quadrant of left breast in female, estrogen receptor positive (HCC) 07/2023   oncologist-- dr catrina gyn oncology--- dr Kemuel Buchmann/ surgeon -- dr aron;  dx 07/2023  HGDCIS/ ICDG2; liver bx confirmed mets ; Stage IV;  completed chemo 02/ 2025; 03/ 2025 s/p bilateral mastectomy; completed IMRT 04-16-2024; scheduled for Hystectomy w/ BSO 06-03-2024   Pap smear abnormality of cervix with LGSIL 05/20/2024   w/ + HPV   PONV (postoperative  nausea and vomiting)    Port-A-Cath in place 08/14/2023   Past Surgical History:  Procedure Laterality Date   BREAST BIOPSY Left 07/19/2023   US  LT BREAST BX W LOC DEV EA ADD LESION IMG BX SPEC US  GUIDE 07/19/2023 [facility]   BREAST BIOPSY Left 07/19/2023   US  LT BREAST BX W LOC DEV 1ST LESION IMG BX SPEC US  GUIDE 07/19/2023 [facility]   BREAST BIOPSY Right 08/09/2023   US  RT BREAST BX W LOC DEV 1ST LESION IMG BX SPEC US  GUIDE 08/09/2023 [facility]   BREAST BIOPSY Right 08/09/2023   US  RT BREAST BX W LOC DEV EA ADD LESION IMG BX SPEC US  GUIDE 08/09/2023 [facility]   DILATION AND CURETTAGE OF UTERUS  1990   IR GUIDED LIVER TUMOR ABLATION RFA PERC     IR RADIOLOGIST EVAL & MGMT  01/08/2024   IR RADIOLOGIST EVAL & MGMT  01/31/2024   IR RADIOLOGIST EVAL & MGMT  04/17/2024   LAMINECTOMY AND MICRODISCECTOMY LUMBAR SPINE  2011   by dr onetha;   re-do L4--5   LUMBAR LAMINECTOMY/DECOMPRESSION WITH DISCECTOMY  09/17/2006   @[facility]  by dr onetha;   L4-5   PORTACATH PLACEMENT Right 08/14/2023   Procedure: INSERTION PORT-A-CATH WITH ULTRASOUND ANDREA;  Surgeon: aron Shoulders, MD;  Location: [facility];  Service: General;  Laterality: Right;   SIMPLE MASTECTOMY WITH AXILLARY SENTINEL NODE BIOPSY Bilateral 01/24/2024   Procedure: BILATERAL MASTECTOMY;  Surgeon: aron Shoulders, MD;  Location: [facility];  Service: General;  Laterality: Bilateral;  PEC BLOCK   No current facility-administered medications  on file prior to encounter.   Current Outpatient Medications on File Prior to Encounter  Medication Sig Dispense Refill   acetaminophen  (TYLENOL ) 500 MG tablet Take 2 tablets (1,000 mg total) by mouth every 6 (six) hours. 30 tablet 0   Cholecalciferol (VITAMIN D3) 5000 units CAPS Take 5,000 Units by mouth daily.     citalopram  (CELEXA ) 40 MG tablet Take 1 tablet (40 mg total) by mouth daily. (Patient taking differently: Take 40 mg by mouth daily.) 90 tablet 1    Cranberry 500 MG TABS Take 1 tablet by mouth 3 (three) times a week.     ibuprofen  (ADVIL ) 800 MG tablet Take 1 tablet (800 mg total) by mouth every 8 (eight) hours as needed. 60 tablet 2   lidocaine -prilocaine  (EMLA ) cream Apply to affected area once 30 g 3   LORazepam  (ATIVAN ) 0.5 MG tablet Take 1 tablet (0.5 mg total) by mouth 2 (two) times daily as needed for anxiety. (Patient taking differently: Take 0.5 mg by mouth 2 (two) times daily as needed for anxiety.) 30 tablet 1   gabapentin  (NEURONTIN ) 300 MG capsule Take 300 mg by mouth 2 (two) times daily as needed. (Patient not taking: Reported on 06/03/2024)     methocarbamol  (ROBAXIN ) 500 MG tablet Take 1 tablet (500 mg total) by mouth every 6 (six) hours as needed for muscle spasms. 30 tablet 2   No Known Allergies  Social History   Socioeconomic History   Marital status: Divorced    Spouse name: Not on file   Number of children: 2   Years of education: Not on file   Highest education level: Not on file  Occupational History   Not on file  Tobacco Use   Smoking status: Former    Current packs/day: 0.00    Average packs/day: 0.5 packs/day for 30.0 years (15.0 ttl pk-yrs)    Types: Cigarettes    Start date: 04/07/1991    Quit date: 04/06/2021    Years since quitting: 3.1   Smokeless tobacco: Never  Vaping Use   Vaping status: Every Day   Substances: Nicotine   Devices: jewel  (pt stated started in 2022)  Substance and Sexual Activity   Alcohol use: Not Currently   Drug use: Not Currently    Comment: 05-28-2024  CBD gummies , last time 2022   Sexual activity: Not Currently    Birth control/protection: None  Other Topics Concern   Not on file  Social History Narrative   2  sons: 54 y.o. and 51 y.o.    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   Social Drivers of Health   Financial Resource Strain: Not on file  Food Insecurity: Food Insecurity Present (02/19/2024)   Hunger Vital Sign    Worried About Running Out of Food in the Last  Year: Sometimes true    Ran Out of Food in the Last Year: Sometimes true  Transportation Needs: No Transportation Needs (02/19/2024)   PRAPARE - Administrator, Civil Service (Medical): No    Lack of Transportation (Non-Medical): No  Physical Activity: Not on file  Stress: Not on file  Social Connections: Not on file  Intimate Partner Violence: Not At Risk (02/19/2024)   Humiliation, Afraid, Rape, and Kick questionnaire    Fear of Current or Ex-Partner: No    Emotionally Abused: No    Physically Abused: No    Sexually Abused: No   Objective:   Vitals:   05/28/24 1602 05/28/24 1630 06/03/24  1118  BP:   116/61  Pulse:   75  Resp:   17  Temp:   98.1 F (36.7 C)  TempSrc:   Oral  SpO2:   97%  Weight: 59 kg 59 kg 59 kg  Height: 5' 6 (1.676 m) 5' 6 (1.676 m) 5' 6 (1.676 m)   General:  Alert, oriented and cooperative. Patient is in no acute distress.  Skin: Skin is warm and dry. No rash noted.   Cardiovascular: Normal heart rate noted  Respiratory: Normal respiratory effort, no problems with respiration noted  Abdomen: Soft, non-tender, non-distended    Assessment and Plan:  Ann Daugherty is a 53 y.o. here for TLH/BSO/cysto  - Diagnosis: history of metastatic ER+/PR+/HER2- breast cancer, history of tamoxifen  use - Planned surgery: TLH/BSO/cystoscopy - Risks of surgery include but are not limited to:  Bleeding - Can bleed enough to need transfusion or need for additional surgeries I.e. conversation to open surgery.  Infection - The vagina can develop cuff infection Injury to surrounding organs/tissues (i.e. bowel/bladder/ureters) - discussed how each of these is managed I.e. bladder injury requires catheter for 10-14 days after surgery Need for additional procedures - Would be specific to a complication or injury Wound complications - No specific wound unless converted to open surgery or laparoscopic Hospital re-admission - In the event of a delayed complication  being recognized I.e. ureteral injury discussed Conversion to open surgery - reviewed some complications may necessitate or it may be the only way to complete the surgery.   VTE - Discussed risk of blood clots following delivery - We discussed alternatives to hysterectomy including birth control options that will work better to regulate bleeding, I.e. IUD, endometrial ablation. She desires definitive management.  - We discussed postop restrictions, precautions and expectations - To OR when ready, Ancef  ordered for pre op abx  Future Appointments  Date Time Provider Department Center  06/10/2024  3:15 PM [name], MD [facility] None  07/11/2024 10:00 AM Causey, Morna Pickle, NP [facility] None    Kieth JAYSON Carolin, MD

## 2024-06-03 NOTE — Anesthesia Postprocedure Evaluation (Signed)
 Anesthesia Post Note  Patient: Ann Daugherty  Procedure(s) Performed: HYSTERECTOMY, TOTAL, LAPAROSCOPIC, WITH BILATERAL SALPINGO-OOPHORECTOMY (Bilateral: Pelvis) CYSTOSCOPY (Bladder)     Patient location during evaluation: PACU Anesthesia Type: General Level of consciousness: awake and alert Pain management: pain level controlled Vital Signs Assessment: post-procedure vital signs reviewed and stable Respiratory status: spontaneous breathing, nonlabored ventilation, respiratory function stable and patient connected to nasal cannula oxygen Cardiovascular status: blood pressure returned to baseline and stable Postop Assessment: no apparent nausea or vomiting Anesthetic complications: no   There were no known notable events for this encounter.  Last Vitals:  Vitals:   06/03/24 1945 06/03/24 2000  BP: (!) 125/58 123/71  Pulse: 82 72  Resp: 19 (!) 27  Temp:  36.4 C  SpO2: 96% 93%    Last Pain:  Vitals:   06/03/24 1951  TempSrc:   PainSc: 4                  Garnette FORBES Skillern

## 2024-06-03 NOTE — Discharge Instructions (Signed)
 Post-surgical Instructions, Outpatient Surgery  You may expect to feel dizzy, weak, and drowsy for as long as 24 hours after receiving the medicine that made you sleep (anesthetic). For the first 24 hours after your surgery:   Do not drive a car, ride a bicycle, participate in physical activities, or take public transportation until you are done taking narcotic pain medicines Do not drink alcohol or take tranquilizers.  Do not take medicine that has not been prescribed by your physicians.  Do not sign important papers or make important decisions while on narcotic pain medicines.  Have a responsible person with you.   CARE OF INCISION There is dermabond on your incisions. Just let it loosen/flake on its own.  You may shower on the first day after your surgery.  Do not sit in a tub bath for 4 weeks. Avoid heavy lifting (more than 10 pounds/4.5 kilograms), pushing, or pulling.  Avoid activities that may risk injury to your incisions.   PAIN MANAGEMENT Ibuprofen  600-800mg .  (This is the same as 3-4 200mg  over the counter tablets of Motrin  or ibuprofen .)  Take this every 6-8 hours or as needed for cramping.   Acetaminophen  1000mg  (This is the same as 2-500mg  over the counter extra strength tylenol ). Take this every 6-8 hours for the first 3 days or as needed afterwards for pain Oxycodone  5mg  For more severe pain, take one or two tablets every four to six hours as needed for pain control.  (Remember that narcotic pain medications increase your risk of constipation.  If this becomes a problem, you may take an over the counter laxative like miralax.)  DO'S AND DON'T'S Do not take a tub bath for 4 weeks.  You may shower on the first day after your surgery Do not do any heavy lifting for four weeks.  This increases the chance of bleeding. Do move around as you feel able.  Stairs are fine.  You may begin to exercise again as you feel able.  Do not lift any weights for four weeks Do not put anything in  the vagina for twelve weeks--no tampons, intercourse, or douching.    REGULAR MEDIATIONS/VITAMINS: You may restart all of your regular medications as prescribed. Please resume your anastrozole 1 week post operatively You may restart all of your vitamins as you normally take them.    PLEASE CALL OR SEEK MEDICAL CARE IF: You have persistent nausea and vomiting.  You have trouble eating or drinking.  You have an oral temperature above 100.5.  You have constipation that is not helped by adjusting diet or increasing fluid intake. Pain medicines are a common cause of constipation.  You have heavy vaginal bleeding You have redness or drainage from your incision(s) or there is increasing pain or tenderness near or in the surgical site.

## 2024-06-03 NOTE — Anesthesia Preprocedure Evaluation (Addendum)
 Anesthesia Evaluation  Patient identified by MRN, date of birth, ID band Patient awake    Reviewed: Allergy & Precautions, NPO status , Patient's Chart, lab work & pertinent test results  History of Anesthesia Complications (+) PONV and history of anesthetic complications  Airway Mallampati: II  TM Distance: >3 FB Neck ROM: Full    Dental no notable dental hx. (+) Teeth Intact, Dental Advisory Given, Implants   Pulmonary Patient abstained from smoking., former smoker Quit smoking 2022, 15 pack year history    Pulmonary exam normal breath sounds clear to auscultation       Cardiovascular (-) hypertension(-) angina (-) Past MI negative cardio ROS Normal cardiovascular exam Rhythm:Regular Rate:Normal     Neuro/Psych neg Seizures PSYCHIATRIC DISORDERS Anxiety Depression    negative neurological ROS     GI/Hepatic Neg liver ROS,GERD  Controlled,,  Endo/Other  negative endocrine ROS    Renal/GU negative Renal ROS  negative genitourinary   Musculoskeletal negative musculoskeletal ROS (+)    Abdominal   Peds  Hematology  (+) Blood dyscrasia, anemia   Anesthesia Other Findings L breast ca s/p bilateral mastectomy  Documented as difficult airway but easy mask and successful with 1 glidescope attempt on most recent anesthetic   Reproductive/Obstetrics negative OB ROS                              Anesthesia Physical Anesthesia Plan  ASA: 3  Anesthesia Plan: Regional and General   Post-op Pain Management: Ofirmev  IV (intra-op)* and Tylenol  PO (pre-op)*   Induction: Intravenous  PONV Risk Score and Plan: 4 or greater and Treatment may vary due to age or medical condition, Midazolam , Ondansetron , Dexamethasone  and TIVA  Airway Management Planned: Oral ETT  Additional Equipment: None  Intra-op Plan:   Post-operative Plan: Extubation in OR  Informed Consent: I have reviewed the patients  History and Physical, chart, labs and discussed the procedure including the risks, benefits and alternatives for the proposed anesthesia with the patient or authorized representative who has indicated his/her understanding and acceptance.     Dental advisory given  Plan Discussed with: CRNA  Anesthesia Plan Comments:         Anesthesia Quick Evaluation

## 2024-06-03 NOTE — Op Note (Signed)
 Ann Daugherty PROCEDURE DATE: 06/03/2024  PREOPERATIVE DIAGNOSIS: history of metastatic ER+/PR+/HER2- breast cancer, history of tamoxifen  use POSTOPERATIVE DIAGNOSIS: The same PROCEDURE: Total laparoscopic hysterectomy, bilateral salpingo-oophorectomy, cystoscopy SURGEON:  Dr. Kieth Carolin ASSISTANT:  Dr. Vina Solian.  An experienced assistant was required given the standard of surgical care given the complexity of the case.  This assistant was needed for exposure, dissection, suctioning, retraction, instrument exchange, and for overall help during the procedure.  INDICATIONS: 53 y.o. G4P0  here for definitive surgical management secondary to the indications listed under preoperative diagnoses; please see preoperative note for further details.  Risks of surgery were discussed with the patient including but not limited to: bleeding which may require transfusion or reoperation; infection which may require antibiotics; injury to bowel, bladder, ureters or other surrounding organs; need for additional procedures; thromboembolic phenomenon, incisional problems and other postoperative/anesthesia complications. Written informed consent was obtained.    FINDINGS: Normal uterus, tubes and ovaries. Bilateral ureteral jets, intact bladder.    ANESTHESIA:    General INTRAVENOUS FLUIDS:1400  ml ESTIMATED BLOOD LOSS:50 ml URINE OUTPUT: 150 ml   SPECIMENS: Uterus, cervix, bilateral fallopian tubes, bilateral ovaries. Mild physiologic adhesions of the colon to the left pelvic sidewall. COMPLICATIONS: None immediate  PROCEDURE IN DETAIL:  The patient received intravenous antibiotics and had sequential compression devices applied to her lower extremities while in the preoperative area.  She was then taken to the operating room where general anesthesia was administered and was found to be adequate.  She was placed in the dorsal lithotomy position, and was prepped and draped in a sterile manner.    After  an adequate timeout was performed, a Rumi uterine manipulator was placed and Foley catheter was inserted into her bladder and attached to constant drainage.  Attention turned to patient's abdomen. 1cm skin incision made with the scalpel in the umbilicus. The fascia was identified using blunt dissection, elevated and sharply divided. The peritoneum was then grasped and sharply entered. 0-vicryl was used to tag the fascia. A 12mm Hasson trocar was placed and intraperitoneal entry was confirmed with the laparoscope. The abdomen was insufflated. Inspection below the point of entry revealed no evidence of injury. Patient placed in steep trendelenburg. Three 5mm balloon ports were then placed under direct visualization (RLQ, LLQ, & suprapubic).   The pelvis was then carefully examined with the above findings. The left fallopian tube was freed from the mesosalpinx and pelvic attachments using the Enseal. The filmy connections between the colon and left pelvic sidewall were then taken down sharply to lateralize the colon and allow for access to the left adnexa/pelvic sidewall. The peritoneum overlying the left pelvic sidewall was opened and the left ureter was identified. A window was made in the peritoneum between the IP and the ureter. The IP was then sealed/ligated and transected using the Enseal device using this window to keep ureter safely away from the plane of dissection. The round ligament was then sealed and transected using the Enseal and the bladder flap was developed. The posterior leaf of the broad was also dropped laterally with the Enseal. The uterine vessels were skeletonized.   Attention was turned to the right side. The right round ligament was sealed and transected. The peritoneum over the pelvic side wall was then opened, the ureter was identified and a window between the IP and the ureter was developed. As on the left side, the IP was sealed and transected. The procedure was then repeated as on  the left  side. The bladder was dissected off the lower uterus/cervix using gentle blunt and sharp dissection. At this point, attention was turned to the uterine vessels, which were clamped and ligated using the Enseal. Attention was then turned to the cervicovaginal junction, and the bovie hook was used to transect the cervix from the surrounding vagina using the ring of the Rumi as a guide. This was done circumferentially allowing total hysterectomy.  The uterus was then removed from the vagina and the vaginal cuff incision was then closed with 0 V-loc in a running fashion.  Overall excellent hemostasis was noted.   Attention was turned to cystoscopy. Bilateral ureteral jets were noted and the bladder was intact without evidence of injury or foreign body. The cystoscope was removed.   A small amount of venous oozing was noted at one site over the bladder on reinspection of the pelvic. Pressure was applied followed by Arista. Excellent hemostasis noted. The abdominal pressure was reduced and hemostasis was confirmed.     All trocars were removed under direct visualization, and the abdomen was desufflated. The umbilical fascia was closed using the tags of the 0-vicryl and an additional simple suture of 0-vicryl. All skin incisions were closed with 4-0 Vicryl subcuticular stitches and Dermabond. The patient tolerated the procedures well.    All instruments, needles, and sponge counts were correct x 2. The patient was taken to the recovery room awake, extubated and in stable condition.   Kieth Carolin, MD Obstetrician & Gynecologist, [facility] for [employer], [facility]

## 2024-06-03 NOTE — Anesthesia Procedure Notes (Addendum)
 Procedure Name: Intubation Date/Time: 06/03/2024 5:22 PM  Performed by: Erma Thom SAUNDERS, MDPre-anesthesia Checklist: Patient identified, Emergency Drugs available, Suction available and Patient being monitored Patient Re-evaluated:Patient Re-evaluated prior to induction Oxygen Delivery Method: Circle System Utilized Preoxygenation: Pre-oxygenation with 100% oxygen Induction Type: IV induction Ventilation: Mask ventilation without difficulty Laryngoscope Size: Glidescope and 3 Grade View: Grade I Tube type: Oral Tube size: 7.0 mm Number of attempts: 1 Airway Equipment and Method: Stylet and Oral airway Placement Confirmation: ETT inserted through vocal cords under direct vision, positive ETCO2 and breath sounds checked- equal and bilateral Secured at: 21 cm Tube secured with: Tape Dental Injury: Teeth and Oropharynx as per pre-operative assessment

## 2024-06-03 NOTE — Transfer of Care (Signed)
 Immediate Anesthesia Transfer of Care Note  Patient: Ann Daugherty  Procedure(s) Performed: HYSTERECTOMY, TOTAL, LAPAROSCOPIC, WITH BILATERAL SALPINGO-OOPHORECTOMY (Bilateral: Pelvis) CYSTOSCOPY (Bladder)  Patient Location: PACU  Anesthesia Type:General  Level of Consciousness: awake, drowsy, patient cooperative, and responds to stimulation  Airway & Oxygen Therapy: Patient Spontanous Breathing and Patient connected to face mask oxygen  Post-op Assessment: Report given to RN and Post -op Vital signs reviewed and stable  Post vital signs: Reviewed and stable  Last Vitals:  Vitals Value Taken Time  BP 123/71 06/03/24 19:25  Temp 36.3 C 06/03/24 19:25  Pulse 81 06/03/24 19:25  Resp 18 06/03/24 19:25  SpO2 97 % 06/03/24 19:25  Vitals shown include unfiled device data.  Pt denies pain  Patients Stated Pain Goal: 5 (06/03/24 1118)  Complications: No notable events documented.

## 2024-06-04 ENCOUNTER — Other Ambulatory Visit: Payer: Self-pay

## 2024-06-04 ENCOUNTER — Encounter (HOSPITAL_COMMUNITY): Payer: Self-pay | Admitting: Obstetrics and Gynecology

## 2024-06-04 ENCOUNTER — Other Ambulatory Visit (HOSPITAL_COMMUNITY): Payer: Self-pay

## 2024-06-06 ENCOUNTER — Ambulatory Visit: Payer: Self-pay | Admitting: Obstetrics and Gynecology

## 2024-06-06 LAB — SURGICAL PATHOLOGY

## 2024-06-09 ENCOUNTER — Other Ambulatory Visit

## 2024-06-10 ENCOUNTER — Inpatient Hospital Stay: Attending: Adult Health

## 2024-06-10 ENCOUNTER — Ambulatory Visit: Admitting: Hematology and Oncology

## 2024-06-10 ENCOUNTER — Inpatient Hospital Stay

## 2024-06-10 DIAGNOSIS — C50212 Malignant neoplasm of upper-inner quadrant of left female breast: Secondary | ICD-10-CM | POA: Diagnosis not present

## 2024-06-10 DIAGNOSIS — Z5189 Encounter for other specified aftercare: Secondary | ICD-10-CM | POA: Insufficient documentation

## 2024-06-10 DIAGNOSIS — Z95828 Presence of other vascular implants and grafts: Secondary | ICD-10-CM

## 2024-06-10 DIAGNOSIS — Z17411 Hormone receptor positive with human epidermal growth factor receptor 2 negative status: Secondary | ICD-10-CM | POA: Insufficient documentation

## 2024-06-10 DIAGNOSIS — Z170 Estrogen receptor positive status [ER+]: Secondary | ICD-10-CM

## 2024-06-10 LAB — CMP (CANCER CENTER ONLY)
ALT: 10 U/L (ref 0–44)
AST: 13 U/L — ABNORMAL LOW (ref 15–41)
Albumin: 4.1 g/dL (ref 3.5–5.0)
Alkaline Phosphatase: 60 U/L (ref 38–126)
Anion gap: 4 — ABNORMAL LOW (ref 5–15)
BUN: 12 mg/dL (ref 6–20)
CO2: 30 mmol/L (ref 22–32)
Calcium: 8.8 mg/dL — ABNORMAL LOW (ref 8.9–10.3)
Chloride: 104 mmol/L (ref 98–111)
Creatinine: 0.63 mg/dL (ref 0.44–1.00)
GFR, Estimated: >60 mL/min (ref >60)
Glucose, Bld: 96 mg/dL (ref 70–99)
Potassium: 4 mmol/L (ref 3.5–5.1)
Sodium: 138 mmol/L (ref 135–145)
Total Bilirubin: 0.3 mg/dL (ref 0.0–1.2)
Total Protein: 6.7 g/dL (ref 6.5–8.1)

## 2024-06-10 LAB — CBC WITH DIFFERENTIAL (CANCER CENTER ONLY)
Abs Immature Granulocytes: 0.02 K/uL (ref 0.00–0.07)
Basophils Absolute: 0 K/uL (ref 0.0–0.1)
Basophils Relative: 1 %
Eosinophils Absolute: 0.1 K/uL (ref 0.0–0.5)
Eosinophils Relative: 2 %
HCT: 36 % (ref 36.0–46.0)
Hemoglobin: 12.1 g/dL (ref 12.0–15.0)
Immature Granulocytes: 1 %
Lymphocytes Relative: 17 %
Lymphs Abs: 0.7 K/uL (ref 0.7–4.0)
MCH: 31.2 pg (ref 26.0–34.0)
MCHC: 33.6 g/dL (ref 30.0–36.0)
MCV: 92.8 fL (ref 80.0–100.0)
Monocytes Absolute: 0.4 K/uL (ref 0.1–1.0)
Monocytes Relative: 9 %
Neutro Abs: 3 K/uL (ref 1.7–7.7)
Neutrophils Relative %: 70 %
Platelet Count: 288 K/uL (ref 150–400)
RBC: 3.88 MIL/uL (ref 3.87–5.11)
RDW: 12.5 % (ref 11.5–15.5)
WBC Count: 4.2 K/uL (ref 4.0–10.5)
nRBC: 0 % (ref 0.0–0.2)

## 2024-06-10 MED ORDER — SODIUM CHLORIDE 0.9% FLUSH
10.0000 mL | Freq: Once | INTRAVENOUS | Status: AC
Start: 2024-06-10 — End: 2024-06-10
  Administered 2024-06-10: 10 mL

## 2024-06-10 NOTE — Progress Notes (Signed)
 No blood return noted from [facility]. Flushes easily. Lab appt made, pt arrived to lab.

## 2024-06-11 LAB — CANCER ANTIGEN 27.29: CA 27.29: 17.7 U/mL (ref 0.0–38.6)

## 2024-06-12 DIAGNOSIS — Z823 Family history of stroke: Secondary | ICD-10-CM | POA: Diagnosis not present

## 2024-06-12 DIAGNOSIS — Z5948 Other specified lack of adequate food: Secondary | ICD-10-CM | POA: Diagnosis not present

## 2024-06-12 DIAGNOSIS — Z833 Family history of diabetes mellitus: Secondary | ICD-10-CM | POA: Diagnosis not present

## 2024-06-12 DIAGNOSIS — I4891 Unspecified atrial fibrillation: Secondary | ICD-10-CM | POA: Diagnosis not present

## 2024-06-12 DIAGNOSIS — Z7901 Long term (current) use of anticoagulants: Secondary | ICD-10-CM | POA: Diagnosis not present

## 2024-06-12 DIAGNOSIS — Z8249 Family history of ischemic heart disease and other diseases of the circulatory system: Secondary | ICD-10-CM | POA: Diagnosis not present

## 2024-06-12 DIAGNOSIS — M62838 Other muscle spasm: Secondary | ICD-10-CM | POA: Diagnosis not present

## 2024-06-12 DIAGNOSIS — F17210 Nicotine dependence, cigarettes, uncomplicated: Secondary | ICD-10-CM | POA: Diagnosis not present

## 2024-06-12 DIAGNOSIS — C50919 Malignant neoplasm of unspecified site of unspecified female breast: Secondary | ICD-10-CM | POA: Diagnosis not present

## 2024-06-12 DIAGNOSIS — F321 Major depressive disorder, single episode, moderate: Secondary | ICD-10-CM | POA: Diagnosis not present

## 2024-06-12 DIAGNOSIS — Z809 Family history of malignant neoplasm, unspecified: Secondary | ICD-10-CM | POA: Diagnosis not present

## 2024-06-16 ENCOUNTER — Ambulatory Visit: Admitting: Hematology and Oncology

## 2024-06-17 DIAGNOSIS — Z419 Encounter for procedure for purposes other than remedying health state, unspecified: Secondary | ICD-10-CM | POA: Diagnosis not present

## 2024-06-19 ENCOUNTER — Other Ambulatory Visit: Payer: Self-pay | Admitting: Hematology and Oncology

## 2024-06-19 ENCOUNTER — Telehealth: Payer: Self-pay

## 2024-06-19 ENCOUNTER — Inpatient Hospital Stay (HOSPITAL_BASED_OUTPATIENT_CLINIC_OR_DEPARTMENT_OTHER): Admitting: Adult Health

## 2024-06-19 ENCOUNTER — Encounter: Payer: Self-pay | Admitting: Hematology and Oncology

## 2024-06-19 ENCOUNTER — Encounter: Payer: Self-pay | Admitting: Adult Health

## 2024-06-19 ENCOUNTER — Other Ambulatory Visit (HOSPITAL_COMMUNITY): Payer: Self-pay

## 2024-06-19 VITALS — BP 114/56 | HR 93 | Temp 98.2°F | Resp 18 | Ht 66.0 in | Wt 132.9 lb

## 2024-06-19 DIAGNOSIS — Z170 Estrogen receptor positive status [ER+]: Secondary | ICD-10-CM

## 2024-06-19 DIAGNOSIS — C50212 Malignant neoplasm of upper-inner quadrant of left female breast: Secondary | ICD-10-CM

## 2024-06-19 DIAGNOSIS — Z17411 Hormone receptor positive with human epidermal growth factor receptor 2 negative status: Secondary | ICD-10-CM | POA: Diagnosis not present

## 2024-06-19 DIAGNOSIS — Z5189 Encounter for other specified aftercare: Secondary | ICD-10-CM | POA: Diagnosis not present

## 2024-06-19 MED ORDER — ABEMACICLIB 100 MG PO TABS
100.0000 mg | ORAL_TABLET | Freq: Two times a day (BID) | ORAL | 3 refills | Status: DC
  Filled 2024-06-23: qty 56, 28d supply, fill #0
  Filled 2024-07-16: qty 56, 28d supply, fill #1
  Filled 2024-08-18 – 2024-09-01 (×2): qty 56, 28d supply, fill #2
  Filled 2024-10-10 – 2024-10-17 (×2): qty 56, 28d supply, fill #3

## 2024-06-19 MED ORDER — ANASTROZOLE 1 MG PO TABS
1.0000 mg | ORAL_TABLET | Freq: Every day | ORAL | 3 refills | Status: AC
  Filled 2024-06-19 (×2): qty 90, 90d supply, fill #0
  Filled 2024-09-19: qty 90, 90d supply, fill #1

## 2024-06-19 NOTE — Telephone Encounter (Signed)
 Oral Oncology Patient Advocate Encounter   After Benefits Investigation, a prior authorization for Verzenio  100mg  is required.   PA submitted on 06/19/24. Key: [id number]  Waiting on provider to complete notes so I can attach to PA questions.    Lucie Lamer, CPhT [place]  [facility] Pharmacy Technician Patient Advocate Specialist II [place] Phone: [phone]  Fax: [phone] [email or web address]

## 2024-06-19 NOTE — Progress Notes (Unsigned)
 [facility] Cancer Follow up:    Ann Potts, MD [address] [place] [address]   DIAGNOSIS: Cancer Staging  Malignant neoplasm of upper-inner quadrant of left female breast [facility]) Staging form: Breast, AJCC 8th Edition - Clinical: Stage IIIB (cT4b, cN0, cM0, G3, ER+, PR+, HER2-) - Signed by Ann Potts, MD on 07/26/2023 Histologic grading system: 3 grade system - Pathologic: Stage IV (pM1) - Signed by Crawford Morna Pickle, NP on 08/21/2023    SUMMARY OF ONCOLOGIC HISTORY: Oncology History  Malignant neoplasm of upper-inner quadrant of left female breast (HCC)  07/19/2023 Initial Diagnosis   Palpable left breast mass for 6 months.  Mammogram and ultrasound revealed large masses.  10:30 position: 4.3 cm with skin thickening and ulceration: Grade 2 IDC with high-grade DCIS ER 95%, PR 95%, Ki67 30%, HER2 2+ by IHC, FISH negative ratio 1.37; 5 o'clock position: 3.3 cm with calcifications spanning 6.7 cm, axilla negative, biopsy: Grade 3 IDC with necrosis ER 95% PR 90% Ki-67 60% HER2 1+ negative   07/26/2023 Cancer Staging   Staging form: Breast, AJCC 8th Edition - Clinical: Stage IIIB (cT4b, cN0, cM0, G3, ER+, PR+, HER2-) - Signed by Ann Potts, MD on 07/26/2023 Histologic grading system: 3 grade system   08/15/2023 -  Chemotherapy   Patient is on Treatment Plan : BREAST ADJUVANT DOSE DENSE AC q14d / PACLitaxel  q7d     08/17/2023 Initial Biopsy   Liver biopsy: consistent with metastatic carcinoma, breast primary.     08/21/2023 Cancer Staging   Staging form: Breast, AJCC 8th Edition - Pathologic: Stage IV (pM1) - Signed by Crawford Morna Pickle, NP on 08/21/2023     CURRENT THERAPY:  INTERVAL HISTORY:  Discussed the use of AI scribe software for clinical note transcription with the patient, who gave verbal consent to proceed.  Ann Daugherty 53 y.o. female returns for    Patient Active Problem List   Diagnosis Date Noted  .  Estrogen receptor positive 06/03/2024  . Primary cancer of upper inner quadrant of left breast (HCC) 01/24/2024  . Port-A-Cath in place 08/15/2023  . Malignant neoplasm of upper-inner quadrant of left female breast (HCC) 07/20/2023  . Depression, major, single episode, mild (HCC) 04/05/2021  . Tobacco abuse 01/30/2018  . Anxiety 01/30/2018  . Attention deficit disorder 01/30/2018  . Vitamin D  deficiency 01/30/2018    has no known allergies.  MEDICAL HISTORY: Past Medical History:  Diagnosis Date  . ADD (attention deficit disorder)   . Anemia   . Breast cancer metastasized to liver, unspecified laterality (HCC) 08/2023   01-16-2024 s/p microwave ablation large liver lesion w/ residual small lesion  . Depression   . GAD (generalized anxiety disorder)   . GERD (gastroesophageal reflux disease)   . History of antineoplastic chemotherapy 08/2023   08-15-2023  to  20-20-2025  . History of external beam radiation therapy 01/24/2024   left chest wall   s/p bilateral mastectomy   01-24-2024  to  04-16-2024  . Malignant neoplasm of upper-inner quadrant of left breast in female, estrogen receptor positive (HCC) 07/2023   oncologist-- dr catrina gyn oncology--- dr forsyth/ surgeon -- dr aron;  dx 07/2023  HGDCIS/ ICDG2; liver bx confirmed mets ; Stage IV;  completed chemo 02/ 2025; 03/ 2025 s/p bilateral mastectomy; completed IMRT 04-16-2024; scheduled for Hystectomy w/ BSO 06-03-2024  . Pap smear abnormality of cervix with LGSIL 05/20/2024   w/ + HPV  . PONV (postoperative nausea and vomiting)   .  Port-A-Cath in place 08/14/2023    SURGICAL HISTORY: Past Surgical History:  Procedure Laterality Date  . BREAST BIOPSY Left 07/19/2023   US  LT BREAST BX W LOC DEV EA ADD LESION IMG BX SPEC US  GUIDE 07/19/2023 [facility]  . BREAST BIOPSY Left 07/19/2023   US  LT BREAST BX W LOC DEV 1ST LESION IMG BX SPEC US  GUIDE 07/19/2023 [facility]  . BREAST BIOPSY Right 08/09/2023   US  RT  BREAST BX W LOC DEV 1ST LESION IMG BX SPEC US  GUIDE 08/09/2023 [facility]  . BREAST BIOPSY Right 08/09/2023   US  RT BREAST BX W LOC DEV EA ADD LESION IMG BX SPEC US  GUIDE 08/09/2023 [facility]  . CYSTOSCOPY N/A 06/03/2024   Procedure: CYSTOSCOPY;  Surgeon: Erik Kieth BROCKS, MD;  Location: [facility] OR;  Service: Gynecology;  Laterality: N/A;  . DILATION AND CURETTAGE OF UTERUS  1990  . IR GUIDED LIVER TUMOR ABLATION RFA PERC    . IR RADIOLOGIST EVAL & MGMT  01/08/2024  . IR RADIOLOGIST EVAL & MGMT  01/31/2024  . IR RADIOLOGIST EVAL & MGMT  04/17/2024  . LAMINECTOMY AND MICRODISCECTOMY LUMBAR SPINE  2011   by dr onetha;   re-do L4--5  . LUMBAR LAMINECTOMY/DECOMPRESSION WITH DISCECTOMY  09/17/2006   @[facility]  by dr onetha;   L4-5  . PORTACATH PLACEMENT Right 08/14/2023   Procedure: INSERTION PORT-A-CATH WITH ULTRASOUND ANDREA;  Surgeon: Aron Shoulders, MD;  Location: [facility] OR;  Service: General;  Laterality: Right;  . SIMPLE MASTECTOMY WITH AXILLARY SENTINEL NODE BIOPSY Bilateral 01/24/2024   Procedure: BILATERAL MASTECTOMY;  Surgeon: Aron Shoulders, MD;  Location: [facility];  Service: General;  Laterality: Bilateral;  PEC BLOCK  . TOTAL LAPAROSCOPIC HYSTERECTOMY WITH BILATERAL SALPINGO OOPHORECTOMY Bilateral 06/03/2024   Procedure: HYSTERECTOMY, TOTAL, LAPAROSCOPIC, WITH BILATERAL SALPINGO-OOPHORECTOMY;  Surgeon: Erik Kieth BROCKS, MD;  Location: [facility] OR;  Service: Gynecology;  Laterality: Bilateral;  TLH/BSO/cysto    SOCIAL HISTORY: Social History   Socioeconomic History  . Marital status: Divorced    Spouse name: Not on file  . Number of children: 2  . Years of education: Not on file  . Highest education level: Not on file  Occupational History  . Not on file  Tobacco Use  . Smoking status: Former    Current packs/day: 0.00    Average packs/day: 0.5 packs/day for 30.0 years (15.0 ttl pk-yrs)    Types: Cigarettes    Start date: 04/07/1991    Quit date: 04/06/2021     Years since quitting: 3.2  . Smokeless tobacco: Never  Vaping Use  . Vaping status: Every Day  . Substances: Nicotine  . Devices: jewel  (pt stated started in 2022)  Substance and Sexual Activity  . Alcohol use: Not Currently  . Drug use: Not Currently    Comment: 05-28-2024  CBD gummies , last time 2022  . Sexual activity: Not Currently    Birth control/protection: None  Other Topics Concern  . Not on file  Social History Narrative   2  sons: 52 y.o. and 2 y.o.    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 Worried About Programme researcher, broadcasting/film/video in the Last Year: Sometimes true   . Ran Out of Food in the Last Year: Sometimes true  Transportation Needs: No Transportation Needs (02/19/2024)   PRAPARE -  Transportation   . Lack of Transportation (Medical): No   . Lack of Transportation (Non-Medical): No  Physical Activity: Not on file  Stress: Not on file  Social Connections: Not on file  Intimate Partner Violence: Not At Risk (02/19/2024)   Humiliation, Afraid, Rape, and Kick questionnaire   . Fear of Current or Ex-Partner: No   . Emotionally Abused: No   . Physically Abused: No   . Sexually Abused: No    FAMILY HISTORY: Family History  Problem Relation Age of Onset  . Breast cancer Mother 65  . Colon cancer Mother 34  . Asthma Mother   . Hypertension Mother   . Hearing loss Mother   . Hypertension Father   . Diabetes Father   . Hyperlipidemia Father   . Learning disabilities Son   . ADD / ADHD Son   . Ovarian cancer Maternal Grandmother   . Hypertension Maternal Grandfather   . Hyperlipidemia Maternal Grandfather   . Heart disease Maternal Grandfather   . Stroke Maternal Grandfather   . Mental illness Paternal Grandmother   . Hypertension Paternal Grandfather     Review of Systems - Oncology    PHYSICAL EXAMINATION   Onc  Performance Status - 06/19/24 1355       ECOG Perf Status   ECOG Perf Status Restricted in physically strenuous activity but ambulatory and able to carry out work of a light or sedentary nature, e.g., light house work, office work      KPS SCALE   KPS % SCORE Able to carry on normal activity, minor s/s of disease          Vitals:   06/19/24 1351  BP: (!) 114/56  Pulse: 93  Resp: 18  Temp: 98.2 F (36.8 C)  SpO2: 97%    Physical Exam  LABORATORY DATA:  CBC    Component Value Date/Time   WBC 4.2 06/10/2024 1504   WBC 4.1 05/30/2024 1000   RBC 3.88 06/10/2024 1504   HGB 12.1 06/10/2024 1504   HCT 36.0 06/10/2024 1504   PLT 288 06/10/2024 1504   MCV 92.8 06/10/2024 1504   MCH 31.2 06/10/2024 1504   MCHC 33.6 06/10/2024 1504   RDW 12.5 06/10/2024 1504   LYMPHSABS 0.7 06/10/2024 1504   MONOABS 0.4 06/10/2024 1504   EOSABS 0.1 06/10/2024 1504   BASOSABS 0.0 06/10/2024 1504    CMP     Component Value Date/Time   NA 138 06/10/2024 1504   K 4.0 06/10/2024 1504   CL 104 06/10/2024 1504   CO2 30 06/10/2024 1504   GLUCOSE 96 06/10/2024 1504   BUN 12 06/10/2024 1504   CREATININE 0.63 06/10/2024 1504   CREATININE 0.63 08/09/2020 1032   CALCIUM 8.8 (L) 06/10/2024 1504   PROT 6.7 06/10/2024 1504   ALBUMIN 4.1 06/10/2024 1504   AST 13 (L) 06/10/2024 1504   ALT 10 06/10/2024 1504   ALKPHOS 60 06/10/2024 1504   BILITOT 0.3 06/10/2024 1504   GFRNONAA >60 06/10/2024 1504     ASSESSMENT and THERAPY PLAN:   No problem-specific Assessment & Plan notes found for this encounter.     All questions were answered. The patient knows to call the clinic with any problems, questions or concerns. We can certainly see the patient much sooner if necessary.  Total encounter time:*** minutes*in face-to-face visit time, chart review, lab review, care coordination, order entry, and documentation of the encounter time.    Morna Kendall, NP 06/19/24 2:03 PM Medical Oncology  and Hematology [facility] [address] [place], [place] [address] Tel. [phone]    Fax. [phone]  *Total Encounter Time as defined by the Centers for Medicare and Medicaid Services includes, in addition to the face-to-face time of a patient visit (documented in the note above) non-face-to-face time: obtaining and reviewing outside history, ordering and reviewing medications, tests or procedures, care coordination (communications with other health care professionals or caregivers) and documentation in the medical record.

## 2024-06-23 ENCOUNTER — Other Ambulatory Visit: Payer: Self-pay

## 2024-06-23 ENCOUNTER — Other Ambulatory Visit (HOSPITAL_COMMUNITY): Payer: Self-pay

## 2024-06-23 ENCOUNTER — Encounter: Payer: Self-pay | Admitting: Hematology and Oncology

## 2024-06-23 NOTE — Progress Notes (Unsigned)
 Specialty Pharmacy Initial Fill Coordination Note  Katee J Mccoin is a 53 y.o. female contacted today regarding refills of specialty medication(s) Abemaciclib  (VERZENIO ) .  Patient requested [name] at [facility] [place]  on 06/25/24   Medication will be filled on 06/24/24.   Patient is aware of $0.00 copayment.

## 2024-06-23 NOTE — Telephone Encounter (Addendum)
 Oral Oncology Patient Advocate Encounter  Prior Authorization for Verzenio   has been approved.    PA# [id number]  Effective dates: 06/23/24 through 06/23/25  Patients co-pay is $0.00.     Charlott Hamilton,  CPhT-Adv  she/her/hers [facility]  [facility] Pharmacy Technician Patient Advocate Specialist III [facility] Phone: [phone]  Fax: [phone] [email or web address]

## 2024-06-23 NOTE — Assessment & Plan Note (Signed)
 07/19/2023:Palpable left breast mass for 6 months.  Mammogram and ultrasound revealed large masses.   10:30 position: 4.3 cm with skin thickening and ulceration: Grade 2 IDC with high-grade DCIS ER 95%, PR 95%, Ki67 30%, HER2 2+ by IHC, FISH negative ratio 1.37;  5 o'clock position: 3.3 cm with calcifications spanning 6.7 cm, axilla negative, biopsy: Grade 3 IDC with necrosis ER 95% PR 90% Ki-67 60% HER2 1+ negative   CT CAP 08/03/2023: 2 prominent left breast masses, left axillary lymph nodes, right hepatic lobe lesion concerning for metastatic breast cancer Ultrasound a liver biopsy scheduled for 08/17/2023   Treatment plan: Neoadjuvant chemotherapy with dose Adriamycin  and Cytoxan  followed by Taxol  completed 12/27/2023 Mastectomy left breast 01/24/2024 Adjuvant radiation Liver directed therapy (underwent microwave ablation 01/16/2024) Antiestrogen therapy with CDK inhibitors (currently on tamoxifen , will switch to anastrozole  after hysterectomy) ---------------------------------------------------------------------------------------------------- MRI liver 12/26/2023: Significantly diminished size of hepatic metastases (0.6 cm, 1.4 cm, (used to be 3.6 cm), 0.7 cm) MRI breast 12/31/2023: Significant positive response to chemo.  Both left malignancies decrease in size (3 cm with enhancement) and 1.1 cm CT CAP 12/26/2023: Significant decrease in breast masses, resolution of left axillary lymph nodes, decrease liver metastases  01/24/24: Bilateral Mastectomies Right: Benign Left: 2 residual tumors 2.8 cm and 1.6 cm, Margins neg, DCIS, ER 85%, PR: 25%, Her 2: 1+ Neg, Ki 67: 10% Liver MRI 04/12/2024: Stable 6 mm subcapsular left hepatic dome lesion similar, wedge-shaped deformity right hepatic lobe 1 x 2 cm without enhancement (treated metastatic lesion) 06/01/2023 CT C/A/P and bone scan: negative for new or recurrent malignancy 06/03/2024: TAH/BSO  Recommendation: Verzenio  BID and Anastrozole   Assessment &  Plan Stage IV rectal carcinoma, post-hysterectomy, on maintenance therapy Stage IV rectal carcinoma with recent hysterectomy and bilateral oophorectomy. Imaging showed small sclerotic lesions likely due to radiation, not metastasis. Liver lesion stable. Transitioning to maintenance therapy with Verzenio  and anastrozole . - Start anastrozole , one tablet daily, monitor for side effects such as hot flashes, vaginal dryness, joint aches and pains. - Initiate Verzenio  after insurance approval, starting dose 100 mg twice daily. - Coordinate with Norleen Parkinson, CPP for guidance on Verzenio  and supplements. - Monitor for diarrhea and adjust treatment as needed. - Ensure delivery of Verzenio  through specialty pharmacy. - Remove unnecessary pregnancy test orders.  Surgical menopause (post-hysterectomy and bilateral oophorectomy) with menopausal symptoms Experiencing menopausal symptoms post-hysterectomy and oophorectomy. Transitioning from tamoxifen  to anastrozole , which may initially worsen symptoms but expected to improve. Discussed anastrozole  side effects and management strategies. - Monitor for menopausal symptoms and adjust anastrozole  dosing if intolerable. - Discuss potential use of glucosamine and chondroitin for joint pain, with caution regarding Verzenio  interaction. - Encourage lifestyle modifications such as yoga and swimming to manage symptoms.  Lymphedema of trunk and cording (axillary web syndrome) post-surgery Lymphedema and cording present post-surgery, with a pocket of edema under the scar. Physical therapy and lymphatic massage have been beneficial. - Continue physical therapy and lymphatic massage to manage lymphedema and improve range of motion.  RTC for f/u with Norleen Parkinson, CPP and Dr. Gudena.

## 2024-06-24 NOTE — Progress Notes (Unsigned)
 [facility]       Telephone: [phone]?Fax: [phone]   Oncology Clinical Pharmacist Practitioner Initial Assessment  Ann Daugherty is a 53 y.o. female with a diagnosis of breast cancer. They were contacted today via in-person visit.  Indication/Regimen Abemaciclib  (Verzenio ) is being used appropriately for treatment of metastatic breast cancer by Dr. Vinay Gudena. The treatment goal is: Palliative.     Wt Readings from Last 1 Encounters:  06/25/24 136 lb 3.2 oz (61.8 kg)    Estimated body surface area is 1.7 meters squared as calculated from the following:   Height as of 06/19/24: 5' 6 (1.676 m).   Weight as of this encounter: 136 lb 3.2 oz (61.8 kg).  The dosing regimen is 100 mg by mouth every 12 hours on days 1 to 28 of a 28-day cycle. This is being given  in combination with anastrozole . It is planned to continue until disease progression or unacceptable toxicity. Prescription dose and frequency assessed for appropriateness.  Patient has agreed to treatment which is documented in physician note on 04/14/24. Counseled patient on administration, dosing, side effects, monitoring, drug-food interactions, safe handling, storage, and disposal.  Ann Daugherty will pick up abemaciclib  today from [facility] and start tomorrow morning.   Dose Modifications Dr. Gudena is starting abemaciclib  at 100 mg PO BID  Access Assessment Ann Daugherty will be receiving abemaciclib  through [facility] Concerns: none Start date if known: 06/26/24  Adherence Assessment Reviewed importance on keeping a med schedule and plan for any missed doses Barriers to adherence identified? No  Communication and Learning Assessment Primary learner: patient Barriers to learning: No barriers Preferred language: English Learning preferences: Listening   Allergies No Known Allergies  Vitals    06/25/2024    2:08 PM 06/19/2024    1:51 PM 06/03/2024    8:00  PM  Oncology Vitals  Height  168 cm   Weight 61.78 kg 60.283 kg   Weight (lbs) 136 lbs 3 oz 132 lbs 14 oz   BMI 21.98 kg/m2 21.45 kg/m2   Temp 97.7 F (36.5 C) 98.2 F (36.8 C) 97.6 F (36.4 C)  Pulse Rate 84 93 72  BP 107/60 114/56 123/71  Resp 18 18 27   SpO2 99 % 97 % 93 %  BSA (m2) 1.7 m2 1.68 m2      Laboratory Data    Latest Ref Rng & Units 06/10/2024    3:04 PM 05/30/2024   10:00 AM 01/16/2024    8:19 AM  CBC EXTENDED  WBC 4.0 - 10.5 K/uL 4.2  4.1  4.0   RBC 3.87 - 5.11 MIL/uL 3.88  4.08  3.74   Hemoglobin 12.0 - 15.0 g/dL 87.8  87.2  88.0   HCT 36.0 - 46.0 % 36.0  38.5  35.0   Platelets 150 - 400 K/uL 288  232  241   NEUT# 1.7 - 7.7 K/uL 3.0   2.6   Lymph# 0.7 - 4.0 K/uL 0.7   0.9        Latest Ref Rng & Units 06/10/2024    3:04 PM 01/16/2024    8:19 AM 12/27/2023    9:30 AM  CMP  Glucose 70 - 99 mg/dL 96  893  899   BUN 6 - 20 mg/dL 12  14  11    Creatinine 0.44 - 1.00 mg/dL 9.36  9.37  9.39   Sodium 135 - 145 mmol/L 138  138  136  Potassium 3.5 - 5.1 mmol/L 4.0  3.8  4.1   Chloride 98 - 111 mmol/L 104  104  104   CO2 22 - 32 mmol/L 30  27  28    Calcium 8.9 - 10.3 mg/dL 8.8  9.0  9.0   Total Protein 6.5 - 8.1 g/dL 6.7  6.6  6.2   Total Bilirubin 0.0 - 1.2 mg/dL 0.3  0.5  0.2   Alkaline Phos 38 - 126 U/L 60  56  68   AST 15 - 41 U/L 13  21  14    ALT 0 - 44 U/L 10  18  11     Contraindications Contraindications were reviewed? Yes Contraindications to therapy were identified? No   Safety Precautions The following safety precautions for the use of abemaciclib  were reviewed:  Changes in kidney function: importance of drinking plenty of fluids and monitoring urine output Diarrhea: we reviewed that diarrhea is common with abemaciclib  and confirmed that she does have loperamide (Imodium) at home.  We reviewed how to take this medication PRN and gave her information on abemaciclib  Decreased white blood cells (WBCs) and increased risk for infection: we discussed the  importance of having a thermometer and what the Centers for Disease Control and Prevention (CDC) considers a fever which is 100.43F (38C) or higher.  Gave patient 24/7 triage line to call if any fevers or symptoms Decreased hemoglobin, part of red blood cells that carry iron and oxygen Fatigue Nausea and Vomiting Hepatotoxicity: reviewed to contact clinic for RUQ pain that will not subside, yellowing of eyes/skin Decreased appetite or weight loss Abdominal pain Decreased platelet count and increased risk for bleeding Venous thromboembolism (VTE): reviewed signs of deep vein thrombosis (DVT) such as leg swelling, redness, pain, or tenderness and signs of pulmonary embolism (PE) such as shortness of breath, rapid or irregular heartbeat, cough, chest pain, or lightheadedness ILD/Pneumonitis: we reviewed potential symptoms including cough, shortness, and fatigue. Handling body fluids and waste Pregnancy, sexual activity, and contraception Avoid grapefruit products Reviewed to take the medication every 12 hours (with food sometimes can be easier on the stomach) and to take it at the same time every day. Discussed proper storage and handling of abemaciclib   Medication Reconciliation Current Outpatient Medications  Medication Sig Dispense Refill   anastrozole  (ARIMIDEX ) 1 MG tablet Take 1 tablet (1 mg total) by mouth daily. 90 tablet 3   Cholecalciferol (VITAMIN D3) 5000 units CAPS Take 5,000 Units by mouth daily.     citalopram  (CELEXA ) 40 MG tablet Take 1 tablet (40 mg total) by mouth daily. 90 tablet 1   Cranberry 500 MG TABS Take 1 tablet by mouth 3 (three) times a week.     abemaciclib  (VERZENIO ) 100 MG tablet Take 1 tablet (100 mg total) by mouth 2 (two) times daily. 60 tablet 3   acetaminophen  (TYLENOL ) 500 MG tablet Take 2 tablets (1,000 mg total) by mouth every 6 (six) hours. 60 tablet 0   ibuprofen  (ADVIL ) 800 MG tablet Take 1 tablet (800 mg total) by mouth every 8 (eight) hours as  needed. 60 tablet 0   LORazepam  (ATIVAN ) 0.5 MG tablet Take 1 tablet (0.5 mg total) by mouth 2 (two) times daily as needed for anxiety. 30 tablet 1   No current facility-administered medications for this visit.    Medication reconciliation is based on the patient's most recent medication list in the electronic medical record (EMR) including herbal products and OTC medications.   The patient's medication list was reviewed  today with the patient? Yes   Drug-drug interactions (DDIs) DDIs were evaluated? Yes Significant DDIs identified? Reviewed increased risk of GI bleed when taking citalopram  and ibuprofen   Drug-Food Interactions Drug-food interactions were evaluated? Yes Drug-food interactions identified? Grapefruit products  Follow-up Plan  Patient education handout given to patient Start abemaciclib  100 mg by mouth every 12 hours. Start date 06/26/24 Continue anastrozole  1 mg by mouth daily Monitor for side effects Distress thermometer completed during in person visit and reviewed with patient. Due to score, social work referral has been sent. Port labs, Dr. Odean visit on 07/10/24 [facility] labs, pharmacy clinic visit in 4 weeks Ann Daugherty can follow up with clinical pharmacy as deemed necessary by Dr. Mackey Odean going forward   Ann Daugherty participated in the discussion, expressed understanding, and voiced agreement with the above plan. All questions were answered to her satisfaction. The patient was advised to contact the clinic at [phone] with any questions or concerns prior to her return visit.   I spent 60 minutes assessing the patient.  Florestine Carmical A. Lucila, PharmD, BCOP, CPP  Norleen DELENA Lucila, RPH-CPP, 06/25/2024 2:57 PM  **Disclaimer: This note was dictated with voice recognition software. Similar sounding words can inadvertently be transcribed and this note may contain transcription errors which may not have been corrected upon publication of note.**

## 2024-06-25 ENCOUNTER — Other Ambulatory Visit (HOSPITAL_COMMUNITY): Payer: Self-pay

## 2024-06-25 ENCOUNTER — Inpatient Hospital Stay: Admitting: Pharmacist

## 2024-06-25 ENCOUNTER — Inpatient Hospital Stay

## 2024-06-25 ENCOUNTER — Encounter: Payer: Self-pay | Admitting: Hematology and Oncology

## 2024-06-25 VITALS — BP 107/60 | HR 84 | Temp 97.7°F | Resp 18 | Wt 136.2 lb

## 2024-06-25 DIAGNOSIS — Z17411 Hormone receptor positive with human epidermal growth factor receptor 2 negative status: Secondary | ICD-10-CM | POA: Diagnosis not present

## 2024-06-25 DIAGNOSIS — Z95828 Presence of other vascular implants and grafts: Secondary | ICD-10-CM

## 2024-06-25 DIAGNOSIS — Z170 Estrogen receptor positive status [ER+]: Secondary | ICD-10-CM

## 2024-06-25 DIAGNOSIS — Z5189 Encounter for other specified aftercare: Secondary | ICD-10-CM | POA: Diagnosis not present

## 2024-06-25 DIAGNOSIS — C50212 Malignant neoplasm of upper-inner quadrant of left female breast: Secondary | ICD-10-CM | POA: Diagnosis not present

## 2024-06-25 MED ORDER — ALTEPLASE 2 MG IJ SOLR
2.0000 mg | Freq: Once | INTRAMUSCULAR | Status: AC
Start: 2024-06-25 — End: 2024-06-25
  Administered 2024-06-25: 2 mg
  Filled 2024-06-25: qty 2

## 2024-06-25 MED ORDER — SODIUM CHLORIDE 0.9% FLUSH
10.0000 mL | Freq: Once | INTRAVENOUS | Status: AC
Start: 2024-06-25 — End: 2024-06-25
  Administered 2024-06-25: 10 mL

## 2024-06-25 NOTE — Progress Notes (Signed)
 Patient counseled in clinic in-person visit note on 06/25/24

## 2024-06-26 ENCOUNTER — Inpatient Hospital Stay: Admitting: Licensed Clinical Social Worker

## 2024-06-26 NOTE — Progress Notes (Signed)
 [facility] Clinical Social Work  Initial Assessment   Ann Daugherty is a 53 y.o. year old female contacted by phone. Clinical Social Work was referred by clinical pharmacist for distress screen needs.   SDOH (Social Determinants of Health) assessments performed: No SDOH Interventions    Flowsheet Row Office Visit from 08/15/2022 in [facility] [employer] Visit from 09/05/2021 in [facility] [place] [employer] Visit from 08/09/2020 in [facility] [place] [employer] Visit from 10/10/2019 in [facility] [place] [employer] Visit from 09/09/2019 in [facility] [place]  SDOH Interventions       Depression Interventions/Treatment  Medication, Counseling Medication, Counseling Medication, Counseling Medication Counseling    SDOH Screenings   Food Insecurity: Food Insecurity Present (02/19/2024)  Housing: Unknown (05/16/2024)   Received from [facility]  Transportation Needs: No Transportation Needs (02/19/2024)  Utilities: Not At Risk (02/19/2024)  Depression (PHQ2-9): Low Risk  (06/19/2024)  Recent Concern: Depression (PHQ2-9) - Medium Risk (04/21/2024)  Tobacco Use: Medium Risk (06/19/2024)    PHQ 2/9:    06/19/2024    1:55 PM 04/21/2024    9:37 AM 07/27/2023    7:44 AM  Depression screen PHQ 2/9  Decreased Interest 0 1 2  Down, Depressed, Hopeless 0 1 2  PHQ - 2 Score 0 2 4  Altered sleeping  1   Tired, decreased energy  2   Change in appetite  1   Feeling bad or failure about yourself   1   Trouble concentrating  1   Moving slowly or fidgety/restless  0   Suicidal thoughts  0   PHQ-9 Score  8      Distress Screen completed: Yes    06/25/2024    2:00 PM  ONCBCN DISTRESS SCREENING  Screening Type Initial Screening  How much distress have you been experiencing in the past week? (0-10) 5      Family/Social Information:   Housing Arrangement: patient lives with her older son Ann Daugherty (he lives in basement apartment).Younger son just started at [employer].  Pt is divorced Family members/support persons in your life? Family- kids, mom Transportation concerns: no  Employment: Out of work due to cancer. Has not applied for disability yet. Previously worked as a Therapist, sports Income source: No income Financial concerns: Yes, current concerns Type of concern: Utilities, Rent/ mortgage, and Designer, industrial/product access concerns: yes, family is helping, but has not applied for [employer] yet Advanced directives: Not known Services Currently in place:  Medicaid-[employer]  Coping/ Adjustment to diagnosis: Patient understands treatment plan and what happens next? yes, started anastrozole  and Verzenio . Has clear understanding of previous and current treatments and follow-up and scans plan moving forward. Pt also has a history of anxiety and depression with more anxiety currently about what to do financially and if she should try to work vs apply for disability and how that might impact health insurance. 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, Actuary, Depression, Anxiety/ nervousness, Facing my mortality, and Body image changes Hopes and/or priorities: priority is to maintain Medicaid insurance to be able to continue treatment Current coping skills/ strengths: Ability for insight , Capable of independent living , Communication skills ,  and Supportive family/friends     SUMMARY: Current SDOH Barriers:  Financial constraints related to inability to work during treatment and Mental Health Concerns (anxiety)  Clinical Social Work Clinical Goal(s):  Demonstrate a reduction in symptoms related to :Anxiety and Depression  Explore  community resource options for unmet needs related to:  Financial Strain   Interventions: Discussed common feeling and emotions when being diagnosed with cancer, and the importance of support during treatment Informed patient of the support team roles and support services at [facility] Provided CSW contact information and encouraged patient to call with any questions or concerns Referred patient to community resources: [facility]  Provided brief mental health counseling with regard to adjustment to each phase of treatment and dealing with Stage IV cancer    Follow Up Plan: Video visit for counseling on 07/11/2024 Patient verbalizes understanding of plan: Yes    Ann Placide E Gertrue Willette, LCSW Clinical Social Worker [facility]  Patient is participating in a Managed Medicaid Plan:  Yes

## 2024-07-04 ENCOUNTER — Other Ambulatory Visit: Payer: Self-pay

## 2024-07-09 NOTE — Assessment & Plan Note (Signed)
 07/19/2023:Palpable left breast mass for 6 months.  Mammogram and ultrasound revealed large masses.   10:30 position: 4.3 cm with skin thickening and ulceration: Grade 2 IDC with high-grade DCIS ER 95%, PR 95%, Ki67 30%, HER2 2+ by IHC, FISH negative ratio 1.37;  5 o'clock position: 3.3 cm with calcifications spanning 6.7 cm, axilla negative, biopsy: Grade 3 IDC with necrosis ER 95% PR 90% Ki-67 60% HER2 1+ negative   CT CAP 08/03/2023: 2 prominent left breast masses, left axillary lymph nodes, right hepatic lobe lesion concerning for metastatic breast cancer Ultrasound a liver biopsy scheduled for 08/17/2023   Treatment plan: Neoadjuvant chemotherapy with dose Adriamycin  and Cytoxan  followed by Taxol  completed 12/27/2023 Mastectomy left breast 01/24/2024 Adjuvant radiation Liver directed therapy (underwent microwave ablation 01/16/2024) Antiestrogen therapy with CDK inhibitors (currently on tamoxifen , will switch to anastrozole  after hysterectomy) ---------------------------------------------------------------------------------------------------- MRI liver 12/26/2023: Significantly diminished size of hepatic metastases (0.6 cm, 1.4 cm, (used to be 3.6 cm), 0.7 cm) MRI breast 12/31/2023: Significant positive response to chemo.  Both left malignancies decrease in size (3 cm with enhancement) and 1.1 cm CT CAP 12/26/2023: Significant decrease in breast masses, resolution of left axillary lymph nodes, decrease liver metastases   01/24/24: Bilateral Mastectomies Right: Benign Left: 2 residual tumors 2.8 cm and 1.6 cm, Margins neg, DCIS, ER 85%, PR: 25%, Her 2: 1+ Neg, Ki 67: 10% 06/04/2023: Hysterectomy and bilateral salpingo-oophorectomy   Current treatment:: Anti-estrogen therapy with Verzenio      Liver MRI 04/12/2024: Stable 6 mm subcapsular left hepatic dome lesion similar, wedge-shaped deformity right hepatic lobe 1 x 2 cm without enhancement (treated metastatic lesion)   Return to clinic 2 weeks  for labs and follow-up

## 2024-07-10 ENCOUNTER — Inpatient Hospital Stay: Attending: Adult Health

## 2024-07-10 ENCOUNTER — Inpatient Hospital Stay: Attending: Adult Health | Admitting: Hematology and Oncology

## 2024-07-10 ENCOUNTER — Ambulatory Visit: Admitting: Obstetrics and Gynecology

## 2024-07-10 VITALS — BP 107/67 | HR 80 | Wt 136.0 lb

## 2024-07-10 VITALS — BP 103/50 | HR 80 | Temp 98.2°F | Resp 17 | Ht 66.0 in | Wt 137.4 lb

## 2024-07-10 DIAGNOSIS — C50212 Malignant neoplasm of upper-inner quadrant of left female breast: Secondary | ICD-10-CM | POA: Insufficient documentation

## 2024-07-10 DIAGNOSIS — Z09 Encounter for follow-up examination after completed treatment for conditions other than malignant neoplasm: Secondary | ICD-10-CM

## 2024-07-10 DIAGNOSIS — Z17411 Hormone receptor positive with human epidermal growth factor receptor 2 negative status: Secondary | ICD-10-CM | POA: Diagnosis not present

## 2024-07-10 DIAGNOSIS — Z170 Estrogen receptor positive status [ER+]: Secondary | ICD-10-CM

## 2024-07-10 DIAGNOSIS — R530 Neoplastic (malignant) related fatigue: Secondary | ICD-10-CM | POA: Insufficient documentation

## 2024-07-10 DIAGNOSIS — N871 Moderate cervical dysplasia: Secondary | ICD-10-CM

## 2024-07-10 DIAGNOSIS — C787 Secondary malignant neoplasm of liver and intrahepatic bile duct: Secondary | ICD-10-CM | POA: Diagnosis not present

## 2024-07-10 LAB — CBC WITH DIFFERENTIAL (CANCER CENTER ONLY)
Abs Immature Granulocytes: 0.02 K/uL (ref 0.00–0.07)
Basophils Absolute: 0 K/uL (ref 0.0–0.1)
Basophils Relative: 1 %
Eosinophils Absolute: 0.2 K/uL (ref 0.0–0.5)
Eosinophils Relative: 7 %
HCT: 33.7 % — ABNORMAL LOW (ref 36.0–46.0)
Hemoglobin: 11.5 g/dL — ABNORMAL LOW (ref 12.0–15.0)
Immature Granulocytes: 1 %
Lymphocytes Relative: 26 %
Lymphs Abs: 0.7 K/uL (ref 0.7–4.0)
MCH: 32.1 pg (ref 26.0–34.0)
MCHC: 34.1 g/dL (ref 30.0–36.0)
MCV: 94.1 fL (ref 80.0–100.0)
Monocytes Absolute: 0.2 K/uL (ref 0.1–1.0)
Monocytes Relative: 8 %
Neutro Abs: 1.6 K/uL — ABNORMAL LOW (ref 1.7–7.7)
Neutrophils Relative %: 57 %
Platelet Count: 194 K/uL (ref 150–400)
RBC: 3.58 MIL/uL — ABNORMAL LOW (ref 3.87–5.11)
RDW: 11.9 % (ref 11.5–15.5)
WBC Count: 2.8 K/uL — ABNORMAL LOW (ref 4.0–10.5)
nRBC: 0 % (ref 0.0–0.2)

## 2024-07-10 LAB — CMP (CANCER CENTER ONLY)
ALT: 12 U/L (ref 0–44)
AST: 15 U/L (ref 15–41)
Albumin: 3.6 g/dL (ref 3.5–5.0)
Alkaline Phosphatase: 85 U/L (ref 38–126)
Anion gap: 5 (ref 5–15)
BUN: 15 mg/dL (ref 6–20)
CO2: 29 mmol/L (ref 22–32)
Calcium: 8.6 mg/dL — ABNORMAL LOW (ref 8.9–10.3)
Chloride: 105 mmol/L (ref 98–111)
Creatinine: 0.8 mg/dL (ref 0.44–1.00)
GFR, Estimated: >60 mL/min (ref >60)
Glucose, Bld: 120 mg/dL — ABNORMAL HIGH (ref 70–99)
Potassium: 4.1 mmol/L (ref 3.5–5.1)
Sodium: 139 mmol/L (ref 135–145)
Total Bilirubin: 0.3 mg/dL (ref 0.0–1.2)
Total Protein: 6.3 g/dL — ABNORMAL LOW (ref 6.5–8.1)

## 2024-07-10 NOTE — Progress Notes (Signed)
 Patient Care Team: [name], MD as PCP - General (Hematology and Oncology) Obgyn, Anna as Consulting Physician (Obstetrics and Gynecology) Onetha Kuba, MD as Consulting Physician (Neurosurgery) Tyree Nanetta SAILOR, RN as Oncology Nurse Navigator Glean, Stephane BROCKS, RN (Inactive) as Oncology Nurse Navigator [name], MD as Consulting Physician (Hematology and Oncology) Aron Shoulders, MD as Consulting Physician (General Surgery) Izell Domino, MD as Attending Physician (Radiation Oncology)  DIAGNOSIS:  Encounter Diagnosis  Name Primary?   Malignant neoplasm of upper-inner quadrant of left breast in female, estrogen receptor positive (HCC) Yes    SUMMARY OF ONCOLOGIC HISTORY: Oncology History  Malignant neoplasm of upper-inner quadrant of left female breast (HCC)  07/19/2023 Initial Diagnosis   Palpable left breast mass for 6 months.  Mammogram and ultrasound revealed large masses.  10:30 position: 4.3 cm with skin thickening and ulceration: Grade 2 IDC with high-grade DCIS ER 95%, PR 95%, Ki67 30%, HER2 2+ by IHC, FISH negative ratio 1.37; 5 o'clock position: 3.3 cm with calcifications spanning 6.7 cm, axilla negative, biopsy: Grade 3 IDC with necrosis ER 95% PR 90% Ki-67 60% HER2 1+ negative   07/26/2023 Cancer Staging   Staging form: Breast, AJCC 8th Edition - Clinical: Stage IIIB (cT4b, cN0, cM0, G3, ER+, PR+, HER2-) - Signed by [name], MD on 07/26/2023 Histologic grading system: 3 grade system   08/15/2023 - 12/27/2023 Chemotherapy   Patient is on Treatment Plan : BREAST ADJUVANT DOSE DENSE AC q14d / PACLitaxel  q7d     08/17/2023 Initial Biopsy   Liver biopsy: consistent with metastatic carcinoma, breast primary.     08/21/2023 Cancer Staging   Staging form: Breast, AJCC 8th Edition - Pathologic: Stage IV (pM1) - Signed by Crawford Morna Pickle, NP on 08/21/2023     CHIEF COMPLIANT: F/U on Verzinio and anastrozole   HISTORY OF PRESENT ILLNESS:   History of  Present Illness Ann Daugherty is a 53 year old female with breast cancer who presents with fatigue and vision changes.  She experiences profound fatigue, which she associates with the loss of estrogen and the initiation of Verzenio  and anastrozole . The fatigue affects her daily activities, causing shortness of breath and necessitating frequent rest. Her hemoglobin is 11.5, and her white blood cell count has decreased from 4 to 2.8.  She has blurry vision, with her contacts being less effective. This change has been noticeable since her hysterectomy and the start of her current medications. She recalls similar vision issues during chemotherapy with steroids, which resolved over time.     ALLERGIES:  has no known allergies.  MEDICATIONS:  Current Outpatient Medications  Medication Sig Dispense Refill   abemaciclib  (VERZENIO ) 100 MG tablet Take 1 tablet (100 mg total) by mouth 2 (two) times daily. 60 tablet 3   acetaminophen  (TYLENOL ) 500 MG tablet Take 2 tablets (1,000 mg total) by mouth every 6 (six) hours. 60 tablet 0   anastrozole  (ARIMIDEX ) 1 MG tablet Take 1 tablet (1 mg total) by mouth daily. 90 tablet 3   Cholecalciferol (VITAMIN D3) 5000 units CAPS Take 5,000 Units by mouth daily.     citalopram  (CELEXA ) 40 MG tablet Take 1 tablet (40 mg total) by mouth daily. 90 tablet 1   Cranberry 500 MG TABS Take 1 tablet by mouth 3 (three) times a week.     ibuprofen  (ADVIL ) 800 MG tablet Take 1 tablet (800 mg total) by mouth every 8 (eight) hours as needed. 60 tablet 0   LORazepam  (ATIVAN ) 0.5 MG tablet  Take 1 tablet (0.5 mg total) by mouth 2 (two) times daily as needed for anxiety. 30 tablet 1   No current facility-administered medications for this visit.    PHYSICAL EXAMINATION: ECOG PERFORMANCE STATUS: 1 - Symptomatic but completely ambulatory  Vitals:   07/10/24 0805 07/10/24 0806  BP: (!) 97/45 (!) 103/50  Pulse: 80   Resp: 17   Temp: 98.2 F (36.8 C)   SpO2: 99%     Filed Weights   07/10/24 0805  Weight: 137 lb 6.4 oz (62.3 kg)      LABORATORY DATA:  I have reviewed the data as listed    Latest Ref Rng & Units 06/10/2024    3:04 PM 01/16/2024    8:19 AM 12/27/2023    9:30 AM  CMP  Glucose 70 - 99 mg/dL 96  893  899   BUN 6 - 20 mg/dL 12  14  11    Creatinine 0.44 - 1.00 mg/dL 9.36  9.37  9.39   Sodium 135 - 145 mmol/L 138  138  136   Potassium 3.5 - 5.1 mmol/L 4.0  3.8  4.1   Chloride 98 - 111 mmol/L 104  104  104   CO2 22 - 32 mmol/L 30  27  28    Calcium 8.9 - 10.3 mg/dL 8.8  9.0  9.0   Total Protein 6.5 - 8.1 g/dL 6.7  6.6  6.2   Total Bilirubin 0.0 - 1.2 mg/dL 0.3  0.5  0.2   Alkaline Phos 38 - 126 U/L 60  56  68   AST 15 - 41 U/L 13  21  14    ALT 0 - 44 U/L 10  18  11      Lab Results  Component Value Date   WBC 2.8 (L) 07/10/2024   HGB 11.5 (L) 07/10/2024   HCT 33.7 (L) 07/10/2024   MCV 94.1 07/10/2024   PLT 194 07/10/2024   NEUTROABS 1.6 (L) 07/10/2024    ASSESSMENT & PLAN:  Malignant neoplasm of upper-inner quadrant of left female breast (HCC) 07/19/2023:Palpable left breast mass for 6 months.  Mammogram and ultrasound revealed large masses.   10:30 position: 4.3 cm with skin thickening and ulceration: Grade 2 IDC with high-grade DCIS ER 95%, PR 95%, Ki67 30%, HER2 2+ by IHC, FISH negative ratio 1.37;  5 o'clock position: 3.3 cm with calcifications spanning 6.7 cm, axilla negative, biopsy: Grade 3 IDC with necrosis ER 95% PR 90% Ki-67 60% HER2 1+ negative   CT CAP 08/03/2023: 2 prominent left breast masses, left axillary lymph nodes, right hepatic lobe lesion concerning for metastatic breast cancer Ultrasound a liver biopsy scheduled for 08/17/2023   Treatment plan: Neoadjuvant chemotherapy with dose Adriamycin  and Cytoxan  followed by Taxol  completed 12/27/2023 Mastectomy left breast 01/24/2024 Adjuvant radiation completed 04/16/24 Liver directed therapy (underwent microwave ablation 01/16/2024) Antiestrogen therapy with CDK  inhibitors started 06/25/24 ---------------------------------------------------------------------------------------------------- MRI liver 12/26/2023: Significantly diminished size of hepatic metastases (0.6 cm, 1.4 cm, (used to be 3.6 cm), 0.7 cm) MRI breast 12/31/2023: Significant positive response to chemo.  Both left malignancies decrease in size (3 cm with enhancement) and 1.1 cm CT CAP 12/26/2023: Significant decrease in breast masses, resolution of left axillary lymph nodes, decrease liver metastases   01/24/24: Bilateral Mastectomies Right: Benign Left: 2 residual tumors 2.8 cm and 1.6 cm, Margins neg, DCIS, ER 85%, PR: 25%, Her 2: 1+ Neg, Ki 67: 10% 06/04/2023: Hysterectomy and bilateral salpingo-oophorectomy   Current treatment:: Anti-estrogen therapy with Verzenio  Adverse  effects: Mild diarrhea: Managing well. Will keep her at 100 mg bid dose Neuropenia: Monitoring for now Severe fatigue: Advised to take B 12 supplements   Liver MRI 04/12/2024: Stable 6 mm subcapsular left hepatic dome lesion similar, wedge-shaped deformity right hepatic lobe 1 x 2 cm without enhancement (treated metastatic lesion)   Return to clinic 2 weeks for labs and follow-up with Norleen. After that I will see her a month later  ------------------------------------- Assessment and Plan Assessment & Plan ER-positive malignant neoplasm of upper-inner quadrant of left breast Currently on Verzenio  and anastrozole , tolerating medications at doses of 100 mg and 150 mg, respectively. Decision to maintain current dosage due to mild symptoms. Experiencing fatigue likely related to estrogen loss and new medications. Hemoglobin slightly low at 11.5 g/dL, not significantly impacting. White blood cell count decreased from 4 to 2.8 x10^9/L, expected, with sufficient neutrophils. - Continue Verzenio  and anastrozole  at current doses.  Fatigue related to cancer therapy Reports profound fatigue, likely due to estrogen loss and new  medications. Expected to improve after the first month of treatment. Hemoglobin levels slightly low but not significantly contributing to fatigue. Fatigue likely a side effect of medication rather than a specific lab issue. - Encourage gradual return to exercise, starting slow and increasing intensity. - Consider B12 supplement, 1000 micrograms daily, sublingual.  Gastrointestinal symptoms related to cancer therapy (abdominal distension and diarrhea) Experiences nocturnal cramps and gas, sometimes with diarrhea. Keeping a food diary to identify triggers. Symptoms not as severe as anticipated, able to tolerate dairy and certain fruits. - Consider taking Gas-X with evening dose to alleviate gas symptoms. - Continue keeping a food diary to identify potential triggers.  Vision changes under evaluation Reports blurry vision since hysterectomy and starting new medications. Could be due to eye fatigue, similar to body fatigue. Vision changes observed during and after chemotherapy. - Wait one month and then make an appointment with an ophthalmologist to assess current vision baseline.      No orders of the defined types were placed in this encounter.  The patient has a good understanding of the overall plan. she agrees with it. she will call with any problems that may develop before the next visit here. Total time spent: 30 mins including face to face time and time spent for planning, charting and co-ordination of care   Viinay K Jamesyn Moorefield, MD 07/10/24

## 2024-07-10 NOTE — Progress Notes (Signed)
   POST OPERATIVE GYNECOLOGY VISIT  Subjective:  Ann Daugherty is a 53 y.o. G4P0 5 weeks s/p uncomplicated TLH/BSO/cysto for hx metastatic ER+/PR+ breast ca and hx tamoxifen  use presenting for post op follow up  Path with CIN2 in cervix but otherwise benign uterus/tubes/ovaries  No pain or bleeding. Tolerating diet. Having a lot of issues w/ fatigue - attributes this to combination of surgery, new oral chemo, less physical activity.  Got her tattoo! Beautiful pink ribbon with hummingbird and flowers representing growing through her experience.   Objective:   Vitals:   07/10/24 0908  BP: 107/67  Pulse: 80  Weight: 136 lb (61.7 kg)   General:  Alert, oriented and cooperative. Patient is in no acute distress.  Skin: Skin is warm and dry. No rash noted.   Cardiovascular: Normal heart rate noted  Respiratory: Normal respiratory effort, no problems with respiration noted  Abdomen: Soft, non-tender, non-distended   Pelvic: NEFG. Cuff well healed, visibly and palpably intact  Exam performed in the presence of a chaperone  Assessment and Plan:  Ann Daugherty is a 53 y.o. 5wk s/p TLH/BSO/cysto  Postoperative examination Recovering appropriately Continue pelvic rest until 12 weeks post op OK to resume physical activity - plans to return to water aerobics and lifting light weights  Dysplasia of cervix, high grade CIN 2 Will need annual HPV test x 3 years, then q3 years if normal/negative  Future Appointments  Date Time Provider Department Center  07/11/2024 10:00 AM Helane Rosaline BRAVO, LCSW [facility] None  07/23/2024  8:30 AM [facility] FLUSH [facility] None  07/23/2024  9:00 AM Lucila Norleen LABOR, RPH-CPP [facility] None  11/03/2024  8:30 AM [facility]USH [facility] None  11/03/2024  9:00 AM Causey, Morna Pickle, NP [facility] None   Kieth JAYSON Carolin, MD

## 2024-07-11 ENCOUNTER — Inpatient Hospital Stay: Admitting: Licensed Clinical Social Worker

## 2024-07-11 ENCOUNTER — Telehealth: Payer: Self-pay | Admitting: Licensed Clinical Social Worker

## 2024-07-11 ENCOUNTER — Encounter: Admitting: Adult Health

## 2024-07-11 NOTE — Progress Notes (Unsigned)
 [facility] Clinical Social Work  Patient did not sign on for virtual counseling visit. CSW attempted to call patient. No answer. Left VM asking pt to call back to reschedule.   Ann Daugherty E Beckett Hickmon, LCSW

## 2024-07-11 NOTE — Telephone Encounter (Signed)
 [facility] Clinical Social Work  Patient did not sign on for virtual counseling visit. CSW attempted to contact pt by phone to complete visit. No answer. Left VM with direct contact information for pt to call back to reschedule.   Brandon Scarbrough E Kalaysia Demonbreun, LCSW

## 2024-07-15 ENCOUNTER — Other Ambulatory Visit: Payer: Self-pay

## 2024-07-16 ENCOUNTER — Other Ambulatory Visit: Payer: Self-pay

## 2024-07-16 ENCOUNTER — Other Ambulatory Visit: Payer: Self-pay | Admitting: Pharmacy Technician

## 2024-07-16 NOTE — Progress Notes (Signed)
 Specialty Pharmacy Ongoing Clinical Assessment Note  Ann Daugherty is a 53 y.o. female who is being followed by the specialty pharmacy service for RxSp Oncology   Patient's specialty medication(s) reviewed today: Abemaciclib  (VERZENIO )   Missed doses in the last 4 weeks: 0   Patient/Caregiver did not have any additional questions or concerns.   Therapeutic benefit summary: Unable to assess   Adverse events/side effects summary: Experienced adverse events/side effects (endorse alternating diarrhea/constipation, and moderate fatigue but feels both are very manageable)   Patient's therapy is appropriate to: Continue    Goals Addressed             This Visit's Progress    Maintain optimal adherence to therapy   On track    Patient is on track. Patient will maintain adherence         Follow up: 3 months  [facility] Specialty Pharmacist

## 2024-07-16 NOTE — Progress Notes (Signed)
 Specialty Pharmacy Refill Coordination Note  MARGET OUTTEN is a 53 y.o. female contacted today regarding refills of specialty medication(s) Abemaciclib  (VERZENIO )   Patient requested [name] at [facility] [place] date: 07/23/24   Medication will be filled on 07/22/24.

## 2024-07-18 ENCOUNTER — Other Ambulatory Visit: Payer: Self-pay | Admitting: Interventional Radiology

## 2024-07-18 DIAGNOSIS — Z419 Encounter for procedure for purposes other than remedying health state, unspecified: Secondary | ICD-10-CM | POA: Diagnosis not present

## 2024-07-18 DIAGNOSIS — C787 Secondary malignant neoplasm of liver and intrahepatic bile duct: Secondary | ICD-10-CM

## 2024-07-22 ENCOUNTER — Other Ambulatory Visit: Payer: Self-pay

## 2024-07-22 NOTE — Progress Notes (Unsigned)
 [facility]       Telephone: [phone]?Fax: [phone]   Oncology Clinical Pharmacist Practitioner Progress Note  Ann Daugherty was contacted via in-person to discuss her chemotherapy regimen for abemaciclib  which they receive under the care of Dr. Vinay Gudena.  Current treatment regimen and start date Abemaciclib  (06/26/24) Anastrozole  (06/19/24)  Interval History She continues on abemaciclib  100 mg by mouth every 12 hours on days 1 to 28 of a 28-day cycle. This is being given in combination with anastrozole . Therapy is planned to continue until disease progression or unacceptable toxicity. She was last seen by Dr. Odean on 07/10/24 and clinical pharmacy on 06/25/24. Her ANC was 1600 cells/uL at her last visit with Dr. Odean and she was experiencing fatigue. Dr. Gudena kept her dose at 100 mg PO BID because of these two side effects.  Response to Therapy [name] is doing well. She continues to have some fatigue where she is resting. She has also found that taking 0.5 tablets of loperamide and colace PRN works for her bowel regimen. She also takes Gas-X at night to help with bloating.  Her ANC has decreased so we will continue to monitor. Hemoglobin and platelets. She did miss one abemaciclib  as she slept late.  Labs, vitals, treatment parameters, and manufacturer guidelines assessing toxicity were reviewed with Ann Daugherty today. Based on these values, patient is in agreement to continue abemaciclib  therapy at this time.  Allergies No Known Allergies  Vitals    07/23/2024    9:04 AM 07/10/2024    9:08 AM 07/10/2024    8:06 AM  Oncology Vitals  Weight 62.778 kg 61.689 kg   Weight (lbs) 138 lbs 6 oz 136 lbs   BMI 22.34 kg/m2 21.95 kg/m2   Temp 97.7 F (36.5 C)    Pulse Rate 81 80   BP 93/62 107/67 103/50  Resp 17    SpO2 98 %    BSA (m2) 1.71 m2 1.7 m2     Laboratory Data    Latest Ref Rng & Units 07/23/2024    8:32 AM 07/10/2024    7:40 AM  06/10/2024    3:04 PM  CBC EXTENDED  WBC 4.0 - 10.5 K/uL 2.2  2.8  4.2   RBC 3.87 - 5.11 MIL/uL 3.83  3.58  3.88   Hemoglobin 12.0 - 15.0 g/dL 87.8  88.4  87.8   HCT 36.0 - 46.0 % 36.3  33.7  36.0   Platelets 150 - 400 K/uL 254  194  288   NEUT# 1.7 - 7.7 K/uL 1.3  1.6  3.0   Lymph# 0.7 - 4.0 K/uL 0.6  0.7  0.7        Latest Ref Rng & Units 07/23/2024    8:32 AM 07/10/2024    7:40 AM 06/10/2024    3:04 PM  CMP  Glucose 70 - 99 mg/dL 897  879  96   BUN 6 - 20 mg/dL 14  15  12    Creatinine 0.44 - 1.00 mg/dL 9.21  9.19  9.36   Sodium 135 - 145 mmol/L 139  139  138   Potassium 3.5 - 5.1 mmol/L 4.0  4.1  4.0   Chloride 98 - 111 mmol/L 103  105  104   CO2 22 - 32 mmol/L 30  29  30    Calcium 8.9 - 10.3 mg/dL 9.4  8.6  8.8   Total Protein 6.5 - 8.1 g/dL 7.0  6.3  6.7   Total Bilirubin 0.0 - 1.2 mg/dL 0.2  0.3  0.3   Alkaline Phos 38 - 126 U/L 90  85  60   AST 15 - 41 U/L 17  15  13    ALT 0 - 44 U/L 14  12  10      Adverse Effects Assessment ANC: decreased. Monitor Hgb: WNL Platelets: WNL Fatigue: still occurring. Resting when able. Monitor GI toxicities: controlled as above. Calcium: improved. Continues on D3 daily and supplements with Tums when needed based on nutritional daily calcium intake  Adherence Assessment ASHA GRUMBINE reports missing 1 doses over the past 2 weeks.   Reason for missed dose: slept late Patient was re-educated on importance of adherence.   Access Assessment Ann Daugherty is currently receiving her abemaciclib  through [facility] concerns:  none  Medication Reconciliation The patient's medication list was reviewed today with the patient? Yes New medications or herbal supplements have recently been started? No  Any medications have been discontinued? No  The medication list was updated and reconciled based on the patient's most recent medication list in the electronic medical record (EMR) including herbal products and  OTC medications.   Medications Current Outpatient Medications  Medication Sig Dispense Refill   abemaciclib  (VERZENIO ) 100 MG tablet Take 1 tablet (100 mg total) by mouth 2 (two) times daily. 60 tablet 3   acetaminophen  (TYLENOL ) 500 MG tablet Take 2 tablets (1,000 mg total) by mouth every 6 (six) hours. 60 tablet 0   anastrozole  (ARIMIDEX ) 1 MG tablet Take 1 tablet (1 mg total) by mouth daily. 90 tablet 3   Cholecalciferol (VITAMIN D3) 5000 units CAPS Take 5,000 Units by mouth daily.     citalopram  (CELEXA ) 40 MG tablet Take 1 tablet (40 mg total) by mouth daily. 90 tablet 1   Cranberry 500 MG TABS Take 1 tablet by mouth 3 (three) times a week.     LORazepam  (ATIVAN ) 0.5 MG tablet Take 1 tablet (0.5 mg total) by mouth 2 (two) times daily as needed for anxiety. 30 tablet 1   No current facility-administered medications for this visit.    Drug-Drug Interactions (DDIs) DDIs were evaluated? Yes Significant DDIs? No  The patient was instructed to speak with their health care provider and/or the oral chemotherapy pharmacist before starting any new drug, including prescription or over the counter, natural / herbal products, or vitamins.  Supportive Care Diarrhea: we reviewed that diarrhea is common with abemaciclib  and confirmed that she does have loperamide (Imodium) at home.  We reviewed how to take this medication PRN. Neutropenia: we discussed the importance of having a thermometer and what the Centers for Disease Control and Prevention (CDC) considers a fever which is 100.38F (38C) or higher.  Gave patient 24/7 triage line to call if any fevers or symptoms. ILD/Pneumonitis: we reviewed potential symptoms including cough, shortness, and fatigue.  VTE: reviewed signs of DVT such as leg swelling, redness, pain, or tenderness and signs of PE such as shortness of breath, rapid or irregular heartbeat, cough, chest pain, or lightheadedness. Reviewed to take the medication every 12 hours (with food  sometimes can be easier on the stomach) and to take it at the same time every day. Hepatotoxicity:WNL Drug interactions with grapefruit products  Dosing Assessment Hepatic adjustments needed? No  Renal adjustments needed? No  Toxicity adjustments needed? No  The current dosing regimen is appropriate to continue at this time.  Follow-Up Plan Continue abemaciclib  100 mg  by mouth every 12 hours Continue anastrozole  1 mg by mouth daily Monitor for side effects: ANC, fatigue, loose stool Restaging scans to be ordered for expected date of 09/26/24 (started abemaciclib  on 06/26/24) Will add labs, pharmacy clinic visit in two weeks Labs, Dr. Odean visit scheduled for 08/25/24 Comer JINNY Needy can follow up with clinical pharmacy as deemed necessary by Dr. Mackey Odean going forward   Comer JINNY Needy participated in the discussion, expressed understanding, and voiced agreement with the above plan. All questions were answered to her satisfaction. The patient was advised to contact the clinic at [phone] with any questions or concerns prior to her return visit.   I spent 30 minutes assessing and educating the patient.  English Tomer A. Lucila, PharmD, BCOP, CPP  Norleen DELENA Lucila, RPH-CPP, 07/23/2024  9:45 AM   **Disclaimer: This note was dictated with voice recognition software. Similar sounding words can inadvertently be transcribed and this note may contain transcription errors which may not have been corrected upon publication of note.**

## 2024-07-23 ENCOUNTER — Inpatient Hospital Stay: Admitting: Pharmacist

## 2024-07-23 ENCOUNTER — Inpatient Hospital Stay

## 2024-07-23 VITALS — BP 93/62 | HR 81 | Temp 97.7°F | Resp 17 | Wt 138.4 lb

## 2024-07-23 DIAGNOSIS — C50212 Malignant neoplasm of upper-inner quadrant of left female breast: Secondary | ICD-10-CM | POA: Diagnosis not present

## 2024-07-23 DIAGNOSIS — Z170 Estrogen receptor positive status [ER+]: Secondary | ICD-10-CM

## 2024-07-23 DIAGNOSIS — R530 Neoplastic (malignant) related fatigue: Secondary | ICD-10-CM | POA: Diagnosis not present

## 2024-07-23 DIAGNOSIS — C787 Secondary malignant neoplasm of liver and intrahepatic bile duct: Secondary | ICD-10-CM | POA: Diagnosis not present

## 2024-07-23 DIAGNOSIS — Z17411 Hormone receptor positive with human epidermal growth factor receptor 2 negative status: Secondary | ICD-10-CM | POA: Diagnosis not present

## 2024-07-23 LAB — CBC WITH DIFFERENTIAL (CANCER CENTER ONLY)
Abs Immature Granulocytes: 0 K/uL (ref 0.00–0.07)
Basophils Absolute: 0 K/uL (ref 0.0–0.1)
Basophils Relative: 1 %
Eosinophils Absolute: 0 K/uL (ref 0.0–0.5)
Eosinophils Relative: 0 %
HCT: 36.3 % (ref 36.0–46.0)
Hemoglobin: 12.1 g/dL (ref 12.0–15.0)
Immature Granulocytes: 0 %
Lymphocytes Relative: 28 %
Lymphs Abs: 0.6 K/uL — ABNORMAL LOW (ref 0.7–4.0)
MCH: 31.6 pg (ref 26.0–34.0)
MCHC: 33.3 g/dL (ref 30.0–36.0)
MCV: 94.8 fL (ref 80.0–100.0)
Monocytes Absolute: 0.2 K/uL (ref 0.1–1.0)
Monocytes Relative: 10 %
Neutro Abs: 1.3 K/uL — ABNORMAL LOW (ref 1.7–7.7)
Neutrophils Relative %: 61 %
Platelet Count: 254 K/uL (ref 150–400)
RBC: 3.83 MIL/uL — ABNORMAL LOW (ref 3.87–5.11)
RDW: 12.7 % (ref 11.5–15.5)
WBC Count: 2.2 K/uL — ABNORMAL LOW (ref 4.0–10.5)
nRBC: 0 % (ref 0.0–0.2)

## 2024-07-23 LAB — CMP (CANCER CENTER ONLY)
ALT: 14 U/L (ref 0–44)
AST: 17 U/L (ref 15–41)
Albumin: 4.2 g/dL (ref 3.5–5.0)
Alkaline Phosphatase: 90 U/L (ref 38–126)
Anion gap: 6 (ref 5–15)
BUN: 14 mg/dL (ref 6–20)
CO2: 30 mmol/L (ref 22–32)
Calcium: 9.4 mg/dL (ref 8.9–10.3)
Chloride: 103 mmol/L (ref 98–111)
Creatinine: 0.78 mg/dL (ref 0.44–1.00)
GFR, Estimated: >60 mL/min (ref >60)
Glucose, Bld: 102 mg/dL — ABNORMAL HIGH (ref 70–99)
Potassium: 4 mmol/L (ref 3.5–5.1)
Sodium: 139 mmol/L (ref 135–145)
Total Bilirubin: 0.2 mg/dL (ref 0.0–1.2)
Total Protein: 7 g/dL (ref 6.5–8.1)

## 2024-07-24 ENCOUNTER — Other Ambulatory Visit (HOSPITAL_COMMUNITY): Payer: Self-pay

## 2024-07-24 LAB — CANCER ANTIGEN 27.29: CA 27.29: 25.9 U/mL (ref 0.0–38.6)

## 2024-07-25 ENCOUNTER — Ambulatory Visit (HOSPITAL_COMMUNITY)
Admission: RE | Admit: 2024-07-25 | Discharge: 2024-07-25 | Disposition: A | Discharge status: Home / Self Care | Source: Ambulatory Visit | Attending: Interventional Radiology | Admitting: Interventional Radiology

## 2024-07-25 DIAGNOSIS — C787 Secondary malignant neoplasm of liver and intrahepatic bile duct: Secondary | ICD-10-CM | POA: Diagnosis not present

## 2024-07-25 DIAGNOSIS — K769 Liver disease, unspecified: Secondary | ICD-10-CM | POA: Diagnosis not present

## 2024-07-25 MED ORDER — GADOBUTROL 1 MMOL/ML IV SOLN
6.0000 mL | Freq: Once | INTRAVENOUS | Status: AC | PRN
  Administered 2024-07-25: 6 mL via INTRAVENOUS

## 2024-07-29 ENCOUNTER — Ambulatory Visit
Admission: RE | Admit: 2024-07-29 | Discharge: 2024-07-29 | Disposition: A | Discharge status: Home / Self Care | Source: Ambulatory Visit | Attending: Interventional Radiology | Admitting: Interventional Radiology

## 2024-07-29 DIAGNOSIS — C787 Secondary malignant neoplasm of liver and intrahepatic bile duct: Secondary | ICD-10-CM

## 2024-07-29 HISTORY — PX: IR RADIOLOGIST EVAL & MGMT: IMG5224

## 2024-07-29 NOTE — Progress Notes (Signed)
 This encounter was conducted via the [employer] providing interactive audio and visual communication.  The patient provided verbal consent to conduct a virtual appointment.  The patient was located at their primary residence during this encounter.   Chief Complaint: Patient was seen in consultation today for breast cancer metastatic to the liver at the request of Brinlynn Gorton K  Referring Physician(s): Courtni Balash K  History of Present Illness: Ann Daugherty is a 53 y.o. female with a history of left breast cancer stage IV -  (cT4b, cN0, cM1, G3, ER+, PR+, HER2-).  Ann Daugherty has lesions in her liver and underwent biopsy of one of the liver lesions in October 2024 confirming metastatic carcinoma from breast primary.   Ann Daugherty has recently completed neoadjuvant chemotherapy with Adriamycin  and Cytoxan  followed by Taxol  completed in February 2025.   Ann Daugherty is now a candidate for potential liver directed therapy.  The dominant lesion in segment 6 has regressed from 3.6 cm to 1.4 cm and is an excellent candidate for percutaneous microwave ablation.   There is a smaller subcentimeter lesion in the anterior dome which is questionable and warrants close observation.   Ann Daugherty had a technically successful percutaneous thermal ablation of the segment 6 lesion on 01/16/2024.  The procedure went well and without complication and Ann Daugherty was discharged home the same day.   MRI 04/12/24 - Nonenhancing area in the right hepatic lobe, segment 6 measuring 1.0 x 2.0 cm. There is no abnormal enhancement. Findings favor treated metastatic lesion. - There is a stable approximately 6 x 6 mm enhancing lesion in the subcapsular left hepatic dome, segment 4A, which appears grossly similar to the prior study. No new suspicious liver lesions seen.  MRI 07/25/24 1. There is a 1.0 x 1.4 cm subcapsular lesion in the left hepatic lobe, segment 4A, which has increased in size since the prior study and is concerning for a  solitary liver metastasis. 2. Redemonstration of peripheral wedge-shaped nonenhancing area in the right hepatic lobe, segment 6, which is favored to represent involuting treated lesion. No local recurrence noted. 3. No other metastatic disease identified within the abdomen.   We had a video conference visit today for her 6 month follow-up evaluation.  Ann Daugherty has started her new maintenance chemotherapy and so far is tolerating it well.  Ann Daugherty expressed some concern over the new MRI findings of the enlarging lesion.  I reassured her and described a plan to proceed with further evaluation and ultimately treatment.  Given the very challenging location of this lesion, percutaneous thermal ablation may not be possible.  However, I suspect that a Y90 segmentectomy would offer a similar result.  I will schedule to have her come in for a pre-Y 90 planning arteriogram.  In the same setting, I will also carefully evaluate her liver with ultrasound to look for a percutaneous approach for microwave ablation.  If the lesion can be visualized and approach, then we will proceed with percutaneous microwave ablation.  If the lesion is not identifiable or approachable, then the arteriogram will tell us  if there is a clear feeding vessel that would allow for Y90 segmentectomy.  If there is no good feeding vessel or the lesion is not identifiable on angiography, then we will continue surveillance given that Ann Daugherty is on a new oral chemotherapy medication and reassess via MRI in 3 months.  Ann Daugherty voiced her understanding and acceptance of this plan.  Ann Daugherty seems relieved.  Past Medical History:  Diagnosis Date   ADD (attention  deficit disorder)    Anemia    Breast cancer metastasized to liver, unspecified laterality (HCC) 08/2023   01-16-2024 s/p microwave ablation large liver lesion w/ residual small lesion   Depression    GAD (generalized anxiety disorder)    GERD (gastroesophageal reflux disease)    History of antineoplastic  chemotherapy 08/2023   08-15-2023  to  20-20-2025   History of external beam radiation therapy 01/24/2024   left chest wall   s/p bilateral mastectomy   01-24-2024  to  04-16-2024   Malignant neoplasm of upper-inner quadrant of left breast in female, estrogen receptor positive (HCC) 07/2023   oncologist-- dr catrina gyn oncology--- dr forsyth/ surgeon -- dr aron;  dx 07/2023  HGDCIS/ ICDG2; liver bx confirmed mets ; Stage IV;  completed chemo 02/ 2025; 03/ 2025 s/p bilateral mastectomy; completed IMRT 04-16-2024; scheduled for Hystectomy w/ BSO 06-03-2024   Pap smear abnormality of cervix with LGSIL 05/20/2024   w/ + HPV   PONV (postoperative nausea and vomiting)    Port-A-Cath in place 08/14/2023    Past Surgical History:  Procedure Laterality Date   BREAST BIOPSY Left 07/19/2023   US  LT BREAST BX W LOC DEV EA ADD LESION IMG BX SPEC US  GUIDE 07/19/2023 [facility]   BREAST BIOPSY Left 07/19/2023   US  LT BREAST BX W LOC DEV 1ST LESION IMG BX SPEC US  GUIDE 07/19/2023 [facility]   BREAST BIOPSY Right 08/09/2023   US  RT BREAST BX W LOC DEV 1ST LESION IMG BX SPEC US  GUIDE 08/09/2023 [facility]   BREAST BIOPSY Right 08/09/2023   US  RT BREAST BX W LOC DEV EA ADD LESION IMG BX SPEC US  GUIDE 08/09/2023 [facility]   CYSTOSCOPY N/A 06/03/2024   Procedure: CYSTOSCOPY;  Surgeon: Erik Kieth BROCKS, MD;  Location: [facility] OR;  Service: Gynecology;  Laterality: N/A;   DILATION AND CURETTAGE OF UTERUS  1990   IR GUIDED LIVER TUMOR ABLATION RFA PERC     IR RADIOLOGIST EVAL & MGMT  01/08/2024   IR RADIOLOGIST EVAL & MGMT  01/31/2024   IR RADIOLOGIST EVAL & MGMT  04/17/2024   LAMINECTOMY AND MICRODISCECTOMY LUMBAR SPINE  2011   by dr onetha;   re-do L4--5   LUMBAR LAMINECTOMY/DECOMPRESSION WITH DISCECTOMY  09/17/2006   @[facility]  by dr onetha;   L4-5   PORTACATH PLACEMENT Right 08/14/2023   Procedure: INSERTION PORT-A-CATH WITH ULTRASOUND ANDREA;  Surgeon: aron Shoulders, MD;   Location: [facility] OR;  Service: General;  Laterality: Right;   SIMPLE MASTECTOMY WITH AXILLARY SENTINEL NODE BIOPSY Bilateral 01/24/2024   Procedure: BILATERAL MASTECTOMY;  Surgeon: aron Shoulders, MD;  Location: [facility];  Service: General;  Laterality: Bilateral;  PEC BLOCK   TOTAL LAPAROSCOPIC HYSTERECTOMY WITH BILATERAL SALPINGO OOPHORECTOMY Bilateral 06/03/2024   Procedure: HYSTERECTOMY, TOTAL, LAPAROSCOPIC, WITH BILATERAL SALPINGO-OOPHORECTOMY;  Surgeon: Erik Kieth BROCKS, MD;  Location[facility];  Service: Gynecology;  Laterality: Bilateral;  TLH/BSO/cysto    Allergies: Patient has no known allergies.  Medications: Prior to Admission medications   Medication Sig Start Date End Date Taking? Authorizing Provider  abemaciclib  (VERZENIO ) 100 MG tablet Take 1 tablet (100 mg total) by mouth 2 (two) times daily. 06/19/24   Gudena, Vinay, MD  acetaminophen  (TYLENOL ) 500 MG tablet Take 2 tablets (1,000 mg total) by mouth every 6 (six) hours. 06/03/24   Erik Kieth BROCKS, MD  anastrozole  (ARIMIDEX ) 1 MG tablet Take 1 tablet (1 mg total) by mouth daily. 06/19/24   Crawford Morna Pickle,  NP  Cholecalciferol (VITAMIN D3) 5000 units CAPS Take 5,000 Units by mouth daily. 12/07/16   [provider]  citalopram  (CELEXA ) 40 MG tablet Take 1 tablet (40 mg total) by mouth daily. 04/22/24   Causey, Morna Pickle, NP  Cranberry 500 MG TABS Take 1 tablet by mouth 3 (three) times a week.    [provider]  LORazepam  (ATIVAN ) 0.5 MG tablet Take 1 tablet (0.5 mg total) by mouth 2 (two) times daily as needed for anxiety. 02/14/24   [name], MD     Family History  Problem Relation Age of Onset   Breast cancer Mother 52   Colon cancer Mother 30   Asthma Mother    Hypertension Mother    Hearing loss Mother    Hypertension Father    Diabetes Father    Hyperlipidemia Father    Learning disabilities Son    ADD / ADHD Son    Ovarian cancer Maternal Grandmother     Hypertension Maternal Grandfather    Hyperlipidemia Maternal Grandfather    Heart disease Maternal Grandfather    Stroke Maternal Grandfather    Mental illness Paternal Grandmother    Hypertension Paternal Grandfather     Social History   Socioeconomic History   Marital status: Divorced    Spouse name: Not on file   Number of children: 2   Years of education: Not on file   Highest education level: Not on file  Occupational History   Not on file  Tobacco Use   Smoking status: Former    Current packs/day: 0.00    Average packs/day: 0.5 packs/day for 30.0 years (15.0 ttl pk-yrs)    Types: Cigarettes    Start date: 04/07/1991    Quit date: 04/06/2021    Years since quitting: 3.3   Smokeless tobacco: Never  Vaping Use   Vaping status: Every Day   Substances: Nicotine   Devices: jewel  (pt stated started in 2022)  Substance and Sexual Activity   Alcohol use: Not Currently   Drug use: Not Currently    Comment: 05-28-2024  CBD gummies , last time 2022   Sexual activity: Not Currently    Birth control/protection: None  Other Topics Concern   Not on file  Social History Narrative   2  sons: 89 y.o. and 32 y.o.    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   Social Drivers of Health   Financial Resource Strain: Not on file  Food Insecurity: Food Insecurity Present (02/19/2024)   Hunger Vital Sign    Worried About Running Out of Food in the Last Year: Sometimes true    Ran Out of Food in the Last Year: Sometimes true  Transportation Needs: No Transportation Needs (02/19/2024)   PRAPARE - Administrator, Civil Service (Medical): No    Lack of Transportation (Non-Medical): No  Physical Activity: Not on file  Stress: Not on file  Social Connections: Not on file    ECOG Status: 0 - Asymptomatic  Review of Systems: A 12 point ROS discussed and pertinent positives are indicated in the HPI above.  All other systems are negative.  Review of Systems  Vital Signs: There were  no vitals taken for this visit.    Physical Exam Constitutional:      General: Ann Daugherty is not in acute distress.    Appearance: Normal appearance. Ann Daugherty is normal weight. Ann Daugherty is not toxic-appearing.  HENT:     Head: Normocephalic and atraumatic.  Eyes:  General: No scleral icterus. Pulmonary:     Effort: Pulmonary effort is normal.  Neurological:     Mental Status: Ann Daugherty is alert and oriented to person, place, and time.  Psychiatric:        Mood and Affect: Mood normal.        Behavior: Behavior normal.       Imaging: MR ABDOMEN WWO CONTRAST Result Date: 07/25/2024 CLINICAL DATA:  History provided by technologist Metastasis to liver. EXAM: MRI ABDOMEN WITHOUT AND WITH CONTRAST TECHNIQUE: Multiplanar multisequence MR imaging of the abdomen was performed both before and after the administration of intravenous contrast. CONTRAST:  6mL GADAVIST  GADOBUTROL  1 MMOL/ML IV SOLN COMPARISON:  CT scan chest, abdomen and pelvis from 05/29/2024 an MRI abdomen from 04/12/2024. FINDINGS: Lower chest: Unremarkable MR appearance to the lung bases. No pleural effusion. No pericardial effusion. Normal heart size. Hepatobiliary: The liver is normal in size. Noncirrhotic configuration. Redemonstration of a 1.0 x 1.4 cm subcapsular mildly T2 hyperintense lesion in the left hepatic lobe, segment 4A. The lesion has increased in size, since the prior study when it measured up to 6 mm. The lesion is not well visualized on the postcontrast images due to location which is susceptible to breathing motion; however, there is probable target like enhancement on the postcontrast images. Overall, findings favor solitary liver metastasis. Redemonstration of peripheral, wedge-shaped, 7 x 14 mm nonenhancing area in the right hepatic lobe, segment 6, which appears slightly decreased in size since the prior study and favored to represent involuting treated lesion. No local recurrence noted. No other focal liver lesion. No  intrahepatic or extrahepatic bile duct dilatation. No choledocholithiasis. Unremarkable gallbladder. Pancreas: No mass, inflammatory changes or other parenchymal abnormality identified. No main pancreatic duct dilation. Spleen:  Within normal limits in size and appearance. No focal mass. Adrenals/Urinary Tract: Unremarkable adrenal glands. No hydroureteronephrosis. No suspicious renal mass. Stomach/Bowel: Visualized portions within the abdomen are unremarkable. No disproportionate dilation of bowel loops. Vascular/Lymphatic: No pathologically enlarged lymph nodes identified. No abdominal aortic aneurysm demonstrated. No ascites. Other:  None. Musculoskeletal: No suspicious bone lesions identified. IMPRESSION: 1. There is a 1.0 x 1.4 cm subcapsular lesion in the left hepatic lobe, segment 4A, which has increased in size since the prior study and is concerning for a solitary liver metastasis. 2. Redemonstration of peripheral wedge-shaped nonenhancing area in the right hepatic lobe, segment 6, which is favored to represent involuting treated lesion. No local recurrence noted. 3. No other metastatic disease identified within the abdomen. Electronically Signed   By: Ree Molt M.D.   On: 07/25/2024 13:11    Labs:  CBC: Recent Labs    05/30/24 1000 06/10/24 1504 07/10/24 0740 07/23/24 0832  WBC 4.1 4.2 2.8* 2.2*  HGB 12.7 12.1 11.5* 12.1  HCT 38.5 36.0 33.7* 36.3  PLT 232 288 194 254    COAGS: Recent Labs    08/17/23 1115 01/16/24 0819  INR 1.0 1.0    BMP: Recent Labs    01/16/24 0819 06/10/24 1504 07/10/24 0740 07/23/24 0832  NA 138 138 139 139  K 3.8 4.0 4.1 4.0  CL 104 104 105 103  CO2 27 30 29 30   GLUCOSE 106* 96 120* 102*  BUN 14 12 15 14   CALCIUM 9.0 8.8* 8.6* 9.4  CREATININE 0.62 0.63 0.80 0.78  GFRNONAA >60 >60 >60 >60    LIVER FUNCTION TESTS: Recent Labs    01/16/24 0819 06/10/24 1504 07/10/24 0740 07/23/24 0832  BILITOT 0.5 0.3 0.3 0.2  AST 21 13* 15 17   ALT 18 10 12 14   ALKPHOS 56 60 85 90  PROT 6.6 6.7 6.3* 7.0  ALBUMIN 3.7 4.1 3.6 4.2    TUMOR MARKERS: No results for input(s): AFPTM, CEA, CA199, CHROMGRNA in the last 8760 hours.  Assessment and Plan:  Extremely pleasant 53 year old female with breast cancer metastatic to the liver.  Her most recent surveillance MRI demonstrated a complete response to therapy of the previously treated lesion, but interval enlargement of the subcapsular segment for a lesion which is under observation.  This lesion has definitively increased in size and demonstrates increased signal on diffusion-weighted imaging which is quite concerning for a second metastatic focus.  Given its location (anterior subcapsular hide in segment 4A just under the lower lung/diaphragm) this lesion may be very difficult to treat with percutaneous thermal ablation.  Transarterial radiation segmentectomy may be an option.  1.) Please schedule for pre-Y 90 planning arteriogram at [facility].  At the time of arteriography we will also interrogate the liver with ultrasound to see if the lesion is visible and approachable for percutaneous ablation.  If not approachable for percutaneous ablation, we will then complete the pre-Y 90 planning arteriogram and plan for transarterial radiation segmentectomy.    Electronically Signed: Wilkie MARLA Lent 07/29/2024, 8:30 AM   This encounter was conducted via the [employer] providing interactive audio and visual communication.  The patient provided verbal consent to conduct a virtual appointment.  The patient was located at their primary residence during this encounter.  I spent a total of  15 Minutes in face to face in clinical consultation, greater than 50% of which was counseling/coordinating care for breast cancer metastatic to the liver.

## 2024-08-05 NOTE — Progress Notes (Unsigned)
 [facility]       Telephone: [phone]?Fax: [phone]   Oncology Clinical Pharmacist Practitioner Progress Note  Ann Daugherty was contacted via in-person to discuss her chemotherapy regimen for abemaciclib  which they receive under the care of Dr. Vinay Gudena.  Current treatment regimen and start date Abemaciclib  (06/26/24) Anastrozole  (06/19/24)  Interval History She continues on abemaciclib  100 mg by mouth every 12 hours on days 1 to 28 of a 28-day cycle. This is being given in combination with anastrozole . Therapy is planned to continue until disease progression or unacceptable toxicity. She was last seen by Dr. Odean on 07/10/24 and clinical pharmacy on 07/23/24. Her ANC was 1300 cells/uL at her last visit with clinical pharmacy. Dr. Gudena is keeping her dose of abemaciclib  at 100 mg PO BID.  Response to Therapy Patient is still having some fatigue. Her bowel regimen is well controlled. She is taking 0.5 mg (quarter tablet) of loperamide and 1 tablet of colace daily.   Her ANC has come down again to an estimated 1000 cells/uL. She has a liver procedure next week under the care of Dr. Karalee. Dr. Gudena prefers she hold abemaciclib  for 1 week before and after so she will start holding abemaciclib  today and resume 08/20/24. She will then have labs on 10/20 and see Dr. Odean.  Labs, vitals, treatment parameters, and manufacturer guidelines assessing toxicity were reviewed with Ann Daugherty today. Based on these values, patient is in agreement to continue abemaciclib  therapy at this time.  Allergies No Known Allergies  Vitals    08/06/2024    9:03 AM 07/23/2024    9:04 AM 07/10/2024    9:08 AM  Oncology Vitals  Height 168 cm    Weight 62.143 kg 62.778 kg 61.689 kg  Weight (lbs) 137 lbs 138 lbs 6 oz 136 lbs  BMI 22.11 kg/m2 22.34 kg/m2 21.95 kg/m2  Temp 97.9 F (36.6 C) 97.7 F (36.5 C)   Pulse Rate 69 81 80  BP 95/52 93/62 107/67  Resp 18 17    SpO2 100 % 98 %   BSA (m2) 1.7 m2 1.71 m2 1.7 m2    Laboratory Data    Latest Ref Rng & Units 08/06/2024    8:27 AM 07/23/2024    8:32 AM 07/10/2024    7:40 AM  CBC EXTENDED  WBC 4.0 - 10.5 K/uL 1.9  2.2  2.8   RBC 3.87 - 5.11 MIL/uL 3.82  3.83  3.58   Hemoglobin 12.0 - 15.0 g/dL 87.3  87.8  88.4   HCT 36.0 - 46.0 % 35.6  36.3  33.7   Platelets 150 - 400 K/uL 238  254  194   NEUT# 1.7 - 7.7 K/uL 1.0  1.3  1.6   Lymph# 0.7 - 4.0 K/uL 0.6  0.6  0.7        Latest Ref Rng & Units 08/06/2024    8:27 AM 07/23/2024    8:32 AM 07/10/2024    7:40 AM  CMP  Glucose 70 - 99 mg/dL 95  897  879   BUN 6 - 20 mg/dL 9  14  15    Creatinine 0.44 - 1.00 mg/dL 9.06  9.21  9.19   Sodium 135 - 145 mmol/L 139  139  139   Potassium 3.5 - 5.1 mmol/L 4.1  4.0  4.1   Chloride 98 - 111 mmol/L 105  103  105   CO2 22 - 32 mmol/L 29  30  29   Calcium 8.9 - 10.3 mg/dL 9.3  9.4  8.6   Total Protein 6.5 - 8.1 g/dL 6.8  7.0  6.3   Total Bilirubin 0.0 - 1.2 mg/dL 0.4  0.2  0.3   Alkaline Phos 38 - 126 U/L 88  90  85   AST 15 - 41 U/L 18  17  15    ALT 0 - 44 U/L 14  14  12      Adverse Effects Assessment ANC: 1000 now. She is going to hold abemaciclib   Adherence Assessment Ann Daugherty reports missing 0 doses over the past 2 weeks.   Reason for missed dose: n/a Patient was re-educated on importance of adherence.   Access Assessment Ann Daugherty is currently receiving her abemaciclib  through [facility] concerns:  none  Medication Reconciliation The patient's medication list was reviewed today with the patient? Yes New medications or herbal supplements have recently been started? No  Any medications have been discontinued? No  The medication list was updated and reconciled based on the patient's most recent medication list in the electronic medical record (EMR) including herbal products and OTC medications.   Medications Current Outpatient Medications  Medication  Sig Dispense Refill   abemaciclib  (VERZENIO ) 100 MG tablet Take 1 tablet (100 mg total) by mouth 2 (two) times daily. 60 tablet 3   acetaminophen  (TYLENOL ) 500 MG tablet Take 2 tablets (1,000 mg total) by mouth every 6 (six) hours. 60 tablet 0   anastrozole  (ARIMIDEX ) 1 MG tablet Take 1 tablet (1 mg total) by mouth daily. 90 tablet 3   calcium carbonate (TUMS - DOSED IN MG ELEMENTAL CALCIUM) 500 MG chewable tablet Chew 1 tablet by mouth daily as needed (low calcium).     Cholecalciferol (VITAMIN D3) 5000 units CAPS Take 5,000 Units by mouth daily.     citalopram  (CELEXA ) 40 MG tablet Take 1 tablet (40 mg total) by mouth daily. 90 tablet 1   Cranberry 500 MG TABS Take 1 tablet by mouth 3 (three) times a week.     Cyanocobalamin (VITAMIN B-12) 1000 MCG SUBL Place 1,000 mcg under the tongue daily.     LORazepam  (ATIVAN ) 0.5 MG tablet Take 1 tablet (0.5 mg total) by mouth 2 (two) times daily as needed for anxiety. 30 tablet 1   No current facility-administered medications for this visit.    Drug-Drug Interactions (DDIs) DDIs were evaluated? Yes Significant DDIs? No  The patient was instructed to speak with their health care provider and/or the oral chemotherapy pharmacist before starting any new drug, including prescription or over the counter, natural / herbal products, or vitamins.  Supportive Care Diarrhea: we reviewed that diarrhea is common with abemaciclib  and confirmed that she does have loperamide (Imodium) at home.  We reviewed how to take this medication PRN. Neutropenia: we discussed the importance of having a thermometer and what the Centers for Disease Control and Prevention (CDC) considers a fever which is 100.29F (38C) or higher.  Gave patient 24/7 triage line to call if any fevers or symptoms. ILD/Pneumonitis: we reviewed potential symptoms including cough, shortness, and fatigue.  VTE: reviewed signs of DVT such as leg swelling, redness, pain, or tenderness and signs of PE such  as shortness of breath, rapid or irregular heartbeat, cough, chest pain, or lightheadedness. Reviewed to take the medication every 12 hours (with food sometimes can be easier on the stomach) and to take it at the same time every day. Hepatotoxicity:WNL Drug  interactions with grapefruit products  Dosing Assessment Hepatic adjustments needed? No  Renal adjustments needed? No  Toxicity adjustments needed? No  The current dosing regimen is appropriate to continue at this time.  Follow-Up Plan HOLD abemaciclib  100 mg by mouth every 12 hours due to liver procedure on 08/13/24. Will resume on 08/20/24. Continue anastrozole  1 mg by mouth daily Monitor for side effects: ANC is 1000 cells/uL today. Monitor closely and hold if < 1000 (Grade 3) Restaging scans to be ordered for expected date of 09/26/24 (started abemaciclib  on 06/26/24)  Port flush with labs, Dr. Odean visit scheduled for 08/25/24.  We will add port flush labs and pharmacy clinic visit in 5 weeks Ann Daugherty can follow up with clinical pharmacy as deemed necessary by Dr. Mackey Odean going forward   Comer Ann Daugherty participated in the discussion, expressed understanding, and voiced agreement with the above plan. All questions were answered to her satisfaction. The patient was advised to contact the clinic at [phone] with any questions or concerns prior to her return visit.   I spent 30 minutes assessing and educating the patient.  Caillou Minus A. Lucila, PharmD, BCOP, CPP  Norleen DELENA Lucila, RPH-CPP, 08/06/2024  9:45 AM   **Disclaimer: This note was dictated with voice recognition software. Similar sounding words can inadvertently be transcribed and this note may contain transcription errors which may not have been corrected upon publication of note.**

## 2024-08-06 ENCOUNTER — Inpatient Hospital Stay: Attending: Adult Health | Admitting: Pharmacist

## 2024-08-06 ENCOUNTER — Inpatient Hospital Stay

## 2024-08-06 VITALS — BP 95/52 | HR 69 | Temp 97.9°F | Resp 18 | Ht 66.0 in | Wt 137.0 lb

## 2024-08-06 DIAGNOSIS — Z17410 Hormone receptor positive with human epidermal growth factor receptor 2 positive status: Secondary | ICD-10-CM | POA: Diagnosis not present

## 2024-08-06 DIAGNOSIS — C50212 Malignant neoplasm of upper-inner quadrant of left female breast: Secondary | ICD-10-CM

## 2024-08-06 DIAGNOSIS — Z79811 Long term (current) use of aromatase inhibitors: Secondary | ICD-10-CM | POA: Insufficient documentation

## 2024-08-06 DIAGNOSIS — C787 Secondary malignant neoplasm of liver and intrahepatic bile duct: Secondary | ICD-10-CM | POA: Insufficient documentation

## 2024-08-06 LAB — CMP (CANCER CENTER ONLY)
ALT: 14 U/L (ref 0–44)
AST: 18 U/L (ref 15–41)
Albumin: 4.2 g/dL (ref 3.5–5.0)
Alkaline Phosphatase: 88 U/L (ref 38–126)
Anion gap: 5 (ref 5–15)
BUN: 9 mg/dL (ref 6–20)
CO2: 29 mmol/L (ref 22–32)
Calcium: 9.3 mg/dL (ref 8.9–10.3)
Chloride: 105 mmol/L (ref 98–111)
Creatinine: 0.93 mg/dL (ref 0.44–1.00)
GFR, Estimated: >60 mL/min (ref >60)
Glucose, Bld: 95 mg/dL (ref 70–99)
Potassium: 4.1 mmol/L (ref 3.5–5.1)
Sodium: 139 mmol/L (ref 135–145)
Total Bilirubin: 0.4 mg/dL (ref 0.0–1.2)
Total Protein: 6.8 g/dL (ref 6.5–8.1)

## 2024-08-06 LAB — CBC WITH DIFFERENTIAL (CANCER CENTER ONLY)
Abs Immature Granulocytes: 0.01 K/uL (ref 0.00–0.07)
Basophils Absolute: 0 K/uL (ref 0.0–0.1)
Basophils Relative: 1 %
Eosinophils Absolute: 0.1 K/uL (ref 0.0–0.5)
Eosinophils Relative: 5 %
HCT: 35.6 % — ABNORMAL LOW (ref 36.0–46.0)
Hemoglobin: 12.6 g/dL (ref 12.0–15.0)
Immature Granulocytes: 1 %
Lymphocytes Relative: 31 %
Lymphs Abs: 0.6 K/uL — ABNORMAL LOW (ref 0.7–4.0)
MCH: 33 pg (ref 26.0–34.0)
MCHC: 35.4 g/dL (ref 30.0–36.0)
MCV: 93.2 fL (ref 80.0–100.0)
Monocytes Absolute: 0.2 K/uL (ref 0.1–1.0)
Monocytes Relative: 11 %
Neutro Abs: 1 K/uL — ABNORMAL LOW (ref 1.7–7.7)
Neutrophils Relative %: 51 %
Platelet Count: 238 K/uL (ref 150–400)
RBC: 3.82 MIL/uL — ABNORMAL LOW (ref 3.87–5.11)
RDW: 12.8 % (ref 11.5–15.5)
WBC Count: 1.9 K/uL — ABNORMAL LOW (ref 4.0–10.5)
nRBC: 0 % (ref 0.0–0.2)

## 2024-08-07 ENCOUNTER — Other Ambulatory Visit: Payer: Self-pay | Admitting: Interventional Radiology

## 2024-08-07 DIAGNOSIS — R160 Hepatomegaly, not elsewhere classified: Secondary | ICD-10-CM

## 2024-08-07 LAB — CANCER ANTIGEN 27.29: CA 27.29: 26.3 U/mL (ref 0.0–38.6)

## 2024-08-12 DIAGNOSIS — K769 Liver disease, unspecified: Secondary | ICD-10-CM | POA: Insufficient documentation

## 2024-08-12 MED ORDER — SODIUM CHLORIDE 0.9 % IV SOLN: INTRAVENOUS | Status: AC

## 2024-08-13 ENCOUNTER — Encounter: Payer: Self-pay | Admitting: Radiology

## 2024-08-13 ENCOUNTER — Other Ambulatory Visit: Payer: Self-pay

## 2024-08-13 ENCOUNTER — Ambulatory Visit
Admission: RE | Admit: 2024-08-13 | Discharge: 2024-08-13 | Disposition: A | Discharge status: Home / Self Care | Source: Ambulatory Visit | Attending: Interventional Radiology | Admitting: Interventional Radiology

## 2024-08-13 VITALS — BP 98/47 | HR 64 | Temp 97.6°F | Resp 19 | Ht 66.0 in | Wt 138.7 lb

## 2024-08-13 DIAGNOSIS — C787 Secondary malignant neoplasm of liver and intrahepatic bile duct: Secondary | ICD-10-CM | POA: Diagnosis not present

## 2024-08-13 DIAGNOSIS — Z87891 Personal history of nicotine dependence: Secondary | ICD-10-CM | POA: Diagnosis not present

## 2024-08-13 DIAGNOSIS — K769 Liver disease, unspecified: Secondary | ICD-10-CM

## 2024-08-13 DIAGNOSIS — R160 Hepatomegaly, not elsewhere classified: Secondary | ICD-10-CM

## 2024-08-13 DIAGNOSIS — C50919 Malignant neoplasm of unspecified site of unspecified female breast: Secondary | ICD-10-CM | POA: Insufficient documentation

## 2024-08-13 DIAGNOSIS — Z9221 Personal history of antineoplastic chemotherapy: Secondary | ICD-10-CM | POA: Diagnosis not present

## 2024-08-13 HISTORY — PX: IR ANGIOGRAM VISCERAL SELECTIVE: IMG657

## 2024-08-13 LAB — CBC WITH DIFFERENTIAL/PLATELET
Abs Immature Granulocytes: 0.01 K/uL (ref 0.00–0.07)
Basophils Absolute: 0 K/uL (ref 0.0–0.1)
Basophils Relative: 1 %
Eosinophils Absolute: 0 K/uL (ref 0.0–0.5)
Eosinophils Relative: 0 %
HCT: 33.4 % — ABNORMAL LOW (ref 36.0–46.0)
Hemoglobin: 11.4 g/dL — ABNORMAL LOW (ref 12.0–15.0)
Immature Granulocytes: 0 %
Lymphocytes Relative: 26 %
Lymphs Abs: 0.7 K/uL (ref 0.7–4.0)
MCH: 32.7 pg (ref 26.0–34.0)
MCHC: 34.1 g/dL (ref 30.0–36.0)
MCV: 95.7 fL (ref 80.0–100.0)
Monocytes Absolute: 0.4 K/uL (ref 0.1–1.0)
Monocytes Relative: 14 %
Neutro Abs: 1.6 K/uL — ABNORMAL LOW (ref 1.7–7.7)
Neutrophils Relative %: 59 %
Platelets: 241 K/uL (ref 150–400)
RBC: 3.49 MIL/uL — ABNORMAL LOW (ref 3.87–5.11)
RDW: 12.6 % (ref 11.5–15.5)
WBC: 2.6 K/uL — ABNORMAL LOW (ref 4.0–10.5)
nRBC: 0 % (ref 0.0–0.2)

## 2024-08-13 LAB — COMPREHENSIVE METABOLIC PANEL WITH GFR
ALT: 15 U/L (ref 0–44)
AST: 20 U/L (ref 15–41)
Albumin: 3.7 g/dL (ref 3.5–5.0)
Alkaline Phosphatase: 75 U/L (ref 38–126)
Anion gap: 9 (ref 5–15)
BUN: 15 mg/dL (ref 6–20)
CO2: 24 mmol/L (ref 22–32)
Calcium: 8.7 mg/dL — ABNORMAL LOW (ref 8.9–10.3)
Chloride: 107 mmol/L (ref 98–111)
Creatinine, Ser: 0.58 mg/dL (ref 0.44–1.00)
GFR, Estimated: >60 mL/min (ref >60)
Glucose, Bld: 99 mg/dL (ref 70–99)
Potassium: 3.5 mmol/L (ref 3.5–5.1)
Sodium: 140 mmol/L (ref 135–145)
Total Bilirubin: 0.4 mg/dL (ref 0.0–1.2)
Total Protein: 6.5 g/dL (ref 6.5–8.1)

## 2024-08-13 LAB — PROTIME-INR
INR: 1 (ref 0.8–1.2)
Prothrombin Time: 13.2 s (ref 11.4–15.2)

## 2024-08-13 MED ORDER — MIDAZOLAM HCL 2 MG/2ML IJ SOLN
INTRAMUSCULAR | Status: AC
  Filled 2024-08-13: qty 2

## 2024-08-13 MED ORDER — TECHNETIUM TO 99M ALBUMIN AGGREGATED
4.4200 | Freq: Once | INTRAVENOUS | Status: AC | PRN
  Administered 2024-08-13: 4.42 via INTRAVENOUS

## 2024-08-13 MED ORDER — HEPARIN SOD (PORK) LOCK FLUSH 100 UNIT/ML IV SOLN
INTRAVENOUS | Status: AC
  Filled 2024-08-13: qty 5

## 2024-08-13 MED ORDER — FENTANYL CITRATE (PF) 100 MCG/2ML IJ SOLN
INTRAMUSCULAR | Status: AC
  Filled 2024-08-13: qty 2

## 2024-08-13 MED ORDER — LIDOCAINE HCL 1 % IJ SOLN
INTRAMUSCULAR | Status: AC
  Filled 2024-08-13: qty 20

## 2024-08-13 MED ORDER — MIDAZOLAM HCL 2 MG/2ML IJ SOLN
INTRAMUSCULAR | Status: AC | PRN
  Administered 2024-08-13: 1 mg via INTRAVENOUS
  Administered 2024-08-13 (×2): .5 mg via INTRAVENOUS

## 2024-08-13 MED ORDER — HEPARIN SOD (PORK) LOCK FLUSH 100 UNIT/ML IV SOLN
500.0000 [IU] | Freq: Once | INTRAVENOUS | Status: AC
  Administered 2024-08-13: 500 [IU] via INTRAVENOUS

## 2024-08-13 MED ORDER — FENTANYL CITRATE (PF) 100 MCG/2ML IJ SOLN
INTRAMUSCULAR | Status: AC | PRN
  Administered 2024-08-13: 25 ug via INTRAVENOUS
  Administered 2024-08-13: 50 ug via INTRAVENOUS
  Administered 2024-08-13: 25 ug via INTRAVENOUS

## 2024-08-13 MED ORDER — LIDOCAINE HCL 1 % IJ SOLN
10.0000 mL | Freq: Once | INTRAMUSCULAR | Status: AC
  Administered 2024-08-13: 3 mL via INTRADERMAL

## 2024-08-13 MED ORDER — IOHEXOL 300 MG/ML  SOLN
100.0000 mL | Freq: Once | INTRAMUSCULAR | Status: AC | PRN
  Administered 2024-08-13: 75 mL

## 2024-08-13 NOTE — Progress Notes (Signed)
 Patient clinically stable post IR Y-90 mapping vs embo per Dr Karalee, tolerated well. Vitals stable pre and post procedure. No bleeding nor hematoma at right groin site. Denies complaints post procedure. Mother in room with update given. Received Versed  2 mg along with Fentanyl  100 mcg IV for procedure. Report given to Grayce Ann Rn post procedure/specials/19

## 2024-08-13 NOTE — H&P (Signed)
 Chief Complaint:  Breast cancer metastatic to the liver  Procedure: Angiogram for Pre-Y90 mapping  Referring Provider(s): Dr. Mackey Chad  Supervising Physician: Karalee Beat  Patient Status: [facility] - Out-pt  History of Present Illness: Ann Daugherty is a 53 y.o. female with a history of Stage IV breast cancer with lesions metastatic to the liver. She is known to IR from previous liver biopsy and MWA ablation with Dr. VEAR Karalee in March. She recently completed her neoadjuvant chemotherapy in February and is now a candidate for liver directed therapy. Most recent MR Abdomen from 9/19 demonstrated increased in size of the left hepatic lobe lesion and nonenhancing lesion in right hepatic lobe, previously treated. She was seen by Dr. Karalee in clinic on 9/23 for follow up and to discuss possible treatment options moving forward. Given that she has now completed her chemotherapy, she is a good candidate for either Y90 segmentectomy vs another MWA ablation. She presents today for her angiogram for pre-Y90 mapping to evaluate the best approach to treat the lesion.   Pleasant female resting in bed with family at the bedside. States that she is somewhat anxious, tired, and hungry, but has otherwise been feeling great. She denies any abdominal pain, chest pain, shortness of breath, fevers/chills, nausea/vomiting, or changes in appetite. NPO since midnight. All questions and concerns answered at the bedside.   Patient is Full Code  Past Medical History:  Diagnosis Date   ADD (attention deficit disorder)    Anemia    Breast cancer metastasized to liver, unspecified laterality (HCC) 08/2023   01-16-2024 s/p microwave ablation large liver lesion w/ residual small lesion   Depression    GAD (generalized anxiety disorder)    GERD (gastroesophageal reflux disease)    History of antineoplastic chemotherapy 08/2023   08-15-2023  to  20-20-2025   History of external beam radiation  therapy 01/24/2024   left chest wall   s/p bilateral mastectomy   01-24-2024  to  04-16-2024   Malignant neoplasm of upper-inner quadrant of left breast in female, estrogen receptor positive (HCC) 07/2023   oncologist-- dr catrina gyn oncology--- dr forsyth/ surgeon -- dr aron;  dx 07/2023  HGDCIS/ ICDG2; liver bx confirmed mets ; Stage IV;  completed chemo 02/ 2025; 03/ 2025 s/p bilateral mastectomy; completed IMRT 04-16-2024; scheduled for Hystectomy w/ BSO 06-03-2024   Pap smear abnormality of cervix with LGSIL 05/20/2024   w/ + HPV   PONV (postoperative nausea and vomiting)    Port-A-Cath in place 08/14/2023    Past Surgical History:  Procedure Laterality Date   BREAST BIOPSY Left 07/19/2023   US  LT BREAST BX W LOC DEV EA ADD LESION IMG BX SPEC US  GUIDE 07/19/2023 [facility]   BREAST BIOPSY Left 07/19/2023   US  LT BREAST BX W LOC DEV 1ST LESION IMG BX SPEC US  GUIDE 07/19/2023 [facility]   BREAST BIOPSY Right 08/09/2023   US  RT BREAST BX W LOC DEV 1ST LESION IMG BX SPEC US  GUIDE 08/09/2023 [facility]   BREAST BIOPSY Right 08/09/2023   US  RT BREAST BX W LOC DEV EA ADD LESION IMG BX SPEC US  GUIDE 08/09/2023 [facility]   CYSTOSCOPY N/A 06/03/2024   Procedure: CYSTOSCOPY;  Surgeon: Erik Kieth BROCKS, MD;  Location: [facility] OR;  Service: Gynecology;  Laterality: N/A;   DILATION AND CURETTAGE OF UTERUS  1990   IR GUIDED LIVER TUMOR ABLATION RFA PERC     IR RADIOLOGIST EVAL & MGMT  01/08/2024  IR RADIOLOGIST EVAL & MGMT  01/31/2024   IR RADIOLOGIST EVAL & MGMT  04/17/2024   IR RADIOLOGIST EVAL & MGMT  07/29/2024   LAMINECTOMY AND MICRODISCECTOMY LUMBAR SPINE  2011   by dr onetha;   re-do L4--5   LUMBAR LAMINECTOMY/DECOMPRESSION WITH DISCECTOMY  09/17/2006   @[facility]  by dr onetha;   L4-5   PORTACATH PLACEMENT Right 08/14/2023   Procedure: INSERTION PORT-A-CATH WITH ULTRASOUND ANDREA;  Surgeon: Aron Shoulders, MD;  Location: [facility] OR;  Service: General;  Laterality: Right;    SIMPLE MASTECTOMY WITH AXILLARY SENTINEL NODE BIOPSY Bilateral 01/24/2024   Procedure: BILATERAL MASTECTOMY;  Surgeon: Aron Shoulders, MD;  Location: [facility];  Service: General;  Laterality: Bilateral;  PEC BLOCK   TOTAL LAPAROSCOPIC HYSTERECTOMY WITH BILATERAL SALPINGO OOPHORECTOMY Bilateral 06/03/2024   Procedure: HYSTERECTOMY, TOTAL, LAPAROSCOPIC, WITH BILATERAL SALPINGO-OOPHORECTOMY;  Surgeon: Erik Kieth BROCKS, MD;  Location: [facility] OR;  Service: Gynecology;  Laterality: Bilateral;  TLH/BSO/cysto    Allergies: Patient has no known allergies.  Medications: Prior to Admission medications   Medication Sig Start Date End Date Taking? Authorizing Provider  abemaciclib  (VERZENIO ) 100 MG tablet Take 1 tablet (100 mg total) by mouth 2 (two) times daily. 06/19/24  Yes Gudena, Vinay, MD  acetaminophen  (TYLENOL ) 500 MG tablet Take 2 tablets (1,000 mg total) by mouth every 6 (six) hours. 06/03/24  Yes Erik Kieth BROCKS, MD  anastrozole  (ARIMIDEX ) 1 MG tablet Take 1 tablet (1 mg total) by mouth daily. 06/19/24  Yes Causey, Morna Pickle, NP  calcium carbonate (TUMS - DOSED IN MG ELEMENTAL CALCIUM) 500 MG chewable tablet Chew 1 tablet by mouth daily as needed (low calcium).   Yes [provider]  Cholecalciferol (VITAMIN D3) 5000 units CAPS Take 5,000 Units by mouth daily. 12/07/16  Yes [provider]  citalopram  (CELEXA ) 40 MG tablet Take 1 tablet (40 mg total) by mouth daily. 04/22/24  Yes Causey, Morna Pickle, NP  Cranberry 500 MG TABS Take 1 tablet by mouth 3 (three) times a week.   Yes [provider]  Cyanocobalamin (VITAMIN B-12) 1000 MCG SUBL Place 1,000 mcg under the tongue daily.   Yes [provider]  LORazepam  (ATIVAN ) 0.5 MG tablet Take 1 tablet (0.5 mg total) by mouth 2 (two) times daily as needed for anxiety. 02/14/24  Yes [name], MD     Family History  Problem Relation Age of Onset   Breast cancer Mother 5   Colon cancer  Mother 26   Asthma Mother    Hypertension Mother    Hearing loss Mother    Hypertension Father    Diabetes Father    Hyperlipidemia Father    Learning disabilities Son    ADD / ADHD Son    Ovarian cancer Maternal Grandmother    Hypertension Maternal Grandfather    Hyperlipidemia Maternal Grandfather    Heart disease Maternal Grandfather    Stroke Maternal Grandfather    Mental illness Paternal Grandmother    Hypertension Paternal Grandfather     Social History   Socioeconomic History   Marital status: Divorced    Spouse name: Not on file   Number of children: 2   Years of education: Not on file   Highest education level: Not on file  Occupational History   Not on file  Tobacco Use   Smoking status: Former    Current packs/day: 0.00    Average packs/day: 0.5 packs/day for 30.0 years (15.0 ttl pk-yrs)    Types: Cigarettes  Start date: 04/07/1991    Quit date: 04/06/2021    Years since quitting: 3.3   Smokeless tobacco: Never  Vaping Use   Vaping status: Every Day   Substances: Nicotine   Devices: jewel  (pt stated started in 2022)  Substance and Sexual Activity   Alcohol use: Not Currently   Drug use: Not Currently    Comment: 05-28-2024  CBD gummies , last time 2022   Sexual activity: Not Currently    Birth control/protection: None  Other Topics Concern   Not on file  Social History Narrative   2  sons: 72 y.o. and 52 y.o. : 14 y.o. son lives with pt. 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   Social Drivers of Health   Financial Resource Strain: Not on file  Food Insecurity: Food Insecurity Present (02/19/2024)   Hunger Vital Sign    Worried About Running Out of Food in the Last Year: Sometimes true    Ran Out of Food in the Last Year: Sometimes true  Transportation Needs: No Transportation Needs (02/19/2024)   PRAPARE - Administrator, Civil Service (Medical): No    Lack of Transportation (Non-Medical): No  Physical Activity: Not on file   Stress: Not on file  Social Connections: Not on file    Review of Systems Patient denies any headache, chest pain, shortness of breath, abdominal pain, N/V, or fever/chills. All other systems are negative.   Vital Signs: Temp 97.7 F (36.5 C) (Oral)   Ht 5' 6 (1.676 m)   Wt 138 lb 11.2 oz (62.9 kg)   BMI 22.39 kg/m     Physical Exam Vitals reviewed.  Constitutional:      Appearance: Normal appearance.  HENT:     Head: Normocephalic and atraumatic.     Mouth/Throat:     Mouth: Mucous membranes are moist.     Pharynx: Oropharynx is clear.  Cardiovascular:     Rate and Rhythm: Normal rate and regular rhythm.  Pulmonary:     Effort: Pulmonary effort is normal.  Abdominal:     General: Abdomen is flat.     Palpations: Abdomen is soft.     Tenderness: There is no abdominal tenderness.  Musculoskeletal:     Cervical back: Normal range of motion.  Skin:    General: Skin is warm and dry.  Neurological:     General: No focal deficit present.     Mental Status: She is alert and oriented to person, place, and time.  Psychiatric:        Behavior: Behavior normal.     Imaging: IR Radiologist Eval & Mgmt Result Date: 07/29/2024 EXAM: ESTABLISHED PATIENT OFFICE VISIT CHIEF COMPLAINT: SEE NOTE IN EPIC HISTORY OF PRESENT ILLNESS: SEE NOTE IN EPIC REVIEW OF SYSTEMS: SEE NOTE IN EPIC PHYSICAL EXAMINATION: SEE NOTE IN EPIC ASSESSMENT AND PLAN: SEE NOTE IN EPIC Electronically Signed   By: Wilkie Lent M.D.   On: 07/29/2024 09:13   MR ABDOMEN WWO CONTRAST Result Date: 07/25/2024 CLINICAL DATA:  History provided by technologist Metastasis to liver. EXAM: MRI ABDOMEN WITHOUT AND WITH CONTRAST TECHNIQUE: Multiplanar multisequence MR imaging of the abdomen was performed both before and after the administration of intravenous contrast. CONTRAST:  6mL GADAVIST  GADOBUTROL  1 MMOL/ML IV SOLN COMPARISON:  CT scan chest, abdomen and pelvis from 05/29/2024 an MRI abdomen from 04/12/2024.  FINDINGS: Lower chest: Unremarkable MR appearance to the lung bases. No pleural effusion. No pericardial effusion. Normal heart  size. Hepatobiliary: The liver is normal in size. Noncirrhotic configuration. Redemonstration of a 1.0 x 1.4 cm subcapsular mildly T2 hyperintense lesion in the left hepatic lobe, segment 4A. The lesion has increased in size, since the prior study when it measured up to 6 mm. The lesion is not well visualized on the postcontrast images due to location which is susceptible to breathing motion; however, there is probable target like enhancement on the postcontrast images. Overall, findings favor solitary liver metastasis. Redemonstration of peripheral, wedge-shaped, 7 x 14 mm nonenhancing area in the right hepatic lobe, segment 6, which appears slightly decreased in size since the prior study and favored to represent involuting treated lesion. No local recurrence noted. No other focal liver lesion. No intrahepatic or extrahepatic bile duct dilatation. No choledocholithiasis. Unremarkable gallbladder. Pancreas: No mass, inflammatory changes or other parenchymal abnormality identified. No main pancreatic duct dilation. Spleen:  Within normal limits in size and appearance. No focal mass. Adrenals/Urinary Tract: Unremarkable adrenal glands. No hydroureteronephrosis. No suspicious renal mass. Stomach/Bowel: Visualized portions within the abdomen are unremarkable. No disproportionate dilation of bowel loops. Vascular/Lymphatic: No pathologically enlarged lymph nodes identified. No abdominal aortic aneurysm demonstrated. No ascites. Other:  None. Musculoskeletal: No suspicious bone lesions identified. IMPRESSION: 1. There is a 1.0 x 1.4 cm subcapsular lesion in the left hepatic lobe, segment 4A, which has increased in size since the prior study and is concerning for a solitary liver metastasis. 2. Redemonstration of peripheral wedge-shaped nonenhancing area in the right hepatic lobe, segment 6,  which is favored to represent involuting treated lesion. No local recurrence noted. 3. No other metastatic disease identified within the abdomen. Electronically Signed   By: Ree Molt M.D.   On: 07/25/2024 13:11    Labs:  CBC: Recent Labs    07/10/24 0740 07/23/24 0832 08/06/24 0827 08/13/24 0752  WBC 2.8* 2.2* 1.9* 2.6*  HGB 11.5* 12.1 12.6 11.4*  HCT 33.7* 36.3 35.6* 33.4*  PLT 194 254 238 241    COAGS: Recent Labs    08/17/23 1115 01/16/24 0819  INR 1.0 1.0    BMP: Recent Labs    06/10/24 1504 07/10/24 0740 07/23/24 0832 08/06/24 0827  NA 138 139 139 139  K 4.0 4.1 4.0 4.1  CL 104 105 103 105  CO2 30 29 30 29   GLUCOSE 96 120* 102* 95  BUN 12 15 14 9   CALCIUM 8.8* 8.6* 9.4 9.3  CREATININE 0.63 0.80 0.78 0.93  GFRNONAA >60 >60 >60 >60    LIVER FUNCTION TESTS: Recent Labs    06/10/24 1504 07/10/24 0740 07/23/24 0832 08/06/24 0827  BILITOT 0.3 0.3 0.2 0.4  AST 13* 15 17 18   ALT 10 12 14 14   ALKPHOS 60 85 90 88  PROT 6.7 6.3* 7.0 6.8  ALBUMIN 4.1 3.6 4.2 4.2    TUMOR MARKERS: No results for input(s): AFPTM, CEA, CA199, CHROMGRNA in the last 8760 hours.  Assessment and Plan:  Stage IV breast cancer metastatic to the liver: Ann Daugherty is a 53 y.o. female with a history of breast cancer with metastases to the liver s/p previous MWA ablation with Dr. Karalee in March of this year. Patient has completed her neoadjuvant therapy and is now a candidate for further liver directed therapies. She presents to [facility] this morning for her angiogram for pre-Y90 mapping with Dr. VEAR Karalee. Procedure to be performed under moderate sedation.  Risks and benefits of angiogram/Y90 mapping were discussed with the patient including, but not limited to  bleeding, infection, vascular injury or contrast induced renal failure.  This interventional procedure involves the use of X-rays and because of the nature of the planned procedure, it is possible that  we will have prolonged use of X-ray fluoroscopy.  Potential radiation risks to you include (but are not limited to) the following: - A slightly elevated risk for cancer  several years later in life. This risk is typically less than 0.5% percent. This risk is low in comparison to the normal incidence of human cancer, which is 33% for women and 50% for men according to the [employer] Society. - Radiation induced injury can include skin redness, resembling a rash, tissue breakdown / ulcers and hair loss (which can be temporary or permanent).   The likelihood of either of these occurring depends on the difficulty of the procedure and whether you are sensitive to radiation due to previous procedures, disease, or genetic conditions.   IF your procedure requires a prolonged use of radiation, you will be notified and given written instructions for further action.  It is your responsibility to monitor the irradiated area for the 2 weeks following the procedure and to notify your physician if you are concerned that you have suffered a radiation induced injury.    All of the patient's questions were answered, patient is agreeable to proceed.  Consent signed and in chart.   Thank you for allowing our service to participate in KLARE CRISS 's care.    Electronically Signed: Glennon CHRISTELLA Bal, PA-C   08/13/2024, 8:19 AM     I spent a total of 15 Minutes in face to face in clinical consultation, greater than 50% of which was counseling/coordinating care for angiogram for pre-Y90 mapping.

## 2024-08-13 NOTE — Progress Notes (Signed)
 Dr. Karalee in at bedside, speaking with pt. And her mother Rollene re: procedure results, follow up, plans for future appt.s Both ladies verbalized understanding of conversation with MD.

## 2024-08-18 ENCOUNTER — Other Ambulatory Visit: Payer: Self-pay | Admitting: Interventional Radiology

## 2024-08-18 ENCOUNTER — Other Ambulatory Visit (HOSPITAL_COMMUNITY): Payer: Self-pay

## 2024-08-18 DIAGNOSIS — C50212 Malignant neoplasm of upper-inner quadrant of left female breast: Secondary | ICD-10-CM

## 2024-08-18 DIAGNOSIS — R160 Hepatomegaly, not elsewhere classified: Secondary | ICD-10-CM

## 2024-08-18 NOTE — Research (Signed)
 [id number], ICE COMPRESS: RANDOMIZED TRIAL OF LIMB CRYOCOMPRESSION VERSUS CONTINUOUS COMPRESSION VERSUS LOW CYCLIC COMPRESSION FOR THE PREVENTION  OF TAXANE-INDUCED PERIPHERAL NEUROPATHY  Research RN called pt to schedule her 52 week follow up for this study. Pt will come in on 09-10-24 at 1030 for her pro's and assessments. Pt will also have port and see John in pharmacy as well prior to research appointment. Pt is doing well, and had no questions or concerns. Pt has research RN contact information for any questions or concerns that arise.   Delon Pinal BSN RN Clinical Research Nurse [facility] Direct Dial: [phone] 08/18/2024  9:56 AM

## 2024-08-21 ENCOUNTER — Telehealth: Payer: Self-pay | Admitting: Pharmacist

## 2024-08-21 ENCOUNTER — Telehealth: Payer: Self-pay

## 2024-08-21 NOTE — Telephone Encounter (Signed)
 Canary has re-scheduled her appointment to 11/4 due to her procedure being on 11/5

## 2024-08-21 NOTE — Telephone Encounter (Signed)
 [id number], ICE COMPRESS: RANDOMIZED TRIAL OF LIMB CRYOCOMPRESSION VERSUS CONTINUOUS COMPRESSION VERSUS LOW CYCLIC COMPRESSION FOR THE PREVENTION  OF TAXANE-INDUCED PERIPHERAL NEUROPATHY  Pt called research RN to reschedule her 52 week assessments/follow up. Pt will now come in on 09-09-24 at 1000 for pros's and assessments per study protocol. Pt had no questions or concerns at this time.   Delon Pinal BSN RN Clinical Research Nurse [facility] Direct Dial: [phone] 08/21/2024  2:11 PM

## 2024-08-25 ENCOUNTER — Inpatient Hospital Stay: Admitting: Hematology and Oncology

## 2024-08-25 ENCOUNTER — Other Ambulatory Visit

## 2024-08-25 ENCOUNTER — Inpatient Hospital Stay

## 2024-08-25 ENCOUNTER — Telehealth: Payer: Self-pay

## 2024-08-25 NOTE — Assessment & Plan Note (Deleted)
 07/19/2023:Palpable left breast mass for 6 months.  Mammogram and ultrasound revealed large masses.   10:30 position: 4.3 cm with skin thickening and ulceration: Grade 2 IDC with high-grade DCIS ER 95%, PR 95%, Ki67 30%, HER2 2+ by IHC, FISH negative ratio 1.37;  5 o'clock position: 3.3 cm with calcifications spanning 6.7 cm, axilla negative, biopsy: Grade 3 IDC with necrosis ER 95% PR 90% Ki-67 60% HER2 1+ negative   CT CAP 08/03/2023: 2 prominent left breast masses, left axillary lymph nodes, right hepatic lobe lesion concerning for metastatic breast cancer Ultrasound a liver biopsy scheduled for 08/17/2023   Treatment plan: Neoadjuvant chemotherapy with dose Adriamycin  and Cytoxan  followed by Taxol  completed 12/27/2023 Mastectomy left breast 01/24/2024 Adjuvant radiation completed 04/16/24 Liver directed therapy (underwent microwave ablation 01/16/2024) Antiestrogen therapy with CDK inhibitors started 06/25/24 ---------------------------------------------------------------------------------------------------- MRI liver 12/26/2023: Significantly diminished size of hepatic metastases (0.6 cm, 1.4 cm, (used to be 3.6 cm), 0.7 cm) MRI breast 12/31/2023: Significant positive response to chemo.  Both left malignancies decrease in size (3 cm with enhancement) and 1.1 cm CT CAP 12/26/2023: Significant decrease in breast masses, resolution of left axillary lymph nodes, decrease liver metastases   01/24/24: Bilateral Mastectomies Right: Benign Left: 2 residual tumors 2.8 cm and 1.6 cm, Margins neg, DCIS, ER 85%, PR: 25%, Her 2: 1+ Neg, Ki 67: 10% 06/04/2023: Hysterectomy and bilateral salpingo-oophorectomy    Current treatment:: Anti-estrogen therapy with Verzenio  started 06/26/2024 Adverse effects: Mild diarrhea: Managing well. Will keep her at 100 mg bid dose Neuropenia: Monitoring for now Severe fatigue: Advised to take B 12 supplements   Liver MRI 04/12/2024: Stable 6 mm subcapsular left hepatic dome  lesion similar, wedge-shaped deformity right hepatic lobe 1 x 2 cm without enhancement (treated metastatic lesion)   Return to clinic in 1 month for follow-up after scans

## 2024-08-25 NOTE — Telephone Encounter (Signed)
 S/w patient regarding today's appointments. Patient states that she got the dates confused and would not be able to make today's appointments. Patient rescheduled for Wednesday for port flush with labs and MD visit with Dr. Gudena. Patient confirmed new appointment dates and times.

## 2024-08-27 ENCOUNTER — Other Ambulatory Visit (HOSPITAL_COMMUNITY): Payer: Self-pay

## 2024-08-27 ENCOUNTER — Inpatient Hospital Stay: Admitting: Hematology and Oncology

## 2024-08-27 ENCOUNTER — Inpatient Hospital Stay

## 2024-08-27 VITALS — BP 108/52 | HR 70 | Temp 98.0°F | Resp 16 | Ht 66.0 in | Wt 142.8 lb

## 2024-08-27 DIAGNOSIS — Z17410 Hormone receptor positive with human epidermal growth factor receptor 2 positive status: Secondary | ICD-10-CM | POA: Diagnosis not present

## 2024-08-27 DIAGNOSIS — Z79811 Long term (current) use of aromatase inhibitors: Secondary | ICD-10-CM | POA: Diagnosis not present

## 2024-08-27 DIAGNOSIS — C50212 Malignant neoplasm of upper-inner quadrant of left female breast: Secondary | ICD-10-CM | POA: Diagnosis not present

## 2024-08-27 DIAGNOSIS — C787 Secondary malignant neoplasm of liver and intrahepatic bile duct: Secondary | ICD-10-CM | POA: Diagnosis not present

## 2024-08-27 DIAGNOSIS — Z170 Estrogen receptor positive status [ER+]: Secondary | ICD-10-CM

## 2024-08-27 LAB — CMP (CANCER CENTER ONLY)
ALT: 15 U/L (ref 0–44)
AST: 18 U/L (ref 15–41)
Albumin: 3.8 g/dL (ref 3.5–5.0)
Alkaline Phosphatase: 92 U/L (ref 38–126)
Anion gap: 3 — ABNORMAL LOW (ref 5–15)
BUN: 13 mg/dL (ref 6–20)
CO2: 30 mmol/L (ref 22–32)
Calcium: 9.5 mg/dL (ref 8.9–10.3)
Chloride: 106 mmol/L (ref 98–111)
Creatinine: 0.82 mg/dL (ref 0.44–1.00)
GFR, Estimated: >60 mL/min (ref >60)
Glucose, Bld: 99 mg/dL (ref 70–99)
Potassium: 4.2 mmol/L (ref 3.5–5.1)
Sodium: 139 mmol/L (ref 135–145)
Total Bilirubin: 0.2 mg/dL (ref 0.0–1.2)
Total Protein: 6.4 g/dL — ABNORMAL LOW (ref 6.5–8.1)

## 2024-08-27 LAB — CBC WITH DIFFERENTIAL (CANCER CENTER ONLY)
Abs Immature Granulocytes: 0.01 K/uL (ref 0.00–0.07)
Basophils Absolute: 0 K/uL (ref 0.0–0.1)
Basophils Relative: 1 %
Eosinophils Absolute: 0 K/uL (ref 0.0–0.5)
Eosinophils Relative: 1 %
HCT: 34.2 % — ABNORMAL LOW (ref 36.0–46.0)
Hemoglobin: 11.9 g/dL — ABNORMAL LOW (ref 12.0–15.0)
Immature Granulocytes: 0 %
Lymphocytes Relative: 26 %
Lymphs Abs: 0.8 K/uL (ref 0.7–4.0)
MCH: 32.7 pg (ref 26.0–34.0)
MCHC: 34.8 g/dL (ref 30.0–36.0)
MCV: 94 fL (ref 80.0–100.0)
Monocytes Absolute: 0.2 K/uL (ref 0.1–1.0)
Monocytes Relative: 7 %
Neutro Abs: 2.1 K/uL (ref 1.7–7.7)
Neutrophils Relative %: 65 %
Platelet Count: 279 K/uL (ref 150–400)
RBC: 3.64 MIL/uL — ABNORMAL LOW (ref 3.87–5.11)
RDW: 12.3 % (ref 11.5–15.5)
WBC Count: 3.2 K/uL — ABNORMAL LOW (ref 4.0–10.5)
nRBC: 0 % (ref 0.0–0.2)

## 2024-08-27 MED ORDER — LORAZEPAM 0.5 MG PO TABS
0.5000 mg | ORAL_TABLET | Freq: Two times a day (BID) | ORAL | 3 refills | Status: AC | PRN
  Filled 2024-08-27 – 2024-09-20 (×3): qty 30, 15d supply, fill #0

## 2024-08-27 NOTE — Assessment & Plan Note (Signed)
 07/19/2023:Palpable left breast mass for 6 months.  Mammogram and ultrasound revealed large masses.   10:30 position: 4.3 cm with skin thickening and ulceration: Grade 2 IDC with high-grade DCIS ER 95%, PR 95%, Ki67 30%, HER2 2+ by IHC, FISH negative ratio 1.37;  5 o'clock position: 3.3 cm with calcifications spanning 6.7 cm, axilla negative, biopsy: Grade 3 IDC with necrosis ER 95% PR 90% Ki-67 60% HER2 1+ negative   CT CAP 08/03/2023: 2 prominent left breast masses, left axillary lymph nodes, right hepatic lobe lesion concerning for metastatic breast cancer Ultrasound a liver biopsy scheduled for 08/17/2023   Treatment plan: Neoadjuvant chemotherapy with dose Adriamycin  and Cytoxan  followed by Taxol  completed 12/27/2023 Mastectomy left breast 01/24/2024 Adjuvant radiation completed 04/16/24 Liver directed therapy (underwent microwave ablation 01/16/2024) Antiestrogen therapy with CDK inhibitors started 06/25/24 ---------------------------------------------------------------------------------------------------- MRI liver 12/26/2023: Significantly diminished size of hepatic metastases (0.6 cm, 1.4 cm, (used to be 3.6 cm), 0.7 cm) MRI breast 12/31/2023: Significant positive response to chemo.  Both left malignancies decrease in size (3 cm with enhancement) and 1.1 cm CT CAP 12/26/2023: Significant decrease in breast masses, resolution of left axillary lymph nodes, decrease liver metastases   01/24/24: Bilateral Mastectomies Right: Benign Left: 2 residual tumors 2.8 cm and 1.6 cm, Margins neg, DCIS, ER 85%, PR: 25%, Her 2: 1+ Neg, Ki 67: 10% 06/04/2023: Hysterectomy and bilateral salpingo-oophorectomy    Current treatment:: Anti-estrogen therapy with Verzenio  started 06/26/2024 Adverse effects: Mild diarrhea: Managing well. Will keep her at 100 mg bid dose Neuropenia: Monitoring for now Severe fatigue: Advised to take B 12 supplements   Liver MRI 04/12/2024: Stable 6 mm subcapsular left hepatic dome  lesion similar, wedge-shaped deformity right hepatic lobe 1 x 2 cm without enhancement (treated metastatic lesion)   Return to clinic in 1 month with scans and follow-up after

## 2024-08-27 NOTE — Progress Notes (Signed)
 Patient Care Team: [name], MD as PCP - General (Hematology and Oncology) Obgyn, Anna as Consulting Physician (Obstetrics and Gynecology) Onetha Kuba, MD as Consulting Physician (Neurosurgery) Tyree Nanetta SAILOR, RN as Oncology Nurse Navigator [name], MD as Consulting Physician (Hematology and Oncology) Aron Shoulders, MD as Consulting Physician (General Surgery) Izell Domino, MD as Attending Physician (Radiation Oncology)  DIAGNOSIS:  Encounter Diagnosis  Name Primary?   Malignant neoplasm of upper-inner quadrant of left breast in female, estrogen receptor positive (HCC) Yes    SUMMARY OF ONCOLOGIC HISTORY: Oncology History  Malignant neoplasm of upper-inner quadrant of left female breast (HCC)  07/19/2023 Initial Diagnosis   Palpable left breast mass for 6 months.  Mammogram and ultrasound revealed large masses.  10:30 position: 4.3 cm with skin thickening and ulceration: Grade 2 IDC with high-grade DCIS ER 95%, PR 95%, Ki67 30%, HER2 2+ by IHC, FISH negative ratio 1.37; 5 o'clock position: 3.3 cm with calcifications spanning 6.7 cm, axilla negative, biopsy: Grade 3 IDC with necrosis ER 95% PR 90% Ki-67 60% HER2 1+ negative   07/26/2023 Cancer Staging   Staging form: Breast, AJCC 8th Edition - Clinical: Stage IIIB (cT4b, cN0, cM0, G3, ER+, PR+, HER2-) - Signed by [name], MD on 07/26/2023 Histologic grading system: 3 grade system   08/15/2023 - 12/27/2023 Chemotherapy   Patient is on Treatment Plan : BREAST ADJUVANT DOSE DENSE AC q14d / PACLitaxel  q7d     08/17/2023 Initial Biopsy   Liver biopsy: consistent with metastatic carcinoma, breast primary.     08/21/2023 Cancer Staging   Staging form: Breast, AJCC 8th Edition - Pathologic: Stage IV (pM1) - Signed by Crawford Morna Pickle, NP on 08/21/2023     CHIEF COMPLIANT: Breast cancer metastatic disease to liver  HISTORY OF PRESENT ILLNESS:   History of Present Illness Ann Daugherty is a  53 year old female who presents for follow-up regarding Y90 embolization treatment.  She is scheduled for a Y90 embolization on November 5th to treat a small liver lesion. The lesion remains small, and she prefers to address it while it is still manageable due to the physician's upcoming relocation.  She recently restarted her medication after a two-week break due to low blood counts. Upon resuming, she experienced diarrhea, nausea, headaches, and tiredness. Recent labs showed an increase in her white count to 3.2 from 2.6 and 1.9, and her hemoglobin is 12.  She continues to take anastrozole  and is concerned about stopping it, even temporarily. There has been a recent increase in her tumor marker from 17 to 23. A CT scan is planned for December, following her liver procedure, to monitor her condition further.  She is actively managing her health, including returning to the gym for weight training to regain muscle strength. She has undergone multiple procedures over the past year, including a hysterectomy and port placement, with her scars healing well.     ALLERGIES:  has no known allergies.  MEDICATIONS:  Current Outpatient Medications  Medication Sig Dispense Refill   abemaciclib  (VERZENIO ) 100 MG tablet Take 1 tablet (100 mg total) by mouth 2 (two) times daily. 60 tablet 3   acetaminophen  (TYLENOL ) 500 MG tablet Take 2 tablets (1,000 mg total) by mouth every 6 (six) hours. 60 tablet 0   anastrozole  (ARIMIDEX ) 1 MG tablet Take 1 tablet (1 mg total) by mouth daily. 90 tablet 3   calcium carbonate (TUMS - DOSED IN MG ELEMENTAL CALCIUM) 500 MG chewable tablet Chew 1 tablet  by mouth daily as needed (low calcium).     Cholecalciferol (VITAMIN D3) 5000 units CAPS Take 5,000 Units by mouth daily.     citalopram  (CELEXA ) 40 MG tablet Take 1 tablet (40 mg total) by mouth daily. 90 tablet 1   Cranberry 500 MG TABS Take 1 tablet by mouth 3 (three) times a week.     Cyanocobalamin (VITAMIN B-12) 1000  MCG SUBL Place 1,000 mcg under the tongue daily.     LORazepam  (ATIVAN ) 0.5 MG tablet Take 1 tablet (0.5 mg total) by mouth 2 (two) times daily as needed for anxiety. 30 tablet 3   No current facility-administered medications for this visit.    PHYSICAL EXAMINATION: ECOG PERFORMANCE STATUS: 1 - Symptomatic but completely ambulatory  Vitals:   08/27/24 0937  BP: (!) 108/52  Pulse: 70  Resp: 16  Temp: 98 F (36.7 C)  SpO2: 100%   Filed Weights   08/27/24 0937  Weight: 142 lb 12.8 oz (64.8 kg)      LABORATORY DATA:  I have reviewed the data as listed    Latest Ref Rng & Units 08/27/2024    9:08 AM 08/13/2024    7:52 AM 08/06/2024    8:27 AM  CMP  Glucose 70 - 99 mg/dL 99  99  95   BUN 6 - 20 mg/dL 13  15  9    Creatinine 0.44 - 1.00 mg/dL 9.17  9.41  9.06   Sodium 135 - 145 mmol/L 139  140  139   Potassium 3.5 - 5.1 mmol/L 4.2  3.5  4.1   Chloride 98 - 111 mmol/L 106  107  105   CO2 22 - 32 mmol/L 30  24  29    Calcium 8.9 - 10.3 mg/dL 9.5  8.7  9.3   Total Protein 6.5 - 8.1 g/dL 6.4  6.5  6.8   Total Bilirubin 0.0 - 1.2 mg/dL 0.2  0.4  0.4   Alkaline Phos 38 - 126 U/L 92  75  88   AST 15 - 41 U/L 18  20  18    ALT 0 - 44 U/L 15  15  14      Lab Results  Component Value Date   WBC 3.2 (L) 08/27/2024   HGB 11.9 (L) 08/27/2024   HCT 34.2 (L) 08/27/2024   MCV 94.0 08/27/2024   PLT 279 08/27/2024   NEUTROABS 2.1 08/27/2024    ASSESSMENT & PLAN:  Malignant neoplasm of upper-inner quadrant of left female breast (HCC) 07/19/2023:Palpable left breast mass for 6 months.  Mammogram and ultrasound revealed large masses.   10:30 position: 4.3 cm with skin thickening and ulceration: Grade 2 IDC with high-grade DCIS ER 95%, PR 95%, Ki67 30%, HER2 2+ by IHC, FISH negative ratio 1.37;  5 o'clock position: 3.3 cm with calcifications spanning 6.7 cm, axilla negative, biopsy: Grade 3 IDC with necrosis ER 95% PR 90% Ki-67 60% HER2 1+ negative   CT CAP 08/03/2023: 2 prominent left  breast masses, left axillary lymph nodes, right hepatic lobe lesion concerning for metastatic breast cancer Ultrasound a liver biopsy scheduled for 08/17/2023   Treatment plan: Neoadjuvant chemotherapy with dose Adriamycin  and Cytoxan  followed by Taxol  completed 12/27/2023 Mastectomy left breast 01/24/2024 Adjuvant radiation completed 04/16/24 Liver directed therapy (underwent microwave ablation 01/16/2024) Antiestrogen therapy with CDK inhibitors started 06/25/24 ---------------------------------------------------------------------------------------------------- MRI liver 12/26/2023: Significantly diminished size of hepatic metastases (0.6 cm, 1.4 cm, (used to be 3.6 cm), 0.7 cm) MRI breast 12/31/2023: Significant positive response  to chemo.  Both left malignancies decrease in size (3 cm with enhancement) and 1.1 cm CT CAP 12/26/2023: Significant decrease in breast masses, resolution of left axillary lymph nodes, decrease liver metastases   01/24/24: Bilateral Mastectomies Right: Benign Left: 2 residual tumors 2.8 cm and 1.6 cm, Margins neg, DCIS, ER 85%, PR: 25%, Her 2: 1+ Neg, Ki 67: 10% 06/04/2023: Hysterectomy and bilateral salpingo-oophorectomy    Current treatment:: Anti-estrogen therapy with Verzenio  started 06/26/2024 Adverse effects: Mild diarrhea: Managing well. Will keep her at 100 mg bid dose Neuropenia: Monitoring for now Severe fatigue: Advised to take B 12 supplements   Liver MRI 04/12/2024: Stable 6 mm subcapsular left hepatic dome lesion similar, wedge-shaped deformity right hepatic lobe 1 x 2 cm without enhancement (treated metastatic lesion)   09/08/2024: Patient will be undergoing embolization procedure with Dr. Karalee Scans in December and follow-up after Assessment & Plan Estrogen receptor positive left breast cancer with liver metastasis Undergoing treatment with improved blood counts after a two-week break from Abemaciclib , which has been restarted. Tumor marker  increased from 17 to 23, not significant. - Proceed with Y90 embolization on November 5th. - Hold Abemaciclib  one week before and one week after the procedure. - Continue Anastrozole  therapy. - Plan CT scan in December to assess treatment response. - Refill Lorazepam  prescription.  Gastrointestinal symptoms related to cancer therapy Experiencing diarrhea and nausea after restarting Abemaciclib . Symptoms expected to improve as her body readjusts to the medication.  Fatigue related to cancer therapy Reports fatigue likely related to cancer therapy. - Engaging in weight training to improve muscle strength and overall well-being.  Plan is for scans to be done in December    Orders Placed This Encounter  Procedures   CT CHEST ABDOMEN PELVIS W CONTRAST    Standing Status:   Future    Expected Date:   10/22/2024    Expiration Date:   08/27/2025    If indicated for the ordered procedure, I authorize the administration of contrast media per Radiology protocol:   Yes    Does the patient have a contrast media/X-ray dye allergy?:   No    Preferred imaging location?:   [facility]    Release to patient:   Immediate    If indicated for the ordered procedure, I authorize the administration of oral contrast media per Radiology protocol:   No    Reason for no oral contrast::   breast   CBC with Differential (Cancer Center Only)    Standing Status:   Future    Expiration Date:   08/27/2025   CMP (Cancer Center only)    Standing Status:   Future    Expiration Date:   08/27/2025   CA 27.29    Standing Status:   Future    Expiration Date:   08/27/2025   The patient has a good understanding of the overall plan. she agrees with it. she will call with any problems that may develop before the next visit here.  I personally spent a total of 45 minutes in the care of the patient today including preparing to see the patient, getting/reviewing separately obtained history, performing a medically  appropriate exam/evaluation, counseling and educating, placing orders, referring and communicating with other health care professionals, documenting clinical information in the EHR, independently interpreting results, communicating results, and coordinating care.   Viinay K Ashaunti Treptow, MD 08/27/24

## 2024-08-28 LAB — CANCER ANTIGEN 27.29: CA 27.29: 22.6 U/mL (ref 0.0–38.6)

## 2024-09-01 ENCOUNTER — Other Ambulatory Visit (HOSPITAL_COMMUNITY): Payer: Self-pay

## 2024-09-03 ENCOUNTER — Other Ambulatory Visit: Payer: Self-pay

## 2024-09-03 DIAGNOSIS — Z9011 Acquired absence of right breast and nipple: Secondary | ICD-10-CM | POA: Diagnosis not present

## 2024-09-03 NOTE — Progress Notes (Signed)
 Specialty Pharmacy Refill Coordination Note  Ann Daugherty is a 53 y.o. female contacted today regarding refills of specialty medication(s) Abemaciclib  (VERZENIO )   Patient requested [name] at [facility] at [place] date: 09/17/24   Medication will be filled on: 09/16/24

## 2024-09-08 ENCOUNTER — Other Ambulatory Visit (HOSPITAL_COMMUNITY): Payer: Self-pay | Admitting: Radiology

## 2024-09-08 ENCOUNTER — Other Ambulatory Visit (HOSPITAL_COMMUNITY): Payer: Self-pay

## 2024-09-08 DIAGNOSIS — Z9011 Acquired absence of right breast and nipple: Secondary | ICD-10-CM | POA: Diagnosis not present

## 2024-09-08 DIAGNOSIS — C50112 Malignant neoplasm of central portion of left female breast: Secondary | ICD-10-CM | POA: Diagnosis not present

## 2024-09-08 NOTE — H&P (Signed)
 Chief Complaint: Metastatic breast to sement 4 of the liver. Patient Presents for percutaneous thermal ablation.   Referring Physician(s): Mackey Chad  Supervising Physician: Karalee Beat  Patient Status: [facility] - Out-pt  History of Present Illness: Ann Daugherty is a 53 y.o. female outpatient. Known to IR. History of metastatic breast cancer ( to the segment 4 of the liver) s/p neoadjuvant chemotherapy and MWA ablation performed by IR on 3.12.25. Found to have enlarging left hepatic lobe lesion. The patient was seen for consultation in the Interventional Radiology Clinic  on 9.23.25 with IR Attending Dr. Beat Karalee. At that time a detailed discussion regarding the Patient's medical condition including but not limited to possible treatment options took place. Following that discussion the Patient elected to proceed with Y90. Pre-mapping was performed on 10.8.25. SABRA The Patient presents today for percutaneous thermal ablation.   Currently without any significant complaints. Patient alert and laying in bed,calm. Denies any fevers, headache, chest pain, SOB, cough, abdominal pain, nausea, vomiting or bleeding.   *** All labs and medications are within acceptable parameters. NKDA. Patient has been NPO since midnight.   Return precautions and treatment recommendations and follow-up discussed with the patient *** who is agreeable with the plan.    Past Medical History:  Diagnosis Date   ADD (attention deficit disorder)    Anemia    Breast cancer metastasized to liver, unspecified laterality (HCC) 08/2023   01-16-2024 s/p microwave ablation large liver lesion w/ residual small lesion   Depression    GAD (generalized anxiety disorder)    GERD (gastroesophageal reflux disease)    History of antineoplastic chemotherapy 08/2023   08-15-2023  to  20-20-2025   History of external beam radiation therapy 01/24/2024   left chest wall   s/p bilateral mastectomy   01-24-2024  to   04-16-2024   Malignant neoplasm of upper-inner quadrant of left breast in female, estrogen receptor positive (HCC) 07/2023   oncologist-- dr catrina [facility]yn oncology--- dr forsyth/ surgeon -- dr aron;  dx 07/2023  HGDCIS/ ICDG2; liver bx confirmed mets ; Stage IV;  completed chemo 02/ 2025; 03/ 2025 s/p bilateral mastectomy; completed IMRT 04-16-2024; scheduled for Hystectomy w/ BSO 06-03-2024   Pap smear abnormality of cervix with LGSIL 05/20/2024   w/ + HPV   PONV (postoperative nausea and vomiting)    Port-A-Cath in place 08/14/2023    Past Surgical History:  Procedure Laterality Date   BREAST BIOPSY Left 07/19/2023   US  LT BREAST BX W LOC DEV EA ADD LESION IMG BX SPEC US  GUIDE 07/19/2023 [facility]   BREAST BIOPSY Left 07/19/2023   US  LT BREAST BX W LOC DEV 1ST LESION IMG BX SPEC US  GUIDE 07/19/2023 [facility]   BREAST BIOPSY Right 08/09/2023   US  RT BREAST BX W LOC DEV 1ST LESION IMG BX SPEC US  GUIDE 08/09/2023 [facility]   BREAST BIOPSY Right 08/09/2023   US  RT BREAST BX W LOC DEV EA ADD LESION IMG BX SPEC US  GUIDE 08/09/2023 [facility]   CYSTOSCOPY N/A 06/03/2024   Procedure: CYSTOSCOPY;  Surgeon: Erik Kieth BROCKS, MD;  Location: [facility] OR;  Service: Gynecology;  Laterality: N/A;   DILATION AND CURETTAGE OF UTERUS  1990   IR ANGIOGRAM VISCERAL SELECTIVE  08/13/2024   IR GUIDED LIVER TUMOR ABLATION RFA PERC     IR RADIOLOGIST EVAL & MGMT  01/08/2024   IR RADIOLOGIST EVAL & MGMT  01/31/2024   IR RADIOLOGIST EVAL & MGMT  04/17/2024  IR RADIOLOGIST EVAL & MGMT  07/29/2024   LAMINECTOMY AND MICRODISCECTOMY LUMBAR SPINE  2011   by dr onetha;   re-do L4--5   LUMBAR LAMINECTOMY/DECOMPRESSION WITH DISCECTOMY  09/17/2006   @[facility]  by dr onetha;   L4-5   PORTACATH PLACEMENT Right 08/14/2023   Procedure: INSERTION PORT-A-CATH WITH ULTRASOUND ANDREA;  Surgeon: Aron Shoulders, MD;  Location: [facility] OR;  Service: General;  Laterality: Right;   SIMPLE MASTECTOMY WITH AXILLARY  SENTINEL NODE BIOPSY Bilateral 01/24/2024   Procedure: BILATERAL MASTECTOMY;  Surgeon: Aron Shoulders, MD;  Location: [facility];  Service: General;  Laterality: Bilateral;  PEC BLOCK   TOTAL LAPAROSCOPIC HYSTERECTOMY WITH BILATERAL SALPINGO OOPHORECTOMY Bilateral 06/03/2024   Procedure: HYSTERECTOMY, TOTAL, LAPAROSCOPIC, WITH BILATERAL SALPINGO-OOPHORECTOMY;  Surgeon: Erik Kieth BROCKS, MD;  Location: [facility] OR;  Service: Gynecology;  Laterality: Bilateral;  TLH/BSO/cysto    Allergies: Patient has no known allergies.  Medications: Prior to Admission medications   Medication Sig Start Date End Date Taking? Authorizing Provider  abemaciclib  (VERZENIO ) 100 MG tablet Take 1 tablet (100 mg total) by mouth 2 (two) times daily. 06/19/24   Gudena, Vinay, MD  acetaminophen  (TYLENOL ) 500 MG tablet Take 2 tablets (1,000 mg total) by mouth every 6 (six) hours. 06/03/24   Erik Kieth BROCKS, MD  anastrozole  (ARIMIDEX ) 1 MG tablet Take 1 tablet (1 mg total) by mouth daily. 06/19/24   Crawford Morna Pickle, NP  calcium carbonate (TUMS - DOSED IN MG ELEMENTAL CALCIUM) 500 MG chewable tablet Chew 1 tablet by mouth daily as needed (low calcium).    [provider]  Cholecalciferol (VITAMIN D3) 5000 units CAPS Take 5,000 Units by mouth daily. 12/07/16   [provider]  citalopram  (CELEXA ) 40 MG tablet Take 1 tablet (40 mg total) by mouth daily. 04/22/24   Causey, Morna Pickle, NP  Cranberry 500 MG TABS Take 1 tablet by mouth 3 (three) times a week.    [provider]  Cyanocobalamin (VITAMIN B-12) 1000 MCG SUBL Place 1,000 mcg under the tongue daily.    [provider]  LORazepam  (ATIVAN ) 0.5 MG tablet Take 1 tablet (0.5 mg total) by mouth 2 (two) times daily as needed for anxiety. 08/27/24   [name], MD     Family History  Problem Relation Age of Onset   Breast cancer Mother 31   Colon cancer Mother 69   Asthma Mother    Hypertension Mother     Hearing loss Mother    Hypertension Father    Diabetes Father    Hyperlipidemia Father    Learning disabilities Son    ADD / ADHD Son    Ovarian cancer Maternal Grandmother    Hypertension Maternal Grandfather    Hyperlipidemia Maternal Grandfather    Heart disease Maternal Grandfather    Stroke Maternal Grandfather    Mental illness Paternal Grandmother    Hypertension Paternal Grandfather     Social History   Socioeconomic History   Marital status: Divorced    Spouse name: Not on file   Number of children: 2   Years of education: Not on file   Highest education level: Not on file  Occupational History   Not on file  Tobacco Use   Smoking status: Former    Current packs/day: 0.00    Average packs/day: 0.5 packs/day for 30.0 years (15.0 ttl pk-yrs)    Types: Cigarettes    Start date: 04/07/1991    Quit date: 04/06/2021    Years since quitting: 3.4  Smokeless tobacco: Never  Vaping Use   Vaping status: Every Day   Substances: Nicotine   Devices: jewel  (pt stated started in 2022)  Substance and Sexual Activity   Alcohol use: Not Currently   Drug use: Not Currently    Comment: 05-28-2024  CBD gummies , last time 2022   Sexual activity: Not Currently    Birth control/protection: None  Other Topics Concern   Not on file  Social History Narrative   2  sons: 36 y.o. and 35 y.o. : 70 y.o. son lives with pt. 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   Social Drivers of Health   Financial Resource Strain: Not on file  Food Insecurity: Food Insecurity Present (02/19/2024)   Hunger Vital Sign    Worried About Running Out of Food in the Last Year: Sometimes true    Ran Out of Food in the Last Year: Sometimes true  Transportation Needs: No Transportation Needs (02/19/2024)   PRAPARE - Administrator, Civil Service (Medical): No    Lack of Transportation (Non-Medical): No  Physical Activity: Not on file  Stress: Not on file  Social Connections: Not on file     ECOG Status: {CHL ONC ECOG ED:[id number]}  Review of Systems: A 12 point ROS discussed and pertinent positives are indicated in the HPI above.  All other systems are negative.  Review of Systems  Vital Signs: There were no vitals taken for this visit.    Physical Exam  Imaging: IR Angiogram Visceral Selective Result Date: 08/13/2024 INDICATION: 53 year old female with a history of breast cancer metastatic to the liver. She has a small enhancing lesion in the anterior subcapsular aspect of hepatic segment 4 a. the lesion is not visible or approachable for percutaneous thermal ablation so she presents for pre Y 90 arteriography in anticipation of Y 90 segmentectomy. EXAM: SELECTIVE VISCERAL ARTERIOGRAPHY MEDICATIONS: None ANESTHESIA/SEDATION: Moderate (conscious) sedation was employed during this procedure. A total of Versed  2 mg and Fentanyl  100 mcg was administered intravenously by the Radiology nurse. Moderate Sedation Time: 90 minutes. The patient's level of consciousness and vital signs were monitored continuously by radiology nursing throughout the procedure under my direct supervision. CONTRAST:  75mL OMNIPAQUE  IOHEXOL  300 MG/ML  SOLN FLUOROSCOPY: Radiation Exposure Index (as provided by the fluoroscopic device): 435 mGy Kerma COMPLICATIONS: None immediate. PROCEDURE: Informed consent was obtained from the patient following explanation of the procedure, risks, benefits and alternatives. The patient understands, agrees and consents for the procedure. All questions were addressed. A time out was performed prior to the initiation of the procedure. Maximal barrier sterile technique utilized including caps, mask, sterile gowns, sterile gloves, large sterile drape, hand hygiene, and Betadine  prep. The right superficial femoral artery was interrogated with ultrasound and found to be widely patent. An image was obtained and stored for the medical record. Local anesthesia was attained by  infiltration with 1% lidocaine . A small dermatotomy was made. Under real-time sonographic guidance, the vessel was punctured with a 21 gauge micropuncture needle. Using standard technique, the initial micro needle was exchanged over a 0.018 micro wire for a transitional 4 French micro sheath. The micro sheath was then exchanged over a 0.035 wire for a 5 French vascular sheath. A C2 catheter was advanced over a Bentson wire and used to select the celiac artery. A celiac arteriogram was performed. The celiac artery is steeply downsloping. Efforts were made to advance the catheter into the hepatic artery but this was  unsuccessful. Therefore, the C2 cobra catheter was exchanged for a reverse curve Sos Omni selective catheter this was again used to select the celiac artery and arteriography was performed. No definite hyperenhancement identified. A Cook Cantata 2.5 French microcatheter was advanced over a Fathom 16 wire into the proper hepatic artery and arteriography was performed. The anatomy of the right and left hepatic arteries appears conventional. The segment 4 artery arises from the left hepatic artery. No aberrant anatomy or evidence of a right gastric artery. The catheter was next advanced into the left hepatic artery. Arteriography was performed in multiple obliquities. The segment 2, segment 3 and segment 4 arteries are well visualized. No evidence of gastric collaterals or accessory gastric arteries. The microcatheter was further advanced into the segment 4 artery. Arteriography was performed. Several branches extend into segment 4 B. the branch extending cephalad into segment 4 a was identified. The microcatheter was finally advanced into the segment 4 A artery. Arteriography was performed and cone beam CT imaging obtained. Cone beam CT images were reviewed independently on the 3D workstation. Parenchymal enhancement appears to overlap with the expected location of the lesion from prior MR imaging. There was  some question if the parenchymal opacification extended medially enough. Therefore, the microcatheter was brought back more proximally within the origin of the segment 4 artery. Repeat arteriography with cone beam CT imaging was performed. These images were again reviewed independently on the 3D workstation. Finally, technetium labeled macro aggregated albumin was injected through the catheter into the segment 4 hepatic artery. The catheter system was then removed. Hemostasis was obtained with the assistance of a Celt arterial closure device. IMPRESSION: 1. Successful multi selective hepatic arteriogram. 2. The segment 4 a hepatic artery appears to opacify the parenchyma overlapping with the territory of the lesion seen on prior MRI. 3. No evidence of significant anatomic variation, right gastric artery or accessory gastric artery. Electronically Signed   By: Wilkie Lent M.D.   On: 08/13/2024 15:13   NM PRE Y90 LIVER SPECT CT LUNG SHUNT ASSESSMENT Result Date: 08/13/2024 CLINICAL DATA:  Breast carcinoma. New LEFT hepatic lobe lesion. Pre yttrium 90 radio embolization arterial mapping. EXAM: NM PRE Y90 LIVER SPECT LUNG SHUNT ASSESSMENT TECHNIQUE: Abdominal images were obtained in multiple projections after intrahepatic arterial injection of radiopharmaceutical. SPECT imaging was performed. Lung shunt calculation was performed. RADIOPHARMACEUTICALS:  4.71millicurie MAA TECHNETIUM TO 36M ALBUMIN AGGREGATED COMPARISON:  MRI 07/25/2024 and CT chest 05/29/2024 FINDINGS: The injected microaggregated albumin localizes within segment 4A of the LEFT hepatic lobe. No evidence of activity within the stomach, duodenum, or bowel. Calculated shunt fraction to the lungs equals  2%. IMPRESSION: 1. No significant extrahepatic radiotracer activity following intrahepatic arterial injection of MAA. 2. Lung shunt fraction equals 2%% Electronically Signed   By: Jackquline Boxer M.D.   On: 08/13/2024 13:45     Labs:  CBC: Recent Labs    07/23/24 0832 08/06/24 0827 08/13/24 0752 08/27/24 0908  WBC 2.2* 1.9* 2.6* 3.2*  HGB 12.1 12.6 11.4* 11.9*  HCT 36.3 35.6* 33.4* 34.2*  PLT 254 238 241 279    COAGS: Recent Labs    01/16/24 0819 08/13/24 0752  INR 1.0 1.0    BMP: Recent Labs    07/23/24 0832 08/06/24 0827 08/13/24 0752 08/27/24 0908  NA 139 139 140 139  K 4.0 4.1 3.5 4.2  CL 103 105 107 106  CO2 30 29 24 30   GLUCOSE 102* 95 99 99  BUN 14 9 15  13  CALCIUM 9.4 9.3 8.7* 9.5  CREATININE 0.78 0.93 0.58 0.82  GFRNONAA >60 >60 >60 >60    LIVER FUNCTION TESTS: Recent Labs    07/23/24 0832 08/06/24 0827 08/13/24 0752 08/27/24 0908  BILITOT 0.2 0.4 0.4 0.2  AST 17 18 20 18   ALT 14 14 15 15   ALKPHOS 90 88 75 92  PROT 7.0 6.8 6.5 6.4*  ALBUMIN 4.2 4.2 3.7 3.8    TUMOR MARKERS: No results for input(s): AFPTM, CEA, CA199, CHROMGRNA in the last 8760 hours.  Assessment and Plan:  53 y.o. female outpatient. Known to IR. History of metastatic breast caner ( to the segment 4 of the liver) s/p neoadjuvant chemotherapy and MWA ablation performed by IR on 3.12.25. Found to have enlarging left hepatic lobe lesion. The patient was seen for consultation in the Interventional Radiology Clinic  on 9.23.25 with IR Attending Dr. Wilkie Lent. At that time a detailed discussion regarding the Patient's medical condition including but not limited to possible treatment options took place. Following that discussion the Patient elected to proceed with Y90. Pre-mapping was performed on 10.8.25. SABRA The Patient presents today for transarterial radiation segmentectomy.   PLAN: IR Image Guided Y90 percutaneous thermal ablation.   Risks and benefits discussed with the patient including, but not limited to bleeding, infection, vascular injury, post procedural pain, nausea, vomiting and fatigue, contrast induced renal failure, liver failure, radiation injury to the bowel, radiation  induced cholecystitis, neutropenia and possible need for additional procedures.  All of the patient's questions were answered, patient is agreeable to proceed. Consent signed and in chart.   Thank you for this interesting consult.  I greatly enjoyed meeting Ann Daugherty and look forward to participating in their care.  A copy of this report was sent to the requesting provider on this date.  Electronically Signed: Delon JAYSON Beagle, NP 09/08/2024, 8:44 PM   I spent a total of {New PWEU:[id number]} {New Out-Pt:[id number]}  {Established Out-Pt:[id number]} in face to face in clinical consultation, greater than 50% of which was counseling/coordinating care for ***

## 2024-09-08 NOTE — Progress Notes (Unsigned)
 [facility]       Telephone: [phone]?Fax: [phone]   Oncology Clinical Pharmacist Practitioner Progress Note  Ann Daugherty was contacted via in-person to discuss her chemotherapy regimen for abemaciclib  which they receive under the care of Dr. Vinay Gudena.  Current treatment regimen and start date Abemaciclib  (06/26/24) Anastrozole  (06/19/24)  Interval History She continues on abemaciclib  100 mg by mouth every 12 hours on days 1 to 28 of a 28-day cycle. This is being given in combination with anastrozole . Therapy is planned to continue until disease progression or unacceptable toxicity. She was last seen by Dr. Odean on 08/27/24 and clinical pharmacy on 08/06/24.   She has a procedure on 09/10/24 and Dr. Gudena discussed holding abemaciclib  for 7 days before (09/03/24) and 7 days after (09/17/24). Dr. Gudena is keeping her dose of abemaciclib  at 100 mg PO BID.  Response to Therapy Ann Daugherty is doing well.  We confirmed today that she has been holding the abemaciclib  for the last week.  Prior to that, she had stopped abemaciclib  on 08/06/2024 when clinical pharmacy saw her last.  She then restarted on 08/20/2024.  She knows to restart her abemaciclib  1 week after her procedure this time which will be 09/17/2024.  Her ANC is approximately 1500 cells per microliter today which is down from her last visit.  We will continue to monitor and hold if there is any grade 3 toxicities (< 1000).  Per Dr. Odean, we will remove the survivorship clinic visit on 11/03/2024 with Morna Kendall NP.  We gave Ann Daugherty the radiology scheduling number today and explained that Dr. Gudena put in a scan for 10/22/2024.  He will then see her 10/27/2024 review of screenings.  Clinical pharmacy will then see her 4 weeks after that with labs.  She does prefer labs to be performed.  Labs, vitals, treatment parameters, and manufacturer guidelines assessing toxicity were reviewed with  Ann Daugherty today. Based on these values, patient is in agreement to continue abemaciclib  therapy at this time.  Allergies No Known Allergies  Vitals    09/09/2024    9:12 AM 08/27/2024    9:37 AM 08/13/2024   12:24 PM  Oncology Vitals  Height  168 cm   Weight 65.573 kg 64.774 kg   Weight (lbs) 144 lbs 9 oz 142 lbs 13 oz   BMI 23.33 kg/m2 23.05 kg/m2   Temp 98 F (36.7 C) 98 F (36.7 C)   Pulse Rate 72 70 64  BP 109/56 108/52 98/47  Resp 16 16 19   SpO2 100 % 100 % 99 %  BSA (m2) 1.75 m2 1.74 m2     Laboratory Data    Latest Ref Rng & Units 09/09/2024    8:41 AM 08/27/2024    9:08 AM 08/13/2024    7:52 AM  CBC EXTENDED  WBC 4.0 - 10.5 K/uL 2.6  3.2  2.6   RBC 3.87 - 5.11 MIL/uL 3.55  3.64  3.49   Hemoglobin 12.0 - 15.0 g/dL 88.2  88.0  88.5   HCT 36.0 - 46.0 % 33.5  34.2  33.4   Platelets 150 - 400 K/uL 203  279  241   NEUT# 1.7 - 7.7 K/uL 1.5  2.1  1.6   Lymph# 0.7 - 4.0 K/uL 0.7  0.8  0.7        Latest Ref Rng & Units 09/09/2024    8:41 AM 08/27/2024    9:08 AM 08/13/2024  7:52 AM  CMP  Glucose 70 - 99 mg/dL 896  99  99   BUN 6 - 20 mg/dL 12  13  15    Creatinine 0.44 - 1.00 mg/dL 9.21  9.17  9.41   Sodium 135 - 145 mmol/L 139  139  140   Potassium 3.5 - 5.1 mmol/L 3.8  4.2  3.5   Chloride 98 - 111 mmol/L 105  106  107   CO2 22 - 32 mmol/L 28  30  24    Calcium 8.9 - 10.3 mg/dL 9.2  9.5  8.7   Total Protein 6.5 - 8.1 g/dL 6.7  6.4  6.5   Total Bilirubin 0.0 - 1.2 mg/dL 0.3  0.2  0.4   Alkaline Phos 38 - 126 U/L 89  92  75   AST 15 - 41 U/L 21  18  20    ALT 0 - 44 U/L 15  15  15      Adverse Effects Assessment ANC: 1500 cells/uL today. Monitor and hold if < 1000 cells/uL.  Adherence Assessment Ann Daugherty reports missing 0 doses over the past 2 weeks due to adherence. Has been holding due to procedure tomorrow as stated above. Reason for missed dose: n/a Patient was re-educated on importance of adherence.   Access Assessment Ann Daugherty  is currently receiving her abemaciclib  through [facility] concerns:  none  Medication Reconciliation The patient's medication list was reviewed today with the patient? Yes New medications or herbal supplements have recently been started? No  Any medications have been discontinued? No  The medication list was updated and reconciled based on the patient's most recent medication list in the electronic medical record (EMR) including herbal products and OTC medications.   Medications Current Outpatient Medications  Medication Sig Dispense Refill   abemaciclib  (VERZENIO ) 100 MG tablet Take 1 tablet (100 mg total) by mouth 2 (two) times daily. 60 tablet 3   acetaminophen  (TYLENOL ) 500 MG tablet Take 2 tablets (1,000 mg total) by mouth every 6 (six) hours. 60 tablet 0   anastrozole  (ARIMIDEX ) 1 MG tablet Take 1 tablet (1 mg total) by mouth daily. 90 tablet 3   calcium carbonate (TUMS - DOSED IN MG ELEMENTAL CALCIUM) 500 MG chewable tablet Chew 1 tablet by mouth daily as needed (low calcium).     Cholecalciferol (VITAMIN D3) 5000 units CAPS Take 5,000 Units by mouth daily.     citalopram  (CELEXA ) 40 MG tablet Take 1 tablet (40 mg total) by mouth daily. 90 tablet 1   Cranberry 500 MG TABS Take 1 tablet by mouth 3 (three) times a week.     LORazepam  (ATIVAN ) 0.5 MG tablet Take 1 tablet (0.5 mg total) by mouth 2 (two) times daily as needed for anxiety. 30 tablet 3   Cyanocobalamin (VITAMIN B-12) 1000 MCG SUBL Place 1,000 mcg under the tongue daily.     No current facility-administered medications for this visit.    Drug-Drug Interactions (DDIs) DDIs were evaluated? Yes Significant DDIs? No  The patient was instructed to speak with their health care provider and/or the oral chemotherapy pharmacist before starting any new drug, including prescription or over the counter, natural / herbal products, or vitamins.  Supportive Care Diarrhea: we reviewed that diarrhea is  common with abemaciclib  and confirmed that she does have loperamide (Imodium) at home.  We reviewed how to take this medication PRN. Neutropenia: we discussed the importance of having a thermometer and what the Centers  for Disease Control and Prevention (CDC) considers a fever which is 100.65F (38C) or higher.  Gave patient 24/7 triage line to call if any fevers or symptoms. ILD/Pneumonitis: we reviewed potential symptoms including cough, shortness, and fatigue.  VTE: reviewed signs of DVT such as leg swelling, redness, pain, or tenderness and signs of PE such as shortness of breath, rapid or irregular heartbeat, cough, chest pain, or lightheadedness. Reviewed to take the medication every 12 hours (with food sometimes can be easier on the stomach) and to take it at the same time every day. Hepatotoxicity:WNL Drug interactions with grapefruit products  Dosing Assessment Hepatic adjustments needed? No  Renal adjustments needed? No  Toxicity adjustments needed? No  The current dosing regimen is appropriate to continue at this time.  Follow-Up Plan Continue to hold abemaciclib  100 mg by mouth every 12 hours due to liver procedure on 09/10/24. Restart on 09/17/24. Continue anastrozole  1 mg by mouth daily Restaging scans expected 10/22/24 have been ordered. Radiology scheduling number provided today in clinic. Will remove port flush labs and Morna Kendall NP visit on 11/03/24 We will add port flush labs and pharmacy clinic visit in 3 months (11/26/23) Ann Daugherty can follow up with clinical pharmacy as deemed necessary by Dr. Mackey Chad going forward   Ann Daugherty participated in the discussion, expressed understanding, and voiced agreement with the above plan. All questions were answered to her satisfaction. The patient was advised to contact the clinic at [phone] with any questions or concerns prior to her return visit.   I spent 30 minutes assessing and educating the  patient.  Ann Daugherty A. Lucila, PharmD, BCOP, CPP  Ann Daugherty Lucila, RPH-CPP, 09/09/2024  9:36 AM   **Disclaimer: This note was dictated with voice recognition software. Similar sounding words can inadvertently be transcribed and this note may contain transcription errors which may not have been corrected upon publication of note.**

## 2024-09-09 ENCOUNTER — Inpatient Hospital Stay

## 2024-09-09 ENCOUNTER — Inpatient Hospital Stay: Attending: Adult Health

## 2024-09-09 ENCOUNTER — Other Ambulatory Visit (HOSPITAL_COMMUNITY): Payer: Self-pay

## 2024-09-09 ENCOUNTER — Inpatient Hospital Stay: Admitting: Pharmacist

## 2024-09-09 VITALS — BP 109/56 | HR 72 | Temp 98.0°F | Resp 16 | Wt 144.6 lb

## 2024-09-09 DIAGNOSIS — C50212 Malignant neoplasm of upper-inner quadrant of left female breast: Secondary | ICD-10-CM

## 2024-09-09 DIAGNOSIS — Z170 Estrogen receptor positive status [ER+]: Secondary | ICD-10-CM | POA: Insufficient documentation

## 2024-09-09 LAB — CBC WITH DIFFERENTIAL (CANCER CENTER ONLY)
Abs Immature Granulocytes: 0.01 K/uL (ref 0.00–0.07)
Basophils Absolute: 0 K/uL (ref 0.0–0.1)
Basophils Relative: 1 %
Eosinophils Absolute: 0.1 K/uL (ref 0.0–0.5)
Eosinophils Relative: 4 %
HCT: 33.5 % — ABNORMAL LOW (ref 36.0–46.0)
Hemoglobin: 11.7 g/dL — ABNORMAL LOW (ref 12.0–15.0)
Immature Granulocytes: 0 %
Lymphocytes Relative: 27 %
Lymphs Abs: 0.7 K/uL (ref 0.7–4.0)
MCH: 33 pg (ref 26.0–34.0)
MCHC: 34.9 g/dL (ref 30.0–36.0)
MCV: 94.4 fL (ref 80.0–100.0)
Monocytes Absolute: 0.3 K/uL (ref 0.1–1.0)
Monocytes Relative: 11 %
Neutro Abs: 1.5 K/uL — ABNORMAL LOW (ref 1.7–7.7)
Neutrophils Relative %: 57 %
Platelet Count: 203 K/uL (ref 150–400)
RBC: 3.55 MIL/uL — ABNORMAL LOW (ref 3.87–5.11)
RDW: 12.5 % (ref 11.5–15.5)
WBC Count: 2.6 K/uL — ABNORMAL LOW (ref 4.0–10.5)
nRBC: 0 % (ref 0.0–0.2)

## 2024-09-09 LAB — CMP (CANCER CENTER ONLY)
ALT: 15 U/L (ref 0–44)
AST: 21 U/L (ref 15–41)
Albumin: 4 g/dL (ref 3.5–5.0)
Alkaline Phosphatase: 89 U/L (ref 38–126)
Anion gap: 6 (ref 5–15)
BUN: 12 mg/dL (ref 6–20)
CO2: 28 mmol/L (ref 22–32)
Calcium: 9.2 mg/dL (ref 8.9–10.3)
Chloride: 105 mmol/L (ref 98–111)
Creatinine: 0.78 mg/dL (ref 0.44–1.00)
GFR, Estimated: >60 mL/min (ref >60)
Glucose, Bld: 103 mg/dL — ABNORMAL HIGH (ref 70–99)
Potassium: 3.8 mmol/L (ref 3.5–5.1)
Sodium: 139 mmol/L (ref 135–145)
Total Bilirubin: 0.3 mg/dL (ref 0.0–1.2)
Total Protein: 6.7 g/dL (ref 6.5–8.1)

## 2024-09-09 NOTE — Research (Signed)
 D7794, ICE COMPRESS: RANDOMIZED TRIAL OF LIMB CRYOCOMPRESSION VERSUS CONTINUOUS COMPRESSION VERSUS LOW CYCLIC COMPRESSION FOR THE PREVENTION  OF TAXANE-INDUCED PERIPHERAL NEUROPATHY   Patient arrives today Unaccompanied for her week 52 follow up. Confirmed patient does not have wounds, sores, or lesions to extremities. Patient has not had any vaccinations since last visit.    ADVERSE EVENTS: Patient reports no adverse events from study intervention (Paxman machine). Pt denies any motor neuropathy symptoms, but reports ongoing sensory neuropathy symptoms in her feet bilaterally (right worse than left). Pt reports the mild numbness remains in right big toe but has greatly improved after Taxol  has ended. RN assessed pt hands and feet bilaterally, and they had normal color, temperature, and sensation.     PRO's:  Pt 52 week PROs completed.    Assessments:  Pt 52 week assessments (nueropen, tuning fork, get up and go) were completed.  Supplemental agents:  Pt reports taking vitamin B6 100mg  by mouth daily for fatigue. She does however feel this has helped her neuropathy as well. Pt denies taking any prescription drugs or using any alternative therapies for neuropathy.    SOLICITED ADVERSE EVENTS:    Adverse Event CTCAE Grade Relationship to Study Intervention Onset Resolved Action Taken Relationship to chemotherapy Comments  Skin atrophy 0 NA            Skin hyperpigmentation 0 NA            Skin hypopigmentation 0 NA            Skin induration 0 NA            Skin ulceration 0 NA            Rash maculopapular 0 NA            Nail changes 0 NA            Cold intolerance (general disorders and administration site conditions- other) 0 NA            Frostbite (skin and subcutaneous tissue disorders- other) 0 NA            Fatigue 1 Not related to paxman  machine     none   08-09-24, pt reports fatigue remains as she is requiring naps to perform ADL's at times. She does report feeling rested  after napping.   Peripheral Sensory Neuropathy   1 Not related to [employer].  11-27-23 per pt report   Paclitaxel  dose reduced on 11/28/23. Probably related to Taxol  per Dr. Odean.  Pt reports this mild numbness remains in right big toe but overall has greatly improved after Taxol  ended.     Disposition:  All study assessments completed and pt was walked to door by [employer]. Pt had no question or concerns at this time. Pt knows she can call research staff with any concerns. Research RN thanked pt for her participation in this study. Pt study participation is now complete.   Delon Pinal BSN RN Clinical Research Nurse [facility] Direct Dial: [phone] 09/09/2024  10:39 AM

## 2024-09-09 NOTE — Progress Notes (Signed)
 Patient for IR/NM Y-90 on Wed 09/10/24, I called and spoke with the patient on the phone and gave pre-procedure instructions. Pt was made aware to be here at 7:30a, NPO after MN prior to procedure as well as driver post procedure/recovery/discharge. Pt stated understanding.  Called  09/09/24

## 2024-09-10 ENCOUNTER — Ambulatory Visit: Admitting: Pharmacist

## 2024-09-10 ENCOUNTER — Other Ambulatory Visit (HOSPITAL_COMMUNITY): Payer: Self-pay | Admitting: Radiology

## 2024-09-10 ENCOUNTER — Other Ambulatory Visit

## 2024-09-10 ENCOUNTER — Encounter: Payer: Self-pay | Admitting: Radiology

## 2024-09-10 ENCOUNTER — Other Ambulatory Visit: Payer: Self-pay

## 2024-09-10 ENCOUNTER — Ambulatory Visit
Admission: RE | Admit: 2024-09-10 | Discharge: 2024-09-10 | Disposition: A | Discharge status: Home / Self Care | Source: Ambulatory Visit | Attending: Interventional Radiology | Admitting: Interventional Radiology

## 2024-09-10 ENCOUNTER — Encounter

## 2024-09-10 VITALS — BP 105/63 | HR 76 | Temp 98.2°F | Resp 20 | Ht 66.0 in | Wt 142.0 lb

## 2024-09-10 DIAGNOSIS — K769 Liver disease, unspecified: Secondary | ICD-10-CM

## 2024-09-10 DIAGNOSIS — Z87891 Personal history of nicotine dependence: Secondary | ICD-10-CM | POA: Insufficient documentation

## 2024-09-10 DIAGNOSIS — R160 Hepatomegaly, not elsewhere classified: Secondary | ICD-10-CM

## 2024-09-10 DIAGNOSIS — Z853 Personal history of malignant neoplasm of breast: Secondary | ICD-10-CM | POA: Diagnosis not present

## 2024-09-10 DIAGNOSIS — C50919 Malignant neoplasm of unspecified site of unspecified female breast: Secondary | ICD-10-CM | POA: Diagnosis not present

## 2024-09-10 DIAGNOSIS — C787 Secondary malignant neoplasm of liver and intrahepatic bile duct: Secondary | ICD-10-CM | POA: Insufficient documentation

## 2024-09-10 DIAGNOSIS — Z9221 Personal history of antineoplastic chemotherapy: Secondary | ICD-10-CM | POA: Insufficient documentation

## 2024-09-10 DIAGNOSIS — Z01818 Encounter for other preprocedural examination: Secondary | ICD-10-CM

## 2024-09-10 HISTORY — PX: IR EMBO TUMOR ORGAN ISCHEMIA INFARCT INC GUIDE ROADMAPPING: IMG5449

## 2024-09-10 LAB — CANCER ANTIGEN 27.29: CA 27.29: 27.1 U/mL (ref 0.0–38.6)

## 2024-09-10 LAB — PROTIME-INR
INR: 1.3 — ABNORMAL HIGH (ref 0.8–1.2)
Prothrombin Time: 17.3 s — ABNORMAL HIGH (ref 11.4–15.2)

## 2024-09-10 MED ORDER — IOHEXOL 300 MG/ML  SOLN
75.0000 mL | Freq: Once | INTRAMUSCULAR | Status: AC | PRN
  Administered 2024-09-10: 80 mL via INTRA_ARTERIAL

## 2024-09-10 MED ORDER — LIDOCAINE HCL 1 % IJ SOLN
INTRAMUSCULAR | Status: AC
  Filled 2024-09-10: qty 20

## 2024-09-10 MED ORDER — NITROGLYCERIN 1 MG/10 ML FOR IR/CATH LAB
2.0000 mL | Freq: Once | INTRA_ARTERIAL | Status: AC
  Administered 2024-09-10: 200 ug via INTRA_ARTERIAL

## 2024-09-10 MED ORDER — CELECOXIB 200 MG PO CAPS
200.0000 mg | ORAL_CAPSULE | Freq: Once | ORAL | Status: AC
  Administered 2024-09-10: 200 mg via ORAL
  Filled 2024-09-10: qty 1

## 2024-09-10 MED ORDER — NITROGLYCERIN 1 MG/10 ML FOR IR/CATH LAB
INTRA_ARTERIAL | Status: AC | PRN
  Administered 2024-09-10 (×2): 100 ug via INTRA_ARTERIAL

## 2024-09-10 MED ORDER — HEPARIN SODIUM (PORCINE) 1000 UNIT/ML IJ SOLN
INTRAMUSCULAR | Status: AC
  Filled 2024-09-10: qty 10

## 2024-09-10 MED ORDER — LIDOCAINE HCL 1 % IJ SOLN
1.0000 mL | Freq: Once | INTRAMUSCULAR | Status: AC
Start: 2024-09-10 — End: 2024-09-10
  Administered 2024-09-10: 1 mL via INTRADERMAL

## 2024-09-10 MED ORDER — SODIUM CHLORIDE 0.9 % IV SOLN
2.0000 g | INTRAVENOUS | Status: DC
  Filled 2024-09-10: qty 2

## 2024-09-10 MED ORDER — YTTRIUM 90 INJECTION
13.5000 | INJECTION | Freq: Once | INTRAVENOUS | Status: AC | PRN
  Administered 2024-09-10: 13.5 via INTRA_ARTERIAL

## 2024-09-10 MED ORDER — ACETAMINOPHEN 500 MG PO TABS: 1000.0000 mg | ORAL_TABLET | Freq: Once | ORAL | Status: AC

## 2024-09-10 MED ORDER — NITROGLYCERIN 1 MG/10 ML FOR IR/CATH LAB
INTRA_ARTERIAL | Status: AC
  Filled 2024-09-10: qty 10

## 2024-09-10 MED ORDER — HEPARIN SOD (PORK) LOCK FLUSH 100 UNIT/ML IV SOLN
INTRAVENOUS | Status: AC
  Filled 2024-09-10: qty 5

## 2024-09-10 MED ORDER — SODIUM CHLORIDE 0.9 % IV SOLN
INTRAVENOUS | Status: AC | PRN
  Administered 2024-09-10: 2 g via INTRAVENOUS

## 2024-09-10 MED ORDER — VERAPAMIL HCL 2.5 MG/ML IV SOLN
INTRAVENOUS | Status: AC
  Filled 2024-09-10: qty 2

## 2024-09-10 MED ORDER — FENTANYL CITRATE (PF) 100 MCG/2ML IJ SOLN
INTRAMUSCULAR | Status: AC
  Filled 2024-09-10: qty 2

## 2024-09-10 MED ORDER — PANTOPRAZOLE SODIUM 40 MG IV SOLR
40.0000 mg | INTRAVENOUS | Status: AC
  Administered 2024-09-10: 40 mg via INTRAVENOUS
  Filled 2024-09-10: qty 10

## 2024-09-10 MED ORDER — HEPARIN SODIUM (PORCINE) 1000 UNIT/ML IJ SOLN
3.0000 mL | Freq: Once | INTRAMUSCULAR | Status: AC
  Administered 2024-09-10: 3000 [IU] via INTRA_ARTERIAL

## 2024-09-10 MED ORDER — VERAPAMIL HCL 2.5 MG/ML IV SOLN
2.5000 mg | Freq: Once | INTRAVENOUS | Status: AC
  Administered 2024-09-10: 2.5 mg via INTRA_ARTERIAL

## 2024-09-10 MED ORDER — SODIUM CHLORIDE 0.9 % IV SOLN: INTRAVENOUS | Status: DC

## 2024-09-10 MED ORDER — DEXAMETHASONE SOD PHOSPHATE PF 10 MG/ML IJ SOLN
8.0000 mg | INTRAMUSCULAR | Status: AC
  Administered 2024-09-10: 8 mg via INTRAVENOUS
  Filled 2024-09-10: qty 0.8

## 2024-09-10 MED ORDER — MIDAZOLAM HCL 2 MG/2ML IJ SOLN
INTRAMUSCULAR | Status: AC
  Filled 2024-09-10: qty 2

## 2024-09-10 MED ORDER — ACETAMINOPHEN 500 MG PO TABS
ORAL_TABLET | ORAL | Status: AC
  Administered 2024-09-10: 1000 mg via ORAL
  Filled 2024-09-10: qty 2

## 2024-09-10 MED ORDER — MIDAZOLAM HCL (PF) 2 MG/2ML IJ SOLN
INTRAMUSCULAR | Status: AC | PRN
  Administered 2024-09-10 (×2): .5 mg via INTRAVENOUS
  Administered 2024-09-10: 1 mg via INTRAVENOUS

## 2024-09-10 MED ORDER — FENTANYL CITRATE (PF) 100 MCG/2ML IJ SOLN
INTRAMUSCULAR | Status: AC | PRN
Start: 2024-09-10 — End: 2024-09-10
  Administered 2024-09-10 (×2): 25 ug via INTRAVENOUS
  Administered 2024-09-10: 50 ug via INTRAVENOUS

## 2024-09-10 MED ORDER — VERAPAMIL HCL 2.5 MG/ML IV SOLN: Freq: Once | INTRAVENOUS | Status: DC

## 2024-09-10 MED ORDER — SODIUM CHLORIDE 0.9 % IV SOLN
8.0000 mg | INTRAVENOUS | Status: AC
  Administered 2024-09-10: 8 mg via INTRAVENOUS
  Filled 2024-09-10: qty 4

## 2024-09-10 NOTE — Progress Notes (Signed)
 Patient clinically stable post IR Y-90 liver ablation per Dr Karalee, tolerated well. Received Versed  2 mg along with Fentanyl  100 mcg IV for procedure. TR band in place left radial with 10 cc air, no bleeding nor hematoma visible, report given to Ruthanna Kitty RN post procedure/specials/19

## 2024-09-10 NOTE — Written Directive (Signed)
 MOLECULAR IMAGING AND THERAPEUTICS WRITTEN DIRECTIVE   PATIENT NAME: Ann Daugherty  PT DOB:   03/16/1971                                              MRN: [id number]  ---------------------------------------------------------------------------------------------------------------------  YTTRIUM-90 SIR-Spheres THERAPY   RADIOPHARMACEUTICAL: Yttrium-90 Resin Microspheres (SIR-Spheres)   PRESCRIBED DOSE FOR ADMINISTRATION: 13.5 mCi   ROUTE OFADMINISTRATION: via established catheter line placed in hepatic artery   DIAGNOSIS:  Liver Mass   PREVIOUS TREATMENT (DATE AND DOSE):   DATE OF PRE Y-90 SCAN: 08/13/2024   LUNG SHUNT %:  2%   TARGET REGION OF LIVER:   Right []   Left  [x]     ADDITIONAL PHYSICIAN COMMENTS/NOTES   AUTHORIZED USER SIGNATURE & TIME STAMP:

## 2024-09-10 NOTE — Procedures (Signed)
 Interventional Radiology Procedure Note  Procedure: Transarterial radioembolization - segment 4a segmentectomy with Y90  Complications: None  Estimated Blood Loss: None  Recommendations: - DC home    Signed,  Wilkie LOIS Lent, MD

## 2024-09-16 ENCOUNTER — Other Ambulatory Visit: Payer: Self-pay

## 2024-09-17 DIAGNOSIS — Z419 Encounter for procedure for purposes other than remedying health state, unspecified: Secondary | ICD-10-CM | POA: Diagnosis not present

## 2024-09-19 ENCOUNTER — Other Ambulatory Visit (HOSPITAL_COMMUNITY): Payer: Self-pay

## 2024-09-20 ENCOUNTER — Other Ambulatory Visit (HOSPITAL_COMMUNITY): Payer: Self-pay

## 2024-09-22 ENCOUNTER — Other Ambulatory Visit: Payer: Self-pay

## 2024-09-26 ENCOUNTER — Other Ambulatory Visit: Payer: Self-pay

## 2024-09-26 NOTE — Progress Notes (Signed)
 Specialty Pharmacy Ongoing Clinical Assessment Note  Ann Daugherty is a 53 y.o. female who is being followed by the specialty pharmacy service for RxSp Oncology   Patient's specialty medication(s) reviewed today: Abemaciclib  (VERZENIO )   Missed doses in the last 4 weeks: 0   Patient/Caregiver did not have any additional questions or concerns.   Therapeutic benefit summary: Patient is achieving benefit   Adverse events/side effects summary: Experienced adverse events/side effects (fatigue and diarrhea, usually subsides after second week of treatment. Uses imodium to control diarrhea)   Patient's therapy is appropriate to: Continue    Goals Addressed             This Visit's Progress    Maintain optimal adherence to therapy   On track    Patient is on track. Patient will maintain adherence         Follow up: 3 months  [facility] Specialty Pharmacist

## 2024-09-30 ENCOUNTER — Ambulatory Visit
Admission: RE | Admit: 2024-09-30 | Discharge: 2024-09-30 | Disposition: A | Discharge status: Home / Self Care | Source: Ambulatory Visit | Attending: Radiology | Admitting: Radiology

## 2024-09-30 DIAGNOSIS — K769 Liver disease, unspecified: Secondary | ICD-10-CM

## 2024-09-30 HISTORY — PX: IR RADIOLOGIST EVAL & MGMT: IMG5224

## 2024-09-30 NOTE — Progress Notes (Signed)
 This encounter was conducted via the [employer] providing interactive audio and visual communication.  The patient provided verbal consent to conduct a virtual appointment.  The patient was located at their primary residence during this encounter.   Chief Complaint: Patient was seen in consultation today for  at the request of Omohundro,Jennifer C  Referring Physician(s): Omohundro,Jennifer C  History of Present Illness: Ann Daugherty is a 53 y.o. female with a history of left breast cancer stage IV -  (cT4b, cN0, cM1, G3, ER+, PR+, HER2-).  She has lesions in her liver and underwent biopsy of one of the liver lesions in October 2024 confirming metastatic carcinoma from breast primary.   She has recently completed neoadjuvant chemotherapy with Adriamycin  and Cytoxan  followed by Taxol  completed in February 2025.   She is now a candidate for potential liver directed therapy.  The dominant lesion in segment 6 has regressed from 3.6 cm to 1.4 cm and is an excellent candidate for percutaneous microwave ablation.   There is a smaller subcentimeter lesion in the anterior dome which is questionable and warrants close observation.   She had a technically successful percutaneous thermal ablation of the segment 6 lesion on 01/16/2024.  The procedure went well and without complication and she was discharged home the same day.   MRI 04/12/24 - Nonenhancing area in the right hepatic lobe, segment 6 measuring 1.0 x 2.0 cm. There is no abnormal enhancement. Findings favor treated metastatic lesion. - There is a stable approximately 6 x 6 mm enhancing lesion in the subcapsular left hepatic dome, segment 4A, which appears grossly similar to the prior study. No new suspicious liver lesions seen.   MRI 07/25/24 1. There is a 1.0 x 1.4 cm subcapsular lesion in the left hepatic lobe, segment 4A, which has increased in size since the prior study and is concerning for a solitary liver metastasis. 2.  Redemonstration of peripheral wedge-shaped nonenhancing area in the right hepatic lobe, segment 6, which is favored to represent involuting treated lesion. No local recurrence noted. 3. No other metastatic disease identified within the abdomen.   After discussion, we elected to proceed with Y90 segmentectomy of this probable metastasis.  This was completed on 09/10/24.  We spoke to today over video conference for her 2 week follow-up. She is doing very well and feels fully recovered.  She had some mild nausea post treatment but that was self limited.  She has resumed full activities including exercise. No active issues at this time.   Past Medical History:  Diagnosis Date   ADD (attention deficit disorder)    Anemia    Breast cancer metastasized to liver, unspecified laterality (HCC) 08/2023   01-16-2024 s/p microwave ablation large liver lesion w/ residual small lesion   Depression    GAD (generalized anxiety disorder)    GERD (gastroesophageal reflux disease)    History of antineoplastic chemotherapy 08/2023   08-15-2023  to  20-20-2025   History of external beam radiation therapy 01/24/2024   left chest wall   s/p bilateral mastectomy   01-24-2024  to  04-16-2024   Malignant neoplasm of upper-inner quadrant of left breast in female, estrogen receptor positive (HCC) 07/2023   oncologist-- dr catrina [facility]--- dr forsyth/ surgeon -- dr aron;  dx 07/2023  HGDCIS/ ICDG2; liver bx confirmed mets ; Stage IV;  completed chemo 02/ 2025; 03/ 2025 s/p bilateral mastectomy; completed IMRT 04-16-2024; scheduled for Hystectomy w/ BSO 06-03-2024   Pap smear abnormality of  cervix with LGSIL 05/20/2024   w/ + HPV   PONV (postoperative nausea and vomiting)    Port-A-Cath in place 08/14/2023    Past Surgical History:  Procedure Laterality Date   BREAST BIOPSY Left 07/19/2023   US  LT BREAST BX W LOC DEV EA ADD LESION IMG BX SPEC US  GUIDE 07/19/2023 [facility]   BREAST BIOPSY Left  07/19/2023   US  LT BREAST BX W LOC DEV 1ST LESION IMG BX SPEC US  GUIDE 07/19/2023 [facility]   BREAST BIOPSY Right 08/09/2023   US  RT BREAST BX W LOC DEV 1ST LESION IMG BX SPEC US  GUIDE 08/09/2023 [facility]   BREAST BIOPSY Right 08/09/2023   US  RT BREAST BX W LOC DEV EA ADD LESION IMG BX SPEC US  GUIDE 08/09/2023 [facility]   CYSTOSCOPY N/A 06/03/2024   Procedure: CYSTOSCOPY;  Surgeon: Erik Kieth BROCKS, MD;  Location: [facility];  Service: Gynecology;  Laterality: N/A;   DILATION AND CURETTAGE OF UTERUS  1990   IR ANGIOGRAM VISCERAL SELECTIVE  08/13/2024   IR EMBO TUMOR ORGAN ISCHEMIA INFARCT INC GUIDE ROADMAPPING  09/10/2024   IR GUIDED LIVER TUMOR ABLATION RFA PERC     IR RADIOLOGIST EVAL & MGMT  01/08/2024   IR RADIOLOGIST EVAL & MGMT  01/31/2024   IR RADIOLOGIST EVAL & MGMT  04/17/2024   IR RADIOLOGIST EVAL & MGMT  07/29/2024   IR RADIOLOGIST EVAL & MGMT  09/30/2024   LAMINECTOMY AND MICRODISCECTOMY LUMBAR SPINE  2011   by dr onetha;   re-do L4--5   LUMBAR LAMINECTOMY/DECOMPRESSION WITH DISCECTOMY  09/17/2006   @[facility]  by dr onetha;   L4-5   PORTACATH PLACEMENT Right 08/14/2023   Procedure: INSERTION PORT-A-CATH WITH ULTRASOUND ANDREA;  Surgeon: Aron Shoulders, MD;  Location: [facility] OR;  Service: General;  Laterality: Right;   SIMPLE MASTECTOMY WITH AXILLARY SENTINEL NODE BIOPSY Bilateral 01/24/2024   Procedure: BILATERAL MASTECTOMY;  Surgeon: Aron Shoulders, MD;  Location: [facility];  Service: General;  Laterality: Bilateral;  PEC BLOCK   TOTAL LAPAROSCOPIC HYSTERECTOMY WITH BILATERAL SALPINGO OOPHORECTOMY Bilateral 06/03/2024   Procedure: HYSTERECTOMY, TOTAL, LAPAROSCOPIC, WITH BILATERAL SALPINGO-OOPHORECTOMY;  Surgeon: Erik Kieth BROCKS, MD;  Location: [facility] OR;  Service: Gynecology;  Laterality: Bilateral;  TLH/BSO/cysto    Allergies: Patient has no known allergies.  Medications: Prior to Admission medications   Medication Sig Start Date End Date Taking?  Authorizing Provider  abemaciclib  (VERZENIO ) 100 MG tablet Take 1 tablet (100 mg total) by mouth 2 (two) times daily. 06/19/24   Gudena, Vinay, MD  acetaminophen  (TYLENOL ) 500 MG tablet Take 2 tablets (1,000 mg total) by mouth every 6 (six) hours. 06/03/24   Erik Kieth BROCKS, MD  anastrozole  (ARIMIDEX ) 1 MG tablet Take 1 tablet (1 mg total) by mouth daily. 06/19/24   Crawford Morna Pickle, NP  calcium carbonate (TUMS - DOSED IN MG ELEMENTAL CALCIUM) 500 MG chewable tablet Chew 1 tablet by mouth daily as needed (low calcium).    [provider]  Cholecalciferol (VITAMIN D3) 5000 units CAPS Take 5,000 Units by mouth daily. 12/07/16   [provider]  citalopram  (CELEXA ) 40 MG tablet Take 1 tablet (40 mg total) by mouth daily. 04/22/24   Causey, Morna Pickle, NP  Cranberry 500 MG TABS Take 1 tablet by mouth 3 (three) times a week.    [provider]  Cyanocobalamin (VITAMIN B-12) 1000 MCG SUBL Place 1,000 mcg under the tongue daily.    [provider]  LORazepam  (ATIVAN ) 0.5 MG  tablet Take 1 tablet (0.5 mg total) by mouth 2 (two) times daily as needed for anxiety. 08/27/24   Gudena, Vinay, MD  pyridOXINE (VITAMIN B6) 100 MG tablet Take 100 mg by mouth daily. 08/25/24   [provider]     Family History  Problem Relation Age of Onset   Breast cancer Mother 62   Colon cancer Mother 86   Asthma Mother    Hypertension Mother    Hearing loss Mother    Hypertension Father    Diabetes Father    Hyperlipidemia Father    Learning disabilities Son    ADD / ADHD Son    Ovarian cancer Maternal Grandmother    Hypertension Maternal Grandfather    Hyperlipidemia Maternal Grandfather    Heart disease Maternal Grandfather    Stroke Maternal Grandfather    Mental illness Paternal Grandmother    Hypertension Paternal Grandfather     Social History   Socioeconomic History   Marital status: Divorced    Spouse name: Not on file   Number of children: 2    Years of education: Not on file   Highest education level: Not on file  Occupational History   Not on file  Tobacco Use   Smoking status: Former    Current packs/day: 0.00    Average packs/day: 0.5 packs/day for 30.0 years (15.0 ttl pk-yrs)    Types: Cigarettes    Start date: 04/07/1991    Quit date: 04/06/2021    Years since quitting: 3.4   Smokeless tobacco: Never  Vaping Use   Vaping status: Every Day   Substances: Nicotine   Devices: jewel  (pt stated started in 2022)  Substance and Sexual Activity   Alcohol use: Not Currently   Drug use: Not Currently    Comment: 05-28-2024  CBD gummies , last time 2022   Sexual activity: Not Currently    Birth control/protection: None  Other Topics Concern   Not on file  Social History Narrative   2  sons: 9 y.o. and 22 y.o. : 88 y.o. son lives with pt. 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   Social Drivers of Health   Financial Resource Strain: Not on file  Food Insecurity: Food Insecurity Present (02/19/2024)   Hunger Vital Sign    Worried About Running Out of Food in the Last Year: Sometimes true    Ran Out of Food in the Last Year: Sometimes true  Transportation Needs: No Transportation Needs (02/19/2024)   PRAPARE - Administrator, Civil Service (Medical): No    Lack of Transportation (Non-Medical): No  Physical Activity: Not on file  Stress: Not on file  Social Connections: Not on file    ECOG Status: 1 - Symptomatic but completely ambulatory  Review of Systems: A 12 point ROS discussed and pertinent positives are indicated in the HPI above.  All other systems are negative.  Review of Systems  Vital Signs: There were no vitals taken for this visit.    Physical Exam Constitutional:      General: She is not in acute distress.    Appearance: Normal appearance. She is normal weight.  HENT:     Head: Normocephalic and atraumatic.  Eyes:     General: No scleral icterus. Pulmonary:     Effort:  Pulmonary effort is normal.  Neurological:     Mental Status: She is alert and oriented to person, place, and time.  Psychiatric:  Mood and Affect: Mood normal.        Behavior: Behavior normal.       Imaging: IR Radiologist Eval & Mgmt Result Date: 09/30/2024 EXAM: ESTABLISHED PATIENT OFFICE VISIT CHIEF COMPLAINT: SEE EPIC NOTE HISTORY OF PRESENT ILLNESS: SEE EPIC NOTE REVIEW OF SYSTEMS: SEE EPIC NOTE PHYSICAL EXAMINATION: SEE EPIC NOTE ASSESSMENT AND PLAN: SEE EPIC NOTE Electronically Signed   By: Wilkie Lent M.D.   On: 09/30/2024 08:07   NM Y90 LIVER SPECT/CT THERAPY Result Date: 09/10/2024 CLINICAL DATA:  53 year old female with breast cancer metastatic to the liver. She presents for segment 4 A trans arterial radioembolization segmentectomy. EXAM: NUCLEAR MEDICINE SPECIAL MED RAD PHYSICS CONS; NUCLEAR MEDICINE RADIO PHARM THERAPY INTRA ARTERIAL; NUCLEAR MEDICINE TREATMENT PROCEDURE; NUCLEAR MEDICINE LIVER SCAN TECHNIQUE: In conjunction with the interventional radiologist a Y- Microsphere dose was calculated utilizing body surface area formulation. Calculated dose equal 13.5 mCi. Pre therapy MAA liver SPECT scan and CTA were evaluated. Utilizing a microcatheter system, the hepatic artery was selected and Y-90 microspheres were delivered in fractionated aliquots. Radiopharmaceutical was delivered by the interventional radiologist. The patient tolerated procedure well. No adverse effects were noted. Bremsstrahlung planar and SPECT imaging of the abdomen following intrahepatic arterial delivery of Y-90 microsphere was performed. RADIOPHARMACEUTICALS:  13.5 millicurie Y 90 COMPARISON:  None Available. FINDINGS: Y - 90 microspheres therapy as above. First therapy the right hepatic lobe. Bremsstrahlung planar and SPECT imaging of the abdomen following intrahepatic arterial delivery of Y-90 microsphere demonstrates radioactivity localized to segment 4a of the left hepatic lobe. No evidence  of extrahepatic activity. IMPRESSION: Successful Y - 90 microsphere delivery for treatment of unresectable liver metastasis. Segment 4A segmentectomy. Bremsstrahlung scan demonstrates activity localized to segment 4 A with some activity in segment 4 B hepatic lobe with no extrahepatic activity identified. Electronically Signed   By: Wilkie Lent M.D.   On: 09/10/2024 12:29   IR EMBO TUMOR ORGAN ISCHEMIA INFARCT INC GUIDE ROADMAPPING Result Date: 09/10/2024 INDICATION: 53 year old female with breast cancer metastatic to the liver. EXAM: IR EMBO TUMOR ORGAN ISCHEMIA INFARCT INC GUIDE ROADMAPPING MEDICATIONS: 2 g cefoxitin . The antibiotic was administered within 1 hour of the procedure ANESTHESIA/SEDATION: Moderate (conscious) sedation was employed during this procedure. A total of Versed  2 mg and Fentanyl  100 mcg was administered intravenously by the radiology nurse. Moderate Sedation Time: 64 minutes. The patient's level of consciousness and vital signs were monitored continuously by radiology nursing throughout the procedure under my direct supervision. CONTRAST:  80mL OMNIPAQUE  IOHEXOL  300 MG/ML  SOLN FLUOROSCOPY: Radiation Exposure Index (as provided by the fluoroscopic device): 406 mGy Kerma COMPLICATIONS: None immediate. PROCEDURE: Informed consent was obtained from the patient following explanation of the procedure, risks, benefits and alternatives. The patient understands, agrees and consents for the procedure. All questions were addressed. A time out was performed prior to the initiation of the procedure. Maximal barrier sterile technique utilized including caps, mask, sterile gowns, sterile gloves, large sterile drape, hand hygiene, and Betadine  prep. The left radial artery was interrogated with ultrasound and found to be widely patent and of adequate diameter. An image was obtained and stored for the medical record. Local anesthesia was attained by infiltration with 1% lidocaine . Under real-time  sonographic guidance, the vessel was punctured with a 21 gauge micropuncture needle. Using standard technique, the initial micro needle was exchanged over a 0.021 micro wire for a hydrophilic 5 French slim arterial sheath. The sheath was flushed and then a radial cocktail consisting of 5000 units  heparin , 2.5 mg Verapamil  and 200 mcg nitroglycerine was injected through the sheath. A Terumo RAVI MG1 125 cm hydrophilic catheter was advanced over a Bentson wire up the arm, down the descending aorta and into the abdominal aorta. The catheter was used to select the celiac artery and a celiac arteriogram was performed. A Glidewire was further advanced into the common hepatic artery and the catheter advanced over the wire. Digital subtraction angiography was then performed. The anatomy of the gastroduodenal, left and right hepatic arteries was identified. No evidence of abnormal anatomy. A Cook cantata 2.5 French microcatheter was advanced through the left hepatic artery and into the segment 4 hepatic artery. Contrast arteriography was performed. The microcatheter was further advanced into the segment 4 a branch. Contrast was again injected confirming the catheter location. Trans arterial radio embolization was then performed. The prescribed dose of Y 90 microspheres was carefully administered in aliquots according to our standard procedure. Antegrade flow was confirmed within the target vessel throughout the procedure. At the end of the procedure, after the dose was completely administered, the catheter system was removed and appropriately disposed of. Hemostasis was attained with the assistance of a TR band. IMPRESSION: Successful segment 4A trans arterial radioembolization segmentectomy. Electronically Signed   By: Wilkie Lent M.D.   On: 09/10/2024 12:25    Labs:  CBC: Recent Labs    08/06/24 0827 08/13/24 0752 08/27/24 0908 09/09/24 0841  WBC 1.9* 2.6* 3.2* 2.6*  HGB 12.6 11.4* 11.9* 11.7*  HCT  35.6* 33.4* 34.2* 33.5*  PLT 238 241 279 203    COAGS: Recent Labs    01/16/24 0819 08/13/24 0752 09/10/24 0814  INR 1.0 1.0 1.3*    BMP: Recent Labs    08/06/24 0827 08/13/24 0752 08/27/24 0908 09/09/24 0841  NA 139 140 139 139  K 4.1 3.5 4.2 3.8  CL 105 107 106 105  CO2 29 24 30 28   GLUCOSE 95 99 99 103*  BUN 9 15 13 12   CALCIUM 9.3 8.7* 9.5 9.2  CREATININE 0.93 0.58 0.82 0.78  GFRNONAA >60 >60 >60 >60    LIVER FUNCTION TESTS: Recent Labs    08/06/24 0827 08/13/24 0752 08/27/24 0908 09/09/24 0841  BILITOT 0.4 0.4 0.2 0.3  AST 18 20 18 21   ALT 14 15 15 15   ALKPHOS 88 75 92 89  PROT 6.8 6.5 6.4* 6.7  ALBUMIN  4.2 3.7 3.8 4.0    TUMOR MARKERS: No results for input(s): AFPTM, CEA, CA199, CHROMGRNA in the last 8760 hours.  Assessment and Plan:  Extremely pleasant 53 year old female with breast cancer metastatic to the liver.  She is now 3 weeks post Y90 segmentectomy of a lesion in segment 4a.  Recovery has been uneventful and she is doing well and back to full activities.    1.) Liver MRI with gad in 3 months (early February) as well as clinic visit.  Please target 1st week of Feb.   Electronically Signed: Wilkie POUR Brennley Curtice 09/30/2024, 8:36 AM   This encounter was conducted via the [employer] providing interactive audio and visual communication.  The patient provided verbal consent to conduct a virtual appointment.  The patient was located at their primary residence during this encounter.  I spent a total of  15 Minutes in face to face in clinical consultation, greater than 50% of which was counseling/coordinating care for breast cancer metastatic to the liver.

## 2024-10-10 ENCOUNTER — Other Ambulatory Visit (HOSPITAL_COMMUNITY): Payer: Self-pay

## 2024-10-14 ENCOUNTER — Other Ambulatory Visit (HOSPITAL_COMMUNITY): Payer: Self-pay

## 2024-10-16 ENCOUNTER — Other Ambulatory Visit: Payer: Self-pay

## 2024-10-17 ENCOUNTER — Telehealth: Payer: Self-pay

## 2024-10-17 ENCOUNTER — Other Ambulatory Visit: Payer: Self-pay

## 2024-10-17 ENCOUNTER — Other Ambulatory Visit (HOSPITAL_COMMUNITY): Payer: Self-pay

## 2024-10-17 NOTE — Telephone Encounter (Signed)
 Pt called about a rash that she noticed on trunk, back, abdomen and arms on 12/11. Advise pt to take benadryl , Claritin and a over the counter cream. Change soap to antibacterial cream. Call back and speak with nurse in 5 days if it doesn't resolve.

## 2024-10-17 NOTE — Progress Notes (Signed)
 Clinical Intervention Note  Clinical Intervention Notes: Patient states that she has had to stop and restart Verzenio  mulitple times due to procedures etc. She states that she has had a rash on trunk and arm since yesterday's morning dose. She has taken Benadryl  which has addressed the itch but the rash remains. She denies any other symptoms such as fever and rash is not bumpy, raw, or open wound in any area and has not spread. Advised patient she may switch to a less drowsy antihistamine such as Claritin and monitor for changes and transferred her to nurses line at cancer center at her request in case they had further guidance.   Clinical Intervention Outcomes: Prevention of an adverse drug event   Delon CHRISTELLA Brow Specialty Pharmacist

## 2024-10-17 NOTE — Progress Notes (Signed)
 Specialty Pharmacy Refill Coordination Note  Spoke with Ann Daugherty  Ann Daugherty is a 53 y.o. female contacted today regarding refills of specialty medication(s) Abemaciclib  (VERZENIO )  Doses on hand: 2   Patient requested: Pickup at [facility] at [place] date: 10/17/24  Medication will be filled on 10/17/24

## 2024-10-21 ENCOUNTER — Other Ambulatory Visit (HOSPITAL_COMMUNITY): Payer: Self-pay

## 2024-10-22 ENCOUNTER — Inpatient Hospital Stay: Attending: Adult Health

## 2024-10-22 ENCOUNTER — Inpatient Hospital Stay

## 2024-10-22 ENCOUNTER — Ambulatory Visit (HOSPITAL_COMMUNITY): Admission: RE | Admit: 2024-10-22 | Discharge: 2024-10-22 | Attending: Hematology and Oncology

## 2024-10-22 DIAGNOSIS — Z170 Estrogen receptor positive status [ER+]: Secondary | ICD-10-CM | POA: Diagnosis present

## 2024-10-22 DIAGNOSIS — C50919 Malignant neoplasm of unspecified site of unspecified female breast: Secondary | ICD-10-CM | POA: Diagnosis not present

## 2024-10-22 DIAGNOSIS — N3289 Other specified disorders of bladder: Secondary | ICD-10-CM | POA: Diagnosis not present

## 2024-10-22 DIAGNOSIS — C50212 Malignant neoplasm of upper-inner quadrant of left female breast: Secondary | ICD-10-CM | POA: Diagnosis present

## 2024-10-22 DIAGNOSIS — C787 Secondary malignant neoplasm of liver and intrahepatic bile duct: Secondary | ICD-10-CM | POA: Diagnosis not present

## 2024-10-22 DIAGNOSIS — J439 Emphysema, unspecified: Secondary | ICD-10-CM | POA: Diagnosis not present

## 2024-10-22 DIAGNOSIS — K760 Fatty (change of) liver, not elsewhere classified: Secondary | ICD-10-CM | POA: Diagnosis not present

## 2024-10-22 DIAGNOSIS — Z79811 Long term (current) use of aromatase inhibitors: Secondary | ICD-10-CM | POA: Insufficient documentation

## 2024-10-22 LAB — CBC WITH DIFFERENTIAL (CANCER CENTER ONLY)
Abs Immature Granulocytes: 0 K/uL (ref 0.00–0.07)
Basophils Absolute: 0 K/uL (ref 0.0–0.1)
Basophils Relative: 2 %
Eosinophils Absolute: 0.1 K/uL (ref 0.0–0.5)
Eosinophils Relative: 3 %
HCT: 35.7 % — ABNORMAL LOW (ref 36.0–46.0)
Hemoglobin: 12.3 g/dL (ref 12.0–15.0)
Immature Granulocytes: 0 %
Lymphocytes Relative: 22 %
Lymphs Abs: 0.4 K/uL — ABNORMAL LOW (ref 0.7–4.0)
MCH: 32.4 pg (ref 26.0–34.0)
MCHC: 34.5 g/dL (ref 30.0–36.0)
MCV: 93.9 fL (ref 80.0–100.0)
Monocytes Absolute: 0.2 K/uL (ref 0.1–1.0)
Monocytes Relative: 9 %
Neutro Abs: 1.1 K/uL — ABNORMAL LOW (ref 1.7–7.7)
Neutrophils Relative %: 64 %
Platelet Count: 210 K/uL (ref 150–400)
RBC: 3.8 MIL/uL — ABNORMAL LOW (ref 3.87–5.11)
RDW: 12.1 % (ref 11.5–15.5)
WBC Count: 1.8 K/uL — ABNORMAL LOW (ref 4.0–10.5)
nRBC: 0 % (ref 0.0–0.2)

## 2024-10-22 LAB — CMP (CANCER CENTER ONLY)
ALT: 17 U/L (ref 0–44)
AST: 23 U/L (ref 15–41)
Albumin: 4.3 g/dL (ref 3.5–5.0)
Alkaline Phosphatase: 119 U/L (ref 38–126)
Anion gap: 11 (ref 5–15)
BUN: 9 mg/dL (ref 6–20)
CO2: 26 mmol/L (ref 22–32)
Calcium: 9.5 mg/dL (ref 8.9–10.3)
Chloride: 105 mmol/L (ref 98–111)
Creatinine: 0.92 mg/dL (ref 0.44–1.00)
GFR, Estimated: >60 mL/min (ref >60)
Glucose, Bld: 105 mg/dL — ABNORMAL HIGH (ref 70–99)
Potassium: 3.7 mmol/L (ref 3.5–5.1)
Sodium: 141 mmol/L (ref 135–145)
Total Bilirubin: 0.5 mg/dL (ref 0.0–1.2)
Total Protein: 7.1 g/dL (ref 6.5–8.1)

## 2024-10-22 MED ORDER — HEPARIN SOD (PORK) LOCK FLUSH 100 UNIT/ML IV SOLN
500.0000 [IU] | Freq: Once | INTRAVENOUS | Status: AC
  Administered 2024-10-22: 09:00:00 500 [IU] via INTRAVENOUS

## 2024-10-22 MED ORDER — IOHEXOL 300 MG/ML  SOLN
100.0000 mL | Freq: Once | INTRAMUSCULAR | Status: AC | PRN
  Administered 2024-10-22: 09:00:00 100 mL via INTRAVENOUS

## 2024-10-22 MED ORDER — HEPARIN SOD (PORK) LOCK FLUSH 100 UNIT/ML IV SOLN
INTRAVENOUS | Status: AC
  Filled 2024-10-22: qty 5

## 2024-10-22 NOTE — Therapy (Signed)
 OUTPATIENT PHYSICAL THERAPY ONCOLOGY EVALUATION  Patient Name: Ann Daugherty MRN: [id number] DOB:10/01/1971, 53 y.o., female Today's Date: 10/23/2024  END OF SESSION:  PT End of Session - 10/23/24 1150     Visit Number 1    Number of Visits 12    Date for Recertification  12/04/24    PT Start Time 1000    PT Stop Time 1050    PT Time Calculation (min) 50 min    Activity Tolerance Patient tolerated treatment well    Behavior During Therapy [facility] for tasks assessed/performed          Past Medical History:  Diagnosis Date   ADD (attention deficit disorder)    Anemia    Breast cancer metastasized to liver, unspecified laterality (HCC) 08/2023   01-16-2024 s/p microwave ablation large liver lesion w/ residual small lesion   Depression    GAD (generalized anxiety disorder)    GERD (gastroesophageal reflux disease)    History of antineoplastic chemotherapy 08/2023   08-15-2023  to  20-20-2025   History of external beam radiation therapy 01/24/2024   left chest wall   s/p bilateral mastectomy   01-24-2024  to  04-16-2024   Malignant neoplasm of upper-inner quadrant of left breast in female, estrogen receptor positive (HCC) 07/2023   oncologist-- dr catrina gyn oncology--- dr forsyth/ surgeon -- dr aron;  dx 07/2023  HGDCIS/ ICDG2; liver bx confirmed mets ; Stage IV;  completed chemo 02/ 2025; 03/ 2025 s/p bilateral mastectomy; completed IMRT 04-16-2024; scheduled for Hystectomy w/ BSO 06-03-2024   Pap smear abnormality of cervix with LGSIL 05/20/2024   w/ + HPV   PONV (postoperative nausea and vomiting)    Port-A-Cath in place 08/14/2023   Past Surgical History:  Procedure Laterality Date   BREAST BIOPSY Left 07/19/2023   US  LT BREAST BX W LOC DEV EA ADD LESION IMG BX SPEC US  GUIDE 07/19/2023 [facility]   BREAST BIOPSY Left 07/19/2023   US  LT BREAST BX W LOC DEV 1ST LESION IMG BX SPEC US  GUIDE 07/19/2023 [facility]   BREAST BIOPSY Right 08/09/2023   US  RT  BREAST BX W LOC DEV 1ST LESION IMG BX SPEC US  GUIDE 08/09/2023 [facility]   BREAST BIOPSY Right 08/09/2023   US  RT BREAST BX W LOC DEV EA ADD LESION IMG BX SPEC US  GUIDE 08/09/2023 [facility]   CYSTOSCOPY N/A 06/03/2024   Procedure: CYSTOSCOPY;  Surgeon: Erik Kieth BROCKS, MD;  Location: [facility] OR;  Service: Gynecology;  Laterality: N/A;   DILATION AND CURETTAGE OF UTERUS  1990   IR ANGIOGRAM VISCERAL SELECTIVE  08/13/2024   IR EMBO TUMOR ORGAN ISCHEMIA INFARCT INC GUIDE ROADMAPPING  09/10/2024   IR GUIDED LIVER TUMOR ABLATION RFA PERC     IR RADIOLOGIST EVAL & MGMT  01/08/2024   IR RADIOLOGIST EVAL & MGMT  01/31/2024   IR RADIOLOGIST EVAL & MGMT  04/17/2024   IR RADIOLOGIST EVAL & MGMT  07/29/2024   IR RADIOLOGIST EVAL & MGMT  09/30/2024   LAMINECTOMY AND MICRODISCECTOMY LUMBAR SPINE  2011   by dr onetha;   re-do L4--5   LUMBAR LAMINECTOMY/DECOMPRESSION WITH DISCECTOMY  09/17/2006   @[facility]  by dr onetha;   L4-5   PORTACATH PLACEMENT Right 08/14/2023   Procedure: INSERTION PORT-A-CATH WITH ULTRASOUND ANDREA;  Surgeon: Aron Shoulders, MD;  Location: [facility] OR;  Service: General;  Laterality: Right;   SIMPLE MASTECTOMY WITH AXILLARY SENTINEL NODE BIOPSY Bilateral 01/24/2024   Procedure: BILATERAL MASTECTOMY;  Surgeon:  Aron Shoulders, MD;  Location: [facility];  Service: General;  Laterality: Bilateral;  PEC BLOCK   TOTAL LAPAROSCOPIC HYSTERECTOMY WITH BILATERAL SALPINGO OOPHORECTOMY Bilateral 06/03/2024   Procedure: HYSTERECTOMY, TOTAL, LAPAROSCOPIC, WITH BILATERAL SALPINGO-OOPHORECTOMY;  Surgeon: Erik Kieth BROCKS, MD;  Location: [facility];  Service: Gynecology;  Laterality: Bilateral;  TLH/BSO/cysto   Patient Active Problem List   Diagnosis Date Noted   Liver lesion 08/12/2024   Estrogen receptor positive 06/03/2024   Primary cancer of upper inner quadrant of left breast (HCC) 01/24/2024   Port-A-Cath in place 08/15/2023   Malignant neoplasm of upper-inner quadrant of left  female breast (HCC) 07/20/2023   Depression, major, single episode, mild 04/05/2021   Tobacco abuse 01/30/2018   Anxiety 01/30/2018   Attention deficit disorder 01/30/2018   Vitamin D  deficiency 01/30/2018      REFERRING PROVIDER: Shoulders Aron  REFERRING DIAG: LEFT shoulder stiffness  THERAPY DIAG:  Aftercare following surgery for neoplasm  Stiffness of left shoulder, not elsewhere classified  Malignant neoplasm of central portion of left female breast, unspecified estrogen receptor status (HCC)  Localized edema  ONSET DATE: 1 month ago  Rationale for Evaluation and Treatment: Rehabilitation  SUBJECTIVE:                                                                                                                                                                                           SUBJECTIVE STATEMENT: I had really good ROM, but noticed  that my left shoulder had gotten stiff in the last few weeks. The drain sites are still very painful. I still have a little swelling on the right under the armpit. I am stage IV Breast Cancer.  She has had 2 procedures on her liver. She has not had results from recent CT  PERTINENT HISTORY:   Patient presented with new diagnosis of left breast cancer 07/2023 Core needle biopsies were performed. The 5 o'clock mass had grade 3 invasive ductal carcinoma that is ER/PR positive, her 2 negative, and Ki 67 60%. The 10:30 mass was grade 2 invasive ductal carcinoma, Er/PR +, her 2 negative and Ki 67 30%. Pt had neoadjuvant chemotherapy during which she had about 30# weight loss.  She had metastasis to liver and underwent a liver ablation 01/16/2024. Pt had bilateral mastectomy on 01/24/24 with no lymph nodes removed.  She has had radiation. She underwent a Y90 treatment for a liver MET on 09/10/2024. She is also s/p a hysterectomy and salpingo-oophorectomy win July 2024   PAIN:  Are you having pain? Yes, tight, discomfort NPRS scale: 0/10-3/10 Pain  location: left chest, axilla, pecs Pain orientation: Left  PAIN TYPE: aching and tight Pain description: intermittent  Aggravating factors: reaching,reaching to the side, pushing, pulling Relieving factors: not reaching to extremes  PRECAUTIONS: Left breast cancer;NO LN's removed  WEIGHT BEARING RESTRICTIONS: No  FALLS:  Has patient fallen in last 6 months? Yes. Number of falls 1  LIVING ENVIRONMENT: Lives with: lives with her sons when not at school Lives in: House/apartment Stairs: Yes; Internal: 12 steps; on left going up  OCCUPATION: not working  LEISURE: pickleball, gym, hiking  HAND DOMINANCE: right   PRIOR LEVEL OF FUNCTION: Independent  PATIENT GOALS: increase ROM, reduce tightness   OBJECTIVE: Note: Objective measures were completed at Evaluation unless otherwise noted.  COGNITION: Overall cognitive status: Within functional limits for tasks assessed   PALPATION: Vender left pectorals, left axilla, bilateral drain sites  OBSERVATIONS / OTHER ASSESSMENTS: Mild swelling noted Right lateral chest, incisions nicely healed B, hyperpigmentation from radiation on left  SENSATION: Light touch: Deficits    POSTURE: forward head and shoulders . Left ribs rotated forward and prominent in standing   UPPER EXTREMITY AROM/PROM:  A/PROM RIGHT   eval   Shoulder extension 68  Shoulder flexion 165  Shoulder abduction 176  Shoulder internal rotation 70  Shoulder external rotation 105    (Blank rows = not tested)  A/PROM LEFT   eval  Shoulder extension 42  Shoulder flexion 155  Shoulder abduction 108  Shoulder internal rotation 68  Shoulder external rotation 88    (Blank rows = not tested)  CERVICAL AROM: All within normal limits:     UPPER EXTREMITY STRENGTH: WFL   LYMPHEDEMA ASSESSMENTS:   SURGERY TYPE/DATE: bilateral mastectomy on 01/24/24  NUMBER OF LYMPH NODES REMOVED: 0  CHEMOTHERAPY: Yes, Neoadjuvant  RADIATION:YES  HORMONE TREATMENT:  YES    QUICK DASH SURVEY: 23%                                                                                                                             TREATMENT DATE:  10/23/2024 Educated in HEP to improve left shoulder MNF:Dlepwz Shoulder Flexion with Dowel ,- Single Arm Doorway Pec Stretch at 90 Degrees Abduction,Doorway Pec Stretch at 90 Degrees Abduction, Supine stargazer and shoulder stretch for abd at wall. Discussed POC,LOS, treatment interventions.  PATIENT EDUCATION:  Education details: Access Code: [email or web address] URL: [email or web address] Date: 10/23/2024 Prepared by: Grayce Sheldon  Exercises - Supine Shoulder Flexion/scaption with Dowel  - 2 x daily - 7 x weekly - 1 sets - 5 reps - 5 hold - Single Arm Doorway Pec Stretch at 90 Degrees Abduction  - 1 x daily - 7 x weekly - 1 sets - 3 reps - 20-30 hold - Doorway Pec Stretch at 90 Degrees Abduction  - 1 x daily - 7 x weekly - 1 sets - 3 reps - 20-30 hold Supine stargazer, and wall stretch for abd Person educated: Patient Education method: Explanation, Demonstration, and Handouts Education comprehension: verbalized understanding, returned demonstration, and verbal cues  required  HOME EXERCISE PROGRAM:  Supine Shoulder Flexion/scaption with Dowel  - 2 x daily - 7 x weekly - 1 sets - 5 reps - 5 hold - Single Arm Doorway Pec Stretch at 90 Degrees Abduction  - 1 x daily - 7 x weekly - 1 sets - 3 reps - 20-30 hold - Doorway Pec Stretch at 90 Degrees Abduction  - 1 x daily - 7 x weekly - 1 sets - 3 reps - 20-30 hold Supine stargazer, and wall stretch for abd  ASSESSMENT:  CLINICAL IMPRESSION: Patient is a 53 y.o. female who was seen today for physical therapy evaluation and treatment for complaints of left shoulder tightness with pain, and right lateral trunk swelling. She is s/p bilateral Mastectomies with No LN removal, but did have radiation on the left. She has stage IV breast cancer, but had recent procedures on  her liver to treat the metastasis. She has not had results from CT yet. She was educated in shoulder ROM and chest stretching to start performing at home to help improve ROM/tightness. She will benefit from skilled PT to address deficits in ROM/flexibility, swelling and return to PLOF.  OBJECTIVE IMPAIRMENTS: decreased activity tolerance, decreased knowledge of condition, decreased ROM, decreased strength, increased edema, impaired flexibility, impaired UE functional use, postural dysfunction, and pain.   ACTIVITY LIMITATIONS: reach over head and hygiene/grooming  PARTICIPATION LIMITATIONS: doing all but limited with reaching with left UE  PERSONAL FACTORS: Stage IV breast cancer s/p bilateral Mastectomies, chemo and radiation are also affecting patient's functional outcome.   REHAB POTENTIAL: Good  CLINICAL DECISION MAKING: Stable/uncomplicated  EVALUATION COMPLEXITY: Low  GOALS: Goals reviewed with patient? Yes  SHORT TERM GOALS: Target date: 12/05/2023  Pt will be independent in a HEP to improve left shoulder/chest ROM Baseline: Goal status: INITIAL  2.  Pt will have left shoulder abduction atleast 160 degrees for improved ability to perform hair care and lateral reaching Baseline:  Goal status: INITIAL  3.  Pt will have left shoulder flexion atleast 162 degrees for ability to reach into overhead cabinets Baseline:  Goal status: INITIAL  4.  Pt will have quick dash decreased to no greater than 10 percent to demonstrate improved function Baseline:  Goal status: INITIAL  5. Pt will be independent with MLD for right chest swelling Baseline:  Goal status: INITIAL  PLAN:  PT FREQUENCY: 2x/week  PT DURATION: 6 weeks  PLANNED INTERVENTIONS: [id number]- PT Re-evaluation, 97750- Physical Performance Testing, 97110-Therapeutic exercises, 97530- Therapeutic activity, [id number]- Neuromuscular re-education, 97535- Self Care, [id number]- Manual therapy, [id number]- Orthotic Initial, and [id number]-  Orthotic/Prosthetic subsequent  PLAN FOR NEXT SESSION: Review HEP, STM to UT/pecs, lat stretch,PROM, pulleys, LTR, foam roll when ready   Grayce JINNY Sheldon, PT 10/23/2024, 1:08 PM

## 2024-10-23 ENCOUNTER — Other Ambulatory Visit: Payer: Self-pay

## 2024-10-23 ENCOUNTER — Ambulatory Visit: Attending: General Surgery

## 2024-10-23 DIAGNOSIS — C50112 Malignant neoplasm of central portion of left female breast: Secondary | ICD-10-CM | POA: Diagnosis not present

## 2024-10-23 DIAGNOSIS — R600 Localized edema: Secondary | ICD-10-CM | POA: Insufficient documentation

## 2024-10-23 DIAGNOSIS — Z483 Aftercare following surgery for neoplasm: Secondary | ICD-10-CM | POA: Diagnosis not present

## 2024-10-23 DIAGNOSIS — M25612 Stiffness of left shoulder, not elsewhere classified: Secondary | ICD-10-CM | POA: Diagnosis not present

## 2024-10-23 LAB — CANCER ANTIGEN 27.29: CA 27.29: 19.3 U/mL (ref 0.0–38.6)

## 2024-10-27 ENCOUNTER — Other Ambulatory Visit: Payer: Self-pay

## 2024-10-27 ENCOUNTER — Other Ambulatory Visit: Payer: Self-pay | Admitting: Interventional Radiology

## 2024-10-27 ENCOUNTER — Inpatient Hospital Stay: Admitting: Hematology and Oncology

## 2024-10-27 VITALS — BP 112/69 | HR 86 | Temp 97.7°F | Resp 18 | Ht 66.0 in | Wt 141.0 lb

## 2024-10-27 DIAGNOSIS — R160 Hepatomegaly, not elsewhere classified: Secondary | ICD-10-CM

## 2024-10-27 DIAGNOSIS — C50212 Malignant neoplasm of upper-inner quadrant of left female breast: Secondary | ICD-10-CM

## 2024-10-27 DIAGNOSIS — Z170 Estrogen receptor positive status [ER+]: Secondary | ICD-10-CM

## 2024-10-27 DIAGNOSIS — C787 Secondary malignant neoplasm of liver and intrahepatic bile duct: Secondary | ICD-10-CM

## 2024-10-27 MED ORDER — ABEMACICLIB 50 MG PO TABS
50.0000 mg | ORAL_TABLET | Freq: Two times a day (BID) | ORAL | 3 refills | Status: AC
  Filled 2024-10-27: qty 56, 28d supply, fill #0
  Filled 2024-11-18: qty 56, 28d supply, fill #1

## 2024-10-27 NOTE — Progress Notes (Signed)
 Clinical Intervention Note  Clinical Intervention Notes: Patient reported that her dose of Verzenio  was decreased to 50 mg twice daily. Per OV from today, dose was decreased and new prescription sent in for dose decrease due to drop in white blood count.   Clinical Intervention Outcomes: Improved therapy effectiveness   [facility] Specialty Pharmacist

## 2024-10-27 NOTE — Progress Notes (Signed)
 "  Patient Care Team: [name], MD as PCP - General (Hematology and Oncology) Obgyn, Anna as Consulting Physician (Obstetrics and Gynecology) Onetha Kuba, MD as Consulting Physician (Neurosurgery) Tyree Nanetta SAILOR, RN as Oncology Nurse Navigator [name], MD as Consulting Physician (Hematology and Oncology) Aron Shoulders, MD as Consulting Physician (General Surgery) Izell Domino, MD as Attending Physician (Radiation Oncology)  DIAGNOSIS:  Encounter Diagnosis  Name Primary?   Malignant neoplasm of upper-inner quadrant of left breast in female, estrogen receptor positive (HCC) Yes    SUMMARY OF ONCOLOGIC HISTORY: Oncology History  Malignant neoplasm of upper-inner quadrant of left female breast (HCC)  07/19/2023 Initial Diagnosis   Palpable left breast mass for 6 months.  Mammogram and ultrasound revealed large masses.  10:30 position: 4.3 cm with skin thickening and ulceration: Grade 2 IDC with high-grade DCIS ER 95%, PR 95%, Ki67 30%, HER2 2+ by IHC, FISH negative ratio 1.37; 5 o'clock position: 3.3 cm with calcifications spanning 6.7 cm, axilla negative, biopsy: Grade 3 IDC with necrosis ER 95% PR 90% Ki-67 60% HER2 1+ negative   07/26/2023 Cancer Staging   Staging form: Breast, AJCC 8th Edition - Clinical: Stage IIIB (cT4b, cN0, cM0, G3, ER+, PR+, HER2-) - Signed by [name], MD on 07/26/2023 Histologic grading system: 3 grade system   08/15/2023 - 12/27/2023 Chemotherapy   Patient is on Treatment Plan : BREAST ADJUVANT DOSE DENSE AC q14d / PACLitaxel  q7d     08/17/2023 Initial Biopsy   Liver biopsy: consistent with metastatic carcinoma, breast primary.     08/21/2023 Cancer Staging   Staging form: Breast, AJCC 8th Edition - Pathologic: Stage IV (pM1) - Signed by Crawford Morna Pickle, NP on 08/21/2023     CHIEF COMPLIANT: Follow-up to discuss results of CT scans  HISTORY OF PRESENT ILLNESS: Ann Daugherty is a 53 year old with above-mentioned history of breast  cancer who is currently on Verzenio  with letrozole.  She is tolerating treatment fairly well without further diarrhea she is taking 1/4 tablet of Imodium.  She denies any fevers or chills.  Denies any abdominal pain nausea vomiting.    ALLERGIES:  has no known allergies.  MEDICATIONS:  Current Outpatient Medications  Medication Sig Dispense Refill   abemaciclib  (VERZENIO ) 100 MG tablet Take 1 tablet (100 mg total) by mouth 2 (two) times daily. 60 tablet 3   anastrozole  (ARIMIDEX ) 1 MG tablet Take 1 tablet (1 mg total) by mouth daily. 90 tablet 3   calcium carbonate (TUMS - DOSED IN MG ELEMENTAL CALCIUM) 500 MG chewable tablet Chew 1 tablet by mouth daily as needed (low calcium).     Cholecalciferol (VITAMIN D3) 5000 units CAPS Take 5,000 Units by mouth daily.     citalopram  (CELEXA ) 40 MG tablet Take 1 tablet (40 mg total) by mouth daily. 90 tablet 1   Cranberry 500 MG TABS Take 1 tablet by mouth 3 (three) times a week.     Cyanocobalamin (VITAMIN B-12) 1000 MCG SUBL Place 1,000 mcg under the tongue daily.     loperamide (IMODIUM A-D) 2 MG tablet Take 2 mg by mouth 4 (four) times daily as needed for diarrhea or loose stools.     LORazepam  (ATIVAN ) 0.5 MG tablet Take 1 tablet (0.5 mg total) by mouth 2 (two) times daily as needed for anxiety. 30 tablet 3   pyridOXINE (VITAMIN B6) 100 MG tablet Take 100 mg by mouth daily.     acetaminophen  (TYLENOL ) 500 MG tablet Take 2 tablets (1,000 mg total) by mouth  every 6 (six) hours. (Patient not taking: Reported on 10/27/2024) 60 tablet 0   No current facility-administered medications for this visit.    PHYSICAL EXAMINATION: ECOG PERFORMANCE STATUS: 1 - Symptomatic but completely ambulatory  Vitals:   10/27/24 0900  BP: 112/69  Pulse: 86  Resp: 18  Temp: 97.7 F (36.5 C)  SpO2: 98%   Filed Weights   10/27/24 0900  Weight: 141 lb (64 kg)      LABORATORY DATA:  I have reviewed the data as listed    Latest Ref Rng & Units 10/22/2024     8:01 AM 09/09/2024    8:41 AM 08/27/2024    9:08 AM  CMP  Glucose 70 - 99 mg/dL 894  896  99   BUN 6 - 20 mg/dL 9  12  13    Creatinine 0.44 - 1.00 mg/dL 9.07  9.21  9.17   Sodium 135 - 145 mmol/L 141  139  139   Potassium 3.5 - 5.1 mmol/L 3.7  3.8  4.2   Chloride 98 - 111 mmol/L 105  105  106   CO2 22 - 32 mmol/L 26  28  30    Calcium 8.9 - 10.3 mg/dL 9.5  9.2  9.5   Total Protein 6.5 - 8.1 g/dL 7.1  6.7  6.4   Total Bilirubin 0.0 - 1.2 mg/dL 0.5  0.3  0.2   Alkaline Phos 38 - 126 U/L 119  89  92   AST 15 - 41 U/L 23  21  18    ALT 0 - 44 U/L 17  15  15      Lab Results  Component Value Date   WBC 1.8 (L) 10/22/2024   HGB 12.3 10/22/2024   HCT 35.7 (L) 10/22/2024   MCV 93.9 10/22/2024   PLT 210 10/22/2024   NEUTROABS 1.1 (L) 10/22/2024    ASSESSMENT & PLAN:  Malignant neoplasm of upper-inner quadrant of left female breast (HCC) 07/19/2023:Palpable left breast mass for 6 months.  Mammogram and ultrasound revealed large masses.   10:30 position: 4.3 cm with skin thickening and ulceration: Grade 2 IDC with high-grade DCIS ER 95%, PR 95%, Ki67 30%, HER2 2+ by IHC, FISH negative ratio 1.37;  5 o'clock position: 3.3 cm with calcifications spanning 6.7 cm, axilla negative, biopsy: Grade 3 IDC with necrosis ER 95% PR 90% Ki-67 60% HER2 1+ negative   CT CAP 08/03/2023: 2 prominent left breast masses, left axillary lymph nodes, right hepatic lobe lesion concerning for metastatic breast cancer Ultrasound a liver biopsy scheduled for 08/17/2023   Treatment plan: Neoadjuvant chemotherapy with dose Adriamycin  and Cytoxan  followed by Taxol  completed 12/27/2023 Mastectomy left breast 01/24/2024 Adjuvant radiation completed 04/16/24 Liver directed therapy (underwent microwave ablation 01/16/2024) Antiestrogen therapy with CDK inhibitors started 06/25/24 ---------------------------------------------------------------------------------------------------- MRI liver 12/26/2023: Significantly diminished  size of hepatic metastases (0.6 cm, 1.4 cm, (used to be 3.6 cm), 0.7 cm) MRI breast 12/31/2023: Significant positive response to chemo.  Both left malignancies decrease in size (3 cm with enhancement) and 1.1 cm CT CAP 12/26/2023: Significant decrease in breast masses, resolution of left axillary lymph nodes, decrease liver metastases   01/24/24: Bilateral Mastectomies Right: Benign Left: 2 residual tumors 2.8 cm and 1.6 cm, Margins neg, DCIS, ER 85%, PR: 25%, Her 2: 1+ Neg, Ki 67: 10% 06/04/2023: Hysterectomy and bilateral salpingo-oophorectomy    Current treatment:: Anti-estrogen therapy with Verzenio  started 06/26/2024 Adverse effects: Mild diarrhea: Managing well. Will keep her at 100 mg bid dose Neuropenia: Monitoring  for now Severe fatigue: Advised to take B 12 supplements   Liver MRI 04/12/2024: Stable 6 mm subcapsular left hepatic dome lesion similar, wedge-shaped deformity right hepatic lobe 1 x 2 cm without enhancement (treated metastatic lesion)   09/08/2024: embolization procedure with Dr. Karalee 10/22/2024: CT CAP: Interval increase in the ill-defined lesion left hepatic dome 1.5 x 3.1 cm compared to 1 x 1.4 cm Plan: PET/CT scan to further assess if the increase is hypermetabolic or whether it is procedure related. I sent a message to Dr. Aron to evaluate for liver resection Reduce the dosage of Verzinio to 50 p.o. twice daily because of drop in white blood cell count  Follow-up after the PET CT scan to discuss results   No orders of the defined types were placed in this encounter.  The patient has a good understanding of the overall plan. she agrees with it. she will call with any problems that may develop before the next visit here.  I personally spent a total of 30 minutes in the care of the patient today including preparing to see the patient, getting/reviewing separately obtained history, performing a medically appropriate exam/evaluation, counseling and educating,  placing orders, referring and communicating with other health care professionals, documenting clinical information in the EHR, independently interpreting results, communicating results, and coordinating care.   Ann K Osei Anger, MD 10/27/2024    "

## 2024-10-27 NOTE — Assessment & Plan Note (Signed)
 07/19/2023:Palpable left breast mass for 6 months.  Mammogram and ultrasound revealed large masses.   10:30 position: 4.3 cm with skin thickening and ulceration: Grade 2 IDC with high-grade DCIS ER 95%, PR 95%, Ki67 30%, HER2 2+ by IHC, FISH negative ratio 1.37;  5 o'clock position: 3.3 cm with calcifications spanning 6.7 cm, axilla negative, biopsy: Grade 3 IDC with necrosis ER 95% PR 90% Ki-67 60% HER2 1+ negative   CT CAP 08/03/2023: 2 prominent left breast masses, left axillary lymph nodes, right hepatic lobe lesion concerning for metastatic breast cancer Ultrasound a liver biopsy scheduled for 08/17/2023   Treatment plan: Neoadjuvant chemotherapy with dose Adriamycin  and Cytoxan  followed by Taxol  completed 12/27/2023 Mastectomy left breast 01/24/2024 Adjuvant radiation completed 04/16/24 Liver directed therapy (underwent microwave ablation 01/16/2024) Antiestrogen therapy with CDK inhibitors started 06/25/24 ---------------------------------------------------------------------------------------------------- MRI liver 12/26/2023: Significantly diminished size of hepatic metastases (0.6 cm, 1.4 cm, (used to be 3.6 cm), 0.7 cm) MRI breast 12/31/2023: Significant positive response to chemo.  Both left malignancies decrease in size (3 cm with enhancement) and 1.1 cm CT CAP 12/26/2023: Significant decrease in breast masses, resolution of left axillary lymph nodes, decrease liver metastases   01/24/24: Bilateral Mastectomies Right: Benign Left: 2 residual tumors 2.8 cm and 1.6 cm, Margins neg, DCIS, ER 85%, PR: 25%, Her 2: 1+ Neg, Ki 67: 10% 06/04/2023: Hysterectomy and bilateral salpingo-oophorectomy    Current treatment:: Anti-estrogen therapy with Verzenio  started 06/26/2024 Adverse effects: Mild diarrhea: Managing well. Will keep her at 100 mg bid dose Neuropenia: Monitoring for now Severe fatigue: Advised to take B 12 supplements   Liver MRI 04/12/2024: Stable 6 mm subcapsular left hepatic dome  lesion similar, wedge-shaped deformity right hepatic lobe 1 x 2 cm without enhancement (treated metastatic lesion)   09/08/2024: embolization procedure with Dr. Karalee 10/22/2024: CT CAP: Interval increase in the ill-defined lesion left hepatic dome 1.5 x 3.1 cm compared to 1 x 1.4 cm   Will discuss with surgery about resection

## 2024-10-27 NOTE — Progress Notes (Signed)
 Specialty Pharmacy Refill Coordination Note  Ann Daugherty is a 53 y.o. female contacted today regarding refills of specialty medication(s) Abemaciclib  (VERZENIO )   Patient requested [name] at [facility] at [place] date: 10/27/24   Medication will be filled on: 10/27/24

## 2024-10-28 ENCOUNTER — Other Ambulatory Visit (HOSPITAL_COMMUNITY): Payer: Self-pay

## 2024-11-03 ENCOUNTER — Encounter: Admitting: Adult Health

## 2024-11-03 ENCOUNTER — Other Ambulatory Visit

## 2024-11-03 ENCOUNTER — Ambulatory Visit

## 2024-11-03 DIAGNOSIS — Z483 Aftercare following surgery for neoplasm: Secondary | ICD-10-CM | POA: Diagnosis not present

## 2024-11-03 DIAGNOSIS — M25612 Stiffness of left shoulder, not elsewhere classified: Secondary | ICD-10-CM | POA: Diagnosis not present

## 2024-11-03 DIAGNOSIS — R600 Localized edema: Secondary | ICD-10-CM | POA: Diagnosis not present

## 2024-11-03 DIAGNOSIS — C50112 Malignant neoplasm of central portion of left female breast: Secondary | ICD-10-CM

## 2024-11-03 NOTE — Therapy (Signed)
 " OUTPATIENT PHYSICAL THERAPY ONCOLOGY EVALUATION  Patient Name: Ann Daugherty MRN: [id number] DOB:1970/12/17, 53 y.o., female Today's Date: 11/03/2024  END OF SESSION:  PT End of Session - 11/03/24 0906     Visit Number 2    Number of Visits 12    Date for Recertification  12/04/24    PT Start Time 0906    PT Stop Time 0957    PT Time Calculation (min) 51 min    Activity Tolerance Patient tolerated treatment well    Behavior During Therapy [facility] for tasks assessed/performed          Past Medical History:  Diagnosis Date   ADD (attention deficit disorder)    Anemia    Breast cancer metastasized to liver, unspecified laterality (HCC) 08/2023   01-16-2024 s/p microwave ablation large liver lesion w/ residual small lesion   Depression    GAD (generalized anxiety disorder)    GERD (gastroesophageal reflux disease)    History of antineoplastic chemotherapy 08/2023   08-15-2023  to  20-20-2025   History of external beam radiation therapy 01/24/2024   left chest wall   s/p bilateral mastectomy   01-24-2024  to  04-16-2024   Malignant neoplasm of upper-inner quadrant of left breast in female, estrogen receptor positive (HCC) 07/2023   oncologist-- dr catrina [facility] oncology--- dr forsyth/ surgeon -- dr aron;  dx 07/2023  HGDCIS/ ICDG2; liver bx confirmed mets ; Stage IV;  completed chemo 02/ 2025; 03/ 2025 s/p bilateral mastectomy; completed IMRT 04-16-2024; scheduled for Hystectomy w/ BSO 06-03-2024   Pap smear abnormality of cervix with LGSIL 05/20/2024   w/ + HPV   PONV (postoperative nausea and vomiting)    Port-A-Cath in place 08/14/2023   Past Surgical History:  Procedure Laterality Date   BREAST BIOPSY Left 07/19/2023   US  LT BREAST BX W LOC DEV EA ADD LESION IMG BX SPEC US  GUIDE 07/19/2023 [facility]   BREAST BIOPSY Left 07/19/2023   US  LT BREAST BX W LOC DEV 1ST LESION IMG BX SPEC US  GUIDE 07/19/2023 [facility]   BREAST BIOPSY Right 08/09/2023   US  RT  BREAST BX W LOC DEV 1ST LESION IMG BX SPEC US  GUIDE 08/09/2023 [facility]   BREAST BIOPSY Right 08/09/2023   US  RT BREAST BX W LOC DEV EA ADD LESION IMG BX SPEC US  GUIDE 08/09/2023 [facility]   CYSTOSCOPY N/A 06/03/2024   Procedure: CYSTOSCOPY;  Surgeon: Erik Kieth BROCKS, MD;  Location: [facility] OR;  Service: Gynecology;  Laterality: N/A;   DILATION AND CURETTAGE OF UTERUS  1990   IR ANGIOGRAM VISCERAL SELECTIVE  08/13/2024   IR EMBO TUMOR ORGAN ISCHEMIA INFARCT INC GUIDE ROADMAPPING  09/10/2024   IR GUIDED LIVER TUMOR ABLATION RFA PERC     IR RADIOLOGIST EVAL & MGMT  01/08/2024   IR RADIOLOGIST EVAL & MGMT  01/31/2024   IR RADIOLOGIST EVAL & MGMT  04/17/2024   IR RADIOLOGIST EVAL & MGMT  07/29/2024   IR RADIOLOGIST EVAL & MGMT  09/30/2024   LAMINECTOMY AND MICRODISCECTOMY LUMBAR SPINE  2011   by dr onetha;   re-do L4--5   LUMBAR LAMINECTOMY/DECOMPRESSION WITH DISCECTOMY  09/17/2006   @[facility]  by dr onetha;   L4-5   PORTACATH PLACEMENT Right 08/14/2023   Procedure: INSERTION PORT-A-CATH WITH ULTRASOUND ANDREA;  Surgeon: Aron Shoulders, MD;  Location: [facility] OR;  Service: General;  Laterality: Right;   SIMPLE MASTECTOMY WITH AXILLARY SENTINEL NODE BIOPSY Bilateral 01/24/2024   Procedure: BILATERAL MASTECTOMY;  Surgeon: Aron Shoulders, MD;  Location: [facility];  Service: General;  Laterality: Bilateral;  PEC BLOCK   TOTAL LAPAROSCOPIC HYSTERECTOMY WITH BILATERAL SALPINGO OOPHORECTOMY Bilateral 06/03/2024   Procedure: HYSTERECTOMY, TOTAL, LAPAROSCOPIC, WITH BILATERAL SALPINGO-OOPHORECTOMY;  Surgeon: Erik Kieth BROCKS, MD;  Location: [facility] OR;  Service: Gynecology;  Laterality: Bilateral;  TLH/BSO/cysto   Patient Active Problem List   Diagnosis Date Noted   Liver lesion 08/12/2024   Estrogen receptor positive 06/03/2024   Primary cancer of upper inner quadrant of left breast (HCC) 01/24/2024   Port-A-Cath in place 08/15/2023   Malignant neoplasm of upper-inner quadrant of left  female breast (HCC) 07/20/2023   Depression, major, single episode, mild 04/05/2021   Tobacco abuse 01/30/2018   Anxiety 01/30/2018   Attention deficit disorder 01/30/2018   Vitamin D  deficiency 01/30/2018      REFERRING PROVIDER: Shoulders Aron  REFERRING DIAG: LEFT shoulder stiffness  THERAPY DIAG:  Aftercare following surgery for neoplasm  Stiffness of left shoulder, not elsewhere classified  Malignant neoplasm of central portion of left female breast, unspecified estrogen receptor status (HCC)  Localized edema  ONSET DATE: 1 month ago  Rationale for Evaluation and Treatment: Rehabilitation  SUBJECTIVE:                                                                                                                                                                                           SUBJECTIVE STATEMENT:  I have been doing the exercises. Its still tight, but I do think its a little looser.  PERTINENT HISTORY:   Patient presented with new diagnosis of left breast cancer 07/2023 Core needle biopsies were performed. The 5 o'clock mass had grade 3 invasive ductal carcinoma that is ER/PR positive, her 2 negative, and Ki 67 60%. The 10:30 mass was grade 2 invasive ductal carcinoma, Er/PR +, her 2 negative and Ki 67 30%. Pt had neoadjuvant chemotherapy during which she had about 30# weight loss.  She had metastasis to liver and underwent a liver ablation 01/16/2024. Pt had bilateral mastectomy on 01/24/24 with no lymph nodes removed.  She has had radiation. She underwent a Y90 treatment for a liver MET on 09/10/2024. She is also s/p a hysterectomy and salpingo-oophorectomy win July 2024   PAIN:  Are you having pain? Yes, tight, discomfort NPRS scale: 0/10-1/10 Pain location: left chest, axilla, pecs Pain orientation: Left  PAIN TYPE: aching and tight Pain description: intermittent  Aggravating factors: reaching,reaching to the side, pushing, pulling Relieving factors: not  reaching to extremes  PRECAUTIONS: Left breast cancer;NO LN's removed  WEIGHT BEARING RESTRICTIONS: No  FALLS:  Has patient fallen in  last 6 months? Yes. Number of falls 1  LIVING ENVIRONMENT: Lives with: lives with her sons when not at school Lives in: House/apartment Stairs: Yes; Internal: 12 steps; on left going up  OCCUPATION: not working  LEISURE: pickleball, gym, hiking  HAND DOMINANCE: right   PRIOR LEVEL OF FUNCTION: Independent  PATIENT GOALS: increase ROM, reduce tightness   OBJECTIVE: Note: Objective measures were completed at Evaluation unless otherwise noted.  COGNITION: Overall cognitive status: Within functional limits for tasks assessed   PALPATION: Vender left pectorals, left axilla, bilateral drain sites  OBSERVATIONS / OTHER ASSESSMENTS: Mild swelling noted Right lateral chest, incisions nicely healed B, hyperpigmentation from radiation on left  SENSATION: Light touch: Deficits    POSTURE: forward head and shoulders . Left ribs rotated forward and prominent in standing   UPPER EXTREMITY AROM/PROM:  A/PROM RIGHT   eval   Shoulder extension 68  Shoulder flexion 165  Shoulder abduction 176  Shoulder internal rotation 70  Shoulder external rotation 105    (Blank rows = not tested)  A/PROM LEFT   eval  Shoulder extension 42  Shoulder flexion 155  Shoulder abduction 108  Shoulder internal rotation 68  Shoulder external rotation 88    (Blank rows = not tested)  CERVICAL AROM: All within normal limits:     UPPER EXTREMITY STRENGTH: WFL   LYMPHEDEMA ASSESSMENTS:   SURGERY TYPE/DATE: bilateral mastectomy on 01/24/24  NUMBER OF LYMPH NODES REMOVED: 0  CHEMOTHERAPY: Yes, Neoadjuvant  RADIATION:YES  HORMONE TREATMENT: YES    QUICK DASH SURVEY: 23%                                                                                                                             TREATMENT DATE:   11/03/2024  Pulleys flexion,  scaption, abd x 2 min ea, VC's to depress scapula and relax arm she is stretching Ball rolls on wall flexion x 5, abd x 5 ea Supine wand flexion and scaption x 4 ea LTR with arms outstretched x 3 B Snow angels 5 x 10 sec MFR to left chest region STM to left UT, pectorals, lateral trunk and left scapular area with cocoa butter PROM Left shoulder Flex, scaption, abd, ER.  10/23/2024 Educated in HEP to improve left shoulder MNF:Dlepwz Shoulder Flexion with Dowel ,- Single Arm Doorway Pec Stretch at 90 Degrees Abduction,Doorway Pec Stretch at 90 Degrees Abduction, Supine stargazer and shoulder stretch for abd at wall. Discussed POC,LOS, treatment interventions.  PATIENT EDUCATION:  Education details: Access Code: [email or web address] URL: [email or web address] Date: 10/23/2024 Prepared by: Grayce Sheldon  Exercises - Supine Shoulder Flexion/scaption with Dowel  - 2 x daily - 7 x weekly - 1 sets - 5 reps - 5 hold - Single Arm Doorway Pec Stretch at 90 Degrees Abduction  - 1 x daily - 7 x weekly - 1 sets - 3 reps - 20-30 hold - Doorway Pec Stretch at 90 Degrees Abduction  - 1 x daily -  7 x weekly - 1 sets - 3 reps - 20-30 hold Supine stargazer, and wall stretch for abd Person educated: Patient Education method: Explanation, Demonstration, and Handouts Education comprehension: verbalized understanding, returned demonstration, and verbal cues required  HOME EXERCISE PROGRAM:  Supine Shoulder Flexion/scaption with Dowel  - 2 x daily - 7 x weekly - 1 sets - 5 reps - 5 hold - Single Arm Doorway Pec Stretch at 90 Degrees Abduction  - 1 x daily - 7 x weekly - 1 sets - 3 reps - 20-30 hold - Doorway Pec Stretch at 90 Degrees Abduction  - 1 x daily - 7 x weekly - 1 sets - 3 reps - 20-30 hold Supine stargazer, and wall stretch for abd  ASSESSMENT:  CLINICAL IMPRESSION: Pt very tight in left pectorals and still limited especially with abduction and scaption, but ROM is improving  overall.   EVAL Patient is a 53 y.o. female who was seen today for physical therapy evaluation and treatment for complaints of left shoulder tightness with pain, and right lateral trunk swelling. She is s/p bilateral Mastectomies with No LN removal, but did have radiation on the left. She has stage IV breast cancer, but had recent procedures on her liver to treat the metastasis. She has not had results from CT yet. She was educated in shoulder ROM and chest stretching to start performing at home to help improve ROM/tightness. She will benefit from skilled PT to address deficits in ROM/flexibility, swelling and return to PLOF.  OBJECTIVE IMPAIRMENTS: decreased activity tolerance, decreased knowledge of condition, decreased ROM, decreased strength, increased edema, impaired flexibility, impaired UE functional use, postural dysfunction, and pain.   ACTIVITY LIMITATIONS: reach over head and hygiene/grooming  PARTICIPATION LIMITATIONS: doing all but limited with reaching with left UE  PERSONAL FACTORS: Stage IV breast cancer s/p bilateral Mastectomies, chemo and radiation are also affecting patient's functional outcome.   REHAB POTENTIAL: Good  CLINICAL DECISION MAKING: Stable/uncomplicated  EVALUATION COMPLEXITY: Low  GOALS: Goals reviewed with patient? Yes  SHORT TERM GOALS: Target date: 12/05/2023  Pt will be independent in a HEP to improve left shoulder/chest ROM Baseline: Goal status: INITIAL  2.  Pt will have left shoulder abduction atleast 160 degrees for improved ability to perform hair care and lateral reaching Baseline:  Goal status: INITIAL  3.  Pt will have left shoulder flexion atleast 162 degrees for ability to reach into overhead cabinets Baseline:  Goal status: INITIAL  4.  Pt will have quick dash decreased to no greater than 10 percent to demonstrate improved function Baseline:  Goal status: INITIAL  5. Pt will be independent with MLD for right chest  swelling Baseline:  Goal status: INITIAL  PLAN:  PT FREQUENCY: 2x/week  PT DURATION: 6 weeks  PLANNED INTERVENTIONS: [id number]- PT Re-evaluation, 97750- Physical Performance Testing, 97110-Therapeutic exercises, 97530- Therapeutic activity, [id number]- Neuromuscular re-education, 97535- Self Care, [id number]- Manual therapy, 97760- Orthotic Initial, and [id number]- Orthotic/Prosthetic subsequent  PLAN FOR NEXT SESSION: Review HEP,  add lat trunk stretches,STM to UT/pecs, lat stretch,PROM, pulleys, LTR, foam roll when ready   Grayce JINNY Sheldon, PT 11/03/2024, 10:00 AM  "

## 2024-11-07 ENCOUNTER — Encounter (HOSPITAL_COMMUNITY)
Admission: RE | Admit: 2024-11-07 | Discharge: 2024-11-07 | Disposition: A | Discharge status: Home / Self Care | Source: Ambulatory Visit | Attending: Hematology and Oncology | Admitting: Hematology and Oncology

## 2024-11-07 DIAGNOSIS — Z170 Estrogen receptor positive status [ER+]: Secondary | ICD-10-CM | POA: Diagnosis present

## 2024-11-07 DIAGNOSIS — R160 Hepatomegaly, not elsewhere classified: Secondary | ICD-10-CM | POA: Insufficient documentation

## 2024-11-07 DIAGNOSIS — C50212 Malignant neoplasm of upper-inner quadrant of left female breast: Secondary | ICD-10-CM | POA: Diagnosis present

## 2024-11-07 LAB — GLUCOSE, CAPILLARY: Glucose-Capillary: 102 mg/dL — ABNORMAL HIGH (ref 70–99)

## 2024-11-07 MED ORDER — FLUDEOXYGLUCOSE F - 18 (FDG) INJECTION
6.7200 | Freq: Once | INTRAVENOUS | Status: AC
  Administered 2024-11-07: 6.72 via INTRAVENOUS

## 2024-11-07 MED ORDER — HEPARIN SOD (PORK) LOCK FLUSH 100 UNIT/ML IV SOLN
500.0000 [IU] | Freq: Once | INTRAVENOUS | Status: AC
  Administered 2024-11-07: 500 [IU] via INTRAVENOUS

## 2024-11-10 ENCOUNTER — Encounter: Payer: Self-pay | Admitting: Hematology and Oncology

## 2024-11-10 ENCOUNTER — Ambulatory Visit: Attending: General Surgery

## 2024-11-10 DIAGNOSIS — C50112 Malignant neoplasm of central portion of left female breast: Secondary | ICD-10-CM | POA: Insufficient documentation

## 2024-11-10 DIAGNOSIS — Z483 Aftercare following surgery for neoplasm: Secondary | ICD-10-CM | POA: Diagnosis present

## 2024-11-10 DIAGNOSIS — R600 Localized edema: Secondary | ICD-10-CM | POA: Insufficient documentation

## 2024-11-10 DIAGNOSIS — M25612 Stiffness of left shoulder, not elsewhere classified: Secondary | ICD-10-CM | POA: Diagnosis present

## 2024-11-10 NOTE — Therapy (Signed)
 " OUTPATIENT PHYSICAL THERAPY ONCOLOGY TREATMENT  Patient Name: Ann Daugherty MRN: [id number] DOB:07-10-1971, 54 y.o., female Today's Date: 11/10/2024  END OF SESSION:  PT End of Session - 11/10/24 1101     Visit Number 3    Number of Visits 12    Date for Recertification  12/04/24    PT Start Time 1101    PT Stop Time 1159    PT Time Calculation (min) 58 min    Activity Tolerance Patient tolerated treatment well    Behavior During Therapy [facility] for tasks assessed/performed          Past Medical History:  Diagnosis Date   ADD (attention deficit disorder)    Anemia    Breast cancer metastasized to liver, unspecified laterality (HCC) 08/2023   01-16-2024 s/p microwave ablation large liver lesion w/ residual small lesion   Depression    GAD (generalized anxiety disorder)    GERD (gastroesophageal reflux disease)    History of antineoplastic chemotherapy 08/2023   08-15-2023  to  20-20-2025   History of external beam radiation therapy 01/24/2024   left chest wall   s/p bilateral mastectomy   01-24-2024  to  04-16-2024   Malignant neoplasm of upper-inner quadrant of left breast in female, estrogen receptor positive (HCC) 07/2023   oncologist-- dr catrina gyn oncology--- dr forsyth/ surgeon -- dr aron;  dx 07/2023  HGDCIS/ ICDG2; liver bx confirmed mets ; Stage IV;  completed chemo 02/ 2025; 03/ 2025 s/p bilateral mastectomy; completed IMRT 04-16-2024; scheduled for Hystectomy w/ BSO 06-03-2024   Pap smear abnormality of cervix with LGSIL 05/20/2024   w/ + HPV   PONV (postoperative nausea and vomiting)    Port-A-Cath in place 08/14/2023   Past Surgical History:  Procedure Laterality Date   BREAST BIOPSY Left 07/19/2023   US  LT BREAST BX W LOC DEV EA ADD LESION IMG BX SPEC US  GUIDE 07/19/2023 [facility]   BREAST BIOPSY Left 07/19/2023   US  LT BREAST BX W LOC DEV 1ST LESION IMG BX SPEC US  GUIDE 07/19/2023 [facility]   BREAST BIOPSY Right 08/09/2023   US  RT  BREAST BX W LOC DEV 1ST LESION IMG BX SPEC US  GUIDE 08/09/2023 [facility]   BREAST BIOPSY Right 08/09/2023   US  RT BREAST BX W LOC DEV EA ADD LESION IMG BX SPEC US  GUIDE 08/09/2023 [facility]   CYSTOSCOPY N/A 06/03/2024   Procedure: CYSTOSCOPY;  Surgeon: Erik Kieth BROCKS, MD;  Location: [facility] OR;  Service: Gynecology;  Laterality: N/A;   DILATION AND CURETTAGE OF UTERUS  1990   IR ANGIOGRAM VISCERAL SELECTIVE  08/13/2024   IR EMBO TUMOR ORGAN ISCHEMIA INFARCT INC GUIDE ROADMAPPING  09/10/2024   IR GUIDED LIVER TUMOR ABLATION RFA PERC     IR RADIOLOGIST EVAL & MGMT  01/08/2024   IR RADIOLOGIST EVAL & MGMT  01/31/2024   IR RADIOLOGIST EVAL & MGMT  04/17/2024   IR RADIOLOGIST EVAL & MGMT  07/29/2024   IR RADIOLOGIST EVAL & MGMT  09/30/2024   LAMINECTOMY AND MICRODISCECTOMY LUMBAR SPINE  2011   by dr onetha;   re-do L4--5   LUMBAR LAMINECTOMY/DECOMPRESSION WITH DISCECTOMY  09/17/2006   @[facility]  by dr onetha;   L4-5   PORTACATH PLACEMENT Right 08/14/2023   Procedure: INSERTION PORT-A-CATH WITH ULTRASOUND ANDREA;  Surgeon: Aron Shoulders, MD;  Location: [facility] OR;  Service: General;  Laterality: Right;   SIMPLE MASTECTOMY WITH AXILLARY SENTINEL NODE BIOPSY Bilateral 01/24/2024   Procedure: BILATERAL MASTECTOMY;  Surgeon: Aron Shoulders, MD;  Location: [facility];  Service: General;  Laterality: Bilateral;  PEC BLOCK   TOTAL LAPAROSCOPIC HYSTERECTOMY WITH BILATERAL SALPINGO OOPHORECTOMY Bilateral 06/03/2024   Procedure: HYSTERECTOMY, TOTAL, LAPAROSCOPIC, WITH BILATERAL SALPINGO-OOPHORECTOMY;  Surgeon: Erik Kieth BROCKS, MD;  Location: [facility];  Service: Gynecology;  Laterality: Bilateral;  TLH/BSO/cysto   Patient Active Problem List   Diagnosis Date Noted   Liver lesion 08/12/2024   Estrogen receptor positive 06/03/2024   Primary cancer of upper inner quadrant of left breast (HCC) 01/24/2024   Port-A-Cath in place 08/15/2023   Malignant neoplasm of upper-inner quadrant of left  female breast (HCC) 07/20/2023   Depression, major, single episode, mild 04/05/2021   Tobacco abuse 01/30/2018   Anxiety 01/30/2018   Attention deficit disorder 01/30/2018   Vitamin D  deficiency 01/30/2018      REFERRING PROVIDER: Shoulders Aron  REFERRING DIAG: LEFT shoulder stiffness  THERAPY DIAG:  Aftercare following surgery for neoplasm  Stiffness of left shoulder, not elsewhere classified  Malignant neoplasm of central portion of left female breast, unspecified estrogen receptor status (HCC)  Localized edema  ONSET DATE: 1 month ago  Rationale for Evaluation and Treatment: Rehabilitation  SUBJECTIVE:                                                                                                                                                                                           SUBJECTIVE STATEMENT:  I see my oncologist tomorrow morning after I have labs. Then I have my appt with my intervention radiologist tomorrow for a telehealth visit about my latest scans. I need for you to review scar massage with me.    PERTINENT HISTORY:   Patient presented with new diagnosis of left breast cancer 07/2023 Core needle biopsies were performed. The 5 o'clock mass had grade 3 invasive ductal carcinoma that is ER/PR positive, her 2 negative, and Ki 67 60%. The 10:30 mass was grade 2 invasive ductal carcinoma, Er/PR +, her 2 negative and Ki 67 30%. Pt had neoadjuvant chemotherapy during which she had about 30# weight loss.  She had metastasis to liver and underwent a liver ablation 01/16/2024. Pt had bilateral mastectomy on 01/24/24 with no lymph nodes removed.  She has had radiation. She underwent a Y90 treatment for a liver MET on 09/10/2024. She is also s/p a hysterectomy and salpingo-oophorectomy win July 2024   PAIN:  Are you having pain? Yes, tight, discomfort NPRS scale: 0/10-1/10 Pain location: left chest, axilla, pecs Pain orientation: Left  PAIN TYPE: aching and tight Pain  description: intermittent  Aggravating factors: reaching,reaching to the side, pushing, pulling Relieving factors:  not reaching to extremes  PRECAUTIONS: Left breast cancer;NO LN's removed  WEIGHT BEARING RESTRICTIONS: No  FALLS:  Has patient fallen in last 6 months? Yes. Number of falls 1  LIVING ENVIRONMENT: Lives with: lives with her sons when not at school Lives in: House/apartment Stairs: Yes; Internal: 12 steps; on left going up  OCCUPATION: not working  LEISURE: pickleball, gym, hiking  HAND DOMINANCE: right   PRIOR LEVEL OF FUNCTION: Independent  PATIENT GOALS: increase ROM, reduce tightness   OBJECTIVE: Note: Objective measures were completed at Evaluation unless otherwise noted.  COGNITION: Overall cognitive status: Within functional limits for tasks assessed   PALPATION: Vender left pectorals, left axilla, bilateral drain sites  OBSERVATIONS / OTHER ASSESSMENTS: Mild swelling noted Right lateral chest, incisions nicely healed B, hyperpigmentation from radiation on left  SENSATION: Light touch: Deficits    POSTURE: forward head and shoulders . Left ribs rotated forward and prominent in standing   UPPER EXTREMITY AROM/PROM:  A/PROM RIGHT   eval   Shoulder extension 68  Shoulder flexion 165  Shoulder abduction 176  Shoulder internal rotation 70  Shoulder external rotation 105    (Blank rows = not tested)  A/PROM LEFT   eval  Shoulder extension 42  Shoulder flexion 155  Shoulder abduction 108  Shoulder internal rotation 68  Shoulder external rotation 88    (Blank rows = not tested)  CERVICAL AROM: All within normal limits:     UPPER EXTREMITY STRENGTH: WFL   LYMPHEDEMA ASSESSMENTS:   SURGERY TYPE/DATE: bilateral mastectomy on 01/24/24  NUMBER OF LYMPH NODES REMOVED: 0  CHEMOTHERAPY: Yes, Neoadjuvant  RADIATION:YES  HORMONE TREATMENT: YES    QUICK DASH SURVEY: 23%                                                                                                                              TREATMENT DATE:  11/10/24: Therapeutic Exercises Pulleys into flex x 1 min, and then x 2 min with VC's throughout to decrease scap compensation Roll yellow ball up wall into Lt UE abd x 10 returning therapist demo Therapeutic Activities Supine over full foam roll for following: Bil UE horz abd x 10, bil UE scaption into a V x 10, then bil UE abd in a snow angel x 10 with 5 sec holds; pt reports feeling very good stretch with these and VC's during for core engagement for stability Manual Therapy MLD to Rt lateral trunk: Short neck, superficial and deep abdominals, Rt inguinal nodes and Rt axillo-inguinal anastomosis and then focused on area of edema at Rt lateral trunk P/ROM to Lt shoulder in supine into flex, abd and D2 with scapular depression by therapist throughout STM to tightness at Lt lateral trunk and at pectoralis MFR to Lt chest wall and mastectomy incision, also at pect tendon tightness at end ROMs  11/03/2024  Pulleys flexion, scaption, abd x 2 min ea, VC's to depress scapula and relax arm she is stretching  Ball rolls on wall flexion x 5, abd x 5 ea Supine wand flexion and scaption x 4 ea LTR with arms outstretched x 3 B Snow angels 5 x 10 sec MFR to left chest region STM to left UT, pectorals, lateral trunk and left scapular area with cocoa butter PROM Left shoulder Flex, scaption, abd, ER.  10/23/2024 Educated in HEP to improve left shoulder MNF:Dlepwz Shoulder Flexion with Dowel ,- Single Arm Doorway Pec Stretch at 90 Degrees Abduction,Doorway Pec Stretch at 90 Degrees Abduction, Supine stargazer and shoulder stretch for abd at wall. Discussed POC,LOS, treatment interventions.  PATIENT EDUCATION:  Education details: Access Code: [email or web address] URL: [email or web address] Date: 10/23/2024 Prepared by: Grayce Sheldon  Exercises - Supine Shoulder Flexion/scaption with Dowel  - 2 x daily - 7 x weekly -  1 sets - 5 reps - 5 hold - Single Arm Doorway Pec Stretch at 90 Degrees Abduction  - 1 x daily - 7 x weekly - 1 sets - 3 reps - 20-30 hold - Doorway Pec Stretch at 90 Degrees Abduction  - 1 x daily - 7 x weekly - 1 sets - 3 reps - 20-30 hold Supine stargazer, and wall stretch for abd Person educated: Patient Education method: Explanation, Demonstration, and Handouts Education comprehension: verbalized understanding, returned demonstration, and verbal cues required  HOME EXERCISE PROGRAM:  Supine Shoulder Flexion/scaption with Dowel  - 2 x daily - 7 x weekly - 1 sets - 5 reps - 5 hold - Single Arm Doorway Pec Stretch at 90 Degrees Abduction  - 1 x daily - 7 x weekly - 1 sets - 3 reps - 20-30 hold - Doorway Pec Stretch at 90 Degrees Abduction  - 1 x daily - 7 x weekly - 1 sets - 3 reps - 20-30 hold Supine stargazer, and wall stretch for abd  ASSESSMENT:  CLINICAL IMPRESSION: Added supine over foam roll for increased end ROM stretches which pt reported feeling very good stretches with in Lt pect. Then continued manual therapy adding MLD to Lt lateral trunk and continued manual therapy techniques to decrease fascial restrictions at Lt upper quadrant. Reviewed correct scar tissue techniques with pt. She was performing correctly, but had forgotten to do some before adding Vit E oil to skin to allow for improved skin stretch for the MFR, the continued with addition of oil for more scar massage. Pt verbalized good understanding.    EVAL Patient is a 54 y.o. female who was seen today for physical therapy evaluation and treatment for complaints of left shoulder tightness with pain, and right lateral trunk swelling. She is s/p bilateral Mastectomies with No LN removal, but did have radiation on the left. She has stage IV breast cancer, but had recent procedures on her liver to treat the metastasis. She has not had results from CT yet. She was educated in shoulder ROM and chest stretching to start  performing at home to help improve ROM/tightness. She will benefit from skilled PT to address deficits in ROM/flexibility, swelling and return to PLOF.  OBJECTIVE IMPAIRMENTS: decreased activity tolerance, decreased knowledge of condition, decreased ROM, decreased strength, increased edema, impaired flexibility, impaired UE functional use, postural dysfunction, and pain.   ACTIVITY LIMITATIONS: reach over head and hygiene/grooming  PARTICIPATION LIMITATIONS: doing all but limited with reaching with left UE  PERSONAL FACTORS: Stage IV breast cancer s/p bilateral Mastectomies, chemo and radiation are also affecting patient's functional outcome.   REHAB POTENTIAL: Good  CLINICAL DECISION MAKING: Stable/uncomplicated  EVALUATION COMPLEXITY: Low  GOALS: Goals reviewed with patient? Yes  SHORT TERM GOALS: Target date: 12/05/2023  Pt will be independent in a HEP to improve left shoulder/chest ROM Baseline: Goal status: INITIAL  2.  Pt will have left shoulder abduction atleast 160 degrees for improved ability to perform hair care and lateral reaching Baseline:  Goal status: INITIAL  3.  Pt will have left shoulder flexion atleast 162 degrees for ability to reach into overhead cabinets Baseline:  Goal status: INITIAL  4.  Pt will have quick dash decreased to no greater than 10 percent to demonstrate improved function Baseline:  Goal status: INITIAL  5. Pt will be independent with MLD for right chest swelling Baseline:  Goal status: INITIAL  PLAN:  PT FREQUENCY: 2x/week  PT DURATION: 6 weeks  PLANNED INTERVENTIONS: [id number]- PT Re-evaluation, 97750- Physical Performance Testing, 97110-Therapeutic exercises, 97530- Therapeutic activity, [id number]- Neuromuscular re-education, 97535- Self Care, [id number]- Manual therapy, 97760- Orthotic Initial, and [id number]- Orthotic/Prosthetic subsequent  PLAN FOR NEXT SESSION: Review HEP,  add lat trunk stretches, STM to UT/pecs, lat stretch,PROM, pulleys,  LTR, cont with foam roll and try laying to it horizontal as well on Rt sie for Lt OH reach/lateral trunk stretch   Aden Berwyn Caldron, PTA 11/10/2024, 12:08 PM  "

## 2024-11-11 ENCOUNTER — Other Ambulatory Visit: Payer: Self-pay

## 2024-11-11 ENCOUNTER — Ambulatory Visit
Admission: RE | Admit: 2024-11-11 | Discharge: 2024-11-11 | Disposition: A | Source: Ambulatory Visit | Attending: Interventional Radiology | Admitting: Interventional Radiology

## 2024-11-11 ENCOUNTER — Telehealth

## 2024-11-11 ENCOUNTER — Inpatient Hospital Stay (HOSPITAL_BASED_OUTPATIENT_CLINIC_OR_DEPARTMENT_OTHER): Admitting: Hematology and Oncology

## 2024-11-11 ENCOUNTER — Inpatient Hospital Stay: Attending: Adult Health

## 2024-11-11 VITALS — BP 102/68 | HR 73 | Temp 98.0°F | Resp 18 | Ht 66.0 in | Wt 137.6 lb

## 2024-11-11 DIAGNOSIS — C50212 Malignant neoplasm of upper-inner quadrant of left female breast: Secondary | ICD-10-CM

## 2024-11-11 DIAGNOSIS — R350 Frequency of micturition: Secondary | ICD-10-CM | POA: Insufficient documentation

## 2024-11-11 DIAGNOSIS — Z79811 Long term (current) use of aromatase inhibitors: Secondary | ICD-10-CM | POA: Diagnosis not present

## 2024-11-11 DIAGNOSIS — Z170 Estrogen receptor positive status [ER+]: Secondary | ICD-10-CM

## 2024-11-11 DIAGNOSIS — Z17411 Hormone receptor positive with human epidermal growth factor receptor 2 negative status: Secondary | ICD-10-CM | POA: Insufficient documentation

## 2024-11-11 DIAGNOSIS — R300 Dysuria: Secondary | ICD-10-CM

## 2024-11-11 DIAGNOSIS — C787 Secondary malignant neoplasm of liver and intrahepatic bile duct: Secondary | ICD-10-CM

## 2024-11-11 DIAGNOSIS — Z79899 Other long term (current) drug therapy: Secondary | ICD-10-CM | POA: Insufficient documentation

## 2024-11-11 HISTORY — PX: IR RADIOLOGIST EVAL & MGMT: IMG5224

## 2024-11-11 LAB — URINALYSIS, COMPLETE (UACMP) WITH MICROSCOPIC
Bilirubin Urine: NEGATIVE
Glucose, UA: NEGATIVE mg/dL
Hgb urine dipstick: NEGATIVE
Ketones, ur: 5 mg/dL — AB
Nitrite: NEGATIVE
Protein, ur: 30 mg/dL — AB
Specific Gravity, Urine: 1.024 (ref 1.005–1.030)
pH: 6 (ref 5.0–8.0)

## 2024-11-11 LAB — CBC WITH DIFFERENTIAL (CANCER CENTER ONLY)
Abs Immature Granulocytes: 0.01 K/uL (ref 0.00–0.07)
Basophils Absolute: 0 K/uL (ref 0.0–0.1)
Basophils Relative: 1 %
Eosinophils Absolute: 0.1 K/uL (ref 0.0–0.5)
Eosinophils Relative: 2 %
HCT: 36.4 % (ref 36.0–46.0)
Hemoglobin: 12.6 g/dL (ref 12.0–15.0)
Immature Granulocytes: 0 %
Lymphocytes Relative: 10 %
Lymphs Abs: 0.3 K/uL — ABNORMAL LOW (ref 0.7–4.0)
MCH: 32.6 pg (ref 26.0–34.0)
MCHC: 34.6 g/dL (ref 30.0–36.0)
MCV: 94.1 fL (ref 80.0–100.0)
Monocytes Absolute: 0.2 K/uL (ref 0.1–1.0)
Monocytes Relative: 7 %
Neutro Abs: 2.6 K/uL (ref 1.7–7.7)
Neutrophils Relative %: 80 %
Platelet Count: 240 K/uL (ref 150–400)
RBC: 3.87 MIL/uL (ref 3.87–5.11)
RDW: 12.6 % (ref 11.5–15.5)
WBC Count: 3.3 K/uL — ABNORMAL LOW (ref 4.0–10.5)
nRBC: 0 % (ref 0.0–0.2)

## 2024-11-11 LAB — CMP (CANCER CENTER ONLY)
ALT: 16 U/L (ref 0–44)
AST: 26 U/L (ref 15–41)
Albumin: 4.5 g/dL (ref 3.5–5.0)
Alkaline Phosphatase: 123 U/L (ref 38–126)
Anion gap: 9 (ref 5–15)
BUN: 11 mg/dL (ref 6–20)
CO2: 27 mmol/L (ref 22–32)
Calcium: 9.6 mg/dL (ref 8.9–10.3)
Chloride: 101 mmol/L (ref 98–111)
Creatinine: 0.92 mg/dL (ref 0.44–1.00)
GFR, Estimated: >60 mL/min
Glucose, Bld: 119 mg/dL — ABNORMAL HIGH (ref 70–99)
Potassium: 4.1 mmol/L (ref 3.5–5.1)
Sodium: 137 mmol/L (ref 135–145)
Total Bilirubin: 0.3 mg/dL (ref 0.0–1.2)
Total Protein: 7.3 g/dL (ref 6.5–8.1)

## 2024-11-11 NOTE — Assessment & Plan Note (Signed)
 07/19/2023:Palpable left breast mass for 6 months.  Mammogram and ultrasound revealed large masses.   10:30 position: 4.3 cm with skin thickening and ulceration: Grade 2 IDC with high-grade DCIS ER 95%, PR 95%, Ki67 30%, HER2 2+ by IHC, FISH negative ratio 1.37;  5 o'clock position: 3.3 cm with calcifications spanning 6.7 cm, axilla negative, biopsy: Grade 3 IDC with necrosis ER 95% PR 90% Ki-67 60% HER2 1+ negative   CT CAP 08/03/2023: 2 prominent left breast masses, left axillary lymph nodes, right hepatic lobe lesion concerning for metastatic breast cancer Ultrasound a liver biopsy scheduled for 08/17/2023   Treatment plan: Neoadjuvant chemotherapy with dose Adriamycin  and Cytoxan  followed by Taxol  completed 12/27/2023 Mastectomy left breast 01/24/2024 Adjuvant radiation completed 04/16/24 Liver directed therapy (underwent microwave ablation 01/16/2024) Antiestrogen therapy with CDK inhibitors started 06/25/24 ---------------------------------------------------------------------------------------------------- MRI liver 12/26/2023: Significantly diminished size of hepatic metastases (0.6 cm, 1.4 cm, (used to be 3.6 cm), 0.7 cm) MRI breast 12/31/2023: Significant positive response to chemo.  Both left malignancies decrease in size (3 cm with enhancement) and 1.1 cm CT CAP 12/26/2023: Significant decrease in breast masses, resolution of left axillary lymph nodes, decrease liver metastases   01/24/24: Bilateral Mastectomies Right: Benign Left: 2 residual tumors 2.8 cm and 1.6 cm, Margins neg, DCIS, ER 85%, PR: 25%, Her 2: 1+ Neg, Ki 67: 10% 06/04/2023: Hysterectomy and bilateral salpingo-oophorectomy    Current treatment:: Anti-estrogen therapy with Verzenio  started 06/26/2024 Adverse effects: Mild diarrhea: Managing well. Will keep her at 100 mg bid dose Neuropenia: Monitoring for now Severe fatigue: Advised to take B 12 supplements   Liver MRI 04/12/2024: Stable 6 mm subcapsular left hepatic dome  lesion similar, wedge-shaped deformity right hepatic lobe 1 x 2 cm without enhancement (treated metastatic lesion)   09/08/2024: embolization procedure with Dr. Karalee 10/22/2024: CT CAP: Interval increase in the ill-defined lesion left hepatic dome 1.5 x 3.1 cm compared to 1 x 1.4 cm Plan: PET/CT scan 11/07/2024: No focal metabolic activity in the liver.  Low-attenuation superior right hepatic lobe due to prior embolization no other metastatic disease I will discuss with Dr. Aron to hold off on any liver resection. Reduce the dosage of Verzinio to 50 p.o. twice daily because of drop in white blood cell count

## 2024-11-11 NOTE — Progress Notes (Signed)
 "  This encounter was conducted via the [employer] providing interactive audio and visual communication.  The patient provided verbal consent to conduct a virtual appointment.  The patient was located at their primary residence during this encounter.   Chief Complaint: The patient is seen in follow up today s/p Y90 radioembolization on 11/5  Referring Physician(s): Dr. Mackey Chad  Supervising Physician: Karalee Beat  History of present illness:  Ann Daugherty is a 54 y.o. female with a history of left breast cancer stage IV -  (cT4b, cN0, cM1, G3, ER+, PR+, HER2-).  She has lesions in her liver and underwent biopsy of one of the liver lesions in October 2024 confirming metastatic carcinoma from breast primary.   She has recently completed neoadjuvant chemotherapy with Adriamycin  and Cytoxan  followed by Taxol  completed in February 2025.   She is now a candidate for potential liver directed therapy.  The dominant lesion in segment 6 has regressed from 3.6 cm to 1.4 cm and is an excellent candidate for percutaneous microwave ablation.   There is a smaller subcentimeter lesion in the anterior dome which is questionable and warrants close observation.   She had a technically successful percutaneous thermal ablation of the segment 6 lesion on 01/16/2024.  The procedure went well and without complication and she was discharged home the same day.   MRI 04/12/24 - Nonenhancing area in the right hepatic lobe, segment 6 measuring 1.0 x 2.0 cm. There is no abnormal enhancement. Findings favor treated metastatic lesion. - There is a stable approximately 6 x 6 mm enhancing lesion in the subcapsular left hepatic dome, segment 4A, which appears grossly similar to the prior study. No new suspicious liver lesions seen.   MRI 07/25/24 1. There is a 1.0 x 1.4 cm subcapsular lesion in the left hepatic lobe, segment 4A, which has increased in size since the prior study and is concerning for a  solitary liver metastasis. 2. Redemonstration of peripheral wedge-shaped nonenhancing area in the right hepatic lobe, segment 6, which is favored to represent involuting treated lesion. No local recurrence noted. 3. No other metastatic disease identified within the abdomen.  She elected to proceed with Y90 segmentectomy of the probable metastasis which was completed on 11/5. At her 2 week follow up on 11/25 she reported doing very well with minimal nausea and had resumed her full range of activities including exercise. Today she reports that she is doing very well.    PET CT 11/07/24 IMPRESSION: 1. No focal metabolic activity in the liver to localize residual or recurrent liver metastasis. 2. Subtle low attenuation in the superior right hepatic lobe, consistent with treatment-related change. 3. No evidence of skeletal metastasis.    Past Medical History:  Diagnosis Date   ADD (attention deficit disorder)    Anemia    Breast cancer metastasized to liver, unspecified laterality (HCC) 08/2023   01-16-2024 s/p microwave ablation large liver lesion w/ residual small lesion   Depression    GAD (generalized anxiety disorder)    GERD (gastroesophageal reflux disease)    History of antineoplastic chemotherapy 08/2023   08-15-2023  to  20-20-2025   History of external beam radiation therapy 01/24/2024   left chest wall   s/p bilateral mastectomy   01-24-2024  to  04-16-2024   Malignant neoplasm of upper-inner quadrant of left breast in female, estrogen receptor positive (HCC) 07/2023   oncologist-- dr catrina [facility] oncology--- dr forsyth/ surgeon -- dr aron;  dx 07/2023  HGDCIS/ ICDG2;  liver bx confirmed mets ; Stage IV;  completed chemo 02/ 2025; 03/ 2025 s/p bilateral mastectomy; completed IMRT 04-16-2024; scheduled for Hystectomy w/ BSO 06-03-2024   Pap smear abnormality of cervix with LGSIL 05/20/2024   w/ + HPV   PONV (postoperative nausea and vomiting)    Port-A-Cath in place 08/14/2023     Past Surgical History:  Procedure Laterality Date   BREAST BIOPSY Left 07/19/2023   US  LT BREAST BX W LOC DEV EA ADD LESION IMG BX SPEC US  GUIDE 07/19/2023 [facility]   BREAST BIOPSY Left 07/19/2023   US  LT BREAST BX W LOC DEV 1ST LESION IMG BX SPEC US  GUIDE 07/19/2023 [facility]   BREAST BIOPSY Right 08/09/2023   US  RT BREAST BX W LOC DEV 1ST LESION IMG BX SPEC US  GUIDE 08/09/2023 [facility]   BREAST BIOPSY Right 08/09/2023   US  RT BREAST BX W LOC DEV EA ADD LESION IMG BX SPEC US  GUIDE 08/09/2023 [facility]   CYSTOSCOPY N/A 06/03/2024   Procedure: CYSTOSCOPY;  Surgeon: Erik Kieth BROCKS, MD;  Location: [facility] OR;  Service: Gynecology;  Laterality: N/A;   DILATION AND CURETTAGE OF UTERUS  1990   IR ANGIOGRAM VISCERAL SELECTIVE  08/13/2024   IR EMBO TUMOR ORGAN ISCHEMIA INFARCT INC GUIDE ROADMAPPING  09/10/2024   IR GUIDED LIVER TUMOR ABLATION RFA PERC     IR RADIOLOGIST EVAL & MGMT  01/08/2024   IR RADIOLOGIST EVAL & MGMT  01/31/2024   IR RADIOLOGIST EVAL & MGMT  04/17/2024   IR RADIOLOGIST EVAL & MGMT  07/29/2024   IR RADIOLOGIST EVAL & MGMT  09/30/2024   LAMINECTOMY AND MICRODISCECTOMY LUMBAR SPINE  2011   by dr onetha;   re-do L4--5   LUMBAR LAMINECTOMY/DECOMPRESSION WITH DISCECTOMY  09/17/2006   @[facility]  by dr onetha;   L4-5   PORTACATH PLACEMENT Right 08/14/2023   Procedure: INSERTION PORT-A-CATH WITH ULTRASOUND ANDREA;  Surgeon: Aron Shoulders, MD;  Location: [facility] OR;  Service: General;  Laterality: Right;   SIMPLE MASTECTOMY WITH AXILLARY SENTINEL NODE BIOPSY Bilateral 01/24/2024   Procedure: BILATERAL MASTECTOMY;  Surgeon: Aron Shoulders, MD;  Location: [facility];  Service: General;  Laterality: Bilateral;  PEC BLOCK   TOTAL LAPAROSCOPIC HYSTERECTOMY WITH BILATERAL SALPINGO OOPHORECTOMY Bilateral 06/03/2024   Procedure: HYSTERECTOMY, TOTAL, LAPAROSCOPIC, WITH BILATERAL SALPINGO-OOPHORECTOMY;  Surgeon: Erik Kieth BROCKS, MD;  Location: [facility] OR;   Service: Gynecology;  Laterality: Bilateral;  TLH/BSO/cysto    Allergies: Patient has no known allergies.  Medications: Prior to Admission medications  Medication Sig Start Date End Date Taking? Authorizing Provider  abemaciclib  (VERZENIO ) 50 MG tablet Take 1 tablet (50 mg total) by mouth 2 (two) times daily. 10/27/24   Gudena, Vinay, MD  acetaminophen  (TYLENOL ) 500 MG tablet Take 2 tablets (1,000 mg total) by mouth every 6 (six) hours. 06/03/24   Erik Kieth BROCKS, MD  anastrozole  (ARIMIDEX ) 1 MG tablet Take 1 tablet (1 mg total) by mouth daily. 06/19/24   Crawford Morna Pickle, NP  calcium carbonate (TUMS - DOSED IN MG ELEMENTAL CALCIUM) 500 MG chewable tablet Chew 1 tablet by mouth daily as needed (low calcium).    [provider]  Cholecalciferol (VITAMIN D3) 5000 units CAPS Take 5,000 Units by mouth daily. 12/07/16   [provider]  citalopram  (CELEXA ) 40 MG tablet Take 1 tablet (40 mg total) by mouth daily. 04/22/24   Causey, Morna Pickle, NP  Cranberry 500 MG TABS Take 1 tablet by mouth 3 (three) times a week.  [provider]  Cyanocobalamin (VITAMIN B-12) 1000 MCG SUBL Place 1,000 mcg under the tongue daily.    [provider]  loperamide (IMODIUM A-D) 2 MG tablet Take 2 mg by mouth 4 (four) times daily as needed for diarrhea or loose stools.    [provider]  LORazepam  (ATIVAN ) 0.5 MG tablet Take 1 tablet (0.5 mg total) by mouth 2 (two) times daily as needed for anxiety. 08/27/24   Gudena, Vinay, MD  pyridOXINE (VITAMIN B6) 100 MG tablet Take 100 mg by mouth daily. 08/25/24   [provider]     Family History  Problem Relation Age of Onset   Breast cancer Mother 78   Colon cancer Mother 73   Asthma Mother    Hypertension Mother    Hearing loss Mother    Hypertension Father    Diabetes Father    Hyperlipidemia Father    Learning disabilities Son    ADD / ADHD Son    Ovarian cancer Maternal Grandmother     Hypertension Maternal Grandfather    Hyperlipidemia Maternal Grandfather    Heart disease Maternal Grandfather    Stroke Maternal Grandfather    Mental illness Paternal Grandmother    Hypertension Paternal Grandfather     Social History   Socioeconomic History   Marital status: Divorced    Spouse name: Not on file   Number of children: 2   Years of education: Not on file   Highest education level: Not on file  Occupational History   Not on file  Tobacco Use   Smoking status: Former    Current packs/day: 0.00    Average packs/day: 0.5 packs/day for 30.0 years (15.0 ttl pk-yrs)    Types: Cigarettes    Start date: 04/07/1991    Quit date: 04/06/2021    Years since quitting: 3.6   Smokeless tobacco: Never  Vaping Use   Vaping status: Every Day   Substances: Nicotine   Devices: jewel  (pt stated started in 2022)  Substance and Sexual Activity   Alcohol use: Not Currently   Drug use: Not Currently    Comment: 05-28-2024  CBD gummies , last time 2022   Sexual activity: Not Currently    Birth control/protection: None  Other Topics Concern   Not on file  Social History Narrative   2  sons: 21 y.o. and 66 y.o. : 68 y.o. son lives with pt. 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   Social Drivers of Health   Tobacco Use: Medium Risk (11/10/2024)   Patient History    Smoking Tobacco Use: Former    Smokeless Tobacco Use: Never    Passive Exposure: Not on file  Financial Resource Strain: Not on file  Food Insecurity: Food Insecurity Present (02/19/2024)   Hunger Vital Sign    Worried About Running Out of Food in the Last Year: Sometimes true    Ran Out of Food in the Last Year: Sometimes true  Transportation Needs: No Transportation Needs (02/19/2024)   PRAPARE - Administrator, Civil Service (Medical): No    Lack of Transportation (Non-Medical): No  Physical Activity: Not on file  Stress: Not on file  Social Connections: Not on file  Depression (PHQ2-9): Low  Risk (06/19/2024)   Depression (PHQ2-9)    PHQ-2 Score: 0  Recent Concern: Depression (PHQ2-9) - Medium Risk (04/21/2024)   Depression (PHQ2-9)    PHQ-2 Score: 8  Alcohol Screen: Not on file  Housing:  Unknown (05/16/2024)   Received from [facility]   Epic    Unable to Pay for Housing in the Last Year: Not on file    Number of Times Moved in the Last Year: Not on file    At any time in the past 12 months, were you homeless or living in a shelter (including now)?: No  Utilities: Not At Risk (02/19/2024)   [facility] Utilities    Threatened with loss of utilities: No  Health Literacy: Not on file    Vital Signs: There were no vitals taken for this visit.  Physical Exam Constitutional:      General: She is not in acute distress.    Appearance: Normal appearance.  HENT:     Head: Normocephalic and atraumatic.  Pulmonary:     Effort: Pulmonary effort is normal.  Neurological:     Mental Status: She is alert and oriented to person, place, and time.  Psychiatric:        Mood and Affect: Mood normal.        Behavior: Behavior normal.     Imaging: No results found.  Labs:  CBC: Recent Labs    08/27/24 0908 09/09/24 0841 10/22/24 0801 11/11/24 1045  WBC 3.2* 2.6* 1.8* 3.3*  HGB 11.9* 11.7* 12.3 12.6  HCT 34.2* 33.5* 35.7* 36.4  PLT 279 203 210 240    COAGS: Recent Labs    01/16/24 0819 08/13/24 0752 09/10/24 0814  INR 1.0 1.0 1.3*    BMP: Recent Labs    08/27/24 0908 09/09/24 0841 10/22/24 0801 11/11/24 1045  NA 139 139 141 137  K 4.2 3.8 3.7 4.1  CL 106 105 105 101  CO2 30 28 26 27   GLUCOSE 99 103* 105* 119*  BUN 13 12 9 11   CALCIUM 9.5 9.2 9.5 9.6  CREATININE 0.82 0.78 0.92 0.92  GFRNONAA >60 >60 >60 >60    LIVER FUNCTION TESTS: Recent Labs    08/27/24 0908 09/09/24 0841 10/22/24 0801 11/11/24 1045  BILITOT 0.2 0.3 0.5 0.3  AST 18 21 23 26   ALT 15 15 17 16   ALKPHOS 92 89 119 123  PROT 6.4* 6.7 7.1 7.3  ALBUMIN  3.8 4.0 4.3  4.5    Assessment/Plan:  54 year old female with a history of metastatic breast cancer with liver lesions. Patient underwent Y90 segementectomy on 11/5 with Dr. VEAR Lent and is doing well.  Recent PET CT shows no FDG avid disease.   1.) Liver MRI with gad in mid March as well as clinic visit.   Signed: Caitlyn M McInnis, PA-C 11/11/2024, 1:23 PM   This encounter was conducted via the [employer] providing interactive audio and visual communication.  The patient provided verbal consent to conduct a virtual appointment.  The patient was located at their primary residence during this encounter.  I spent a total of 15 Minutes in face to face in clinical consultation, greater than 50% of which was counseling/coordinating care for Y90 radioembolization follow up. "

## 2024-11-11 NOTE — Progress Notes (Signed)
 "  Patient Care Team: [name], MD as PCP - General (Hematology and Oncology) Obgyn, Anna as Consulting Physician (Obstetrics and Gynecology) Onetha Kuba, MD as Consulting Physician (Neurosurgery) Tyree Nanetta SAILOR, RN as Oncology Nurse Navigator [name], MD as Consulting Physician (Hematology and Oncology) Aron Shoulders, MD as Consulting Physician (General Surgery) Izell Domino, MD as Attending Physician (Radiation Oncology)  DIAGNOSIS:  Encounter Diagnosis  Name Primary?   Malignant neoplasm of upper-inner quadrant of left breast in female, estrogen receptor positive (HCC) Yes    SUMMARY OF ONCOLOGIC HISTORY: Oncology History  Malignant neoplasm of upper-inner quadrant of left female breast (HCC)  07/19/2023 Initial Diagnosis   Palpable left breast mass for 6 months.  Mammogram and ultrasound revealed large masses.  10:30 position: 4.3 cm with skin thickening and ulceration: Grade 2 IDC with high-grade DCIS ER 95%, PR 95%, Ki67 30%, HER2 2+ by IHC, FISH negative ratio 1.37; 5 o'clock position: 3.3 cm with calcifications spanning 6.7 cm, axilla negative, biopsy: Grade 3 IDC with necrosis ER 95% PR 90% Ki-67 60% HER2 1+ negative   07/26/2023 Cancer Staging   Staging form: Breast, AJCC 8th Edition - Clinical: Stage IIIB (cT4b, cN0, cM0, G3, ER+, PR+, HER2-) - Signed by [name], MD on 07/26/2023 Histologic grading system: 3 grade system   08/15/2023 - 12/27/2023 Chemotherapy   Patient is on Treatment Plan : BREAST ADJUVANT DOSE DENSE AC q14d / PACLitaxel  q7d     08/17/2023 Initial Biopsy   Liver biopsy: consistent with metastatic carcinoma, breast primary.     08/21/2023 Cancer Staging   Staging form: Breast, AJCC 8th Edition - Pathologic: Stage IV (pM1) - Signed by Crawford Morna Pickle, NP on 08/21/2023     CHIEF COMPLIANT: Follow-up to discuss results of PET CT scan  HISTORY OF PRESENT ILLNESS: Discussed the use of AI scribe software for clinical note  transcription with the patient, who gave verbal consent to proceed.  History of Present Illness Ann Daugherty is a 54 year old woman with estrogen receptor-positive, HER2-negative metastatic invasive ductal carcinoma of the left breast who presents for follow-up and review of recent PET scan results.  She has no new malignancy-related symptoms. She is relieved that her recent PET scan showed no active hepatic disease and that the prior hepatic lesion was characterized as post-procedural inflammation without need for surgery or referral to [facility].  She continues abemaciclib  50 mg and anastrozole . About two hours after abemaciclib  she develops nausea with occasional emesis but no diarrhea. She remains adherent and otherwise tolerates treatment. Recent labs showed normal white blood cell count, neutrophils, hemoglobin, and platelets.  She notes gradual rise in fasting glucose into the low 100s and plans dietary changes, including a nutrition class.  She reports urinary urgency and frequency. Urinalysis showed rare bacteria, protein, and ketones. She has increased cranberry intake and hydration to about 64 ounces of water daily and may increase further. She denies dysuria, hematuria, or other infectious symptoms and is not significantly uncomfortable.      ALLERGIES:  has no known allergies.  MEDICATIONS:  Current Outpatient Medications  Medication Sig Dispense Refill   abemaciclib  (VERZENIO ) 50 MG tablet Take 1 tablet (50 mg total) by mouth 2 (two) times daily. 60 tablet 3   acetaminophen  (TYLENOL ) 500 MG tablet Take 2 tablets (1,000 mg total) by mouth every 6 (six) hours. 60 tablet 0   anastrozole  (ARIMIDEX ) 1 MG tablet Take 1 tablet (1 mg total) by mouth daily. 90 tablet 3  calcium carbonate (TUMS - DOSED IN MG ELEMENTAL CALCIUM) 500 MG chewable tablet Chew 1 tablet by mouth daily as needed (low calcium).     Cholecalciferol (VITAMIN D3) 5000 units CAPS Take 5,000 Units by mouth  daily.     citalopram  (CELEXA ) 40 MG tablet Take 1 tablet (40 mg total) by mouth daily. 90 tablet 1   Cranberry 500 MG TABS Take 1 tablet by mouth 3 (three) times a week.     Cyanocobalamin (VITAMIN B-12) 1000 MCG SUBL Place 1,000 mcg under the tongue daily.     loperamide (IMODIUM A-D) 2 MG tablet Take 2 mg by mouth 4 (four) times daily as needed for diarrhea or loose stools.     LORazepam  (ATIVAN ) 0.5 MG tablet Take 1 tablet (0.5 mg total) by mouth 2 (two) times daily as needed for anxiety. 30 tablet 3   pyridOXINE (VITAMIN B6) 100 MG tablet Take 100 mg by mouth daily.     No current facility-administered medications for this visit.    PHYSICAL EXAMINATION: ECOG PERFORMANCE STATUS: 1 - Symptomatic but completely ambulatory  Vitals:   11/11/24 1118  BP: 102/68  Pulse: 73  Resp: 18  Temp: 98 F (36.7 C)  SpO2: 100%   Filed Weights   11/11/24 1118  Weight: 137 lb 9.6 oz (62.4 kg)     LABORATORY DATA:  I have reviewed the data as listed    Latest Ref Rng & Units 11/11/2024   10:45 AM 10/22/2024    8:01 AM 09/09/2024    8:41 AM  CMP  Glucose 70 - 99 mg/dL 880  894  896   BUN 6 - 20 mg/dL 11  9  12    Creatinine 0.44 - 1.00 mg/dL 9.07  9.07  9.21   Sodium 135 - 145 mmol/L 137  141  139   Potassium 3.5 - 5.1 mmol/L 4.1  3.7  3.8   Chloride 98 - 111 mmol/L 101  105  105   CO2 22 - 32 mmol/L 27  26  28    Calcium 8.9 - 10.3 mg/dL 9.6  9.5  9.2   Total Protein 6.5 - 8.1 g/dL 7.3  7.1  6.7   Total Bilirubin 0.0 - 1.2 mg/dL 0.3  0.5  0.3   Alkaline Phos 38 - 126 U/L 123  119  89   AST 15 - 41 U/L 26  23  21    ALT 0 - 44 U/L 16  17  15      Lab Results  Component Value Date   WBC 3.3 (L) 11/11/2024   HGB 12.6 11/11/2024   HCT 36.4 11/11/2024   MCV 94.1 11/11/2024   PLT 240 11/11/2024   NEUTROABS 2.6 11/11/2024    ASSESSMENT & PLAN:  Malignant neoplasm of upper-inner quadrant of left female breast (HCC) 07/19/2023:Palpable left breast mass for 6 months.  Mammogram and  ultrasound revealed large masses.   10:30 position: 4.3 cm with skin thickening and ulceration: Grade 2 IDC with high-grade DCIS ER 95%, PR 95%, Ki67 30%, HER2 2+ by IHC, FISH negative ratio 1.37;  5 o'clock position: 3.3 cm with calcifications spanning 6.7 cm, axilla negative, biopsy: Grade 3 IDC with necrosis ER 95% PR 90% Ki-67 60% HER2 1+ negative   CT CAP 08/03/2023: 2 prominent left breast masses, left axillary lymph nodes, right hepatic lobe lesion concerning for metastatic breast cancer Ultrasound a liver biopsy scheduled for 08/17/2023   Treatment plan: Neoadjuvant chemotherapy with dose Adriamycin  and Cytoxan  followed  by Taxol  completed 12/27/2023 Mastectomy left breast 01/24/2024 Adjuvant radiation completed 04/16/24 Liver directed therapy (underwent microwave ablation 01/16/2024) Antiestrogen therapy with CDK inhibitors started 06/25/24 ---------------------------------------------------------------------------------------------------- MRI liver 12/26/2023: Significantly diminished size of hepatic metastases (0.6 cm, 1.4 cm, (used to be 3.6 cm), 0.7 cm) MRI breast 12/31/2023: Significant positive response to chemo.  Both left malignancies decrease in size (3 cm with enhancement) and 1.1 cm CT CAP 12/26/2023: Significant decrease in breast masses, resolution of left axillary lymph nodes, decrease liver metastases   01/24/24: Bilateral Mastectomies Right: Benign Left: 2 residual tumors 2.8 cm and 1.6 cm, Margins neg, DCIS, ER 85%, PR: 25%, Her 2: 1+ Neg, Ki 67: 10% 06/04/2023: Hysterectomy and bilateral salpingo-oophorectomy    Current treatment:: Anti-estrogen therapy with Verzenio  started 06/26/2024 Adverse effects: Mild diarrhea: Managing well. Will keep her at 100 mg bid dose Neuropenia: Monitoring for now Severe fatigue: Advised to take B 12 supplements   Liver MRI 04/12/2024: Stable 6 mm subcapsular left hepatic dome lesion similar, wedge-shaped deformity right hepatic lobe 1 x 2 cm  without enhancement (treated metastatic lesion)   09/08/2024: embolization procedure with Dr. Karalee 10/22/2024: CT CAP: Interval increase in the ill-defined lesion left hepatic dome 1.5 x 3.1 cm compared to 1 x 1.4 cm Plan: PET/CT scan 11/07/2024: No focal metabolic activity in the liver.  Low-attenuation superior right hepatic lobe due to prior embolization no other metastatic disease I will discuss with Dr. Aron to hold off on any liver resection. Reduce the dosage of Verzinio to 50 p.o. twice daily because of drop in white blood cell count      No orders of the defined types were placed in this encounter.  The patient has a good understanding of the overall plan. she agrees with it. she will call with any problems that may develop before the next visit here.  I personally spent a total of 30 minutes in the care of the patient today including preparing to see the patient, getting/reviewing separately obtained history, performing a medically appropriate exam/evaluation, counseling and educating, placing orders, referring and communicating with other health care professionals, documenting clinical information in the EHR, independently interpreting results, communicating results, and coordinating care.   Ann K Alix Stowers, MD 11/11/2024    "

## 2024-11-12 ENCOUNTER — Other Ambulatory Visit: Payer: Self-pay | Admitting: *Deleted

## 2024-11-12 LAB — CANCER ANTIGEN 27.29: CA 27.29: 24.4 U/mL (ref 0.0–38.6)

## 2024-11-12 NOTE — Progress Notes (Signed)
 The proposed treatment discussed in conference is for discussion purpose only and is not a binding recommendation.  The patients have not been physically examined, or presented with their treatment options.  Therefore, final treatment plans cannot be decided.

## 2024-11-13 ENCOUNTER — Ambulatory Visit

## 2024-11-13 LAB — URINE CULTURE: Culture: 10000 — AB

## 2024-11-17 ENCOUNTER — Ambulatory Visit

## 2024-11-18 ENCOUNTER — Other Ambulatory Visit: Payer: Self-pay

## 2024-11-20 ENCOUNTER — Ambulatory Visit

## 2024-11-20 ENCOUNTER — Other Ambulatory Visit (HOSPITAL_COMMUNITY): Payer: Self-pay

## 2024-11-20 DIAGNOSIS — C50112 Malignant neoplasm of central portion of left female breast: Secondary | ICD-10-CM

## 2024-11-20 DIAGNOSIS — M25612 Stiffness of left shoulder, not elsewhere classified: Secondary | ICD-10-CM

## 2024-11-20 DIAGNOSIS — R600 Localized edema: Secondary | ICD-10-CM

## 2024-11-20 DIAGNOSIS — Z483 Aftercare following surgery for neoplasm: Secondary | ICD-10-CM | POA: Diagnosis not present

## 2024-11-20 NOTE — Therapy (Addendum)
 " OUTPATIENT PHYSICAL THERAPY ONCOLOGY TREATMENT  Patient Name: Ann Daugherty MRN: [id number] DOB:1971/03/24, 54 y.o., female Today's Date: 11/20/2024  END OF SESSION:  PT End of Session - 11/20/24 1106     Visit Number 4    Number of Visits 12    Date for Recertification  12/04/24    PT Start Time 1102    PT Stop Time 1158    PT Time Calculation (min) 56 min    Activity Tolerance Patient tolerated treatment well    Behavior During Therapy [facility] for tasks assessed/performed          Past Medical History:  Diagnosis Date   ADD (attention deficit disorder)    Anemia    Breast cancer metastasized to liver, unspecified laterality (HCC) 08/2023   01-16-2024 s/p microwave ablation large liver lesion w/ residual small lesion   Depression    GAD (generalized anxiety disorder)    GERD (gastroesophageal reflux disease)    History of antineoplastic chemotherapy 08/2023   08-15-2023  to  20-20-2025   History of external beam radiation therapy 01/24/2024   left chest wall   s/p bilateral mastectomy   01-24-2024  to  04-16-2024   Malignant neoplasm of upper-inner quadrant of left breast in female, estrogen receptor positive (HCC) 07/2023   oncologist-- dr catrina [facility]yn oncology--- dr forsyth/ surgeon -- dr aron;  dx 07/2023  HGDCIS/ ICDG2; liver bx confirmed mets ; Stage IV;  completed chemo 02/ 2025; 03/ 2025 s/p bilateral mastectomy; completed IMRT 04-16-2024; scheduled for Hystectomy w/ BSO 06-03-2024   Pap smear abnormality of cervix with LGSIL 05/20/2024   w/ + HPV   PONV (postoperative nausea and vomiting)    Port-A-Cath in place 08/14/2023   Past Surgical History:  Procedure Laterality Date   BREAST BIOPSY Left 07/19/2023   US  LT BREAST BX W LOC DEV EA ADD LESION IMG BX SPEC US  GUIDE 07/19/2023 [facility]   BREAST BIOPSY Left 07/19/2023   US  LT BREAST BX W LOC DEV 1ST LESION IMG BX SPEC US  GUIDE 07/19/2023 [facility]   BREAST BIOPSY Right 08/09/2023   US  RT  BREAST BX W LOC DEV 1ST LESION IMG BX SPEC US  GUIDE 08/09/2023 [facility]   BREAST BIOPSY Right 08/09/2023   US  RT BREAST BX W LOC DEV EA ADD LESION IMG BX SPEC US  GUIDE 08/09/2023 [facility]   CYSTOSCOPY N/A 06/03/2024   Procedure: CYSTOSCOPY;  Surgeon: Erik Kieth BROCKS, MD;  Location: [facility] OR;  Service: Gynecology;  Laterality: N/A;   DILATION AND CURETTAGE OF UTERUS  1990   IR ANGIOGRAM VISCERAL SELECTIVE  08/13/2024   IR EMBO TUMOR ORGAN ISCHEMIA INFARCT INC GUIDE ROADMAPPING  09/10/2024   IR GUIDED LIVER TUMOR ABLATION RFA PERC     IR RADIOLOGIST EVAL & MGMT  01/08/2024   IR RADIOLOGIST EVAL & MGMT  01/31/2024   IR RADIOLOGIST EVAL & MGMT  04/17/2024   IR RADIOLOGIST EVAL & MGMT  07/29/2024   IR RADIOLOGIST EVAL & MGMT  09/30/2024   IR RADIOLOGIST EVAL & MGMT  11/11/2024   LAMINECTOMY AND MICRODISCECTOMY LUMBAR SPINE  2011   by dr onetha;   re-do L4--5   LUMBAR LAMINECTOMY/DECOMPRESSION WITH DISCECTOMY  09/17/2006   @[facility]  by dr onetha;   L4-5   PORTACATH PLACEMENT Right 08/14/2023   Procedure: INSERTION PORT-A-CATH WITH ULTRASOUND ANDREA;  Surgeon: Aron Shoulders, MD;  Location: [facility] OR;  Service: General;  Laterality: Right;   SIMPLE MASTECTOMY WITH AXILLARY SENTINEL NODE  BIOPSY Bilateral 01/24/2024   Procedure: BILATERAL MASTECTOMY;  Surgeon: Aron Shoulders, MD;  Location: [facility];  Service: General;  Laterality: Bilateral;  PEC BLOCK   TOTAL LAPAROSCOPIC HYSTERECTOMY WITH BILATERAL SALPINGO OOPHORECTOMY Bilateral 06/03/2024   Procedure: HYSTERECTOMY, TOTAL, LAPAROSCOPIC, WITH BILATERAL SALPINGO-OOPHORECTOMY;  Surgeon: Erik Kieth BROCKS, MD;  Location: [facility] OR;  Service: Gynecology;  Laterality: Bilateral;  TLH/BSO/cysto   Patient Active Problem List   Diagnosis Date Noted   Liver lesion 08/12/2024   Estrogen receptor positive 06/03/2024   Primary cancer of upper inner quadrant of left breast (HCC) 01/24/2024   Port-A-Cath in place 08/15/2023   Malignant  neoplasm of upper-inner quadrant of left female breast (HCC) 07/20/2023   Depression, major, single episode, mild 04/05/2021   Tobacco abuse 01/30/2018   Anxiety 01/30/2018   Attention deficit disorder 01/30/2018   Vitamin D  deficiency 01/30/2018      REFERRING PROVIDER: Shoulders Aron  REFERRING DIAG: LEFT shoulder stiffness  THERAPY DIAG:  Aftercare following surgery for neoplasm  Stiffness of left shoulder, not elsewhere classified  Malignant neoplasm of central portion of left female breast, unspecified estrogen receptor status (HCC)  Localized edema  ONSET DATE: 1 month ago  Rationale for Evaluation and Treatment: Rehabilitation  SUBJECTIVE:                                                                                                                                                                                           SUBJECTIVE STATEMENT: I got really sick after I was here last. When they did my labs last week it showed e coli and I got so sick over the weekend. I had a fever up to 103 degrees and was vomiting and just felt really sick for 5 days. The doctor didn't want to prescribe me an antibiotic because my numbers had just come back up and he thought I could get through it, but man I was sick. So I am feeling tighter today because I couldn't do much while I was sick. My Rt lateral trunk and across my Lt chest from the sternum to my anterior shoulder is where I feel the tightness.     PERTINENT HISTORY:   Patient presented with new diagnosis of left breast cancer 07/2023 Core needle biopsies were performed. The 5 o'clock mass had grade 3 invasive ductal carcinoma that is ER/PR positive, her 2 negative, and Ki 67 60%. The 10:30 mass was grade 2 invasive ductal carcinoma, Er/PR +, her 2 negative and Ki 67 30%. Pt had neoadjuvant chemotherapy during which she had about 30# weight loss.  She had metastasis to liver and underwent a  liver ablation 01/16/2024. Pt had bilateral  mastectomy on 01/24/24 with no lymph nodes removed.  She has had radiation. She underwent a Y90 treatment for a liver MET on 09/10/2024. She is also s/p a hysterectomy and salpingo-oophorectomy in July 2024   PAIN:  Are you having pain? Not pain, but tight NPRS scale: 7/10 Pain location: left chest, axilla, pecs Pain orientation: Left and Right PAIN TYPE: aching and tight Pain description: intermittent  Aggravating factors: from being sick Relieving factors: not reaching to extremes  PRECAUTIONS: Left breast cancer;NO LN's removed  WEIGHT BEARING RESTRICTIONS: No  FALLS:  Has patient fallen in last 6 months? Yes. Number of falls 1  LIVING ENVIRONMENT: Lives with: lives with her sons when not at school Lives in: House/apartment Stairs: Yes; Internal: 12 steps; on left going up  OCCUPATION: not working  LEISURE: pickleball, gym, hiking  HAND DOMINANCE: right   PRIOR LEVEL OF FUNCTION: Independent  PATIENT GOALS: increase ROM, reduce tightness   OBJECTIVE: Note: Objective measures were completed at Evaluation unless otherwise noted.  COGNITION: Overall cognitive status: Within functional limits for tasks assessed   PALPATION: Vender left pectorals, left axilla, bilateral drain sites  OBSERVATIONS / OTHER ASSESSMENTS: Mild swelling noted Right lateral chest, incisions nicely healed B, hyperpigmentation from radiation on left  SENSATION: Light touch: Deficits    POSTURE: forward head and shoulders . Left ribs rotated forward and prominent in standing   UPPER EXTREMITY AROM/PROM:  A/PROM RIGHT   eval   Shoulder extension 68  Shoulder flexion 165  Shoulder abduction 176  Shoulder internal rotation 70  Shoulder external rotation 105    (Blank rows = not tested)  A/PROM LEFT   eval  Shoulder extension 42  Shoulder flexion 155  Shoulder abduction 108  Shoulder internal rotation 68  Shoulder external rotation 88    (Blank rows = not tested)  CERVICAL  AROM: All within normal limits:     UPPER EXTREMITY STRENGTH: WFL   LYMPHEDEMA ASSESSMENTS:   SURGERY TYPE/DATE: bilateral mastectomy on 01/24/24  NUMBER OF LYMPH NODES REMOVED: 0  CHEMOTHERAPY: Yes, Neoadjuvant  RADIATION:YES  HORMONE TREATMENT: YES    QUICK DASH SURVEY: 23%                                                                                                                             TREATMENT DATE:  11/20/24: Therapeutic Exercises Pulleys into flex and abd x 3 min total Roll yellow ball up wall into flex and bil abd x 10 each Therapeutic Activities Supine over half foam roll for following: Bil UE abd in a snow angel x 10 with 5 sec holds; bil UE scaption in a V x 10; bil UE horz abd x 5; then bil S/L for open book stretch x 10 each side with LE on foam roll Manual Therapy MLD to Rt lateral trunk: Rt inguinal nodes and Rt axillo-inguinal anastomosis and then focused on area of edema at  Rt lateral trunk P/ROM to bil shoulders in supine into flex, abd and D2 with scapular depression by therapist throughout STM to tightness across Lt pectoralis from origin to insertion, then to Rt lateral trunk  MFR to bil chest wall with horizontal cross hands technique and mastectomy incisions  11/10/24: Therapeutic Exercises Pulleys into flex x 1 min, and then x 2 min with VC's throughout to decrease scap compensation Roll yellow ball up wall into Lt UE abd x 10 returning therapist demo Therapeutic Activities Supine over full foam roll for following: Bil UE horz abd x 10, bil UE scaption into a V x 10, then bil UE abd in a snow angel x 10 with 5 sec holds; pt reports feeling very good stretch with these and VC's during for core engagement for stability Manual Therapy MLD to Rt lateral trunk: Short neck, superficial and deep abdominals, Rt inguinal nodes and Rt axillo-inguinal anastomosis and then focused on area of edema at Rt lateral trunk P/ROM to Lt shoulder in  supine into flex, abd and D2 with scapular depression by therapist throughout STM to tightness at Lt lateral trunk and at pectoralis MFR to Lt chest wall and mastectomy incision, also at pect tendon tightness at end ROMs  11/03/2024  Pulleys flexion, scaption, abd x 2 min ea, VC's to depress scapula and relax arm she is stretching Ball rolls on wall flexion x 5, abd x 5 ea Supine wand flexion and scaption x 4 ea LTR with arms outstretched x 3 B Snow angels 5 x 10 sec MFR to left chest region STM to left UT, pectorals, lateral trunk and left scapular area with cocoa butter PROM Left shoulder Flex, scaption, abd, ER.    PATIENT EDUCATION:  Education details: Access Code: [email or web address] URL: [email or web address] Date: 10/23/2024 Prepared by: Grayce Sheldon  Exercises - Supine Shoulder Flexion/scaption with Dowel  - 2 x daily - 7 x weekly - 1 sets - 5 reps - 5 hold - Single Arm Doorway Pec Stretch at 90 Degrees Abduction  - 1 x daily - 7 x weekly - 1 sets - 3 reps - 20-30 hold - Doorway Pec Stretch at 90 Degrees Abduction  - 1 x daily - 7 x weekly - 1 sets - 3 reps - 20-30 hold Supine stargazer, and wall stretch for abd Person educated: Patient Education method: Explanation, Demonstration, and Handouts Education comprehension: verbalized understanding, returned demonstration, and verbal cues required  HOME EXERCISE PROGRAM:  Supine Shoulder Flexion/scaption with Dowel  - 2 x daily - 7 x weekly - 1 sets - 5 reps - 5 hold - Single Arm Doorway Pec Stretch at 90 Degrees Abduction  - 1 x daily - 7 x weekly - 1 sets - 3 reps - 20-30 hold - Doorway Pec Stretch at 90 Degrees Abduction  - 1 x daily - 7 x weekly - 1 sets - 3 reps - 20-30 hold Supine stargazer, and wall stretch for abd  ASSESSMENT:  CLINICAL IMPRESSION: Pt has been really sick with a UTI and e coli which caused a high fever since she was here last. Due to this she was feeling tighter today, so continued with focus  on stretching bil upper quadrant stretching with AA/A/ROM stretches and manual therapy. Pt reports feeling much looser at end of session and now that she's feeling better will be able to get back to her regular stretches. Pt will benefit from continued physical therapy to further reduce her bil upper quadrant tightness improving her end  ROM reach with bil shoulders.    EVAL Patient is a 54 y.o. female who was seen today for physical therapy evaluation and treatment for complaints of left shoulder tightness with pain, and right lateral trunk swelling. She is s/p bilateral Mastectomies with No LN removal, but did have radiation on the left. She has stage IV breast cancer, but had recent procedures on her liver to treat the metastasis. She has not had results from CT yet. She was educated in shoulder ROM and chest stretching to start performing at home to help improve ROM/tightness. She will benefit from skilled PT to address deficits in ROM/flexibility, swelling and return to PLOF.  OBJECTIVE IMPAIRMENTS: decreased activity tolerance, decreased knowledge of condition, decreased ROM, decreased strength, increased edema, impaired flexibility, impaired UE functional use, postural dysfunction, and pain.   ACTIVITY LIMITATIONS: reach over head and hygiene/grooming  PARTICIPATION LIMITATIONS: doing all but limited with reaching with left UE  PERSONAL FACTORS: Stage IV breast cancer s/p bilateral Mastectomies, chemo and radiation are also affecting patient's functional outcome.   REHAB POTENTIAL: Good  CLINICAL DECISION MAKING: Stable/uncomplicated  EVALUATION COMPLEXITY: Low  GOALS: Goals reviewed with patient? Yes  SHORT TERM GOALS: Target date: 12/05/2023  Pt will be independent in a HEP to improve left shoulder/chest ROM Baseline: Goal status: INITIAL  2.  Pt will have left shoulder abduction atleast 160 degrees for improved ability to perform hair care and lateral reaching Baseline:  Goal  status: INITIAL  3.  Pt will have left shoulder flexion atleast 162 degrees for ability to reach into overhead cabinets Baseline:  Goal status: INITIAL  4.  Pt will have quick dash decreased to no greater than 10 percent to demonstrate improved function Baseline:  Goal status: INITIAL  5. Pt will be independent with MLD for right chest swelling Baseline:  Goal status: INITIAL  PLAN:  PT FREQUENCY: 2x/week  PT DURATION: 6 weeks  PLANNED INTERVENTIONS: 97164- PT Re-evaluation, 97750- Physical Performance Testing, 97110-Therapeutic exercises, 97530- Therapeutic activity, [id number]- Neuromuscular re-education, 97535- Self Care, [id number]- Manual therapy, 97760- Orthotic Initial, and [id number]- Orthotic/Prosthetic subsequent  PLAN FOR NEXT SESSION: Review HEP,  add lat trunk stretches, STM to UT/pecs, lat stretch,PROM, pulleys, LTR, cont with foam roll and try laying to it horizontal as well on Rt side for Lt OH reach/lateral trunk stretch PHYSICAL THERAPY DISCHARGE SUMMARY  Visits from Start of Care: 4  Current functional level related to goals / functional outcomes: Unknown, pt did not return   Remaining deficits: Unknown   Education / Equipment: HEP   Patient agrees to discharge. Patient goals were not assessed. Patient is being discharged due to not returning since the last visit. Pt was called severl times regarding attendance but did not return our call.   Aden Berwyn Caldron, PTA 11/20/2024, 12:59 PM  Grayce Sheldon, PT 12/05/24 1:12 PM   "

## 2024-11-21 ENCOUNTER — Other Ambulatory Visit (HOSPITAL_COMMUNITY): Payer: Self-pay

## 2024-11-21 ENCOUNTER — Other Ambulatory Visit: Payer: Self-pay

## 2024-11-21 ENCOUNTER — Other Ambulatory Visit: Payer: Self-pay | Admitting: Adult Health

## 2024-11-21 MED ORDER — CITALOPRAM HYDROBROMIDE 40 MG PO TABS
40.0000 mg | ORAL_TABLET | Freq: Every day | ORAL | 1 refills | Status: AC
  Filled 2024-11-21: qty 90, 90d supply, fill #0

## 2024-11-21 NOTE — Progress Notes (Signed)
 Specialty Pharmacy Refill Coordination Note  MyChart Questionnaire Submission  Ann Daugherty is a 54 y.o. female contacted today regarding refills of specialty medication(s) Verzenio .  Doses on hand: (Patient-Rptd) 7 days or 14 pills   Patient requested: (Patient-Rptd) Pickup at [facility] at [facility] date: 11/25/24  Medication will be filled on 11/24/24

## 2024-11-24 ENCOUNTER — Inpatient Hospital Stay: Admitting: Pharmacist

## 2024-11-24 ENCOUNTER — Other Ambulatory Visit: Payer: Self-pay

## 2024-11-24 ENCOUNTER — Inpatient Hospital Stay

## 2024-11-24 ENCOUNTER — Ambulatory Visit

## 2024-11-24 NOTE — Progress Notes (Signed)
 Clinical Intervention Note  Clinical Intervention Notes: Patient reported starting D-Mannose supplement. No DDIs identified with Verzenio .   Clinical Intervention Outcomes: Prevention of an adverse drug event   Advertising Account Planner

## 2024-11-25 ENCOUNTER — Other Ambulatory Visit (HOSPITAL_COMMUNITY): Payer: Self-pay

## 2024-11-25 ENCOUNTER — Telehealth: Payer: Self-pay

## 2024-11-25 ENCOUNTER — Other Ambulatory Visit: Payer: Self-pay | Admitting: Hematology and Oncology

## 2024-11-25 DIAGNOSIS — C50212 Malignant neoplasm of upper-inner quadrant of left female breast: Secondary | ICD-10-CM

## 2024-11-25 MED ORDER — ONDANSETRON HCL 8 MG PO TABS
8.0000 mg | ORAL_TABLET | Freq: Three times a day (TID) | ORAL | 1 refills | Status: AC | PRN
  Filled 2024-11-25: qty 30, 10d supply, fill #0

## 2024-11-25 NOTE — Telephone Encounter (Signed)
 Received RX refill request for pts zofran . RN reached out to pt to see if she was still needing it since it had been denied for being no longer appropriate when last refill request came in. Pt reports that she requested that pharmacy reach out to us  because she's been having increased nausea w/ her verzenio  50mg . Per Odean MD okay to refill.

## 2024-11-26 ENCOUNTER — Telehealth: Payer: Self-pay

## 2024-11-26 NOTE — Telephone Encounter (Signed)
 GI Tumor Boards  Ann Daugherty was presented on the GI tumor board on 11/26/2024. Ann Daugherty has a personal history of breast cancer with metastasis to liver.  Ann Daugherty meets National Comprehensive Cancer Network (NCCN) criteria for genetic testing for Hereditary Breast and Ovarian Cancer Syndrome based on her personal and family history. Her family history includes breast and colon cancer in her mother and ovarian cancer in her maternal grandmother. She has previously been referred to genetics and her chart notes that family member have completed genetic testing and screened negative.   Ann Fryer, MS, CGC  Certified Genetic Counselor  Email: [email or web address]  Phone: [phone]

## 2024-11-27 ENCOUNTER — Ambulatory Visit

## 2024-11-28 ENCOUNTER — Inpatient Hospital Stay: Admitting: Licensed Clinical Social Worker

## 2024-12-01 ENCOUNTER — Ambulatory Visit

## 2024-12-04 ENCOUNTER — Ambulatory Visit

## 2024-12-05 ENCOUNTER — Telehealth: Payer: Self-pay

## 2024-12-05 NOTE — Telephone Encounter (Signed)
 Pt was called after her last 3 no show appts with our clinic, including a phone call yesterday. Was able to LVM 2x, and 1 time unable as VM was full. Pt does not have future appts with our clinic at this time and will be D/C as we have not heard back from her at this time.

## 2024-12-08 ENCOUNTER — Other Ambulatory Visit (HOSPITAL_COMMUNITY): Payer: Self-pay

## 2024-12-11 ENCOUNTER — Other Ambulatory Visit: Payer: Self-pay | Admitting: Interventional Radiology

## 2024-12-11 ENCOUNTER — Inpatient Hospital Stay: Attending: Adult Health

## 2024-12-11 ENCOUNTER — Inpatient Hospital Stay: Admitting: Pharmacist

## 2024-12-11 VITALS — BP 94/52 | HR 62 | Temp 97.7°F | Resp 17 | Ht 66.0 in | Wt 134.6 lb

## 2024-12-11 DIAGNOSIS — C50212 Malignant neoplasm of upper-inner quadrant of left female breast: Secondary | ICD-10-CM

## 2024-12-11 DIAGNOSIS — C787 Secondary malignant neoplasm of liver and intrahepatic bile duct: Secondary | ICD-10-CM

## 2024-12-11 LAB — CBC WITH DIFFERENTIAL (CANCER CENTER ONLY)
Abs Immature Granulocytes: 0.01 10*3/uL (ref 0.00–0.07)
Basophils Absolute: 0 10*3/uL (ref 0.0–0.1)
Basophils Relative: 1 %
Eosinophils Absolute: 0.1 10*3/uL (ref 0.0–0.5)
Eosinophils Relative: 2 %
HCT: 35.4 % — ABNORMAL LOW (ref 36.0–46.0)
Hemoglobin: 12.3 g/dL (ref 12.0–15.0)
Immature Granulocytes: 0 %
Lymphocytes Relative: 18 %
Lymphs Abs: 0.4 10*3/uL — ABNORMAL LOW (ref 0.7–4.0)
MCH: 32 pg (ref 26.0–34.0)
MCHC: 34.7 g/dL (ref 30.0–36.0)
MCV: 92.2 fL (ref 80.0–100.0)
Monocytes Absolute: 0.2 10*3/uL (ref 0.1–1.0)
Monocytes Relative: 10 %
Neutro Abs: 1.6 10*3/uL — ABNORMAL LOW (ref 1.7–7.7)
Neutrophils Relative %: 69 %
Platelet Count: 218 10*3/uL (ref 150–400)
RBC: 3.84 MIL/uL — ABNORMAL LOW (ref 3.87–5.11)
RDW: 12.3 % (ref 11.5–15.5)
WBC Count: 2.3 10*3/uL — ABNORMAL LOW (ref 4.0–10.5)
nRBC: 0 % (ref 0.0–0.2)

## 2024-12-11 LAB — CMP (CANCER CENTER ONLY)
ALT: 15 U/L (ref 0–44)
AST: 21 U/L (ref 15–41)
Albumin: 4.2 g/dL (ref 3.5–5.0)
Alkaline Phosphatase: 112 U/L (ref 38–126)
Anion gap: 9 (ref 5–15)
BUN: 10 mg/dL (ref 6–20)
CO2: 28 mmol/L (ref 22–32)
Calcium: 9.5 mg/dL (ref 8.9–10.3)
Chloride: 102 mmol/L (ref 98–111)
Creatinine: 0.86 mg/dL (ref 0.44–1.00)
GFR, Estimated: >60 mL/min
Glucose, Bld: 94 mg/dL (ref 70–99)
Potassium: 4 mmol/L (ref 3.5–5.1)
Sodium: 139 mmol/L (ref 135–145)
Total Bilirubin: 0.3 mg/dL (ref 0.0–1.2)
Total Protein: 7 g/dL (ref 6.5–8.1)

## 2024-12-12 ENCOUNTER — Other Ambulatory Visit: Payer: Self-pay

## 2024-12-12 LAB — CANCER ANTIGEN 27.29: CA 27.29: 17.5 U/mL (ref 0.0–38.6)

## 2025-01-02 ENCOUNTER — Inpatient Hospital Stay: Admitting: General Practice

## 2025-01-12 ENCOUNTER — Inpatient Hospital Stay: Attending: Adult Health

## 2025-01-12 ENCOUNTER — Inpatient Hospital Stay: Admitting: Hematology and Oncology

## 2025-02-05 ENCOUNTER — Inpatient Hospital Stay: Admitting: Pharmacist

## 2025-02-05 ENCOUNTER — Inpatient Hospital Stay: Attending: Adult Health
